# Patient Record
Sex: Female | Born: 1935 | Race: White | Hispanic: No | Marital: Married | Smoking: Former smoker
Health system: Southern US, Community
[De-identification: ages and names within clinical notes are randomized; demographics above are authoritative.]

## PROBLEM LIST (undated history)

## (undated) DIAGNOSIS — E785 Hyperlipidemia, unspecified: Secondary | ICD-10-CM

## (undated) DIAGNOSIS — A422 Cervicofacial actinomycosis: Secondary | ICD-10-CM

## (undated) DIAGNOSIS — Z9289 Personal history of other medical treatment: Secondary | ICD-10-CM

## (undated) DIAGNOSIS — J449 Chronic obstructive pulmonary disease, unspecified: Secondary | ICD-10-CM

## (undated) DIAGNOSIS — I2510 Atherosclerotic heart disease of native coronary artery without angina pectoris: Secondary | ICD-10-CM

## (undated) DIAGNOSIS — I739 Peripheral vascular disease, unspecified: Secondary | ICD-10-CM

## (undated) DIAGNOSIS — I10 Essential (primary) hypertension: Secondary | ICD-10-CM

## (undated) HISTORY — DX: Chronic obstructive pulmonary disease, unspecified: J44.9

## (undated) HISTORY — DX: Hyperlipidemia, unspecified: E78.5

## (undated) HISTORY — DX: Cervicofacial actinomycosis: A42.2

## (undated) HISTORY — DX: Atherosclerotic heart disease of native coronary artery without angina pectoris: I25.10

## (undated) HISTORY — PX: BONE GRAFT HIP ILIAC CREST: SUR159

## (undated) HISTORY — PX: APPENDECTOMY: SHX54

## (undated) HISTORY — DX: Personal history of other medical treatment: Z92.89

## (undated) HISTORY — DX: Peripheral vascular disease, unspecified: I73.9

## (undated) HISTORY — DX: Essential (primary) hypertension: I10

## (undated) HISTORY — PX: TONSILLECTOMY: SUR1361

---

## 2004-03-03 ENCOUNTER — Encounter: Admission: RE | Admit: 2004-03-03 | Discharge: 2004-03-03 | Payer: Self-pay | Admitting: General Surgery

## 2005-02-12 ENCOUNTER — Emergency Department (HOSPITAL_COMMUNITY): Admission: EM | Admit: 2005-02-12 | Discharge: 2005-02-12 | Payer: Self-pay | Admitting: Family Medicine

## 2005-02-18 ENCOUNTER — Encounter: Admission: RE | Admit: 2005-02-18 | Discharge: 2005-02-18 | Payer: Self-pay | Admitting: Urology

## 2005-02-23 ENCOUNTER — Encounter (INDEPENDENT_AMBULATORY_CARE_PROVIDER_SITE_OTHER): Payer: Self-pay | Admitting: *Deleted

## 2005-02-23 ENCOUNTER — Ambulatory Visit (HOSPITAL_COMMUNITY): Admission: RE | Admit: 2005-02-23 | Discharge: 2005-02-23 | Payer: Self-pay | Admitting: Urology

## 2005-02-23 ENCOUNTER — Ambulatory Visit (HOSPITAL_BASED_OUTPATIENT_CLINIC_OR_DEPARTMENT_OTHER): Admission: RE | Admit: 2005-02-23 | Discharge: 2005-02-23 | Payer: Self-pay | Admitting: Urology

## 2005-07-01 ENCOUNTER — Encounter: Admission: RE | Admit: 2005-07-01 | Discharge: 2005-07-01 | Payer: Self-pay | Admitting: Obstetrics and Gynecology

## 2006-11-22 ENCOUNTER — Encounter: Payer: Self-pay | Admitting: Infectious Diseases

## 2006-11-25 ENCOUNTER — Encounter: Admission: RE | Admit: 2006-11-25 | Discharge: 2006-11-25 | Payer: Self-pay | Admitting: Oral Surgery

## 2006-11-27 ENCOUNTER — Encounter: Payer: Self-pay | Admitting: Infectious Diseases

## 2006-12-01 ENCOUNTER — Ambulatory Visit: Payer: Self-pay | Admitting: Infectious Diseases

## 2006-12-01 DIAGNOSIS — J438 Other emphysema: Secondary | ICD-10-CM

## 2006-12-01 DIAGNOSIS — A422 Cervicofacial actinomycosis: Secondary | ICD-10-CM

## 2006-12-05 ENCOUNTER — Ambulatory Visit (HOSPITAL_COMMUNITY): Admission: RE | Admit: 2006-12-05 | Discharge: 2006-12-05 | Payer: Self-pay | Admitting: Infectious Diseases

## 2006-12-11 ENCOUNTER — Encounter: Payer: Self-pay | Admitting: Infectious Diseases

## 2006-12-12 ENCOUNTER — Telehealth: Payer: Self-pay | Admitting: Infectious Diseases

## 2006-12-12 DIAGNOSIS — B373 Candidiasis of vulva and vagina: Secondary | ICD-10-CM

## 2006-12-15 ENCOUNTER — Ambulatory Visit: Payer: Self-pay | Admitting: Infectious Diseases

## 2007-01-09 ENCOUNTER — Telehealth: Payer: Self-pay | Admitting: Infectious Diseases

## 2007-01-09 DIAGNOSIS — F419 Anxiety disorder, unspecified: Secondary | ICD-10-CM | POA: Insufficient documentation

## 2007-01-22 ENCOUNTER — Ambulatory Visit: Payer: Self-pay | Admitting: Infectious Diseases

## 2007-02-26 ENCOUNTER — Telehealth: Payer: Self-pay | Admitting: Infectious Diseases

## 2007-05-23 ENCOUNTER — Ambulatory Visit: Payer: Self-pay | Admitting: Infectious Diseases

## 2007-09-08 DIAGNOSIS — I748 Embolism and thrombosis of other arteries: Secondary | ICD-10-CM | POA: Insufficient documentation

## 2007-09-27 ENCOUNTER — Ambulatory Visit: Payer: Self-pay | Admitting: Cardiology

## 2007-09-27 LAB — CONVERTED CEMR LAB
BUN: 7 mg/dL (ref 6–23)
CO2: 30 meq/L (ref 19–32)
Calcium: 9.9 mg/dL (ref 8.4–10.5)
Chloride: 99 meq/L (ref 96–112)
Creatinine, Ser: 0.6 mg/dL (ref 0.4–1.2)
GFR calc Af Amer: 127 mL/min
GFR calc non Af Amer: 105 mL/min
Glucose, Bld: 102 mg/dL — ABNORMAL HIGH (ref 70–99)
Potassium: 4.5 meq/L (ref 3.5–5.1)
Sodium: 139 meq/L (ref 135–145)

## 2007-10-05 ENCOUNTER — Ambulatory Visit: Payer: Self-pay

## 2007-10-05 ENCOUNTER — Ambulatory Visit: Payer: Self-pay | Admitting: Cardiology

## 2007-10-09 ENCOUNTER — Ambulatory Visit: Payer: Self-pay | Admitting: Cardiology

## 2008-09-29 DIAGNOSIS — I739 Peripheral vascular disease, unspecified: Secondary | ICD-10-CM

## 2008-10-01 ENCOUNTER — Encounter: Payer: Self-pay | Admitting: Cardiology

## 2008-10-01 ENCOUNTER — Ambulatory Visit: Payer: Self-pay | Admitting: Cardiology

## 2008-10-23 ENCOUNTER — Ambulatory Visit: Payer: Self-pay

## 2008-12-16 ENCOUNTER — Encounter: Payer: Self-pay | Admitting: Cardiology

## 2008-12-16 LAB — CONVERTED CEMR LAB
ALT: 22 units/L
AST: 29 units/L
Cholesterol: 213 mg/dL
HDL: 62 mg/dL
LDL Cholesterol: 116 mg/dL
Triglyceride fasting, serum: 166 mg/dL

## 2009-01-19 ENCOUNTER — Encounter: Payer: Self-pay | Admitting: Cardiology

## 2009-07-29 ENCOUNTER — Telehealth (INDEPENDENT_AMBULATORY_CARE_PROVIDER_SITE_OTHER): Payer: Self-pay | Admitting: *Deleted

## 2009-09-22 ENCOUNTER — Ambulatory Visit: Payer: Self-pay | Admitting: Cardiology

## 2009-10-16 ENCOUNTER — Inpatient Hospital Stay (HOSPITAL_COMMUNITY): Admission: EM | Admit: 2009-10-16 | Discharge: 2009-10-19 | Payer: Self-pay | Admitting: Emergency Medicine

## 2009-10-16 ENCOUNTER — Emergency Department (HOSPITAL_COMMUNITY): Admission: EM | Admit: 2009-10-16 | Discharge: 2009-10-16 | Payer: Self-pay | Admitting: Emergency Medicine

## 2009-10-16 ENCOUNTER — Encounter (INDEPENDENT_AMBULATORY_CARE_PROVIDER_SITE_OTHER): Payer: Self-pay

## 2010-05-14 ENCOUNTER — Encounter: Payer: Self-pay | Admitting: Internal Medicine

## 2010-05-14 ENCOUNTER — Emergency Department (HOSPITAL_COMMUNITY): Admission: EM | Admit: 2010-05-14 | Discharge: 2010-05-14 | Payer: Self-pay | Admitting: Family Medicine

## 2010-06-15 ENCOUNTER — Ambulatory Visit: Payer: Self-pay | Admitting: Internal Medicine

## 2010-06-15 DIAGNOSIS — R0989 Other specified symptoms and signs involving the circulatory and respiratory systems: Secondary | ICD-10-CM

## 2010-06-15 DIAGNOSIS — R0609 Other forms of dyspnea: Secondary | ICD-10-CM | POA: Insufficient documentation

## 2010-06-15 DIAGNOSIS — J449 Chronic obstructive pulmonary disease, unspecified: Secondary | ICD-10-CM | POA: Insufficient documentation

## 2010-06-16 LAB — CONVERTED CEMR LAB
BUN: 8 mg/dL (ref 6–23)
Basophils Absolute: 0.2 10*3/uL — ABNORMAL HIGH (ref 0.0–0.1)
Basophils Relative: 2 % (ref 0.0–3.0)
CO2: 31 meq/L (ref 19–32)
Calcium: 9.7 mg/dL (ref 8.4–10.5)
Chloride: 99 meq/L (ref 96–112)
Creatinine, Ser: 0.6 mg/dL (ref 0.4–1.2)
Eosinophils Absolute: 0.1 10*3/uL (ref 0.0–0.7)
Eosinophils Relative: 1.4 % (ref 0.0–5.0)
GFR calc non Af Amer: 110.49 mL/min (ref >60.00)
Glucose, Bld: 103 mg/dL — ABNORMAL HIGH (ref 70–99)
HCT: 39.9 % (ref 36.0–46.0)
Hemoglobin: 13.6 g/dL (ref 12.0–15.0)
Lymphocytes Relative: 24.8 % (ref 12.0–46.0)
Lymphs Abs: 1.9 10*3/uL (ref 0.7–4.0)
MCHC: 34 g/dL (ref 30.0–36.0)
MCV: 95.7 fL (ref 78.0–100.0)
Monocytes Absolute: 0.7 10*3/uL (ref 0.1–1.0)
Monocytes Relative: 9.3 % (ref 3.0–12.0)
Neutro Abs: 4.7 10*3/uL (ref 1.4–7.7)
Neutrophils Relative %: 62.5 % (ref 43.0–77.0)
Platelets: 390 10*3/uL (ref 150.0–400.0)
Potassium: 4.7 meq/L (ref 3.5–5.1)
Pro B Natriuretic peptide (BNP): 54 pg/mL (ref 0.0–100.0)
RBC: 4.17 M/uL (ref 3.87–5.11)
RDW: 12.5 % (ref 11.5–14.6)
Sodium: 139 meq/L (ref 135–145)
TSH: 1.99 microintl units/mL (ref 0.35–5.50)
WBC: 7.5 10*3/uL (ref 4.5–10.5)

## 2010-06-24 ENCOUNTER — Telehealth: Payer: Self-pay | Admitting: Internal Medicine

## 2010-06-29 ENCOUNTER — Ambulatory Visit: Payer: Self-pay | Admitting: Internal Medicine

## 2010-08-01 ENCOUNTER — Encounter: Payer: Self-pay | Admitting: Family Medicine

## 2010-08-05 ENCOUNTER — Ambulatory Visit
Admission: RE | Admit: 2010-08-05 | Discharge: 2010-08-05 | Payer: Self-pay | Source: Home / Self Care | Attending: Internal Medicine | Admitting: Internal Medicine

## 2010-08-05 ENCOUNTER — Encounter: Payer: Self-pay | Admitting: Internal Medicine

## 2010-08-10 NOTE — Assessment & Plan Note (Signed)
Summary: Pulmonary/ new pt eval with walking sats ok x 1 lap only   Visit Type:  Initial Consult Copy to:  Dr. Herb Grays Primary Provider/Referring Provider:  Herb Grays MD  CC:  Dyspnea.  History of Present Illness: 76 yowf quit smoking 2007 with doe with heavy exertion and cough that resolved  with no weight gain but "breathing downhill since then"   June 15, 2010  1st pulmonary office eval cc indolent onset  progressive doe esp over the last month with doe x 50 feet, no sign cough or nocturnal co's. not sure inhalers are helping.  Pt denies any significant sore throat, dysphagia, itching, sneezing,  nasal congestion or excess secretions,  fever, chills, sweats, unintended wt loss, pleuritic or exertional cp, hempoptysis, variability  in activity tolerance  orthopnea pnd or leg swelling Pt also denies any obvious fluctuation in symptoms with weather or environmental change or other alleviating or aggravating factors.       Current Medications (verified): 1)  Multivitamins  Tabs (Multiple Vitamin) .Marland Kitchen.. 1 Tab Once Daily 2)  Metoprolol Tartrate 25 Mg Tabs (Metoprolol Tartrate) .... 1/2 Tab Two Times A Day 3)  Aspirin 81 Mg Tbec (Aspirin) .... Take One Tablet By Mouth Daily 4)  Atrovent Hfa 17 Mcg/act Aers (Ipratropium Bromide Hfa) .... 2 Puffs Two Times A Day 5)  Pravastatin Sodium 40 Mg Tabs (Pravastatin Sodium) .... 2 Tab At Bedtime 6)  Calcium-Vitamin D 500-200 Mg-Unit Tabs (Calcium-Vitamin D) .Marland Kitchen.. 1 Two Times A Day 7)  Vitamin E 400 Unit Caps (Vitamin E) .Marland Kitchen.. 1 Cap Once Daily 8)  Vitamin C 500 Mg  Tabs (Ascorbic Acid) .... 2  Tab Once Daily 9)  Vitamin B Complex-C   Caps (B Complex-C) .Marland Kitchen.. 1 Tab Once Daily 10)  Oxybutynin Chloride 15 Mg Xr24h-Tab (Oxybutynin Chloride) .... As Needed 11)  Lipitor 40 Mg Tabs (Atorvastatin Calcium) .Marland Kitchen.. 1 Once Daily 12)  Fosamax 70 Mg Tabs (Alendronate Sodium) .Marland Kitchen.. 1 Every Week 13)  Symbicort 160-4.5 Mcg/act Aero (Budesonide-Formoterol Fumarate)  .... 2 Puffs Two Times A Day 14)  Proair Hfa 108 (90 Base) Mcg/act Aers (Albuterol Sulfate) .... 2 Puffs Every 4-6 Hrs As Needed 15)  Xopenex Hfa 45 Mcg/act Aero (Levalbuterol Tartrate) .... 2 Puffs Every 4-6 Hrs As Needed 16)  Albuterol Sulfate (2.5 Mg/18ml) 0.083% Nebu (Albuterol Sulfate) .Marland Kitchen.. 1 in Nebulizer Four Times A Day As Needed  Allergies (verified): 1)  ! Pcn 2)  ! * Pneumovax  Past History:  Past Medical History: ABDOMINAL AORTIC ANEURYSM (ICD-441.4) PVD (ICD-443.9) FAMILY HISTORY OF CAD FEMALE 1ST DEGREE RELATIVE <50 (ICD-V17.3) FAMILY HISTORY OF CAD FEMALE 1ST DEGREE RELATIVE <60 (ICD-V16.49) ACTINOMYCOSIS, CERVICOFACIAL (ICD-039.3) EMPHYSEMA NEC (ICD-492.8) CANDIDIASIS, VAGINAL (ICD-112.1) COPD..................................................................................Marland KitchenWert     - HFA 25% June 15, 2010       Past Surgical History: Appendectomy 1951  Family History: Family History of CAD Female 1st degree relative <60 Family History of CAD Female 1st degree relative <50 Emphysema- Father and 3 Sisters (all were smokers) Stomach CA- Sister  Social History: Married Children Former smoker.  Quit in Sept 2007.  Smoked for 48 yrs 1 ppd Retired No ETOH  Review of Systems       The patient complains of shortness of breath with activity and shortness of breath at rest.  The patient denies productive cough, non-productive cough, coughing up blood, chest pain, irregular heartbeats, acid heartburn, indigestion, loss of appetite, weight change, abdominal pain, difficulty swallowing, sore throat, tooth/dental problems, headaches,  nasal congestion/difficulty breathing through nose, sneezing, itching, ear ache, anxiety, depression, hand/feet swelling, joint stiffness or pain, rash, change in color of mucus, and fever.    Vital Signs:  Patient profile:   76 year old female Weight:      128 pounds O2 Sat:      94 % on Room air Temp:     98.1 degrees F oral Pulse  rate:   84 / minute BP sitting:   172 / 90  (left arm)  Vitals Entered By: Vernie Murders (June 15, 2010 3:42 PM)  O2 Flow:  Room air  Serial Vital Signs/Assessments:  Comments: 4:45 PM Ambulatory Pulse Oximetry  Resting; HR__74___    02 Sat_96%ra____  Lap1 (185 feet)   HR__112___   02 Sat__90%ra___ Lap2 (185 feet)   HR_____   02 Sat_____    Lap3 (185 feet)   HR_____   02 Sat_____  ___Test Completed without Difficulty _x__Test Stopped due to: pt c/o increased SOB   By: Vernie Murders    Physical Exam  Additional Exam:  124 > 128 June 15, 2010  full set of dentures, unusual affect  HEENT mild turbinate edema.  Oropharynx no thrush or excess pnd or cobblestoning.  No JVD or cervical adenopathy. Mild accessory muscle hypertrophy. Trachea midline, nl thryroid. Chest was hyperinflated by percussion with diminished breath sounds and moderate increased exp time without wheeze. Hoover sign positive at mid inspiration. Regular rate and rhythm without murmur gallop or rub or increase P2 or edema.  Abd: no hsm, nl excursion. Ext warm without cyanosis or clubbing.     Sodium                    139 mEq/L                   135-145   Potassium                 4.7 mEq/L                   3.5-5.1   Chloride                  99 mEq/L                    96-112   Carbon Dioxide            31 mEq/L                    19-32   Glucose              [H]  103 mg/dL                   16-10   BUN                       8 mg/dL                     9-60   Creatinine                0.6 mg/dL                   4.5-4.0   Calcium                   9.7 mg/dL  8.4-10.5   GFR                       110.49 mL/min               >60.00  Tests: (2) CBC Platelet w/Diff (CBCD)   White Cell Count          7.5 K/uL                    4.5-10.5   Red Cell Count            4.17 Mil/uL                 3.87-5.11   Hemoglobin                13.6 g/dL                   04.5-40.9   Hematocrit                 39.9 %                      36.0-46.0   MCV                       95.7 fl                     78.0-100.0   MCHC                      34.0 g/dL                   81.1-91.4   RDW                       12.5 %                      11.5-14.6   Platelet Count            390.0 K/uL                  150.0-400.0   Neutrophil %              62.5 %                      43.0-77.0   Lymphocyte %              24.8 %                      12.0-46.0   Monocyte %                9.3 %                       3.0-12.0   Eosinophils%              1.4 %                       0.0-5.0   Basophils %               2.0 %                       0.0-3.0   Neutrophill Absolute      4.7  K/uL                    1.4-7.7   Lymphocyte Absolute       1.9 K/uL                    0.7-4.0   Monocyte Absolute         0.7 K/uL                    0.1-1.0  Eosinophils, Absolute                             0.1 K/uL                    0.0-0.7   Basophils Absolute   [H]  0.2 K/uL                    0.0-0.1  Tests: (3) TSH (TSH)   FastTSH                   1.99 uIU/mL                 0.35-5.50  Tests: (4) B-Type Natiuretic Peptide (BNPR)  B-Type Natriuetic Peptide                             54.0 pg/mL                  0.0-100.0  CXR  Procedure date:  05/14/2010  Findings:        COPD without acute diseas  Impression & Recommendations:  Problem # 1:  COPD UNSPECIFIED (ICD-496)  At least moderate severity s/p remote severity  DDX of  difficult airways managment all start with A and  include Adherence, Ace Inhibitors, Acid Reflux, Active Sinus Disease, Alpha 1 Antitripsin deficiency, Anxiety masquerading as Airways dz,  ABPA,  allergy(esp in young), Aspiration (esp in elderly), Adverse effects of DPI,  Active smokers, plus one B  = Beta blocker use..    Adherence always a major obstacle and starts with abiility to use inhalers effectively I spent extra time with the patient today explaining optimal mdi  technique.  This  improved from  10-25% but no better so try change to dpi:  I spent extra time with the patient today explaining optimal dpi  technique.  This improved from  50-90% See instructions for specific recommendations   ? acid reflux:  try off biphosphonates for now   Orders: New Patient Level V (04540)  Medications Added to Medication List This Visit: 1)  Atrovent Hfa 17 Mcg/act Aers (Ipratropium bromide hfa) .... 2 puffs two times a day 2)  Calcium-vitamin D 500-200 Mg-unit Tabs (Calcium-vitamin d) .Marland Kitchen.. 1 two times a day 3)  Lipitor 40 Mg Tabs (Atorvastatin calcium) .Marland Kitchen.. 1 once daily 4)  Fosamax 70 Mg Tabs (Alendronate sodium) .Marland Kitchen.. 1 every week 5)  Symbicort 160-4.5 Mcg/act Aero (Budesonide-formoterol fumarate) .... 2 puffs two times a day 6)  Symbicort 160-4.5 Mcg/act Aero (Budesonide-formoterol fumarate) .... 2 puffs two times a day or advair one twice daily 7)  Spiriva Handihaler 18 Mcg Caps (Tiotropium bromide monohydrate) .... Two puffs in handihaler daily 8)  Advair Diskus 250-50 Mcg/dose Misc (Fluticasone-salmeterol) .... One puff twice daily 9)  Proair Hfa 108 (90 Base) Mcg/act Aers (Albuterol sulfate) .... 2 puffs  every 4-6 hrs as needed 10)  Albuterol Sulfate (2.5 Mg/61ml) 0.083% Nebu (Albuterol sulfate) .Marland Kitchen.. 1 in nebulizer four times a day as needed 11)  Xopenex Hfa 45 Mcg/act Aero (Levalbuterol tartrate) .... 2 puffs every 4-6 hrs as needed 12)  Albuterol Sulfate (2.5 Mg/33ml) 0.083% Nebu (Albuterol sulfate) .Marland Kitchen.. 1 in nebulizer every 4 hours as needed  Other Orders: TLB-BMP (Basic Metabolic Panel-BMET) (80048-METABOL) TLB-CBC Platelet - w/Differential (85025-CBCD) TLB-TSH (Thyroid Stimulating Hormone) (84443-TSH) TLB-BNP (B-Natriuretic Peptide) (83880-BNPR) Pulse Oximetry, Ambulatory (16109)  Patient Instructions: 1)  Stop fosmax 2)  Try advair one puff twice daily instead of symbort 3)  Work on inhaler technique:  relax and blow all the way out then take a nice smooth deep  breath back in, triggering the inhaler at same time you start breathing in and hold a few seconds 4)  Please schedule a follow-up appointment in 2  weeks, sooner if needed

## 2010-08-10 NOTE — Progress Notes (Signed)
  Returned paperwork to [employer].. w/ chart ( [facility]) [place]  July 29, 2009 8:11 AM

## 2010-08-10 NOTE — Assessment & Plan Note (Signed)
Summary: yearly/sl   Visit Type:  1 yr f/u Primary Provider:  Herb Grays MD  CC:  sob....denies any cp or edema.  History of Present Illness: [name] comes in today for followup, evaluation and management of her peripheral vascular disease.  The lashes ultrasound, she not only did not have a clot but had no evidence of an aneurysm. She does have some mild atherosclerosis.  She denies any claudication, ulcerations, cold feet, cold toes. She has had no rashes.    Current Medications (verified): 1)  Multivitamins  Tabs (Multiple Vitamin) .Marland Kitchen.. 1 Tab Once Daily 2)  Metoprolol Tartrate 25 Mg Tabs (Metoprolol Tartrate) .... 1/2 Tab Two Times A Day 3)  Aspirin 81 Mg Tbec (Aspirin) .... Take One Tablet By Mouth Daily 4)  Caltrate 600 1500 Mg Tabs (Calcium Carbonate) .Marland Kitchen.. 1 Tab Once Daily 5)  Atrovent Hfa 17 Mcg/act Aers (Ipratropium Bromide Hfa) .... 2 Puffs Four Times Daily 6)  Ventolin Hfa 108 (90 Base) Mcg/act Aers (Albuterol Sulfate) .... 2 Puffs Four Times Daily 7)  Pravastatin Sodium 40 Mg Tabs (Pravastatin Sodium) .... 2 Tab At Bedtime 8)  Vitamin D 1000 Unit Tabs (Cholecalciferol) .Marland Kitchen.. 1 Tab Once Daily 9)  Vitamin E 400 Unit Caps (Vitamin E) .Marland Kitchen.. 1 Cap Once Daily 10)  Co Q-10 30 Mg  Caps (Coenzyme Q10) .Marland Kitchen.. 1 Tab Once Daily 11)  Vitamin C 500 Mg  Tabs (Ascorbic Acid) .... 2  Tab Once Daily 12)  Vitamin B Complex-C   Caps (B Complex-C) .Marland Kitchen.. 1 Tab Once Daily 13)  Oxybutynin Chloride 15 Mg Xr24h-Tab (Oxybutynin Chloride) .... As Needed  Allergies: 1)  ! Pcn  Past History:  Past Medical History: Last updated: 09/18/2009 ABDOMINAL AORTIC ANEURYSM (ICD-441.4) PVD (ICD-443.9) FAMILY HISTORY OF CAD FEMALE 1ST DEGREE RELATIVE <50 (ICD-V17.3) FAMILY HISTORY OF CAD FEMALE 1ST DEGREE RELATIVE <60 (ICD-V16.49) ACTINOMYCOSIS, CERVICOFACIAL (ICD-039.3) EMPHYSEMA NEC (ICD-492.8) CANDIDIASIS, VAGINAL (ICD-112.1)      Family History: Last updated: 12/01/2006 Family History of CAD  Female 1st degree relative <60 Family History of CAD Female 1st degree relative <50 father with COPD. Sister with stomach cancer  Social History: Last updated: 12/01/2006 Former Smoker Alcohol use-no  Risk Factors: Smoking Status: quit (12/01/2006)  Review of Systems       negative other than history of present illness  Vital Signs:  Patient profile:   76 year old female Height:      61 inches Weight:      124 pounds BMI:     23.51 Pulse rate:   66 / minute Pulse rhythm:   irregular BP sitting:   122 / 80  (left arm) Cuff size:   large  Vitals Entered By: Danielle Rankin, CMA (September 22, 2009 12:21 PM)  Physical Exam  General:  Well developed, well nourished, in no acute distress. Head:  normocephalic and atraumatic Eyes:  PERRLA/EOM intact; conjunctiva and lids normal. Neck:  Neck supple, no JVD. No masses, thyromegaly or abnormal cervical nodes. Chest Irelynn Schermerhorn:  no deformities or breast masses noted Lungs:  Clear bilaterally to auscultation and percussion. Heart:  Non-displaced PMI, chest non-tender; regular rate and rhythm, S1, S2 without murmurs, rubs or gallops. Carotid upstroke normal, no bruit. Normal abdominal aortic size, no bruits. Femorals normal pulses, no bruits. Pedals normal pulses. No edema, no varicosities. Abdomen:  Bowel sounds positive; abdomen soft and non-tender without masses, organomegaly, or hernias noted. No hepatosplenomegaly. Msk:  Back normal, normal gait. Muscle strength and tone normal. Pulses:  pulses  normal in all 4 extremities Extremities:  No clubbing or cyanosis. Neurologic:  Alert and oriented x 3. Skin:  Intact without lesions or rashes. Psych:  Normal affect.   Impression & Recommendations:  Problem # 1:  PVD (ICD-443.9) Assessment Unchanged Since there was no evidence of an aneurysm on last years ultrasound, I will see her back p.r.n. I've explained to her that she does have some mild atherosclerosis and have emphasized secondary  prevention strategies. I would recommend an LDL of less than 100 and hopefully closer to 70. Her insurance is refusing to pay for 80 mg of pravastatin. She may need to switch back to brand name. I asked her to follow with her primary care.  Other Orders: EKG w/ Interpretation (93000)  Patient Instructions: 1)  Your physician recommends that you schedule a follow-up appointment in: AS NEEDED 2)  Your physician recommends that you continue on your current medications as directed. Please refer to the Current Medication list given to you today.

## 2010-08-12 NOTE — Assessment & Plan Note (Signed)
Summary: Pulmonary/ ext summary f/u ov   Copy to:  Dr. Herb Grays Primary Provider/Referring Provider:  Herb Grays MD  CC:  Dyspnea- some improved.  History of Present Illness: 65 yowf quit smoking 2007 with doe with heavy exertion and cough that resolved  with no weight gain but "breathing downhill since then"   June 15, 2010  1st pulmonary office eval cc indolent onset  progressive doe esp over the last month with doe x 50 feet, no sign cough or nocturnal co's. not sure inhalers are helping but not able to use hfa so try advair could not tol so back on symbicort 160.   June 29, 2010 ov cc doe no change, minimal cough. Work on Musician technique:   Start Spiriva one capsule each am right after symbicort 160 2 puffs first thing  in am and 2 puffs again in pm about 12 hours l  August 05, 2010 ov better on spiriva,  now doing 5 min x 2 mph and some tilt. no cough. Pt denies any significant sore throat, dysphagia, itching, sneezing,  nasal congestion or excess secretions,  fever, chills, sweats, unintended wt loss, pleuritic or exertional cp, hempoptysis, change in activity tolerance  orthopnea pnd or leg swelling Pt also denies any obvious fluctuation in symptoms with weather or environmental change or other alleviating or aggravating factors.      Pt denies any increase in rescue therapy(hfa albuterol) over baseline, denies waking up needing it or having early am exacerbations of coughing/wheezing/ or dyspnea  not using neb at all    Current Medications (verified): 1)  Multivitamins  Tabs (Multiple Vitamin) .Marland Kitchen.. 1 Tab Once Daily 2)  Metoprolol Tartrate 25 Mg Tabs (Metoprolol Tartrate) .... 1/2 Tab Two Times A Day 3)  Aspirin 81 Mg Tbec (Aspirin) .... Take One Tablet By Mouth Daily 4)  Lipitor 40 Mg Tabs (Atorvastatin Calcium) .Marland Kitchen.. 1 Once Daily 5)  Calcium-Vitamin D 500-200 Mg-Unit Tabs (Calcium-Vitamin D) .Marland Kitchen.. 1 Two Times A Day 6)  Vitamin E 400 Unit Caps (Vitamin E)  .Marland Kitchen.. 1 Cap Once Daily 7)  Vitamin C 500 Mg  Tabs (Ascorbic Acid) .... 2  Tab Once Daily 8)  Vitamin B Complex-C   Caps (B Complex-C) .Marland Kitchen.. 1 Tab Once Daily 9)  Symbicort 160-4.5 Mcg/act Aero (Budesonide-Formoterol Fumarate) .... 2 Puffs Two Times A Day 10)  Spiriva Handihaler 18 Mcg  Caps (Tiotropium Bromide Monohydrate) .... Two Puffs in Handihaler Daily 11)  Oxybutynin Chloride 15 Mg Xr24h-Tab (Oxybutynin Chloride) .... As Needed 12)  Lipitor 40 Mg Tabs (Atorvastatin Calcium) .Marland Kitchen.. 1 Once Daily 13)  Ventolin Hfa 108 (90 Base) Mcg/act Aers (Albuterol Sulfate) .... 2 Puffs Every 4-6 Hrs As Needed 14)  Albuterol Sulfate (2.5 Mg/75ml) 0.083% Nebu (Albuterol Sulfate) .Marland Kitchen.. 1 in Nebulizer Every 4 Hours As Needed  Allergies (verified): 1)  ! Pcn 2)  ! * Pneumovax  Past History:  Past Medical History: ABDOMINAL AORTIC ANEURYSM (ICD-441.4) PVD (ICD-443.9) FAMILY HISTORY OF CAD FEMALE 1ST DEGREE RELATIVE <50 (ICD-V17.3) FAMILY HISTORY OF CAD FEMALE 1ST DEGREE RELATIVE <60 (ICD-V16.49) ACTINOMYCOSIS, CERVICOFACIAL (ICD-039.3) EMPHYSEMA NEC (ICD-492.8) CANDIDIASIS, VAGINAL (ICD-112.1) COPD..................................................................................Marland KitchenWert     - HFA 25% June 15, 2010 >  75% June 29, 2010  > 90% August 05, 2010      - PFT's  08/05/10  FEV1  .57(34%) with ratio 33 no better after B2 and DLC0 50 and no desat x one lap       Vital Signs:  Patient profile:   76 year old female Weight:      127 pounds O2 Sat:      96 % on Room air Temp:     97.5 degrees F oral Pulse rate:   74 / minute BP sitting:   142 / 86  (left arm)  Vitals Entered By: Vernie Murders (August 05, 2010 10:52 AM)  O2 Flow:  Room air  Physical Exam  Additional Exam:  124 > 128 June 15, 2010 > 125 June 29, 2010 > 127 August 05, 2010  full set of dentures, unusual affect  HEENT mild turbinate edema.  Oropharynx no thrush or excess pnd or cobblestoning.  No JVD or cervical  adenopathy. Mild accessory muscle hypertrophy. Trachea midline, nl thryroid. Chest was hyperinflated by percussion with diminished breath sounds and moderate increased exp time without wheeze. Hoover sign positive at mid inspiration. Regular rate and rhythm without murmur gallop or rub or increase P2 or edema.  Abd: no hsm, nl excursion. Ext warm without cyanosis or clubbing.     Impression & Recommendations:  Problem # 1:  COPD UNSPECIFIED (ICD-496) GOLD III with no ex desat walking so goo candidate for self rehab but needs to learn to pace herself  Better on spiriva, will continue  I spent extra time with the patient today explaining optimal mdi  technique.  This improved from  75-90%  Other Orders: Est. Patient Level IV (40981)  Patient Instructions: 1)  Excercise should paced at a level where you can maintain it for 20 minutes  2)  Continue spiriva and symbicort daily 3)  Return to office in 3 months, sooner if needed  Prescriptions: VENTOLIN HFA 108 (90 BASE) MCG/ACT AERS (ALBUTEROL SULFATE) 2 puffs every 4-6 hrs as needed  #1 x 1   Entered by:   Vernie Murders   Authorized by:   Nyoka Cowden MD   Signed by:   Vernie Murders on 08/05/2010   Method used:   Electronically to        [facility] [id number] (retail)       [address] [place], [place]  [address]       Ph: [phone]       Fax: [phone]   RxID:   [id number] SPIRIVA HANDIHALER 18 MCG  CAPS (TIOTROPIUM BROMIDE MONOHYDRATE) Two puffs in handihaler daily  #34 x 11   Entered and Authorized by:   Nyoka Cowden MD   Signed by:   Nyoka Cowden MD on 08/05/2010   Method used:   Electronically to        [facility] [id number] (retail)       [address] [place], [place]  [address]       Ph: [phone]       Fax: [phone]   RxID:   [id number]

## 2010-08-12 NOTE — Progress Notes (Signed)
Summary: thrush > clotrimazole  Phone Note Call from Patient Call back at Home Phone [phone]   Caller: Patient Call For: wert Summary of Call: pt was seen 12/6. has been using advair. today c/o thrush. [facility] [place] Initial call taken by: Tivis Ringer, CNA,  June 24, 2010 9:55 AM  Follow-up for Phone Call        Pt is c/o having white patches on inside of her cheeks and the back of her throat. She states she is rinsing after each useof advair. Please advise. Carron Curie CMA  June 24, 2010 10:34 AM  clotrimazole troche 10 mg four times daily as needed (#16) and change back to symicort until next ov  Follow-up by: Nyoka Cowden MD,  June 24, 2010 11:49 AM  Additional Follow-up for Phone Call Additional follow up Details #1::        pt advised of recs and rx sent.Carron Curie CMA  June 24, 2010 12:39 PM     New/Updated Medications: CLOTRIMAZOLE 10 MG TROC (CLOTRIMAZOLE) four times a day as needed Prescriptions: CLOTRIMAZOLE 10 MG TROC (CLOTRIMAZOLE) four times a day as needed  #16 x 0   Entered by:   Carron Curie CMA   Authorized by:   Nyoka Cowden MD   Signed by:   Carron Curie CMA on 06/24/2010   Method used:   Electronically to        [facility] [phone]* (retail)       [address] [place], [place]  [address]       Ph: [phone]       Fax: [phone]   RxID:   [id number]

## 2010-08-12 NOTE — Assessment & Plan Note (Signed)
Summary: Pulmonary/ ext ov with hfa now 75% add spiriva   Copy to:  Dr. Herb Grays Primary Provider/Referring Provider:  Herb Grays MD  CC:  Dyspnea- the same.  History of Present Illness: 35 yowf quit smoking 2007 with doe with heavy exertion and cough that resolved  with no weight gain but "breathing downhill since then"   June 15, 2010  1st pulmonary office eval cc indolent onset  progressive doe esp over the last month with doe x 50 feet, no sign cough or nocturnal co's. not sure inhalers are helping but not able to use hfa so try advair could not tol so back on symbicort 160.   June 29, 2010 ov cc doe no change, minimla cough. Pt denies any significant sore throat, dysphagia, itching, sneezing,  nasal congestion or excess secretions,  fever, chills, sweats, unintended wt loss, pleuritic or exertional cp, hempoptysis, change in activity tolerance  orthopnea pnd or leg swelling.  Pt also denies any obvious fluctuation in symptoms with weather or environmental change or other alleviating or aggravating factors.       Current Medications (verified): 1)  Multivitamins  Tabs (Multiple Vitamin) .Marland Kitchen.. 1 Tab Once Daily 2)  Metoprolol Tartrate 25 Mg Tabs (Metoprolol Tartrate) .... 1/2 Tab Two Times A Day 3)  Aspirin 81 Mg Tbec (Aspirin) .... Take One Tablet By Mouth Daily 4)  Lipitor 40 Mg Tabs (Atorvastatin Calcium) .Marland Kitchen.. 1 Once Daily 5)  Calcium-Vitamin D 500-200 Mg-Unit Tabs (Calcium-Vitamin D) .Marland Kitchen.. 1 Two Times A Day 6)  Vitamin E 400 Unit Caps (Vitamin E) .Marland Kitchen.. 1 Cap Once Daily 7)  Vitamin C 500 Mg  Tabs (Ascorbic Acid) .... 2  Tab Once Daily 8)  Vitamin B Complex-C   Caps (B Complex-C) .Marland Kitchen.. 1 Tab Once Daily 9)  Oxybutynin Chloride 15 Mg Xr24h-Tab (Oxybutynin Chloride) .... As Needed 10)  Lipitor 40 Mg Tabs (Atorvastatin Calcium) .Marland Kitchen.. 1 Once Daily 11)  Ventolin Hfa 108 (90 Base) Mcg/act Aers (Albuterol Sulfate) .... 2 Puffs Every 4-6 Hrs As Needed 12)  Xopenex Hfa 45 Mcg/act Aero  (Levalbuterol Tartrate) .... 2 Puffs Every 4-6 Hrs As Needed 13)  Albuterol Sulfate (2.5 Mg/20ml) 0.083% Nebu (Albuterol Sulfate) .Marland Kitchen.. 1 in Nebulizer Every 4 Hours As Needed 14)  Symbicort 160-4.5 Mcg/act Aero (Budesonide-Formoterol Fumarate) .... 2 Puffs Two Times A Day  Allergies (verified): 1)  ! Pcn 2)  ! * Pneumovax  Past History:  Past Medical History: ABDOMINAL AORTIC ANEURYSM (ICD-441.4) PVD (ICD-443.9) FAMILY HISTORY OF CAD FEMALE 1ST DEGREE RELATIVE <50 (ICD-V17.3) FAMILY HISTORY OF CAD FEMALE 1ST DEGREE RELATIVE <60 (ICD-V16.49) ACTINOMYCOSIS, CERVICOFACIAL (ICD-039.3) EMPHYSEMA NEC (ICD-492.8) CANDIDIASIS, VAGINAL (ICD-112.1) COPD..................................................................................Marland KitchenWert     - HFA 25% June 15, 2010 >  75% June 29, 2010       Vital Signs:  Patient profile:   76 year old female Weight:      125 pounds BMI:     23.70 O2 Sat:      95 % on Room air Temp:     97.5 degrees F oral Pulse rate:   77 / minute BP sitting:   126 / 74  (left arm)  Vitals Entered By: Vernie Murders (June 29, 2010 10:58 AM)  O2 Flow:  Room air  Physical Exam  Additional Exam:  124 > 128 June 15, 2010 > 125 June 29, 2010  full set of dentures, unusual affect  HEENT mild turbinate edema.  Oropharynx no thrush or excess pnd or cobblestoning.  No JVD or cervical adenopathy. Mild accessory muscle hypertrophy. Trachea midline, nl thryroid. Chest was hyperinflated by percussion with diminished breath sounds and moderate increased exp time without wheeze. Hoover sign positive at mid inspiration. Regular rate and rhythm without murmur gallop or rub or increase P2 or edema.  Abd: no hsm, nl excursion. Ext warm without cyanosis or clubbing.     Impression & Recommendations:  Problem # 1:  COPD UNSPECIFIED (ICD-496) At least moderate severity s/p remote smoking cessation  DDX of  difficult airways managment all start with A and  include  Adherence, Ace Inhibitors, Acid Reflux, Active Sinus Disease, Alpha 1 Antitripsin deficiency, Anxiety masquerading as Airways dz,  ABPA,  allergy(esp in young), Aspiration (esp in elderly), Adverse effects of DPI,  Active smokers, plus one B  = Beta blocker use..    Adherence always a major obstacle and starts with abiility to use inhalers effectively I spent extra time with the patient today explaining optimal mdi  technique.  This improved from  25-75% with coaching See instructions for specific recommendations   ? acid reflux: remain  off biphosphonates for now   Add spiriva trial then return for pfts  Medications Added to Medication List This Visit: 1)  Lipitor 40 Mg Tabs (Atorvastatin calcium) .Marland Kitchen.. 1 once daily 2)  Symbicort 160-4.5 Mcg/act Aero (Budesonide-formoterol fumarate) .... 2 puffs two times a day 3)  Spiriva Handihaler 18 Mcg Caps (Tiotropium bromide monohydrate) .... Two puffs in handihaler daily 4)  Ventolin Hfa 108 (90 Base) Mcg/act Aers (Albuterol sulfate) .... 2 puffs every 4-6 hrs as needed  Other Orders: Est. Patient Level IV (60454)  Patient Instructions: 1)  Work on perfecting  inhaler technique:  relax and blow all the way out then take a nice smooth deep breath back in, triggering the inhaler at same time you start breathing in  2)  Start Spiriva one capsule each am right after symbicort 160 2 puffs first thing  in am and 2 puffs again in pm about 12 hours later  3)  I think of spiriva in this setting like purchasing high octane fuel for an older car with lots of miles on it. It may help the perfomance enough to warrant the purchase, but it won't change the longevity of the car or make it any easier parking it. It should improve peak performance  4)  Please schedule a follow-up appointment in 4 weeks, sooner if needed with pft

## 2010-09-29 LAB — COMPREHENSIVE METABOLIC PANEL
ALT: 21 U/L (ref 0–35)
AST: 22 U/L (ref 0–37)
Albumin: 4 g/dL (ref 3.5–5.2)
Alkaline Phosphatase: 43 U/L (ref 39–117)
BUN: 10 mg/dL (ref 6–23)
CO2: 26 mEq/L (ref 19–32)
Calcium: 8.9 mg/dL (ref 8.4–10.5)
Chloride: 100 mEq/L (ref 96–112)
Creatinine, Ser: 0.7 mg/dL (ref 0.4–1.2)
GFR calc Af Amer: >60 mL/min (ref >60)
GFR calc non Af Amer: >60 mL/min (ref >60)
Glucose, Bld: 134 mg/dL — ABNORMAL HIGH (ref 70–99)
Potassium: 3.7 mEq/L (ref 3.5–5.1)
Sodium: 137 mEq/L (ref 135–145)
Total Bilirubin: 0.6 mg/dL (ref 0.3–1.2)
Total Protein: 6.3 g/dL (ref 6.0–8.3)

## 2010-09-29 LAB — CBC
HCT: 29.3 % — ABNORMAL LOW (ref 36.0–46.0)
HCT: 32.2 % — ABNORMAL LOW (ref 36.0–46.0)
HCT: 35.9 % — ABNORMAL LOW (ref 36.0–46.0)
HCT: 36.8 % (ref 36.0–46.0)
Hemoglobin: 10 g/dL — ABNORMAL LOW (ref 12.0–15.0)
Hemoglobin: 10.3 g/dL — ABNORMAL LOW (ref 12.0–15.0)
Hemoglobin: 12.4 g/dL (ref 12.0–15.0)
Hemoglobin: 12.6 g/dL (ref 12.0–15.0)
MCHC: 34 g/dL (ref 30.0–36.0)
MCHC: 34.1 g/dL (ref 30.0–36.0)
MCHC: 34.2 g/dL (ref 30.0–36.0)
MCHC: 34.6 g/dL (ref 30.0–36.0)
MCHC: 34.9 g/dL (ref 30.0–36.0)
MCV: 94.2 fL (ref 78.0–100.0)
MCV: 94.2 fL (ref 78.0–100.0)
MCV: 94.8 fL (ref 78.0–100.0)
MCV: 95.1 fL (ref 78.0–100.0)
Platelets: 258 10*3/uL (ref 150–400)
Platelets: 276 10*3/uL (ref 150–400)
Platelets: 276 10*3/uL (ref 150–400)
Platelets: 321 10*3/uL (ref 150–400)
Platelets: 335 10*3/uL (ref 150–400)
RBC: 3.81 MIL/uL — ABNORMAL LOW (ref 3.87–5.11)
RBC: 3.91 MIL/uL (ref 3.87–5.11)
RDW: 12.2 % (ref 11.5–15.5)
RDW: 12.3 % (ref 11.5–15.5)
RDW: 12.4 % (ref 11.5–15.5)
RDW: 12.5 % (ref 11.5–15.5)
RDW: 12.7 % (ref 11.5–15.5)
WBC: 10.1 10*3/uL (ref 4.0–10.5)
WBC: 10.9 10*3/uL — ABNORMAL HIGH (ref 4.0–10.5)
WBC: 9.4 10*3/uL (ref 4.0–10.5)

## 2010-09-29 LAB — DIFFERENTIAL
Basophils Absolute: 0 10*3/uL (ref 0.0–0.1)
Basophils Absolute: 0 10*3/uL (ref 0.0–0.1)
Basophils Relative: 0 % (ref 0–1)
Basophils Relative: 0 % (ref 0–1)
Basophils Relative: 0 % (ref 0–1)
Eosinophils Absolute: 0 10*3/uL (ref 0.0–0.7)
Eosinophils Absolute: 0 10*3/uL (ref 0.0–0.7)
Eosinophils Absolute: 0.1 10*3/uL (ref 0.0–0.7)
Eosinophils Relative: 0 % (ref 0–5)
Eosinophils Relative: 0 % (ref 0–5)
Eosinophils Relative: 1 % (ref 0–5)
Lymphocytes Relative: 13 % (ref 12–46)
Lymphocytes Relative: 16 % (ref 12–46)
Lymphs Abs: 1.1 10*3/uL (ref 0.7–4.0)
Lymphs Abs: 1.4 10*3/uL (ref 0.7–4.0)
Lymphs Abs: 1.6 10*3/uL (ref 0.7–4.0)
Monocytes Absolute: 0.6 10*3/uL (ref 0.1–1.0)
Monocytes Absolute: 0.7 10*3/uL (ref 0.1–1.0)
Monocytes Relative: 6 % (ref 3–12)
Monocytes Relative: 6 % (ref 3–12)
Neutro Abs: 7.9 10*3/uL — ABNORMAL HIGH (ref 1.7–7.7)
Neutro Abs: 8.7 10*3/uL — ABNORMAL HIGH (ref 1.7–7.7)
Neutrophils Relative %: 78 % — ABNORMAL HIGH (ref 43–77)
Neutrophils Relative %: 80 % — ABNORMAL HIGH (ref 43–77)

## 2010-09-29 LAB — POCT I-STAT, CHEM 8
BUN: 10 mg/dL (ref 6–23)
Calcium, Ion: 1.09 mmol/L — ABNORMAL LOW (ref 1.12–1.32)
Chloride: 100 mEq/L (ref 96–112)
Creatinine, Ser: 0.6 mg/dL (ref 0.4–1.2)
Glucose, Bld: 137 mg/dL — ABNORMAL HIGH (ref 70–99)
HCT: 40 % (ref 36.0–46.0)
Hemoglobin: 13.6 g/dL (ref 12.0–15.0)
Potassium: 3.4 mEq/L — ABNORMAL LOW (ref 3.5–5.1)
Sodium: 135 mEq/L (ref 135–145)
TCO2: 27 mmol/L (ref 0–100)

## 2010-09-29 LAB — URINE MICROSCOPIC-ADD ON

## 2010-09-29 LAB — BASIC METABOLIC PANEL
BUN: 9 mg/dL (ref 6–23)
BUN: <1 mg/dL — ABNORMAL LOW (ref 6–23)
CO2: 28 mEq/L (ref 19–32)
CO2: 31 mEq/L (ref 19–32)
Chloride: 104 mEq/L (ref 96–112)
GFR calc non Af Amer: >60 mL/min (ref >60)
Glucose, Bld: 110 mg/dL — ABNORMAL HIGH (ref 70–99)
Glucose, Bld: 129 mg/dL — ABNORMAL HIGH (ref 70–99)
Potassium: 3.6 mEq/L (ref 3.5–5.1)
Potassium: 4 mEq/L (ref 3.5–5.1)
Sodium: 140 mEq/L (ref 135–145)

## 2010-09-29 LAB — HEMOGLOBIN AND HEMATOCRIT, BLOOD
Hemoglobin: 10 g/dL — ABNORMAL LOW (ref 12.0–15.0)
Hemoglobin: 10.4 g/dL — ABNORMAL LOW (ref 12.0–15.0)
Hemoglobin: 9.8 g/dL — ABNORMAL LOW (ref 12.0–15.0)

## 2010-09-29 LAB — CROSSMATCH

## 2010-09-29 LAB — URINALYSIS, ROUTINE W REFLEX MICROSCOPIC
Bilirubin Urine: NEGATIVE
Glucose, UA: NEGATIVE mg/dL
Hgb urine dipstick: NEGATIVE
Ketones, ur: 15 mg/dL — AB
Leukocytes, UA: NEGATIVE
Nitrite: NEGATIVE
Protein, ur: 30 mg/dL — AB
Specific Gravity, Urine: 1.018 (ref 1.005–1.030)
Urobilinogen, UA: 0.2 mg/dL (ref 0.0–1.0)
pH: 6 (ref 5.0–8.0)

## 2010-09-29 LAB — PROTIME-INR
INR: 0.97 (ref 0.00–1.49)
Prothrombin Time: 12.8 seconds (ref 11.6–15.2)

## 2010-09-29 LAB — LIPASE, BLOOD: Lipase: 26 U/L (ref 11–59)

## 2010-09-29 LAB — APTT: aPTT: 27 seconds (ref 24–37)

## 2010-11-23 NOTE — Assessment & Plan Note (Signed)
[facility]                            CARDIOLOGY OFFICE NOTE   NAME:[name]                       MRN:          [id number]  DATE:10/09/2007                            DOB:          Oct 25, 1934    [name] returns today for followup of the question of an intra-  aortic thrombus and also significant peripheral vascular disease.  Please see my note from September 27, 2007.   A pre-CT angiogram, creatinine was normal is 0.6.  CT angiogram showed  early aneurysmal dilatation of the infrarenal aorta measuring maximally  only 2.7 cm.  There was diffuse atherosclerotic calcification and a  mural plaque as well as tortuosity of the vessel.  There was no  intraluminal clot.  Her superior mesenteric artery probably had about a  50% narrowing. Her celiac artery, inferior mesenteric, and single renal  vessels were widely patent bilaterally.   In addition, her peripheral arterial Doppler showed ABIs of 1.0  bilaterally.   She is totally asymptomatic.  She has no complaints today.   Her medications are as previously listed.   PHYSICAL EXAMINATION:  VITAL SIGNS:  Her blood pressure is 174/88. Her  pulse is 72 and regular.  Weight is 123.  RESPIRATORY:  Exam is unchanged.  EXTREMITIES:  Her feet reveal good pulses and no edema.   I had a long talk with [name] today.  I have also called Dr. Herb Mcdonald with the results.  I would like to see her again in a year to re-  ultrasound her abdominal aorta and to follow this slight aneurysmal  dilatation.  In addition, she needs aggressive secondary prevention  which Dr. Collins Mcdonald is providing.  She probably will need an additional  medication added for her blood pressure.  With the systolic component  being so prominent, I would probably consider amlodipine up to 10 mg a  day.  I will leave this to Dr. Alda Mcdonald expertise.     Thomas C. Daleen Squibb, MD, [facility]  Electronically Signed    TCW/MedQ  DD: 10/09/2007   DT: 10/09/2007  Job #: [id number]   cc:   Tammy R. [name], M.D.

## 2010-11-23 NOTE — Assessment & Plan Note (Signed)
[facility]                            CARDIOLOGY OFFICE NOTE   NAME:TUCKERErminie, [name]                       MRN:          [id number]  DATE:09/27/2007                            DOB:          August 20, 1934    I was asked by Dr. Herb Grays to evaluate [name] with a question  of a thrombus in her distal abdominal aorta.   HISTORY OF PRESENT ILLNESS:  [name] is a delightful 76 year old  married white female who was recently at [facility] to have  mandibular defect from her history of osteomyelitis repaired with a bone  graft from her right hip or posterior iliac crest.   During her hospital stay, she had some dyspnea.  She underwent chest CT  which ruled out a pulmonary embolus.  She did have a subpleural nodule  in the right upper lobe, but of the main concern for [place] today is that  she had a large amount of mural thrombus noted in the distal abdominal  aorta.  There was question of an ulcerated plaque.  There is no mention  of abdominal aortic aneurysm.   She is totally asymptomatic from a cardiovascular standpoint.  She does  have significant dyspnea on exertion from smoking.  She notes no change  in her functional capacity however.  She says she had no abdominal pain,  no ripping or tearing pain in her back or down her pelvis.  She has had  no urinary urgency for pelvic pressure.  She also denies any symptoms of  distal emboli, such as cold toes, feet, or painful discolored areas on  her digits.  She has no claudication but I do not think she is really  that active.   PAST MEDICAL HISTORY:  She has had hypertension for the last 7 or 8  months.  This being addressed by Dr. Collins Scotland, and her blood pressure is  actually under pretty good control today.  She smoked until September  2007.  She does not use any alcohol.  She does have 4 to 5 caffeinated  beverages a day.  She has hyperlipidemia which is also being treated by  Dr. Collins Scotland.   PREVIOUS SURGERY:  Mandibular atrophy with diseased bone removed in July  2008.  Oral reconstruction with a bone graft most recently August 28, 2007.  Tonsillectomy and appendectomy as a child.   CURRENT MEDS:  1. Metoprolol tartrate 25 mg 1/2 tablet twice a day.  2. Pravastatin 40 mg daily.  3. Aspirin 81 mg a day.  4. Atrovent.  5. Albuterol.  6. Centrum Silver.  7. Caltrate 600.  8. Vitamin D3.  9. Vitamin C.   FAMILY HISTORY:  Really not pertinent.  She did have her brother died at  47 of brain aneurysm, just for no noteworthiness.   SOCIAL HISTORY:  Retired.  She retired in 1999.  She is married and has  4 grown children.  Her husband comes with her today.   REVIEW OF SYSTEMS:  Other than HPI, negative times all systems reviewed.  She does give a history  of her chronic emphysema as well.   EXAM:  Her blood pressure is 181/93 today, rechecked it was 160 in both  arms.  Her pulse 66 and regular.  She is 5 feet 1 inches, weighs 124  pounds.  HEENT:  Normocephalic, atraumatic.  PERRL.  Extraocular is intact.  Sclerae are clear.  Facial symmetry is not symmetrical with a healing  incision of her lower chin.  Neck is supple.  Carotids are equal bilateral bruits, no JVD.  Thyroid  is not enlarged.  Trachea is midline.  Lungs were clear except for decreased breath sounds throughout.  There  is no mid-thoracic bruit.  HEART:  Reveals a nondisplaced PMI.  Normal S1-S2.  ABDOMEN:  Soft, good bowel sounds.  There was no midline tenderness or  pulsatile mass.  There is no bruit.  There is no flank bruit, no obvious  organomegaly.  EXTREMITIES:  Reveal her femoral pulses be 2+/4+ bilateral symmetrical  without bruits.  Distal pulses were 2+ or 4+ both dorsalis pedis an 1+  posterior tibial.  There is no sign of distal embolic phenomenon.  There  is no sign of DVT.  There is no significant muscle atrophy.  Musculoskeletal shows chronic arthritic changes.  NEURO:  Exam was grossly  intact.   Outside laboratory data was reviewed and revealed a normal EKG on three  different occasions, last being August 30, 2007.  We repeated one  today which is basically identical with signs of early repolarization  otherwise negative.  She does have a most likely rightward axis.  Her  laboratory data from Dr. Alda Berthold office has been reviewed.  Pertinent  findings include an LDL of 162, total cholesterol 251, HDL of 59.  She  has a creatinine of 0.45, normal coags, hemoglobin the most recent 11.1.   I had a long talk with [name] and her husband today.  I have  explained to them what this abdominal aortic thrombus could be and its  clinical indications.  She is totally asymptomatic and I suspect this  has been there for quite some time.  In fact, she is remote smoker has  other risk factors certainly gave her predisposition to this.   The big question is does she have an accompanying aneurysm or  dissection, which probably is chronic if it is there.  We also need to  determine if it extends down into her iliac system.  Though she has good  pulses, we also need to make sure that she has not occluded any distal  branches in her lower extremities.   PLAN:  1. CT of her abdomen and pelvis with contrast.  2. Peripheral arterial Dopplers.  3. Good secondary prevention including blood pressure control and      aggressive cholesterol management with an LDL goal was low as      possible and continued aspirin.   I will give Dr. Collins Scotland a call once I have these results and I sit down  with the patient and explain the findings.  Hopefully this will be  something that will only require watchful waiting.  I have spent a lot  time talking her about the symptoms of a dissecting aortic aneurysm,  distal emboli, et Karie Soda.  All questions were answered.     Thomas C. Daleen Squibb, MD, [facility]  Electronically Signed    TCW/MedQ  DD: 09/27/2007  DT: 09/27/2007  Job #: [id number]   cc:   Tammy R.  Collins Scotland, M.D.

## 2010-11-23 NOTE — Assessment & Plan Note (Signed)
[facility]                            CARDIOLOGY OFFICE NOTE   NAME:[name], [name]                       MRN:          [id number]  DATE:10/09/2007                            DOB:          02/08/1935    ADDENDUM:  Please send a copy of that note that I just dictated to Herb Grays at the [facility] in [place], [place].     Thomas C. Daleen Squibb, MD, [facility]  Electronically Signed    TCW/MedQ  DD: 10/09/2007  DT: 10/09/2007  Job #: [id number]

## 2010-11-26 NOTE — Op Note (Signed)
[name], [name]                ACCOUNT NO.:  [id number]   MEDICAL RECORD NO.:  [id number]          PATIENT TYPE:  AMB   LOCATION:  [facility]                         FACILITY:  [facility]   PHYSICIAN:  Dennison Nancy. Kimbrough, M.D.DATE OF BIRTH:  1934-12-20   DATE OF PROCEDURE:  02/23/2005  DATE OF DISCHARGE:                                 OPERATIVE REPORT   PREOPERATIVE DIAGNOSIS:  Hematuria, recurrent urinary tract infections in a  smoker with inflammatory bladder lesions on cystoscopy.   POSTOPERATIVE DIAGNOSIS:  Hematuria, recurrent urinary tract infections in a  smoker with inflammatory bladder lesions on cystoscopy.   OPERATION:  Cystoscopy and bladder biopsy, fulguration x4.   ANESTHESIA:  General.   SURGEON:  Courtney Paris, M.D.   BRIEF HISTORY:  This 76 year old smoker was referred by [facility] for evaluation of hematuria. There was total gross with some  dysuria in August x2. She is a pack a day smoker and has been smoking for 49  years. Her upper tracts are normal by CT scan. She enters for biopsies of  some mildly inflammatory lesions seen on the office cystoscopy that could be  CIS.   DESCRIPTION OF PROCEDURE:  The patient was placed on the operating table in  dorsal lithotomy position and after satisfactory induction of general  anesthesia was prepped and draped with Betadine in the usual sterile  fashion. The 22 panendoscope was inserted and the bladder carefully  inspected. The bladder was free of any mucosal lesions except it was mildly  trabeculated and there were some inflammatory lesions. These were each  biopsied separately and then fulgurated with the Bugbee electrode, one at  the right posterior base, one at the anterior wall, one at the left  posterior base, one at the left lateral wall. These were sent as separate  specimens. The base was fulgurated and the bleeding control was good. The  scope was removed. The B&O suppository  inserted. The patient was taken to  the recovery room in good condition. She will be later discharged as an  outpatient if she is able to void and will be called regarding the biopsy  report when available.      Courtney Paris, M.D.  Electronically Signed     HMK/MEDQ  D:  02/23/2005  T:  02/23/2005  Job:  [id number]

## 2011-02-14 DIAGNOSIS — I6529 Occlusion and stenosis of unspecified carotid artery: Secondary | ICD-10-CM | POA: Insufficient documentation

## 2011-03-01 ENCOUNTER — Encounter: Payer: Self-pay | Admitting: Cardiology

## 2011-03-02 ENCOUNTER — Encounter: Payer: Self-pay | Admitting: Cardiology

## 2011-03-03 ENCOUNTER — Ambulatory Visit (INDEPENDENT_AMBULATORY_CARE_PROVIDER_SITE_OTHER): Payer: Self-pay | Admitting: Cardiology

## 2011-03-03 ENCOUNTER — Encounter: Payer: Self-pay | Admitting: Cardiology

## 2011-03-03 VITALS — BP 145/79 | HR 64 | Ht 61.0 in | Wt 129.1 lb

## 2011-03-03 DIAGNOSIS — I779 Disorder of arteries and arterioles, unspecified: Secondary | ICD-10-CM

## 2011-03-03 NOTE — Patient Instructions (Addendum)
Your physician recommends that you schedule a follow-up appointment in:  2 years with Dr. Daleen Squibb  Please see handout given about TIAs ( Transischemic attacks)  Please see handout about unstable angina

## 2011-03-03 NOTE — Assessment & Plan Note (Signed)
Clinically, she is asymptomatic. I carefully reviewed with her the symptoms of TIAs or stroke. She is on secondary preventative therapy including not smoking, statin, aspirin, and blood pressure control. We also talked about how to react and respond  to a TIA. Since she does not have obstructive disease, will repeat her carotid Dopplers in 2 years. I've advised her to have this done by an accredited lab.

## 2011-03-03 NOTE — Progress Notes (Signed)
HPI [name] returns today for followup of her history of peripheral vascular disease and now with mild carotid disease diagnosed by a life screening survey. The report says mild carotid artery disease bilaterally but does not give any percent stenosis. The report also says she does not have an abdominal aortic aneurysm. Her ABIs were mildly reduced at 0.9 bilaterally.  She's not having any symptoms of TIAs or mini strokes. She denies any angina or ischemic symptoms were carefully reviewed. She also denies any claudication. Her toes are purplish particularly when dependent but she was a heavy smoker in the past. She quit in 2007.  She is currently on aspirin, statin therapy, and antihypertensive therapy.  Her EKG is normal. Past Medical History  Diagnosis Date  . PVD (peripheral vascular disease)   . Actinomycosis, cervicofacial   . Emphysema   . COPD (chronic obstructive pulmonary disease)   . Candidiasis, vagina   . HTN (hypertension)     Past Surgical History  Procedure Date  . Appendectomy   . Tonsillectomy     Family History  Problem Relation Age of Onset  . Coronary artery disease      family hx of 1st. degree relative <60  . Coronary artery disease      family hx of female 1st. degree relative <50  . Emphysema Father   . Emphysema      3 sisters. they were smokers  . Stomach cancer Sister     History   Social History  . Marital Status: Married    Spouse Name: N/A    Number of Children: N/A  . Years of Education: N/A   Occupational History  . retired    Social History Main Topics  . Smoking status: Former Smoker    Types: Cigarettes    Quit date: 07/11/2005  . Smokeless tobacco: Not on file   Comment: smoked for 48 years 1 ppd  . Alcohol Use: No  . Drug Use: No  . Sexually Active: Not on file   Other Topics Concern  . Not on file   Social History Narrative  . No narrative on file    Allergies  Allergen Reactions  . Penicillins   . Pneumovax  (Pneumococcal Polysaccharides)     Current Outpatient Prescriptions  Medication Sig Dispense Refill  . Ascorbic Acid (VITAMIN C PO) Take by mouth daily.        Marland Kitchen aspirin 81 MG tablet Take 81 mg by mouth daily.        Marland Kitchen atorvastatin (LIPITOR) 10 MG tablet Take 10 mg by mouth daily.        . budesonide-formoterol (SYMBICORT) 160-4.5 MCG/ACT inhaler Inhale 2 puffs into the lungs 2 (two) times daily.        . calcium-vitamin D (OSCAL WITH D) 500-200 MG-UNIT per tablet Take 1 tablet by mouth 2 (two) times daily.        . Coenzyme Q10 (COQ-10 PO) Take by mouth daily.        . metoprolol tartrate (LOPRESSOR) 25 MG tablet Take 1/2 tablet twice a day       . Multiple Vitamin (MULTIVITAMIN) tablet Take 1 tablet by mouth daily.        . Multiple Vitamins-Minerals (B COMPLEX PLUS VITAMIN C PO) Take 1 tablet by mouth daily.        Marland Kitchen oxybutynin (DITROPAN XL) 15 MG 24 hr tablet Take 15 mg by mouth as needed.        . tiotropium (  SPIRIVA) 18 MCG inhalation capsule Place 18 mcg into inhaler and inhale daily.        Marland Kitchen VITAMIN E PO Take by mouth daily.          ROS Negative other than HPI.   PE General Appearance: well developed, well nourished in no acute distress HEENT: symmetrical face, PERRLA, good dentition  Neck: no JVD, thyromegaly, or adenopathy, trachea midline Chest: symmetric without deformity Cardiac: PMI non-displaced, RRR, normal S1, S2, no gallop or murmur Lung: clear to ausculation and percussion Vascular: all pulses full without bruits, specifically no carotid bruits  Abdominal: nondistended, nontender, good bowel sounds, no HSM, no bruits Extremities: no cyanosis, clubbing or edema, no sign of DVT, no varicosities, delayed capillary refill of all her toes but palpable dorsalis pedis and posterior tibial pulses bilaterally.  Skin: normal color, no rashes Neuro: alert and oriented x 3, non-focal Pysch: normal affect Filed Vitals:   03/03/11 1336  BP: 145/79  Pulse: 64  Height: 5'  1" (1.549 m)  Weight: 129 lb 1.9 oz (58.568 kg)    EKG  Labs and Studies Reviewed.   Lab Results  Component Value Date   WBC 7.5 06/15/2010   HGB 13.6 06/15/2010   HCT 39.9 06/15/2010   MCV 95.7 06/15/2010   PLT 390.0 06/15/2010      Chemistry      Component Value Date/Time   NA 139 06/15/2010 1621   K 4.7 06/15/2010 1621   CL 99 06/15/2010 1621   CO2 31 06/15/2010 1621   BUN 8 06/15/2010 1621   CREATININE 0.6 06/15/2010 1621      Component Value Date/Time   CALCIUM 9.7 06/15/2010 1621   ALKPHOS 43 10/16/2009 2023   AST 22 10/16/2009 2023   ALT 21 10/16/2009 2023   BILITOT 0.6 10/16/2009 2023       Lab Results  Component Value Date   CHOL 213 12/16/2008   Lab Results  Component Value Date   HDL 62 12/16/2008   Lab Results  Component Value Date   LDLCALC 116 12/16/2008   No results found for this basename: TRIG   No results found for this basename: CHOLHDL   No results found for this basename: HGBA1C   Lab Results  Component Value Date   ALT 21 10/16/2009   AST 22 10/16/2009   ALKPHOS 43 10/16/2009   BILITOT 0.6 10/16/2009   Lab Results  Component Value Date   TSH 1.99 06/15/2010

## 2011-07-06 ENCOUNTER — Encounter (HOSPITAL_COMMUNITY): Payer: Self-pay | Admitting: Emergency Medicine

## 2011-07-06 ENCOUNTER — Emergency Department (HOSPITAL_COMMUNITY)
Admission: EM | Admit: 2011-07-06 | Discharge: 2011-07-06 | Disposition: A | Discharge status: Home / Self Care | Payer: Medicare Other | Source: Home / Self Care

## 2011-07-06 DIAGNOSIS — J441 Chronic obstructive pulmonary disease with (acute) exacerbation: Secondary | ICD-10-CM

## 2011-07-06 DIAGNOSIS — J069 Acute upper respiratory infection, unspecified: Secondary | ICD-10-CM

## 2011-07-06 MED ORDER — PREDNISONE 10 MG PO TABS: ORAL_TABLET | ORAL | Status: DC

## 2011-07-06 MED ORDER — PREDNISONE 20 MG PO TABS
ORAL_TABLET | ORAL | Status: AC
  Filled 2011-07-06: qty 3

## 2011-07-06 MED ORDER — IPRATROPIUM BROMIDE 0.02 % IN SOLN: 500.0000 ug | Freq: Four times a day (QID) | RESPIRATORY_TRACT | Status: DC

## 2011-07-06 MED ORDER — ALBUTEROL SULFATE (2.5 MG/3ML) 0.083% IN NEBU: 2.5000 mg | INHALATION_SOLUTION | Freq: Four times a day (QID) | RESPIRATORY_TRACT | Status: DC | PRN

## 2011-07-06 MED ORDER — ALBUTEROL SULFATE (5 MG/ML) 0.5% IN NEBU
INHALATION_SOLUTION | RESPIRATORY_TRACT | Status: AC
  Filled 2011-07-06: qty 1

## 2011-07-06 MED ORDER — ALBUTEROL SULFATE (2.5 MG/3ML) 0.083% IN NEBU: 2.5000 mg | INHALATION_SOLUTION | RESPIRATORY_TRACT | Status: DC | PRN

## 2011-07-06 MED ORDER — PREDNISONE 20 MG PO TABS
ORAL_TABLET | ORAL | Status: AC
  Filled 2011-07-06: qty 1

## 2011-07-06 MED ORDER — PREDNISONE 20 MG PO TABS
60.0000 mg | ORAL_TABLET | Freq: Once | ORAL | Status: AC
  Administered 2011-07-06: 60 mg via ORAL

## 2011-07-06 MED ORDER — IPRATROPIUM BROMIDE 0.02 % IN SOLN
0.5000 mg | Freq: Once | RESPIRATORY_TRACT | Status: AC
  Administered 2011-07-06: 0.5 mg via RESPIRATORY_TRACT

## 2011-07-06 MED ORDER — ALBUTEROL SULFATE (5 MG/ML) 0.5% IN NEBU
5.0000 mg | INHALATION_SOLUTION | Freq: Once | RESPIRATORY_TRACT | Status: AC
  Administered 2011-07-06: 5 mg via RESPIRATORY_TRACT

## 2011-07-06 NOTE — ED Notes (Signed)
eval of this patient : speaking in complete sentences, cough for several days, and her MD office is closed

## 2011-07-06 NOTE — ED Notes (Signed)
Pt here with constant moist productive cough clear thick phlegm that started on Sunday but has progressed to head congestion.h/a.pt has hx copd and not on home o2.reports of sob on exertion.denies fever,n,v,d.sats 100r/a and temp 99.3

## 2011-07-06 NOTE — ED Provider Notes (Signed)
History     CSN: [id number]  Arrival date & time 07/06/11  1137   None     Chief Complaint  Patient presents with  . Cough  . URI    (Consider location/radiation/quality/duration/timing/severity/associated sxs/prior treatment) HPI Comments: Pt presents with c/o dyspnea and cough x 3 days - worsening. Dyspnea worsens with exertion. Cough is sometimes productive with clear phlegm. She also has nasal congestion. No fever or chills. She has a hx of COPD and is using her Symbicort and Spiriva inhalers daily as prescribed. She has been using albuterol mdi with only minimal symptom improvement. Pt states she was unable to get an appt with her PCP office today.    Past Medical History  Diagnosis Date  . PVD (peripheral vascular disease)   . Actinomycosis, cervicofacial   . Emphysema   . COPD (chronic obstructive pulmonary disease)   . Candidiasis, vagina   . HTN (hypertension)     Past Surgical History  Procedure Date  . Appendectomy   . Tonsillectomy     Family History  Problem Relation Age of Onset  . Coronary artery disease      family hx of 1st. degree relative <60  . Coronary artery disease      family hx of female 1st. degree relative <50  . Emphysema Father   . Emphysema      3 sisters. they were smokers  . Stomach cancer Sister     History  Substance Use Topics  . Smoking status: Former Smoker    Types: Cigarettes    Quit date: 07/11/2005  . Smokeless tobacco: Not on file   Comment: smoked for 48 years 1 ppd  . Alcohol Use: No    OB History    Grav Para Term Preterm Abortions TAB SAB Ect Mult Living                  Review of Systems  Constitutional: Negative for fever and chills.  HENT: Positive for rhinorrhea. Negative for ear pain, sore throat, postnasal drip and sinus pressure.   Respiratory: Positive for cough, shortness of breath and wheezing.   Cardiovascular: Negative for chest pain and palpitations.  Neurological: Negative for dizziness and  headaches.    Allergies  Penicillins and Pneumovax  Home Medications   Current Outpatient Rx  Name Route Sig Dispense Refill  . ALBUTEROL SULFATE (2.5 MG/3ML) 0.083% IN NEBU Nebulization Take 2.5 mg by nebulization every 6 (six) hours as needed. Last taken 06/06/11     . ALBUTEROL SULFATE (2.5 MG/3ML) 0.083% IN NEBU Nebulization Take 3 mLs (2.5 mg total) by nebulization every 6 (six) hours as needed for wheezing. 75 mL 0  . ALBUTEROL SULFATE (2.5 MG/3ML) 0.083% IN NEBU Nebulization Take 3 mLs (2.5 mg total) by nebulization every 4 (four) hours as needed for wheezing. 75 mL 0  . VITAMIN C PO Oral Take by mouth daily.      . ASPIRIN 81 MG PO TABS Oral Take 81 mg by mouth daily.      . ATORVASTATIN CALCIUM 10 MG PO TABS Oral Take 10 mg by mouth daily.      . BUDESONIDE-FORMOTEROL FUMARATE 160-4.5 MCG/ACT IN AERO Inhalation Inhale 2 puffs into the lungs 2 (two) times daily.      Marland Kitchen CALCIUM CARBONATE-VITAMIN D 500-200 MG-UNIT PO TABS Oral Take 1 tablet by mouth 2 (two) times daily.      . COQ-10 PO Oral Take by mouth daily.      Marland Kitchen  IPRATROPIUM BROMIDE 0.02 % IN SOLN Nebulization Take 2.5 mLs (500 mcg total) by nebulization 4 (four) times daily. 75 mL 0  . IPRATROPIUM BROMIDE 0.02 % IN SOLN Nebulization Take 2.5 mLs (500 mcg total) by nebulization 4 (four) times daily. 75 mL 0  . METOPROLOL TARTRATE 25 MG PO TABS  Take 1/2 tablet twice a day     . ONE-DAILY MULTI VITAMINS PO TABS Oral Take 1 tablet by mouth daily.      . B COMPLEX PLUS VITAMIN C PO Oral Take 1 tablet by mouth daily.      . OXYBUTYNIN CHLORIDE ER 15 MG PO TB24 Oral Take 15 mg by mouth as needed.      Marland Kitchen PREDNISONE 10 MG PO TABS  Take 5 tabs today, then 4 tabs day 2, then 3 tabs day 3, then 2 tabs day 4, then 1 tab day 5 15 tablet 0  . PREDNISONE 10 MG PO TABS  Take 5 tabs today, then 4 tabs day 2, then 3 tabs day 3, then 2 tabs day 4, then 1 tab day 5 21 tablet 0  . TIOTROPIUM BROMIDE MONOHYDRATE 18 MCG IN CAPS Inhalation Place 18  mcg into inhaler and inhale daily.      Marland Kitchen VITAMIN E PO Oral Take by mouth daily.        BP 170/92  Pulse 108  Temp(Src) 99.3 F (37.4 C) (Oral)  Resp 19  SpO2 100%  Physical Exam  Nursing note and vitals reviewed. Constitutional: She appears well-developed and well-nourished. No distress.  HENT:  Head: Normocephalic and atraumatic.  Right Ear: Tympanic membrane, external ear and ear canal normal.  Left Ear: Tympanic membrane, external ear and ear canal normal.  Nose: Nose normal.  Mouth/Throat: Uvula is midline, oropharynx is clear and moist and mucous membranes are normal. No oropharyngeal exudate, posterior oropharyngeal edema or posterior oropharyngeal erythema.  Neck: Neck supple.  Cardiovascular: Normal rate, regular rhythm and normal heart sounds.   Pulmonary/Chest: She is in respiratory distress. She has decreased breath sounds. She has wheezes. She has no rhonchi. She has no rales.       Pursed lips.  Lymphadenopathy:    She has no cervical adenopathy.  Neurological: She is alert.  Skin: Skin is warm and dry.  Psychiatric: She has a normal mood and affect.    ED Course  Procedures (including critical care time)  Labs Reviewed - No data to display No results found.   1. Acute exacerbation of chronic obstructive pulmonary disease (COPD)   2. Acute URI       MDM  After NMT pt reports symptomatic improvement. Lungs - improved air movement and no wheeze. Pulse ox 98-100 %. Pt was walked, and maintained O2 sats and did not have exertional dyspnea.         Melody Comas, [place] 07/06/11 [phone]

## 2011-07-06 NOTE — ED Provider Notes (Signed)
Medical screening examination/treatment/procedure(s) were performed by non-physician practitioner and as supervising physician I was immediately available for consultation/collaboration.  LANEY,RONNIE   Ronnie Laney, MD 07/06/11 1939 

## 2011-07-08 ENCOUNTER — Encounter (HOSPITAL_COMMUNITY): Payer: Self-pay

## 2011-07-08 ENCOUNTER — Inpatient Hospital Stay (HOSPITAL_COMMUNITY)
Admission: EM | Admit: 2011-07-08 | Discharge: 2011-07-15 | DRG: 189 | Disposition: A | Discharge status: Home / Self Care | Payer: Medicare Other | Attending: Internal Medicine | Admitting: Internal Medicine

## 2011-07-08 ENCOUNTER — Emergency Department (HOSPITAL_COMMUNITY): Payer: Medicare Other

## 2011-07-08 ENCOUNTER — Other Ambulatory Visit: Payer: Self-pay

## 2011-07-08 DIAGNOSIS — I10 Essential (primary) hypertension: Secondary | ICD-10-CM | POA: Diagnosis present

## 2011-07-08 DIAGNOSIS — I739 Peripheral vascular disease, unspecified: Secondary | ICD-10-CM | POA: Diagnosis present

## 2011-07-08 DIAGNOSIS — R000 Tachycardia, unspecified: Secondary | ICD-10-CM | POA: Diagnosis present

## 2011-07-08 DIAGNOSIS — J9600 Acute respiratory failure, unspecified whether with hypoxia or hypercapnia: Principal | ICD-10-CM | POA: Diagnosis present

## 2011-07-08 DIAGNOSIS — J441 Chronic obstructive pulmonary disease with (acute) exacerbation: Secondary | ICD-10-CM | POA: Diagnosis present

## 2011-07-08 DIAGNOSIS — J438 Other emphysema: Secondary | ICD-10-CM | POA: Diagnosis present

## 2011-07-08 DIAGNOSIS — I2510 Atherosclerotic heart disease of native coronary artery without angina pectoris: Secondary | ICD-10-CM | POA: Diagnosis present

## 2011-07-08 LAB — BASIC METABOLIC PANEL
BUN: 13 mg/dL (ref 6–23)
CO2: 28 mEq/L (ref 19–32)
Calcium: 9 mg/dL (ref 8.4–10.5)
Chloride: 97 mEq/L (ref 96–112)
Creatinine, Ser: 0.64 mg/dL (ref 0.50–1.10)
GFR calc Af Amer: >90 mL/min (ref >90)
GFR calc non Af Amer: 86 mL/min — ABNORMAL LOW (ref >90)
Glucose, Bld: 188 mg/dL — ABNORMAL HIGH (ref 70–99)
Potassium: 4 mEq/L (ref 3.5–5.1)
Sodium: 135 mEq/L (ref 135–145)

## 2011-07-08 LAB — CBC
HCT: 43.2 % (ref 36.0–46.0)
HCT: 40.5 % (ref 36.0–46.0)
Hemoglobin: 14.4 g/dL (ref 12.0–15.0)
Hemoglobin: 13.8 g/dL (ref 12.0–15.0)
MCH: 31.5 pg (ref 26.0–34.0)
MCHC: 33.3 g/dL (ref 30.0–36.0)
MCHC: 34.1 g/dL (ref 30.0–36.0)
MCV: 94.5 fL (ref 78.0–100.0)
MCV: 93.8 fL (ref 78.0–100.0)
Platelets: 309 10*3/uL (ref 150–400)
RBC: 4.57 MIL/uL (ref 3.87–5.11)
RDW: 12.8 % (ref 11.5–15.5)
RDW: 12.9 % (ref 11.5–15.5)
WBC: 9.5 10*3/uL (ref 4.0–10.5)

## 2011-07-08 LAB — POCT I-STAT TROPONIN I: Troponin i, poc: 0.03 ng/mL (ref 0.00–0.08)

## 2011-07-08 LAB — POCT I-STAT 3, ART BLOOD GAS (G3+)
Acid-Base Excess: 1 mmol/L (ref 0.0–2.0)
O2 Saturation: 100 %
TCO2: 27 mmol/L (ref 0–100)
pCO2 arterial: 40.5 mmHg (ref 35.0–45.0)
pO2, Arterial: 181 mmHg — ABNORMAL HIGH (ref 80.0–100.0)

## 2011-07-08 LAB — CREATININE, SERUM: GFR calc Af Amer: >90 mL/min (ref >90)

## 2011-07-08 MED ORDER — ONDANSETRON HCL 4 MG PO TABS: 4.0000 mg | ORAL_TABLET | Freq: Four times a day (QID) | ORAL | Status: DC | PRN

## 2011-07-08 MED ORDER — METHYLPREDNISOLONE SODIUM SUCC 125 MG IJ SOLR
60.0000 mg | Freq: Two times a day (BID) | INTRAMUSCULAR | Status: DC
  Administered 2011-07-08: 60 mg via INTRAVENOUS
  Filled 2011-07-08 (×2): qty 0.96
  Filled 2011-07-08: qty 2

## 2011-07-08 MED ORDER — MOXIFLOXACIN HCL IN NACL 400 MG/250ML IV SOLN
400.0000 mg | INTRAVENOUS | Status: DC
  Filled 2011-07-08: qty 250

## 2011-07-08 MED ORDER — ASPIRIN 81 MG PO TABS: 81.0000 mg | ORAL_TABLET | Freq: Every day | ORAL | Status: DC

## 2011-07-08 MED ORDER — METOPROLOL TARTRATE 12.5 MG HALF TABLET
12.5000 mg | ORAL_TABLET | Freq: Two times a day (BID) | ORAL | Status: DC
  Administered 2011-07-08 – 2011-07-11 (×6): 12.5 mg via ORAL
  Filled 2011-07-08 (×7): qty 1

## 2011-07-08 MED ORDER — METHYLPREDNISOLONE SODIUM SUCC 125 MG IJ SOLR
125.0000 mg | Freq: Once | INTRAMUSCULAR | Status: AC
  Administered 2011-07-08: 125 mg via INTRAVENOUS

## 2011-07-08 MED ORDER — MORPHINE SULFATE 2 MG/ML IJ SOLN: 2.0000 mg | INTRAMUSCULAR | Status: DC | PRN

## 2011-07-08 MED ORDER — MAGNESIUM SULFATE 50 % IJ SOLN: 1.0000 g | Freq: Once | INTRAMUSCULAR | Status: DC

## 2011-07-08 MED ORDER — ALBUTEROL SULFATE (5 MG/ML) 0.5% IN NEBU
5.0000 mg | INHALATION_SOLUTION | Freq: Once | RESPIRATORY_TRACT | Status: AC
  Administered 2011-07-08: 5 mg via RESPIRATORY_TRACT
  Filled 2011-07-08: qty 1

## 2011-07-08 MED ORDER — ALBUTEROL SULFATE (5 MG/ML) 0.5% IN NEBU
5.0000 mg | INHALATION_SOLUTION | RESPIRATORY_TRACT | Status: DC
  Administered 2011-07-08: 5 mg via RESPIRATORY_TRACT
  Filled 2011-07-08: qty 1

## 2011-07-08 MED ORDER — OXYBUTYNIN CHLORIDE ER 15 MG PO TB24
15.0000 mg | ORAL_TABLET | ORAL | Status: DC | PRN
  Filled 2011-07-08: qty 1

## 2011-07-08 MED ORDER — ACETAMINOPHEN 650 MG RE SUPP: 650.0000 mg | Freq: Four times a day (QID) | RECTAL | Status: DC | PRN

## 2011-07-08 MED ORDER — METHYLPREDNISOLONE SODIUM SUCC 125 MG IJ SOLR
INTRAMUSCULAR | Status: AC
  Filled 2011-07-08: qty 2

## 2011-07-08 MED ORDER — IPRATROPIUM BROMIDE 0.02 % IN SOLN
0.5000 mg | Freq: Once | RESPIRATORY_TRACT | Status: AC
  Administered 2011-07-08: 0.5 mg via RESPIRATORY_TRACT
  Filled 2011-07-08: qty 2.5

## 2011-07-08 MED ORDER — ENOXAPARIN SODIUM 30 MG/0.3ML ~~LOC~~ SOLN: 30.0000 mg | SUBCUTANEOUS | Status: DC

## 2011-07-08 MED ORDER — ASPIRIN EC 81 MG PO TBEC
81.0000 mg | DELAYED_RELEASE_TABLET | Freq: Every day | ORAL | Status: DC
  Administered 2011-07-08 – 2011-07-15 (×8): 81 mg via ORAL
  Filled 2011-07-08 (×8): qty 1

## 2011-07-08 MED ORDER — ACETAMINOPHEN 325 MG PO TABS: 650.0000 mg | ORAL_TABLET | Freq: Four times a day (QID) | ORAL | Status: DC | PRN

## 2011-07-08 MED ORDER — MAGNESIUM SULFATE 40 MG/ML IJ SOLN
2.0000 g | Freq: Once | INTRAMUSCULAR | Status: AC
  Administered 2011-07-08: 2 g via INTRAVENOUS
  Filled 2011-07-08: qty 50

## 2011-07-08 MED ORDER — ONDANSETRON HCL 4 MG/2ML IJ SOLN: 4.0000 mg | Freq: Four times a day (QID) | INTRAMUSCULAR | Status: DC | PRN

## 2011-07-08 MED ORDER — LEVALBUTEROL HCL 0.63 MG/3ML IN NEBU
0.6300 mg | INHALATION_SOLUTION | Freq: Four times a day (QID) | RESPIRATORY_TRACT | Status: DC
  Administered 2011-07-08 – 2011-07-11 (×9): 0.63 mg via RESPIRATORY_TRACT
  Filled 2011-07-08 (×15): qty 3

## 2011-07-08 MED ORDER — IPRATROPIUM BROMIDE 0.02 % IN SOLN
0.5000 mg | Freq: Four times a day (QID) | RESPIRATORY_TRACT | Status: DC
  Administered 2011-07-08 – 2011-07-11 (×10): 0.5 mg via RESPIRATORY_TRACT
  Filled 2011-07-08 (×12): qty 2.5

## 2011-07-08 MED ORDER — MOXIFLOXACIN HCL IN NACL 400 MG/250ML IV SOLN
400.0000 mg | INTRAVENOUS | Status: DC
  Administered 2011-07-09 – 2011-07-11 (×3): 400 mg via INTRAVENOUS
  Filled 2011-07-08 (×4): qty 250

## 2011-07-08 MED ORDER — ENOXAPARIN SODIUM 40 MG/0.4ML ~~LOC~~ SOLN
40.0000 mg | SUBCUTANEOUS | Status: DC
  Administered 2011-07-08 – 2011-07-14 (×7): 40 mg via SUBCUTANEOUS
  Filled 2011-07-08 (×8): qty 0.4

## 2011-07-08 MED ORDER — LEVALBUTEROL HCL 0.63 MG/3ML IN NEBU
0.6300 mg | INHALATION_SOLUTION | RESPIRATORY_TRACT | Status: DC | PRN
  Administered 2011-07-08 – 2011-07-13 (×4): 0.63 mg via RESPIRATORY_TRACT
  Filled 2011-07-08 (×2): qty 3

## 2011-07-08 MED ORDER — ONE-DAILY MULTI VITAMINS PO TABS: 1.0000 | ORAL_TABLET | Freq: Every day | ORAL | Status: DC

## 2011-07-08 MED ORDER — MOXIFLOXACIN HCL IN NACL 400 MG/250ML IV SOLN
400.0000 mg | INTRAVENOUS | Status: AC
  Administered 2011-07-08: 400 mg via INTRAVENOUS
  Filled 2011-07-08: qty 250

## 2011-07-08 MED ORDER — ALBUTEROL SULFATE (5 MG/ML) 0.5% IN NEBU
INHALATION_SOLUTION | RESPIRATORY_TRACT | Status: AC
  Filled 2011-07-08: qty 2

## 2011-07-08 MED ORDER — ADULT MULTIVITAMIN W/MINERALS CH
1.0000 | ORAL_TABLET | Freq: Every day | ORAL | Status: DC
  Administered 2011-07-09 – 2011-07-15 (×7): 1 via ORAL
  Filled 2011-07-08 (×7): qty 1

## 2011-07-08 MED ORDER — SIMVASTATIN 20 MG PO TABS
20.0000 mg | ORAL_TABLET | Freq: Every day | ORAL | Status: DC
  Administered 2011-07-08 – 2011-07-15 (×8): 20 mg via ORAL
  Filled 2011-07-08 (×8): qty 1

## 2011-07-08 MED ORDER — ALBUTEROL SULFATE (5 MG/ML) 0.5% IN NEBU
10.0000 mg | INHALATION_SOLUTION | Freq: Once | RESPIRATORY_TRACT | Status: AC
  Administered 2011-07-08: 10 mg via RESPIRATORY_TRACT
  Filled 2011-07-08: qty 2

## 2011-07-08 MED ORDER — OXYCODONE HCL 5 MG PO TABS: 5.0000 mg | ORAL_TABLET | ORAL | Status: DC | PRN

## 2011-07-08 NOTE — ED Notes (Signed)
Pt's family arrived and were informed of pt's condition.  Husband was able to give staff more information of medical conditions and medications.

## 2011-07-08 NOTE — ED Notes (Signed)
Regular Diet Tray ordered  

## 2011-07-08 NOTE — ED Notes (Signed)
Pt presents from home with recently diagnosis of bronchitis. She had gotten a prescription of prednisone but had not gotten it filled yet. Upon ems arrival she had already taken albuterol/atrovent neb with no relief. Upon ems arrival they gave her 2 sl nitro's and a total of 10mg  albuterol neb with no relief. Pt is alert and oriented. She is unable to speak or recline. Pt has pursed lips. Respiratory therapy notified and second breathing treatment started per md order. Iv started pta by ems. Radiology at the bedside to complete chest x-ray and lab to collect blood. Family updated about plan of care.

## 2011-07-08 NOTE — ED Provider Notes (Signed)
History    76yF with dyspnea. Gradual onset a few days ago. Evaluated 2d ago for same and diagnosed with bronchitis. Scripts for albuterol, atrovent and prednisone. Did not fill prednisone. Symptoms have continued to worsen is which way came to ED. No fever or chills. Denies pain. Hx of COPD. Former smoker. No unusual leg pain or swelling. Denies hx of blood clot. No n/v. No sick contacts.   CSN: [id number]  Arrival date & time 07/08/11  4098   First MD Initiated Contact with Patient 07/08/11 0848      Chief Complaint  Patient presents with  . Shortness of Breath    (Consider location/radiation/quality/duration/timing/severity/associated sxs/prior treatment) HPI  Past Medical History  Diagnosis Date  . PVD (peripheral vascular disease)   . Actinomycosis, cervicofacial   . Emphysema   . COPD (chronic obstructive pulmonary disease)   . Candidiasis, vagina   . HTN (hypertension)     Past Surgical History  Procedure Date  . Appendectomy   . Tonsillectomy     Family History  Problem Relation Age of Onset  . Coronary artery disease      family hx of 1st. degree relative <60  . Coronary artery disease      family hx of female 1st. degree relative <50  . Emphysema Father   . Emphysema      3 sisters. they were smokers  . Stomach cancer Sister     History  Substance Use Topics  . Smoking status: Former Smoker    Types: Cigarettes    Quit date: 07/11/2005  . Smokeless tobacco: Not on file   Comment: smoked for 48 years 1 ppd  . Alcohol Use: No    OB History    Grav Para Term Preterm Abortions TAB SAB Ect Mult Living                  Review of Systems   Review of symptoms negative unless otherwise noted in HPI.   Allergies  Penicillins and Pneumovax  Home Medications   Current Outpatient Rx  Name Route Sig Dispense Refill  . ALBUTEROL SULFATE (2.5 MG/3ML) 0.083% IN NEBU Nebulization Take 2.5 mg by nebulization every 6 (six) hours as needed. For wheezing     . VITAMIN C PO Oral Take 1 tablet by mouth daily.     . ASPIRIN 81 MG PO TABS Oral Take 81 mg by mouth daily.      . ATORVASTATIN CALCIUM 10 MG PO TABS Oral Take 10 mg by mouth daily.      . BUDESONIDE-FORMOTEROL FUMARATE 160-4.5 MCG/ACT IN AERO Inhalation Inhale 2 puffs into the lungs 2 (two) times daily.      Marland Kitchen CALCIUM CARBONATE-VITAMIN D 500-200 MG-UNIT PO TABS Oral Take 1 tablet by mouth 2 (two) times daily.      . COQ-10 PO Oral Take 1 tablet by mouth daily.     . IPRATROPIUM BROMIDE 0.02 % IN SOLN Nebulization Take 2.5 mLs (500 mcg total) by nebulization 4 (four) times daily. 75 mL 0  . METOPROLOL TARTRATE 25 MG PO TABS Oral Take 12.5 mg by mouth 2 (two) times daily. Take 1/2 tablet twice a day    . ONE-DAILY MULTI VITAMINS PO TABS Oral Take 1 tablet by mouth daily.      . B COMPLEX PLUS VITAMIN C PO Oral Take 1 tablet by mouth daily.      . OXYBUTYNIN CHLORIDE ER 15 MG PO TB24 Oral Take 15 mg by mouth  as needed. For overactive bladder.    Marland Kitchen PREDNISONE 10 MG PO TABS Oral Take 10-50 mg by mouth daily. Take 5 tabs today, then 4 tabs day 2, then 3 tabs day 3, then 2 tabs day 4, then 1 tab day 5     . TIOTROPIUM BROMIDE MONOHYDRATE 18 MCG IN CAPS Inhalation Place 18 mcg into inhaler and inhale daily.      Marland Kitchen VITAMIN E PO Oral Take 1 tablet by mouth daily.       BP 177/121  Pulse 140  Temp(Src) 97.9 F (36.6 C) (Oral)  Resp 28  SpO2 100%  Physical Exam  Nursing note and vitals reviewed. Constitutional: She appears well-developed. She appears distressed.       Pt with increased WOB. Tripodding.  HENT:  Head: Normocephalic and atraumatic.  Nose: Nose normal.  Mouth/Throat: Oropharynx is clear and moist.  Eyes: Conjunctivae are normal. Pupils are equal, round, and reactive to light. Right eye exhibits no discharge. Left eye exhibits no discharge.  Neck: Neck supple.  Cardiovascular: Regular rhythm and normal heart sounds.  Exam reveals no gallop and no friction rub.   No murmur  heard.      tachycardic  Pulmonary/Chest: She is in respiratory distress. She has wheezes. She exhibits no tenderness.       Sitting forward in bed and tripoding. Expiratory wheezing diffusely. Accessory muscle use. Speaking in 2-3 words sentences.  Abdominal: Soft. She exhibits no distension. There is no tenderness.  Musculoskeletal: Normal range of motion. She exhibits no tenderness.       Trace symmetric LE edema.  Neurological: She is alert.  Skin: Skin is warm and dry. No rash noted. She is not diaphoretic.  Psychiatric: Thought content normal.    ED Course  Procedures (including critical care time)  CRITICAL CARE Performed by: Raeford Razor   Total critical care time: 35 minutes  Critical care time was exclusive of separately billable procedures and treating other patients.  Critical care was necessary to treat or prevent imminent or life-threatening deterioration.  Critical care was time spent personally by me on the following activities: development of treatment plan with patient and/or surrogate as well as nursing, discussions with consultants, evaluation of patient's response to treatment, examination of patient, obtaining history from patient or surrogate, ordering and performing treatments and interventions, ordering and review of laboratory studies, ordering and review of radiographic studies, pulse oximetry and re-evaluation of patient's condition.  Labs Reviewed  BASIC METABOLIC PANEL - Abnormal; Notable for the following:    Glucose, Bld 188 (*)    GFR calc non Af Amer 86 (*)    All other components within normal limits  CREATININE, SERUM - Abnormal; Notable for the following:    GFR calc non Af Amer 88 (*)    All other components within normal limits  BASIC METABOLIC PANEL - Abnormal; Notable for the following:    Glucose, Bld 127 (*)    All other components within normal limits  POCT I-STAT 3, BLOOD GAS (G3+) - Abnormal; Notable for the following:    pH,  Arterial 7.410 (*)    pO2, Arterial 181.0 (*)    Bicarbonate 25.6 (*)    All other components within normal limits  CBC  POCT I-STAT TROPONIN I  CBC  CBC  MRSA PCR SCREENING  I-STAT TROPONIN I   Dg Chest Portable 1 View  07/08/2011  *RADIOLOGY REPORT*  Clinical Data: Short of breath.  Bronchitis.  COPD exacerbation.  PORTABLE  CHEST - 1 VIEW  Comparison: 05/14/2010 and previous  Findings: Artifact overlies chest.  Heart size is normal.  Lungs are chronically hyperinflated.  There is a question of basilar infiltrate, particularly on the left.  Two-view chest radiography would be suggested when able.  No evidence of heart failure or effusion.  IMPRESSION: Chronic obstructive lung disease.  Question basilar infiltrate particularly on the left.  This is not definite.  Consider two-view chest radiography when able.  Original Report Authenticated By: Thomasenia Sales, M.D.    10:27 AM Pt reassessed. Weaned to 2L [place] currently. Decreased WOB. Subjectively feels a little better. CXR consistent with COPD.  Continue nebs and observation at this time.  1. COPD exacerbation   2. Respiratory distress. 3. Status asthmaticus   MDM  76yf with respiratory distress. Suspect related to exacerbation of obstructive lung dz. Pt with multiple nebs and steroids. Closely observed in ED for extended period of time and does seems improved. Pt desats into 80s with minimal exertion though such as just ambulating to bathroom. Admit for further tx and eval.        Raeford Razor, MD 07/09/11 [phone]

## 2011-07-08 NOTE — ED Notes (Signed)
Pt extremely tac hypnic after ambulating to bathroom. o2 sats remained wnl but pt's HR elevated to 130's. Denies pain. Remains sob. PA aware.

## 2011-07-08 NOTE — ED Notes (Signed)
Resp. Therapist at bedside to place pt on bipap

## 2011-07-08 NOTE — ED Notes (Signed)
Pt presents to er from home with severe shortness of breath and wheezing. She was recently diagnosed with bronchitis by her pcp and was started on steroids and breathing treatment for this. Pt states that the shortness of breath has been getting worse. ems gave her a total of 10mg  albuterol with ems. Upon arrival pt is sitting upright and is unable to recline or speak in complete sentances. Iv started pta by ems.

## 2011-07-08 NOTE — H&P (Signed)
PCP:   Herb Grays, MD, MD   Chief Complaint:  Shortness of breath  HPI: Patient is a 76 year old white female with past medical history of COPD with emphysema and hypertension who for the past 5-6 days of progressively worsening dyspnea on exertion, wheezing and a mild nonproductive cough. Her breathing was so rough that she could not take it anymore and came into the emergency room today for further evaluation. Previous day she was evaluated 2 days ago and diagnosed with bronchitis. At that time she was given prescriptions for albuterol and Atrovent and prednisone. She did not fill the prednisone and her symptoms worsen that's when she came in.                  In the emergency room she was noted to be in respiratory distress with audible wheezing and difficulty speaking and more than 2-3 words. Her oxygen saturations were low requiring 100% aerosol mask just keep her sats greater than 92%. She is also noted to be significantly tachycardic with a heart rate in the 140s. Patient received doses of magnesium sulfate, Solu-Medrol and multiple nebulizer treatments. She then started to ease off to the point where she was able to breathe more comfortably.   after some point on the facemask she started to get tired and her oxygen saturations dropped and she was switched over to BiPAP.    When I saw the patient in the emergency room she was currently on BiPAP. She is feeling much better, denying any shortness of breath as long as she was on BiPAP. She denied any headaches, vision changes, dysphasia, chest pain, palpitations. She did have some wheezing and she said a cough but mostly nonproductive. She looked to be much more comfortable was able to speak in whole sentences him to be alert and oriented. She denied any abdominal pain, hematuria, dysuria, constipation, diarrhea, focal extremity numbness weakness or pain, her review of systems is otherwise negative.    Past Medical History: Past Medical History    Diagnosis Date  . PVD (peripheral vascular disease)   . Actinomycosis, cervicofacial   . Emphysema   . COPD (chronic obstructive pulmonary disease)   . Candidiasis, vagina   . HTN (hypertension)    Past Surgical History  Procedure Date  . Appendectomy   . Tonsillectomy     Medications: Prior to Admission medications   Medication Sig Start Date End Date Taking? Authorizing Provider  albuterol (PROVENTIL) (2.5 MG/3ML) 0.083% nebulizer solution Take 2.5 mg by nebulization every 6 (six) hours as needed. For wheezing   Yes Historical Provider, MD  Ascorbic Acid (VITAMIN C PO) Take 1 tablet by mouth daily.    Yes Historical Provider, MD  aspirin 81 MG tablet Take 81 mg by mouth daily.     Yes Historical Provider, MD  atorvastatin (LIPITOR) 10 MG tablet Take 10 mg by mouth daily.     Yes Historical Provider, MD  budesonide-formoterol (SYMBICORT) 160-4.5 MCG/ACT inhaler Inhale 2 puffs into the lungs 2 (two) times daily.     Yes Historical Provider, MD  calcium-vitamin D (OSCAL WITH D) 500-200 MG-UNIT per tablet Take 1 tablet by mouth 2 (two) times daily.     Yes Historical Provider, MD  Coenzyme Q10 (COQ-10 PO) Take 1 tablet by mouth daily.    Yes Historical Provider, MD  ipratropium (ATROVENT) 0.02 % nebulizer solution Take 2.5 mLs (500 mcg total) by nebulization 4 (four) times daily. 07/06/11 07/05/12 Yes Dawn Vidal Schwalbe, PA  metoprolol tartrate (LOPRESSOR) 25 MG tablet Take 12.5 mg by mouth 2 (two) times daily. Take 1/2 tablet twice a day   Yes Historical Provider, MD  Multiple Vitamin (MULTIVITAMIN) tablet Take 1 tablet by mouth daily.     Yes Historical Provider, MD  Multiple Vitamins-Minerals (B COMPLEX PLUS VITAMIN C PO) Take 1 tablet by mouth daily.     Yes Historical Provider, MD  oxybutynin (DITROPAN XL) 15 MG 24 hr tablet Take 15 mg by mouth as needed. For overactive bladder.   Yes Historical Provider, MD  predniSONE (DELTASONE) 10 MG tablet Take 10-50 mg by mouth daily. Take 5 tabs  today, then 4 tabs day 2, then 3 tabs day 3, then 2 tabs day 4, then 1 tab day 5  07/07/11 07/11/11 Yes Dawn Vidal Schwalbe, PA  tiotropium (SPIRIVA) 18 MCG inhalation capsule Place 18 mcg into inhaler and inhale daily.     Yes Historical Provider, MD  VITAMIN E PO Take 1 tablet by mouth daily.    Yes Historical Provider, MD    Allergies:   Allergies  Allergen Reactions  . Penicillins Hives    Has taken it since.  . Pneumovax (Pneumococcal Polysaccharides) Hives    Social History:  reports that she quit smoking about 5 years ago. Her smoking use included Cigarettes. She does not have any smokeless tobacco history on file. She reports that she does not drink alcohol or use illicit drugs. The patient is normally at baseline able to  ambulate without assistance. She is able to participate normal activities of daily living and she's not on home oxygen. .  Family History: Family History  Problem Relation Age of Onset  . Coronary artery disease      family hx of 1st. degree relative <60  . Coronary artery disease      family hx of female 1st. degree relative <50  . Emphysema Father   . Emphysema      3 sisters. they were smokers  . Stomach cancer Sister     Physical Exam: Filed Vitals:   07/08/11 1730 07/08/11 1800 07/08/11 1825 07/08/11 1900  BP: 131/83 134/68  151/93  Pulse: 120 116 116 111  Temp:      TempSrc:      Resp: 21 19  22   Height:  5\' 1"  (1.549 m)    Weight:  57.6 kg (126 lb 15.8 oz)    SpO2: 99% 99% 100% 100%    general: Alert and oriented x3, in no acute distress while she is on BiPAP  HEENT: Patient currently is wearing a BiPAP mask Cardiovascular: Regular rhythm, mild tachycardia Lungs: I'm not able to appreciate wheezes or crackles, she has very decreased breath sounds throughout her entire lung fields Abdomen: Soft, nontender, nondistended, decreased bowel sounds Extremities: No clubbing or cyanosis, trace pitting edema   Labs on Admission:   [facility]  07/08/11 0855  NA 135  K 4.0  CL 97  CO2 28  GLUCOSE 188*  BUN 13  CREATININE 0.64  CALCIUM 9.0  MG --  PHOS --    Basename 07/08/11 0855  WBC 9.5  NEUTROABS --  HGB 14.4  HCT 43.2  MCV 94.5  PLT 309    Radiological Exams on Admission: Dg Chest Portable 1 View  07/08/2011   IMPRESSION: Chronic obstructive lung disease.  Question basilar infiltrate particularly on the left.  This is not definite.  Consider two-view chest radiography when able.     Assessment/Plan Present on Admission:  .  PVD: Currently stable continue aspirin.  .EMPHYSEMA NEC: See below  .Carotid artery disease: Stable.  Marland KitchenHTN (hypertension): Currently on by mouth beta blocker. We'll try to limit albuterol and use Xopenex.  Marland KitchenCOPD with exacerbation: Check a stat ABG to see if we can get her off of BiPAP. We'll start her on IV steroids plus antibiotics plus nebulizer treatments and oxygen. If patient is not improving, consult pulmonary.   After discussion with the patient,  she  is to be a  full code .  We will respect these wishes.  I anticipate her length of stay to be  3-4 days * based on response to medication treatments.   Time spent on this patient including examination and decision-making process: 55 minutes.Hollice Espy [phone] 07/08/2011, 7:56 PM

## 2011-07-08 NOTE — ED Provider Notes (Signed)
2:17 PM Assumed care of patient in the CDU.  Patient is currently on bronchospasm protocol.  Patient reports that she is comfortable at this time and does not feel that she needs a breathing treatment.  Her symptoms are improving.  Patient is currently eating lunch.  Patient has been given 2 breathing treatments and 125mg  Solumedrol prior to coming to CDU.  In the CDU she was given Magnesium IV.  Patient is currently on 2L [place] oxygen with oxygen sats 97.  She is not on oxygen at home.  Patient alert and orientated.  No acute distress.  Patient speaking in full sentences and not using any accessory muscles to breathe.  Heart tachycardic, regular rhythm, Lungs mild diffuse expiratory wheezing.  3:58 PM Reassessed patient.  Patient walked to the restroom.  Patient is now having increased work with breathing.   She is unable to say more than 2-3 words.  She is using accessory muscles to breath and pursing her lips.  Heart tachycardic, regular rhythm, Lungs-decreased breath sounds, diffuse expiratory wheezing.    4:00 PM Consulted [facility] to admit patient.  Spoke with Dr. Neoma Laming who will admit patient.   Pascal Lux [facility] 07/08/11 1814

## 2011-07-08 NOTE — ED Notes (Signed)
Respiratory therapy notified for need of second breathing treatment.

## 2011-07-08 NOTE — ED Notes (Signed)
Pt to be admitted.

## 2011-07-08 NOTE — ED Notes (Signed)
Received report from [place]. Pt found in room with labored breathing and accessory muscle use. Pt states she's been very out of breath since walking to the bathroom about ago. Lung sounds very diminished with a faint wheeze. Sats 96%. HR 120s-130s. Dr. Juleen China made aware. New orders received for Bipap.

## 2011-07-08 NOTE — ED Notes (Signed)
Attempted report to RN on 2900 but RN unavailable.

## 2011-07-08 NOTE — Progress Notes (Signed)
ANTICOAGULATION CONSULT NOTE - Initial Consult  Pharmacy Consult for Lovenox Indication: VTE prophylaxis  Allergies  Allergen Reactions  . Penicillins Hives    Has taken it since.  . Pneumovax (Pneumococcal Polysaccharides) Hives    Patient Measurements: Height: 5\' 1"  (154.9 cm) Weight: 126 lb 15.8 oz (57.6 kg) IBW/kg (Calculated) : 47.8   Vital Signs: Temp: 97.9 F (36.6 C) (12/28 0849) Temp src: Oral (12/28 0849) BP: 151/93 mmHg (12/28 1900) Pulse Rate: 111  (12/28 1900)  Labs:  Basename 07/08/11 0855  HGB 14.4  HCT 43.2  PLT 309  APTT --  LABPROT --  INR --  HEPARINUNFRC --  CREATININE 0.64  CKTOTAL --  CKMB --  TROPONINI --   Estimated Creatinine Clearance: 50.4 ml/min (by C-G formula based on Cr of 0.64).  Medical History: Past Medical History  Diagnosis Date  . PVD (peripheral vascular disease)   . Actinomycosis, cervicofacial   . Emphysema   . COPD (chronic obstructive pulmonary disease)   . Candidiasis, vagina   . HTN (hypertension)     Medications:  Scheduled:    . albuterol  10 mg Nebulization Once  . albuterol  5 mg Nebulization Once  . albuterol  5 mg Nebulization Once  . albuterol      . aspirin EC  81 mg Oral Daily  . enoxaparin (LOVENOX) injection  40 mg Subcutaneous Q24H  . ipratropium  0.5 mg Nebulization Once  . ipratropium  0.5 mg Nebulization Once  . ipratropium  0.5 mg Nebulization Q6H  . levalbuterol  0.63 mg Nebulization Q6H  . magnesium sulfate 1 - 4 g bolus IVPB  2 g Intravenous Once  . methylPREDNISolone (SOLU-MEDROL) injection  125 mg Intravenous Once  . methylPREDNISolone (SOLU-MEDROL) injection  60 mg Intravenous Q12H  . metoprolol tartrate  12.5 mg Oral BID  . moxifloxacin  400 mg Intravenous Q24H  . mulitivitamin with minerals  1 tablet Oral Daily  . simvastatin  20 mg Oral Daily  . DISCONTD: albuterol  5 mg Nebulization Q4H  . DISCONTD: aspirin  81 mg Oral Daily  . DISCONTD: enoxaparin  30 mg Subcutaneous Q24H    . DISCONTD: magnesium sulfate  1 g Intravenous Once  . DISCONTD: multivitamin  1 tablet Oral Daily    Assessment: 76yo female with URI & COPD exacerbation, to receive Lovenox for VTE px with Rx to adjust.  Lovenox 30mg  SQ daily originally ordered.  Pt with adequate renal fxn, no adjustment necessary.  Plan:  Lovenox 40mg  SQ qHS  [name] P 07/08/2011,8:07 PM

## 2011-07-08 NOTE — ED Notes (Signed)
[id number] READY

## 2011-07-09 DIAGNOSIS — J9600 Acute respiratory failure, unspecified whether with hypoxia or hypercapnia: Principal | ICD-10-CM | POA: Diagnosis present

## 2011-07-09 LAB — CBC
HCT: 40.1 % (ref 36.0–46.0)
Hemoglobin: 13.4 g/dL (ref 12.0–15.0)
MCV: 93.9 fL (ref 78.0–100.0)
RDW: 13 % (ref 11.5–15.5)
WBC: 9.5 10*3/uL (ref 4.0–10.5)

## 2011-07-09 LAB — BASIC METABOLIC PANEL
BUN: 9 mg/dL (ref 6–23)
CO2: 27 mEq/L (ref 19–32)
Chloride: 100 mEq/L (ref 96–112)
Creatinine, Ser: 0.54 mg/dL (ref 0.50–1.10)
GFR calc Af Amer: >90 mL/min (ref >90)
Potassium: 4.1 mEq/L (ref 3.5–5.1)

## 2011-07-09 MED ORDER — GUAIFENESIN ER 600 MG PO TB12
1200.0000 mg | ORAL_TABLET | Freq: Two times a day (BID) | ORAL | Status: DC
  Administered 2011-07-09 – 2011-07-15 (×13): 1200 mg via ORAL
  Filled 2011-07-09 (×14): qty 2

## 2011-07-09 MED ORDER — METHYLPREDNISOLONE SODIUM SUCC 125 MG IJ SOLR
60.0000 mg | Freq: Three times a day (TID) | INTRAMUSCULAR | Status: DC
  Administered 2011-07-09 – 2011-07-13 (×14): 60 mg via INTRAVENOUS
  Filled 2011-07-09: qty 2
  Filled 2011-07-09: qty 0.96
  Filled 2011-07-09: qty 2
  Filled 2011-07-09 (×2): qty 0.96
  Filled 2011-07-09: qty 2
  Filled 2011-07-09 (×9): qty 0.96

## 2011-07-09 NOTE — Progress Notes (Signed)
DAILY PROGRESS NOTE                              [facility]  SUBJECTIVE: Patient feels much better, she is off of the BiPAP. She is on 6 L of oxygen per nasal cannula we will try to titrate down.  OBJECTIVE: BP 153/80  Pulse 95  Temp(Src) 97.3 F (36.3 C) (Oral)  Resp 19  Ht 5\' 1"  (1.549 m)  Wt 56 kg (123 lb 7.3 oz)  BMI 23.33 kg/m2  SpO2 99%  Intake/Output Summary (Last 24 hours) at 07/09/11 0850 Last data filed at 07/09/11 0800  Gross per 24 hour  Intake    250 ml  Output   1700 ml  Net  -1450 ml                      Weight change:  Physical Exam: General: Alert and awake oriented x3 not in any acute distress. HEENT: anicteric sclera, pupils equal reactive to light and accommodation CVS: S1-S2 heard, no murmur rubs or gallops Chest: clear to auscultation bilaterally, no wheezing rales or rhonchi Abdomen:  normal bowel sounds, soft, nontender, nondistended, no organomegaly Neuro: Cranial nerves II-XII intact, no focal neurological deficits Extremities: no cyanosis, no clubbing or edema noted bilaterally   Lab Results:  Basename 07/09/11 0600 07/08/11 2001 07/08/11 0855  NA 136 -- 135  K 4.1 -- 4.0  CL 100 -- 97  CO2 27 -- 28  GLUCOSE 127* -- 188*  BUN 9 -- 13  CREATININE 0.54 0.59 --  CALCIUM 8.5 -- 9.0  MG -- -- --  PHOS -- -- --   Basename 07/09/11 0600 07/08/11 2001  WBC 9.5 8.1  NEUTROABS -- --  HGB 13.4 13.8  HCT 40.1 40.5  MCV 93.9 93.8  PLT 315 320   Micro Results: Recent Results (from the past 240 hour(s))  MRSA PCR SCREENING     Status: Normal   Collection Time   07/08/11  9:50 PM      Component Value Range Status Comment   MRSA by PCR NEGATIVE  NEGATIVE  Final     Studies/Results: Dg Chest Portable 1 View  07/08/2011  *RADIOLOGY REPORT*  Clinical Data: Short of breath.  Bronchitis.  COPD exacerbation.  PORTABLE CHEST - 1 VIEW  Comparison: 05/14/2010 and previous  Findings: Artifact overlies chest.  Heart  size is normal.  Lungs are chronically hyperinflated.  There is a question of basilar infiltrate, particularly on the left.  Two-view chest radiography would be suggested when able.  No evidence of heart failure or effusion.  IMPRESSION: Chronic obstructive lung disease.  Question basilar infiltrate particularly on the left.  This is not definite.  Consider two-view chest radiography when able.  Original Report Authenticated By: Thomasenia Sales, M.D.   Medications: Scheduled Meds:   . albuterol  10 mg Nebulization Once  . albuterol  5 mg Nebulization Once  . albuterol  5 mg Nebulization Once  . albuterol      . aspirin EC  81 mg Oral Daily  . enoxaparin (LOVENOX) injection  40 mg Subcutaneous Q24H  . ipratropium  0.5 mg Nebulization Once  . ipratropium  0.5 mg Nebulization Once  . ipratropium  0.5 mg Nebulization Q6H  . levalbuterol  0.63 mg Nebulization Q6H  . magnesium sulfate 1 - 4 g bolus IVPB  2 g Intravenous Once  .  methylPREDNISolone (SOLU-MEDROL) injection  125 mg Intravenous Once  . methylPREDNISolone (SOLU-MEDROL) injection  60 mg Intravenous Q12H  . metoprolol tartrate  12.5 mg Oral BID  . moxifloxacin  400 mg Intravenous NOW  . moxifloxacin  400 mg Intravenous Q24H  . mulitivitamin with minerals  1 tablet Oral Daily  . simvastatin  20 mg Oral Daily  . DISCONTD: albuterol  5 mg Nebulization Q4H  . DISCONTD: aspirin  81 mg Oral Daily  . DISCONTD: enoxaparin  30 mg Subcutaneous Q24H  . DISCONTD: magnesium sulfate  1 g Intravenous Once  . DISCONTD: moxifloxacin  400 mg Intravenous Q24H  . DISCONTD: multivitamin  1 tablet Oral Daily   Continuous Infusions:  PRN Meds:.acetaminophen, acetaminophen, levalbuterol, morphine, ondansetron (ZOFRAN) IV, ondansetron, oxybutynin, oxyCODONE  ASSESSMENT & PLAN: Principal Problem:  *COPD with exacerbation Active Problems:  PVD  EMPHYSEMA NEC  Carotid artery disease  HTN (hypertension)  Acute respiratory failure  1. Acute  respiratory failure: This is secondary to a COPD exacerbation. Patient was on BiPAP last night and she improved. She is on the high flow oxygen via nasal cannula now. Patient is still work very hard for her breathing. And I hope she will not need the BiPAP again if respiratory muscle fatigue happened.  2. COPD exacerbation: Patient is on bronchodilators, steroids, mucolytics, IV antibiotics and supplemental oxygen. Patient is still wheezing we'll continue the steroids.  3. HTN: Stable continue home medications.  4. PVD: This is stable we'll continue patient on medications.   LOS: 1 day   [name] A 07/09/2011, 8:50 AM

## 2011-07-09 NOTE — ED Provider Notes (Signed)
Medical screening examination/treatment/procedure(s) were conducted as a shared visit with non-physician practitioner(s) and myself.  I personally evaluated the patient during the encounter.  See completed H&P for this visit.  Raeford Razor, MD 07/09/11 [phone]

## 2011-07-10 LAB — BASIC METABOLIC PANEL
BUN: 15 mg/dL (ref 6–23)
CO2: 28 mEq/L (ref 19–32)
Chloride: 97 mEq/L (ref 96–112)
GFR calc non Af Amer: >90 mL/min (ref >90)
Glucose, Bld: 127 mg/dL — ABNORMAL HIGH (ref 70–99)
Potassium: 4.2 mEq/L (ref 3.5–5.1)
Sodium: 136 mEq/L (ref 135–145)

## 2011-07-10 LAB — CBC
HCT: 41 % (ref 36.0–46.0)
Hemoglobin: 14.1 g/dL (ref 12.0–15.0)
MCH: 31.9 pg (ref 26.0–34.0)
MCHC: 34.4 g/dL (ref 30.0–36.0)
MCV: 92.8 fL (ref 78.0–100.0)
RBC: 4.42 MIL/uL (ref 3.87–5.11)

## 2011-07-10 MED ORDER — ALPRAZOLAM 0.25 MG PO TABS
0.2500 mg | ORAL_TABLET | Freq: Three times a day (TID) | ORAL | Status: DC | PRN
  Administered 2011-07-10 – 2011-07-15 (×9): 0.25 mg via ORAL
  Filled 2011-07-10 (×10): qty 1

## 2011-07-10 NOTE — Progress Notes (Signed)
Pt.was SOB and had some upper airway wheezing. RN  Requested tx. For pt.

## 2011-07-10 NOTE — Progress Notes (Signed)
DAILY PROGRESS NOTE                              [facility]  SUBJECTIVE: Was on 2 L of oxygen last night, woke up this morning and she wanted to go to the bathroom and she got wheezy. At that time her saturation was still in the high 90s on 2 L of oxygen. BiPAP was restarted.  OBJECTIVE: BP 152/80  Pulse 93  Temp(Src) 98 F (36.7 C) (Oral)  Resp 24  Ht 5\' 1"  (1.549 m)  Wt 56 kg (123 lb 7.3 oz)  BMI 23.33 kg/m2  SpO2 99%  Intake/Output Summary (Last 24 hours) at 07/10/11 0831 Last data filed at 07/09/11 1500  Gross per 24 hour  Intake    240 ml  Output    450 ml  Net   -210 ml                      Weight change:  Physical Exam: General: Alert and awake oriented x3 not in any acute distress. HEENT: anicteric sclera, pupils equal reactive to light and accommodation CVS: S1-S2 heard, no murmur rubs or gallops Chest: Faint wheezing bilaterally Abdomen:  normal bowel sounds, soft, nontender, nondistended, no organomegaly Neuro: Cranial nerves II-XII intact, no focal neurological deficits Extremities: no cyanosis, no clubbing or edema noted bilaterally   Lab Results:  Basename 07/10/11 0650 07/09/11 0600  NA 136 136  K 4.2 4.1  CL 97 100  CO2 28 27  GLUCOSE 127* 127*  BUN 15 9  CREATININE 0.53 0.54  CALCIUM 9.1 8.5  MG -- --  PHOS -- --    Basename 07/10/11 0650 07/09/11 0600  WBC 12.5* 9.5  NEUTROABS -- --  HGB 14.1 13.4  HCT 41.0 40.1  MCV 92.8 93.9  PLT 335 315   Micro Results: Recent Results (from the past 240 hour(s))  MRSA PCR SCREENING     Status: Normal   Collection Time   07/08/11  9:50 PM      Component Value Range Status Comment   MRSA by PCR NEGATIVE  NEGATIVE  Final     Studies/Results: Dg Chest Portable 1 View  07/08/2011  *RADIOLOGY REPORT*  Clinical Data: Short of breath.  Bronchitis.  COPD exacerbation.  PORTABLE CHEST - 1 VIEW  Comparison: 05/14/2010 and previous  Findings: Artifact overlies chest.   Heart size is normal.  Lungs are chronically hyperinflated.  There is a question of basilar infiltrate, particularly on the left.  Two-view chest radiography would be suggested when able.  No evidence of heart failure or effusion.  IMPRESSION: Chronic obstructive lung disease.  Question basilar infiltrate particularly on the left.  This is not definite.  Consider two-view chest radiography when able.  Original Report Authenticated By: Thomasenia Sales, M.D.   Medications: Scheduled Meds:    . aspirin EC  81 mg Oral Daily  . enoxaparin (LOVENOX) injection  40 mg Subcutaneous Q24H  . guaiFENesin  1,200 mg Oral BID  . ipratropium  0.5 mg Nebulization Q6H  . levalbuterol  0.63 mg Nebulization Q6H  . methylPREDNISolone (SOLU-MEDROL) injection  60 mg Intravenous Q8H  . metoprolol tartrate  12.5 mg Oral BID  . moxifloxacin  400 mg Intravenous Q24H  . mulitivitamin with minerals  1 tablet Oral Daily  . simvastatin  20 mg Oral Daily  . DISCONTD: methylPREDNISolone (SOLU-MEDROL) injection  60  mg Intravenous Q12H   Continuous Infusions:  PRN Meds:.acetaminophen, acetaminophen, levalbuterol, morphine, ondansetron (ZOFRAN) IV, ondansetron, oxybutynin, oxyCODONE  ASSESSMENT & PLAN: Principal Problem:  *COPD with exacerbation Active Problems:  PVD  EMPHYSEMA NEC  Carotid artery disease  HTN (hypertension)  Acute respiratory failure  1. Acute respiratory failure: This is secondary to a COPD exacerbation. Patient was on BiPAP on admission and she improved. She is on the high flow oxygen via nasal cannula now. Patient is still work very hard for her breathing. Needed less oxygen last night, this morning has wheezing and shortness of breath was placed back on BiPAP. I think part of this is anxiety as her saturation was 99 on 2 L of oxygen. Place her on low dose of anxiolytic. Keep on step down unit today, monitor closely for respiratory muscle fatigue.  2. COPD exacerbation: Patient is on  bronchodilators, steroids, mucolytics, IV antibiotics and supplemental oxygen. Patient is still wheezing we'll continue the steroids.  3. HTN: Stable continue home medications.  4. PVD: This is stable we'll continue patient on medications.   LOS: 2 days   Demetrias Goodbar A 07/10/2011, 8:31 AM

## 2011-07-10 NOTE — Progress Notes (Signed)
A: +incresing dyspnea with wheezing P: back on NIPPV, nebs  Joie Bimler MD, MPH

## 2011-07-10 NOTE — Progress Notes (Signed)
Pt.appeared in  Distress and SOB. Placed pt. On bipap. Pt. bipap is PRN . Pt. Is tolerating bipap well and is calming down. RT to monitor.

## 2011-07-11 LAB — BASIC METABOLIC PANEL
BUN: 17 mg/dL (ref 6–23)
CO2: 31 mEq/L (ref 19–32)
Calcium: 8.8 mg/dL (ref 8.4–10.5)
GFR calc non Af Amer: 87 mL/min — ABNORMAL LOW (ref >90)
Glucose, Bld: 148 mg/dL — ABNORMAL HIGH (ref 70–99)
Sodium: 135 mEq/L (ref 135–145)

## 2011-07-11 MED ORDER — LEVALBUTEROL TARTRATE 45 MCG/ACT IN AERO
2.0000 | INHALATION_SPRAY | Freq: Four times a day (QID) | RESPIRATORY_TRACT | Status: DC
  Administered 2011-07-11 – 2011-07-15 (×18): 2 via RESPIRATORY_TRACT
  Filled 2011-07-11: qty 15

## 2011-07-11 MED ORDER — METOPROLOL TARTRATE 25 MG PO TABS
25.0000 mg | ORAL_TABLET | Freq: Two times a day (BID) | ORAL | Status: DC
  Administered 2011-07-11: 25 mg via ORAL
  Administered 2011-07-11: 12.5 mg via ORAL
  Administered 2011-07-12 – 2011-07-15 (×7): 25 mg via ORAL
  Filled 2011-07-11 (×8): qty 1

## 2011-07-11 MED ORDER — IPRATROPIUM BROMIDE HFA 17 MCG/ACT IN AERS
2.0000 | INHALATION_SPRAY | Freq: Four times a day (QID) | RESPIRATORY_TRACT | Status: DC
  Filled 2011-07-11: qty 12.9

## 2011-07-11 MED ORDER — SODIUM CHLORIDE 0.9 % IV SOLN
INTRAVENOUS | Status: DC | PRN
  Administered 2011-07-11: 21:00:00 via INTRAVENOUS

## 2011-07-11 MED ORDER — IPRATROPIUM BROMIDE HFA 17 MCG/ACT IN AERS
2.0000 | INHALATION_SPRAY | Freq: Four times a day (QID) | RESPIRATORY_TRACT | Status: DC
  Administered 2011-07-11 – 2011-07-15 (×16): 2 via RESPIRATORY_TRACT

## 2011-07-11 NOTE — Progress Notes (Signed)
DAILY PROGRESS NOTE                              [facility]  SUBJECTIVE: Of of the BiPAP since yesterday morning. She does not require oxygen. Patient is still wheezy.  OBJECTIVE: BP 143/76  Pulse 102  Temp(Src) 97.4 F (36.3 C) (Oral)  Resp 22  Ht 5\' 1"  (1.549 m)  Wt 56.2 kg (123 lb 14.4 oz)  BMI 23.41 kg/m2  SpO2 96%  Intake/Output Summary (Last 24 hours) at 07/11/11 2130 Last data filed at 07/11/11 0600  Gross per 24 hour  Intake   1570 ml  Output   2100 ml  Net   -530 ml                      Weight change:  Physical Exam: General: Alert and awake oriented x3 not in any acute distress. HEENT: anicteric sclera, pupils equal reactive to light and accommodation CVS: S1-S2 heard, no murmur rubs or gallops Chest: Faint wheezing bilaterally Abdomen:  normal bowel sounds, soft, nontender, nondistended, no organomegaly Neuro: Cranial nerves II-XII intact, no focal neurological deficits Extremities: no cyanosis, no clubbing or edema noted bilaterally   Lab Results:  [facility] 07/11/11 0450 07/10/11 0650  NA 135 136  K 4.7 4.2  CL 97 97  CO2 31 28  GLUCOSE 148* 127*  BUN 17 15  CREATININE 0.62 0.53  CALCIUM 8.8 9.1  MG -- --  PHOS -- --    Basename 07/10/11 0650 07/09/11 0600  WBC 12.5* 9.5  NEUTROABS -- --  HGB 14.1 13.4  HCT 41.0 40.1  MCV 92.8 93.9  PLT 335 315   Micro Results: Recent Results (from the past 240 hour(s))  MRSA PCR SCREENING     Status: Normal   Collection Time   07/08/11  9:50 PM      Component Value Range Status Comment   MRSA by PCR NEGATIVE  NEGATIVE  Final     Studies/Results: Dg Chest Portable 1 View  07/08/2011  *RADIOLOGY REPORT*  Clinical Data: Short of breath.  Bronchitis.  COPD exacerbation.  PORTABLE CHEST - 1 VIEW  Comparison: 05/14/2010 and previous  Findings: Artifact overlies chest.  Heart size is normal.  Lungs are chronically hyperinflated.  There is a question of basilar infiltrate,  particularly on the left.  Two-view chest radiography would be suggested when able.  No evidence of heart failure or effusion.  IMPRESSION: Chronic obstructive lung disease.  Question basilar infiltrate particularly on the left.  This is not definite.  Consider two-view chest radiography when able.  Original Report Authenticated By: Thomasenia Sales, M.D.   Medications: Scheduled Meds:    . aspirin EC  81 mg Oral Daily  . enoxaparin (LOVENOX) injection  40 mg Subcutaneous Q24H  . guaiFENesin  1,200 mg Oral BID  . ipratropium  0.5 mg Nebulization Q6H  . levalbuterol  0.63 mg Nebulization Q6H  . methylPREDNISolone (SOLU-MEDROL) injection  60 mg Intravenous Q8H  . metoprolol tartrate  12.5 mg Oral BID  . moxifloxacin  400 mg Intravenous Q24H  . mulitivitamin with minerals  1 tablet Oral Daily  . simvastatin  20 mg Oral Daily   Continuous Infusions:  PRN Meds:.acetaminophen, acetaminophen, ALPRAZolam, levalbuterol, morphine, ondansetron (ZOFRAN) IV, ondansetron, oxybutynin, oxyCODONE  ASSESSMENT & PLAN: Principal Problem:  *COPD with exacerbation Active Problems:  PVD  EMPHYSEMA NEC  Carotid artery  disease  HTN (hypertension)  Acute respiratory failure  1. Acute respiratory failure: This is secondary to a COPD exacerbation. Patient was on BiPAP on admission and she improved. She is on the high flow oxygen via nasal cannula now. Patient is still work very hard for her breathing. Did not need supplemental oxygen since yesterday. She is still wheezy and now working at times hard for her breathing. I will transfer her out of the step down unit to telemetry bed. No oxygen her saturation more than 90%. Watch closely for respiratory muscle fatigue.  2. COPD exacerbation: Patient is on bronchodilators, steroids, mucolytics, IV antibiotics and supplemental oxygen. Patient is still wheezing we'll continue the steroids.  3. HTN: Stable continue home medications.  4. PVD: This is stable we'll  continue patient on medications.  5. Tachycardia: Patient is on metoprolol I'll increase that to 25 twice a day. Watch closely as beta blockers even the hila selective was can cause increase in wheezing.  6. Disposition: Transfer to telemetry.   LOS: 3 days   Cristoval Teall A 07/11/2011, 8:12 AM

## 2011-07-11 NOTE — Progress Notes (Signed)
Utilization review complete 

## 2011-07-12 ENCOUNTER — Inpatient Hospital Stay (HOSPITAL_COMMUNITY): Payer: Medicare Other

## 2011-07-12 LAB — CBC
HCT: 44.3 % (ref 36.0–46.0)
Platelets: 363 10*3/uL (ref 150–400)
RDW: 12.4 % (ref 11.5–15.5)
WBC: 11.5 10*3/uL — ABNORMAL HIGH (ref 4.0–10.5)

## 2011-07-12 LAB — BASIC METABOLIC PANEL
Chloride: 98 mEq/L (ref 96–112)
Creatinine, Ser: 0.55 mg/dL (ref 0.50–1.10)
GFR calc Af Amer: >90 mL/min (ref >90)
GFR calc non Af Amer: >90 mL/min (ref >90)

## 2011-07-12 MED ORDER — MOXIFLOXACIN HCL 400 MG PO TABS
400.0000 mg | ORAL_TABLET | Freq: Every day | ORAL | Status: DC
  Administered 2011-07-12 – 2011-07-14 (×3): 400 mg via ORAL
  Filled 2011-07-12 (×4): qty 1

## 2011-07-12 MED ORDER — BIOTENE DRY MOUTH MT LIQD
15.0000 mL | Freq: Two times a day (BID) | OROMUCOSAL | Status: DC
  Administered 2011-07-12 – 2011-07-15 (×6): 15 mL via OROMUCOSAL

## 2011-07-12 NOTE — Progress Notes (Signed)
Pharmacy - IV to PO conversion  77 yo F on day #5 IV Avelox for COPD exacerbation.  Pt now meets the P&T approved criteria for switching therapy to the oral route.  Has received at least 48 hours of IV therapy Afebrile x 24 hours or more Tolerating an oral diet Clinical assessment of improvement  Will change to Avelox 400mg  PO daily.  [employer], Pharm.D., BCPS Clinical Pharmacist Pager [phone]

## 2011-07-12 NOTE — Progress Notes (Signed)
DAILY PROGRESS NOTE                              [facility]  SUBJECTIVE: Patient did fine overnight, had increased work of breathing after the nebulizers. Patient wanted that to be switched to MDIs  OBJECTIVE: BP 160/98  Pulse 74  Temp(Src) 97.8 F (36.6 C) (Oral)  Resp 32  Ht 5\' 1"  (1.549 m)  Wt 56.2 kg (123 lb 14.4 oz)  BMI 23.41 kg/m2  SpO2 99%  Intake/Output Summary (Last 24 hours) at 07/12/11 0805 Last data filed at 07/12/11 0630  Gross per 24 hour  Intake 984.99 ml  Output   1500 ml  Net -515.01 ml                      Weight change:  Physical Exam: General: Alert and awake oriented x3 not in any acute distress. HEENT: anicteric sclera, pupils equal reactive to light and accommodation CVS: S1-S2 heard, no murmur rubs or gallops Chest: Faint wheezing bilaterally Abdomen:  normal bowel sounds, soft, nontender, nondistended, no organomegaly Neuro: Cranial nerves II-XII intact, no focal neurological deficits Extremities: no cyanosis, no clubbing or edema noted bilaterally   Lab Results:  [facility] 07/12/11 0642 07/11/11 0450  NA 136 135  K 4.5 4.7  CL 98 97  CO2 29 31  GLUCOSE 130* 148*  BUN 18 17  CREATININE 0.55 0.62  CALCIUM 9.1 8.8  MG -- --  PHOS -- --    Basename 07/12/11 0642 07/10/11 0650  WBC 11.5* 12.5*  NEUTROABS -- --  HGB 15.1* 14.1  HCT 44.3 41.0  MCV 92.9 92.8  PLT 363 335   Micro Results: Recent Results (from the past 240 hour(s))  MRSA PCR SCREENING     Status: Normal   Collection Time   07/08/11  9:50 PM      Component Value Range Status Comment   MRSA by PCR NEGATIVE  NEGATIVE  Final     Studies/Results: Dg Chest Portable 1 View  07/08/2011  *RADIOLOGY REPORT*  Clinical Data: Short of breath.  Bronchitis.  COPD exacerbation.  PORTABLE CHEST - 1 VIEW  Comparison: 05/14/2010 and previous  Findings: Artifact overlies chest.  Heart size is normal.  Lungs are chronically hyperinflated.  There is a  question of basilar infiltrate, particularly on the left.  Two-view chest radiography would be suggested when able.  No evidence of heart failure or effusion.  IMPRESSION: Chronic obstructive lung disease.  Question basilar infiltrate particularly on the left.  This is not definite.  Consider two-view chest radiography when able.  Original Report Authenticated By: Thomasenia Sales, M.D.   Medications: Scheduled Meds:    . antiseptic oral rinse  15 mL Mouth Rinse BID  . aspirin EC  81 mg Oral Daily  . enoxaparin (LOVENOX) injection  40 mg Subcutaneous Q24H  . guaiFENesin  1,200 mg Oral BID  . ipratropium  2 puff Inhalation QID  . levalbuterol  2 puff Inhalation Q6H  . methylPREDNISolone (SOLU-MEDROL) injection  60 mg Intravenous Q8H  . metoprolol tartrate  25 mg Oral BID  . moxifloxacin  400 mg Intravenous Q24H  . mulitivitamin with minerals  1 tablet Oral Daily  . simvastatin  20 mg Oral Daily  . DISCONTD: ipratropium  2 puff Inhalation Q6H  . DISCONTD: ipratropium  0.5 mg Nebulization Q6H  . DISCONTD: levalbuterol  0.63 mg Nebulization  Q6H  . DISCONTD: metoprolol tartrate  12.5 mg Oral BID   Continuous Infusions:  PRN Meds:.sodium chloride, acetaminophen, acetaminophen, ALPRAZolam, levalbuterol, morphine, ondansetron (ZOFRAN) IV, ondansetron, oxybutynin, oxyCODONE  ASSESSMENT & PLAN: Principal Problem:  *COPD with exacerbation Active Problems:  PVD  EMPHYSEMA NEC  Carotid artery disease  HTN (hypertension)  Acute respiratory failure  1. Acute respiratory failure: This is secondary to a COPD exacerbation. Patient was on BiPAP on admission and she improved. She is on the 2 L of oxygen via nasal cannula now. I will transfer to telemetry. I'll switch her bronchodilators nebulizer to MDI. I will repeat the chest x-ray today.  2. COPD exacerbation: Patient is on bronchodilators, steroids, mucolytics, IV antibiotics and supplemental oxygen. Patient is still wheezing we'll continue the  steroids.  3. HTN: Stable continue home medications.  4. PVD: This is stable we'll continue patient on medications.  5. Tachycardia: Patient is on metoprolol I'll increase that to 25 twice a day. Watch closely as beta blockers even the hila selective was can cause increase in wheezing.  6. Disposition: Transfer to telemetry.   LOS: 4 days   Omere Marti A 07/12/2011, 8:05 AM

## 2011-07-13 MED ORDER — METHYLPREDNISOLONE SODIUM SUCC 125 MG IJ SOLR
60.0000 mg | Freq: Two times a day (BID) | INTRAMUSCULAR | Status: DC
  Administered 2011-07-14 (×2): 60 mg via INTRAVENOUS
  Filled 2011-07-13 (×2): qty 0.96

## 2011-07-13 NOTE — Progress Notes (Signed)
Subjective: Patient seen and examined this am. Feels better today  Objective:  Vital signs in last 24 hours:  Filed Vitals:   07/13/11 1056 07/13/11 1212 07/13/11 1355 07/13/11 1555  BP: 158/76  135/72   Pulse: 99  76   Temp:   97.6 F (36.4 C)   TempSrc:      Resp:   19   Height:      Weight:      SpO2:  97% 98% 97%    Intake/Output from previous day:   Intake/Output Summary (Last 24 hours) at 07/13/11 1838 Last data filed at 07/13/11 1355  Gross per 24 hour  Intake    540 ml  Output      0 ml  Net    540 ml    Physical Exam:  General: elderly female  in no acute distress. HEENT: no pallor, no icterus, moist oral mucosa, no JVD, no lymphadenopathy Heart: Normal  s1 &s2  Regular rate and rhythm, without murmurs, rubs, gallops. Lungs: diminished breath sounds, no added sounds Abdomen: Soft, nontender, nondistended, positive bowel sounds. Extremities: No clubbing cyanosis or edema with positive pedal pulses. Neuro: Alert, awake, oriented x3, nonfocal.   Lab Results:  Basic Metabolic Panel:    Component Value Date/Time   NA 136 07/12/2011 0642   K 4.5 07/12/2011 0642   CL 98 07/12/2011 0642   CO2 29 07/12/2011 0642   BUN 18 07/12/2011 0642   CREATININE 0.55 07/12/2011 0642   GLUCOSE 130* 07/12/2011 0642   CALCIUM 9.1 07/12/2011 0642   CBC:    Component Value Date/Time   WBC 11.5* 07/12/2011 0642   HGB 15.1* 07/12/2011 0642   HCT 44.3 07/12/2011 0642   PLT 363 07/12/2011 0642   MCV 92.9 07/12/2011 0642   NEUTROABS 4.7 06/15/2010 1621   LYMPHSABS 1.9 06/15/2010 1621   MONOABS 0.7 06/15/2010 1621   EOSABS 0.1 06/15/2010 1621   BASOSABS 0.2* 06/15/2010 1621    Recent Results (from the past 240 hour(s))  MRSA PCR SCREENING     Status: Normal   Collection Time   07/08/11  9:50 PM      Component Value Range Status Comment   MRSA by PCR NEGATIVE  NEGATIVE  Final     Studies/Results: Dg Chest Port 1 View  07/12/2011  *RADIOLOGY REPORT*  Clinical Data: Shortness of breath.   History of COPD.  PORTABLE CHEST - 1 VIEW  Comparison: 07/08/2011 and 05/14/2010  Findings: Normal heart and mediastinal contours.  Stable mild prominence of the central pulmonary arteries.  The lung volumes are within normal limits.  Decreased attenuation of the pulmonary markings at the apices suggests COPD.  There is a probable vessel on end in the right upper lung field.  Follow up with two-view chest radiograph is recommended.  Negative for pneumothorax, focal airspace disease, or pleural effusion.  IMPRESSION:  1.  COPD.  No acute findings. 2.  Probable vessel on end in the right upper lung field. Pulmonary nodule is not completely excluded.  Suggest two-view chest radiograph when the patient can tolerate.  Original Report Authenticated By: Britta Mccreedy, M.D.    Medications: Scheduled Meds:   . antiseptic oral rinse  15 mL Mouth Rinse BID  . aspirin EC  81 mg Oral Daily  . enoxaparin (LOVENOX) injection  40 mg Subcutaneous Q24H  . guaiFENesin  1,200 mg Oral BID  . ipratropium  2 puff Inhalation QID  . levalbuterol  2 puff Inhalation Q6H  .  methylPREDNISolone (SOLU-MEDROL) injection  60 mg Intravenous Q8H  . metoprolol tartrate  25 mg Oral BID  . moxifloxacin  400 mg Oral q1800  . mulitivitamin with minerals  1 tablet Oral Daily  . simvastatin  20 mg Oral Daily   Continuous Infusions:  PRN Meds:.sodium chloride, acetaminophen, acetaminophen, ALPRAZolam, levalbuterol, morphine, ondansetron (ZOFRAN) IV, ondansetron, oxybutynin, oxyCODONE  Assessment/Plan: 6 female with COPD, HTN, PVD admitted for acute resp failure secondary to COPD exacerbation.   1. Acute respiratory failure:  secondary to a COPD exacerbation. Patient was on BiPAP on admission and she improved. placed on 2 L of oxygen via nasal cannula now.cont nebs  2. COPD exacerbation:  Slowly improving  cont IV  steroids, nebs and abx  3. HTN:  Stable;ontinue home medications.   4. PVD:  Stable cont home meds  5.  Tachycardia: stable once metoprolol increaseD  dvt prophylaxis    Full code   LOS: 5 days   [name] 07/13/2011, 6:38 PM

## 2011-07-13 NOTE — Progress Notes (Signed)
Utilization Review Completed.Denissa Cozart T1/08/2011   

## 2011-07-14 ENCOUNTER — Inpatient Hospital Stay (HOSPITAL_COMMUNITY): Payer: Medicare Other

## 2011-07-14 MED ORDER — PREDNISONE 50 MG PO TABS
50.0000 mg | ORAL_TABLET | Freq: Every day | ORAL | Status: DC
  Administered 2011-07-15: 50 mg via ORAL
  Filled 2011-07-14 (×2): qty 1

## 2011-07-14 MED ORDER — NYSTATIN 100000 UNIT/ML MT SUSP
5.0000 mL | Freq: Four times a day (QID) | OROMUCOSAL | Status: DC
  Administered 2011-07-14 – 2011-07-15 (×5): 500000 [IU] via ORAL
  Filled 2011-07-14 (×7): qty 5

## 2011-07-14 NOTE — Progress Notes (Signed)
Subjective: Patient seen and examined this am. informs her SOB to be slightly better  Objective:  Vital signs in last 24 hours:  Filed Vitals:   07/13/11 2150 07/14/11 0443 07/14/11 0952 07/14/11 1311  BP: 126/74 168/85 164/81 117/75  Pulse: 97 75 84 84  Temp: 98.3 F (36.8 C) 98.4 F (36.9 C)  98 F (36.7 C)  TempSrc: Oral Oral  Oral  Resp: 20 21  18   Height:      Weight:      SpO2: 99% 97%  95%    Intake/Output from previous day:   Intake/Output Summary (Last 24 hours) at 07/14/11 1557 Last data filed at 07/14/11 1300  Gross per 24 hour  Intake    596 ml  Output      0 ml  Net    596 ml    Physical Exam:  General: elderly female in no acute distress.  HEENT: no pallor, no icterus, moist oral mucosa, no JVD, no lymphadenopathy  Heart: Normal s1 &s2 Regular rate and rhythm, without murmurs, rubs, gallops.  Lungs: clear  breath sounds b/l , no added sounds  Abdomen: Soft, nontender, nondistended, positive bowel sounds.  Extremities: No clubbing cyanosis or edema with positive pedal pulses.  Neuro: Alert, awake, oriented x3, nonfocal.   Lab Results:  Basic Metabolic Panel:    Component Value Date/Time   NA 136 07/12/2011 0642   K 4.5 07/12/2011 0642   CL 98 07/12/2011 0642   CO2 29 07/12/2011 0642   BUN 18 07/12/2011 0642   CREATININE 0.55 07/12/2011 0642   GLUCOSE 130* 07/12/2011 0642   CALCIUM 9.1 07/12/2011 0642   CBC:    Component Value Date/Time   WBC 11.5* 07/12/2011 0642   HGB 15.1* 07/12/2011 0642   HCT 44.3 07/12/2011 0642   PLT 363 07/12/2011 0642   MCV 92.9 07/12/2011 0642   NEUTROABS 4.7 06/15/2010 1621   LYMPHSABS 1.9 06/15/2010 1621   MONOABS 0.7 06/15/2010 1621   EOSABS 0.1 06/15/2010 1621   BASOSABS 0.2* 06/15/2010 1621    Recent Results (from the past 240 hour(s))  MRSA PCR SCREENING     Status: Normal   Collection Time   07/08/11  9:50 PM      Component Value Range Status Comment   MRSA by PCR NEGATIVE  NEGATIVE  Final     Studies/Results: Dg Chest 2  View  07/14/2011  *RADIOLOGY REPORT*  Clinical Data: Further evaluation of possible pulmonary nodule seen on portable chest x-ray from 07/12/2011  CHEST - 2 VIEW  Comparison: 07/12/2011  Findings: Emphysematous changes and scattered fibrosis in the lungs.  Normal heart size and pulmonary vascularity.  Nodular opacity seen on previous study is not visualized on today's study, suggesting that it represented overlapping vascular shadows.  No significant pulmonary nodularity identified.  Vague nodular opacities projected over both lung bases are consistent with prominent nipple shadows.  No blunting of costophrenic angles.  No pneumothorax.  Calcification of the aorta.  Degenerative changes in the spine.  IMPRESSION: Emphysematous changes and scattered fibrosis in the lungs.  No significant pulmonary nodules identified.  No evidence of active pulmonary disease.  Original Report Authenticated By: Marlon Pel, M.D.    Medications: Scheduled Meds:   . antiseptic oral rinse  15 mL Mouth Rinse BID  . aspirin EC  81 mg Oral Daily  . enoxaparin (LOVENOX) injection  40 mg Subcutaneous Q24H  . guaiFENesin  1,200 mg Oral BID  . ipratropium  2  puff Inhalation QID  . levalbuterol  2 puff Inhalation Q6H  . methylPREDNISolone (SOLU-MEDROL) injection  60 mg Intravenous Q12H  . metoprolol tartrate  25 mg Oral BID  . moxifloxacin  400 mg Oral q1800  . mulitivitamin with minerals  1 tablet Oral Daily  . nystatin  5 mL Oral QID  . simvastatin  20 mg Oral Daily  . DISCONTD: methylPREDNISolone (SOLU-MEDROL) injection  60 mg Intravenous Q8H   Continuous Infusions:  PRN Meds:.sodium chloride, acetaminophen, acetaminophen, ALPRAZolam, levalbuterol, morphine, ondansetron (ZOFRAN) IV, ondansetron, oxybutynin, oxyCODONE  Assessment/Plan: 64 female with COPD, HTN, PVD admitted for acute resp failure secondary to COPD exacerbation.   1. Acute respiratory failure:  secondary to a COPD exacerbation. Patient was on  BiPAP on admission and she improved. placed on 2 L of oxygen via nasal cannula now.cont nebs   2. COPD exacerbation:  Slowly improving  Switch to po steroids, nebs and abx  Check need for home O2 prior to d/c  3. HTN:  Stable;ontinue home medications.   4. PVD:  Stable cont home meds   5. Tachycardia: stable once metoprolol increased  DISPO; next 1-2 days if continued to improve  LOS: 6 days   [name] 07/14/2011, 3:57 PM

## 2011-07-14 NOTE — Progress Notes (Signed)
Pt evaluated for long term disease management services with [facility] Management program as a benefit of [employer]. RN case manager will do a post d/c transition of care call and home visits for assessments for HTN, COPD and other needs.  Brooke Bonito C. Roena Malady, RN, MS, CCM [facility] Liaison [facility] [phone]

## 2011-07-15 LAB — CREATININE, SERUM
Creatinine, Ser: 0.6 mg/dL (ref 0.50–1.10)
GFR calc Af Amer: >90 mL/min (ref >90)
GFR calc non Af Amer: 88 mL/min — ABNORMAL LOW (ref >90)

## 2011-07-15 MED ORDER — MOXIFLOXACIN HCL 400 MG PO TABS: 400.0000 mg | ORAL_TABLET | Freq: Every day | ORAL | Status: AC

## 2011-07-15 MED ORDER — ESOMEPRAZOLE MAGNESIUM 40 MG PO CPDR: 40.0000 mg | DELAYED_RELEASE_CAPSULE | Freq: Every day | ORAL | Status: DC

## 2011-07-15 MED ORDER — GUAIFENESIN ER 600 MG PO TB12: 1200.0000 mg | ORAL_TABLET | Freq: Two times a day (BID) | ORAL | Status: DC

## 2011-07-15 MED ORDER — NYSTATIN 100000 UNIT/ML MT SUSP: 5.0000 mL | Freq: Four times a day (QID) | OROMUCOSAL | Status: AC

## 2011-07-15 MED ORDER — PREDNISONE (PAK) 10 MG PO TABS: 10.0000 mg | ORAL_TABLET | Freq: Every day | ORAL | Status: DC

## 2011-07-15 NOTE — Progress Notes (Signed)
07/15/11 NSG 1235 Ambulated patient on R/A and sats dropped down to 87%.  Once pt. Was back to bed sats up to 89%.  Placed O2 at 2L and sats up to 92%.  MD texted paged to inform of above.  Will continue to monitor.  Forbes Cellar, RN

## 2011-07-15 NOTE — Progress Notes (Signed)
07/15/11 NSG 1235 Pt. O2 sats before ambulation was 90% on r/a.  Will continue to monitor.  Forbes Cellar, RN

## 2011-07-15 NOTE — Progress Notes (Signed)
07/15/11 NSG 1605 Patient discharged to home with family, discharged instructions given and reviewed with patient.  Patient verbalized understanding, care notes given for new meds and pertinent education. Skin intact at discharge, IV discharged and intact. Patient escorted to car via wheelchair by NT.  Forbes Cellar, RN

## 2011-07-15 NOTE — Discharge Summary (Addendum)
Patient ID: [name] MRN: [id number] DOB/AGE: [birth year] 77 y.o.  Admit date: 07/08/2011 Discharge date: 07/15/2011  Primary Care Physician:  Herb Grays, MD, MD Pulmonologist: Sandrea Hughs  Discharge Diagnoses:    Present on Admission:  .Acute respiratory failure secondary to COPD  Exacerbation . emphysema .PVD .Carotid artery disease .HTN (hypertension)     Current Discharge Medication List    START taking these medications   Details  guaiFENesin (MUCINEX) 600 MG 12 hr tablet Take 2 tablets (1,200 mg total) by mouth 2 (two) times daily. Qty: 10 tablet, Refills: 0    moxifloxacin (AVELOX) 400 MG tablet Take 1 tablet (400 mg total) by mouth daily at 6 PM. Qty: 1 tablet, Refills: 0    nystatin (MYCOSTATIN) 100000 UNIT/ML suspension Take 5 mLs (500,000 Units total) by mouth 4 (four) times daily. Qty: 60 mL, Refills: 0    predniSONE (STERAPRED UNI-PAK) 10 MG tablet Take 1 tablet (10 mg total) by mouth daily. Take 50 mg ( 5 tablets) po daily for next 3 days, then 40 mg po daily for next 2 days, then 30 mg po daily for next 2 days, then 20 mg po daily for next 2 days, then 10 mg po daily for next 2 days then stop Qty: 35 tablet, Refills: 0      CONTINUE these medications which have NOT CHANGED   Details  albuterol (PROVENTIL) (2.5 MG/3ML) 0.083% nebulizer solution Take 2.5 mg by nebulization every 6 (six) hours as needed. For wheezing    Ascorbic Acid (VITAMIN C PO) Take 1 tablet by mouth daily.     aspirin 81 MG tablet Take 81 mg by mouth daily.      atorvastatin (LIPITOR) 10 MG tablet Take 10 mg by mouth daily.      budesonide-formoterol (SYMBICORT) 160-4.5 MCG/ACT inhaler Inhale 2 puffs into the lungs 2 (two) times daily.      calcium-vitamin D (OSCAL WITH D) 500-200 MG-UNIT per tablet Take 1 tablet by mouth 2 (two) times daily.      Coenzyme Q10 (COQ-10 PO) Take 1 tablet by mouth daily.     ipratropium (ATROVENT) 0.02 % nebulizer solution Take 2.5 mLs (500  mcg total) by nebulization 4 (four) times daily. Qty: 75 mL, Refills: 0    metoprolol tartrate (LOPRESSOR) 25 MG tablet Take 12.5 mg by mouth 2 (two) times daily. Take 1/2 tablet twice a day    Multiple Vitamin (MULTIVITAMIN) tablet Take 1 tablet by mouth daily.      Multiple Vitamins-Minerals (B COMPLEX PLUS VITAMIN C PO) Take 1 tablet by mouth daily.      oxybutynin (DITROPAN XL) 15 MG 24 hr tablet Take 15 mg by mouth as needed. For overactive bladder.         tiotropium (SPIRIVA) 18 MCG inhalation capsule Place 18 mcg into inhaler and inhale daily.      VITAMIN E PO Take 1 tablet by mouth daily.       STOP taking these medications     predniSONE (DELTASONE) 10 MG tablet         Disposition and Follow-up:  Follow up with PCP in 1 week  follow up with pulmonologist in 4-6 weeks  Consults:  none  Significant Diagnostic Studies:  Dg Chest Portable 1 View  07/08/2011  *RADIOLOGY REPORT*  Clinical Data: Short of breath.  Bronchitis.  COPD exacerbation.  PORTABLE CHEST - 1 VIEW  Comparison: 05/14/2010 and previous  Findings: Artifact overlies chest.  Heart size is normal.  Lungs are chronically hyperinflated.  There is a question of basilar infiltrate, particularly on the left.  Two-view chest radiography would be suggested when able.  No evidence of heart failure or effusion.  IMPRESSION: Chronic obstructive lung disease.  Question basilar infiltrate particularly on the left.  This is not definite.  Consider two-view chest radiography when able.  Original Report Authenticated By: Thomasenia Sales, M.D.    Brief H and P: For complete details please refer to admission H and P, but in brief 77 year old white female with past medical history of COPD with emphysema and hypertension who for the past 5-6 days of progressively worsening dyspnea on exertion, wheezing and a mild nonproductive cough. Her breathing was so rough that she could not take it anymore and came into the emergency room  today for further evaluation. Previous day she was evaluated 2 days ago and diagnosed with bronchitis. At that time she was given prescriptions for albuterol and Atrovent and prednisone. She did not fill the prednisone and her symptoms worsen that's when she came in.  In the emergency room she was noted to be in respiratory distress with audible wheezing and difficulty speaking and more than 2-3 words. Her oxygen saturations were low requiring 100% aerosol mask just keep her sats greater than 92%. She is also noted to be significantly tachycardic with a heart rate in the 140s. Patient received doses of magnesium sulfate, Solu-Medrol and multiple nebulizer treatments. She then started to ease off to the point where she was able to breathe more comfortably. after some point on the facemask she started to get tired and her oxygen saturations dropped and she was switched over to BiPAP.    Physical Exam on Discharge:  Filed Vitals:   07/14/11 1959 07/14/11 2107 07/15/11 0432 07/15/11 1358  BP:  132/79 145/87 132/81  Pulse:  115 68 88  Temp:  97.4 F (36.3 C) 98.1 F (36.7 C) 97.5 F (36.4 C)  TempSrc:  Oral Oral Oral  Resp:  22 21 20   Height:      Weight:      SpO2: 98% 98% 98% 96%     Intake/Output Summary (Last 24 hours) at 07/15/11 1446 Last data filed at 07/15/11 1300  Gross per 24 hour  Intake    702 ml  Output      0 ml  Net    702 ml    General: elderly female in no acute distress.  HEENT: no pallor, no icterus, moist oral mucosa, no JVD, no lymphadenopathy  Heart: Normal s1 &s2 Regular rate and rhythm, without murmurs, rubs, gallops.  Lungs: clear breath sounds b/l , no added sounds  Abdomen: Soft, nontender, nondistended, positive bowel sounds.  Extremities: No clubbing cyanosis or edema with positive pedal pulses.  Neuro: Alert, awake, oriented x3, nonfocal.  CBC:    Component Value Date/Time   WBC 11.5* 07/12/2011 0642   HGB 15.1* 07/12/2011 0642   HCT 44.3 07/12/2011 0642     PLT 363 07/12/2011 0642   MCV 92.9 07/12/2011 0642   NEUTROABS 4.7 06/15/2010 1621   LYMPHSABS 1.9 06/15/2010 1621   MONOABS 0.7 06/15/2010 1621   EOSABS 0.1 06/15/2010 1621   BASOSABS 0.2* 06/15/2010 1621    Basic Metabolic Panel:    Component Value Date/Time   NA 136 07/12/2011 0642   K 4.5 07/12/2011 0642   CL 98 07/12/2011 0642   CO2 29 07/12/2011 0642   BUN 18 07/12/2011 0642   CREATININE 0.60 07/15/2011  0505   GLUCOSE 130* 07/12/2011 0642   CALCIUM 9.1 07/12/2011 0642    Hospital Course:   1. Acute respiratory failure secondary to COPD exacerbation:  secondary to a COPD exacerbation. Patient was on BiPAP on admission and she improved. placed on 2 L of oxygen via nasal cannula . cotnnued on IV solumedrol and now trasntioned to po prednisone. Continued nebs. She is clinically stable and  will be discharged on po prednisone taper. Also given a course of po avelox. She follows with her pulmonologist Sandrea Hughs) and had PFTs done in 01/12 which showed severe obstructive disease.  Patient noted to drop her saturations to high 80s on RA and on ambulation and maintains sats on 2L via [place]. She has been arranged for home o2 which she is recommended to use continuously.  She already has  nebs and inhalers at home.  Also added 2 weeks of po nexium  For GI prophylaxis while on steroids   3. HTN:  Stable; continue home medications.   4. PVD:  Stable cont home meds   Patient clinically stable to be discharged home with outpatient follow up.  Time spent on Discharge: 45 minutes  Signed: Eddie North 07/15/2011, 2:46 PM

## 2011-07-15 NOTE — Plan of Care (Signed)
Problem: Phase II Progression Outcomes Goal: Dyspnea controlled w/progressive activity Outcome: Progressing Pt. Going home with O2  Problem: Discharge Progression Outcomes Goal: Dyspnea controlled Outcome: Progressing Need O2 for home

## 2012-04-16 ENCOUNTER — Emergency Department (HOSPITAL_COMMUNITY): Payer: Medicare Other

## 2012-04-16 ENCOUNTER — Inpatient Hospital Stay (HOSPITAL_COMMUNITY)
Admission: EM | Admit: 2012-04-16 | Discharge: 2012-04-20 | DRG: 189 | Disposition: A | Discharge status: Home Health | Payer: Medicare Other | Attending: Pulmonary Disease | Admitting: Pulmonary Disease

## 2012-04-16 ENCOUNTER — Encounter (HOSPITAL_COMMUNITY): Payer: Self-pay | Admitting: *Deleted

## 2012-04-16 DIAGNOSIS — J441 Chronic obstructive pulmonary disease with (acute) exacerbation: Secondary | ICD-10-CM

## 2012-04-16 DIAGNOSIS — Z79899 Other long term (current) drug therapy: Secondary | ICD-10-CM

## 2012-04-16 DIAGNOSIS — A422 Cervicofacial actinomycosis: Secondary | ICD-10-CM

## 2012-04-16 DIAGNOSIS — R918 Other nonspecific abnormal finding of lung field: Secondary | ICD-10-CM

## 2012-04-16 DIAGNOSIS — J438 Other emphysema: Secondary | ICD-10-CM

## 2012-04-16 DIAGNOSIS — J449 Chronic obstructive pulmonary disease, unspecified: Secondary | ICD-10-CM

## 2012-04-16 DIAGNOSIS — I739 Peripheral vascular disease, unspecified: Secondary | ICD-10-CM

## 2012-04-16 DIAGNOSIS — E785 Hyperlipidemia, unspecified: Secondary | ICD-10-CM | POA: Diagnosis present

## 2012-04-16 DIAGNOSIS — Z87891 Personal history of nicotine dependence: Secondary | ICD-10-CM

## 2012-04-16 DIAGNOSIS — I5189 Other ill-defined heart diseases: Secondary | ICD-10-CM | POA: Diagnosis present

## 2012-04-16 DIAGNOSIS — D72829 Elevated white blood cell count, unspecified: Secondary | ICD-10-CM | POA: Diagnosis present

## 2012-04-16 DIAGNOSIS — R0989 Other specified symptoms and signs involving the circulatory and respiratory systems: Secondary | ICD-10-CM

## 2012-04-16 DIAGNOSIS — R0902 Hypoxemia: Secondary | ICD-10-CM | POA: Diagnosis present

## 2012-04-16 DIAGNOSIS — J9690 Respiratory failure, unspecified, unspecified whether with hypoxia or hypercapnia: Secondary | ICD-10-CM

## 2012-04-16 DIAGNOSIS — J9600 Acute respiratory failure, unspecified whether with hypoxia or hypercapnia: Principal | ICD-10-CM

## 2012-04-16 DIAGNOSIS — B373 Candidiasis of vulva and vagina: Secondary | ICD-10-CM

## 2012-04-16 DIAGNOSIS — Z7982 Long term (current) use of aspirin: Secondary | ICD-10-CM

## 2012-04-16 DIAGNOSIS — I10 Essential (primary) hypertension: Secondary | ICD-10-CM | POA: Diagnosis present

## 2012-04-16 LAB — POCT I-STAT 3, ART BLOOD GAS (G3+)
Bicarbonate: 24.8 mEq/L — ABNORMAL HIGH (ref 20.0–24.0)
TCO2: 26 mmol/L (ref 0–100)
pCO2 arterial: 43.7 mmHg (ref 35.0–45.0)
pH, Arterial: 7.362 (ref 7.350–7.450)
pO2, Arterial: 99 mmHg (ref 80.0–100.0)

## 2012-04-16 LAB — CBC WITH DIFFERENTIAL/PLATELET
Basophils Absolute: 0 10*3/uL (ref 0.0–0.1)
Basophils Relative: 0 % (ref 0–1)
Hemoglobin: 15.3 g/dL — ABNORMAL HIGH (ref 12.0–15.0)
MCHC: 35.3 g/dL (ref 30.0–36.0)
Neutro Abs: 17.6 10*3/uL — ABNORMAL HIGH (ref 1.7–7.7)
Neutrophils Relative %: 81 % — ABNORMAL HIGH (ref 43–77)
Platelets: 300 10*3/uL (ref 150–400)
RDW: 12.6 % (ref 11.5–15.5)

## 2012-04-16 LAB — MRSA PCR SCREENING: MRSA by PCR: NEGATIVE

## 2012-04-16 LAB — BASIC METABOLIC PANEL
CO2: 25 mEq/L (ref 19–32)
Chloride: 94 mEq/L — ABNORMAL LOW (ref 96–112)
GFR calc Af Amer: >90 mL/min (ref >90)
Potassium: 3.6 mEq/L (ref 3.5–5.1)
Sodium: 134 mEq/L — ABNORMAL LOW (ref 135–145)

## 2012-04-16 LAB — TROPONIN I: Troponin I: 0.38 ng/mL (ref <0.30)

## 2012-04-16 MED ORDER — ADULT MULTIVITAMIN W/MINERALS CH
1.0000 | ORAL_TABLET | Freq: Every day | ORAL | Status: DC
  Administered 2012-04-16 – 2012-04-20 (×5): 1 via ORAL
  Filled 2012-04-16 (×5): qty 1

## 2012-04-16 MED ORDER — PANTOPRAZOLE SODIUM 40 MG IV SOLR
40.0000 mg | INTRAVENOUS | Status: DC
  Administered 2012-04-16: 40 mg via INTRAVENOUS
  Filled 2012-04-16 (×2): qty 40

## 2012-04-16 MED ORDER — METHYLPREDNISOLONE SODIUM SUCC 40 MG IJ SOLR
40.0000 mg | Freq: Three times a day (TID) | INTRAMUSCULAR | Status: DC
  Administered 2012-04-17 (×2): 40 mg via INTRAVENOUS
  Filled 2012-04-16 (×5): qty 1

## 2012-04-16 MED ORDER — ALBUTEROL SULFATE (5 MG/ML) 0.5% IN NEBU
2.5000 mg | INHALATION_SOLUTION | RESPIRATORY_TRACT | Status: DC | PRN
  Administered 2012-04-17: 2.5 mg via RESPIRATORY_TRACT
  Filled 2012-04-16: qty 0.5

## 2012-04-16 MED ORDER — HEPARIN SODIUM (PORCINE) 5000 UNIT/ML IJ SOLN
5000.0000 [IU] | Freq: Three times a day (TID) | INTRAMUSCULAR | Status: DC
  Administered 2012-04-16 – 2012-04-20 (×10): 5000 [IU] via SUBCUTANEOUS
  Filled 2012-04-16 (×14): qty 1

## 2012-04-16 MED ORDER — ALBUTEROL (5 MG/ML) CONTINUOUS INHALATION SOLN
INHALATION_SOLUTION | RESPIRATORY_TRACT | Status: AC
  Filled 2012-04-16: qty 20

## 2012-04-16 MED ORDER — ATORVASTATIN CALCIUM 10 MG PO TABS
10.0000 mg | ORAL_TABLET | Freq: Every day | ORAL | Status: DC
Start: 2012-04-16 — End: 2012-04-20
  Administered 2012-04-16 – 2012-04-20 (×5): 10 mg via ORAL
  Filled 2012-04-16 (×5): qty 1

## 2012-04-16 MED ORDER — METHYLPREDNISOLONE SODIUM SUCC 40 MG IJ SOLR
40.0000 mg | Freq: Three times a day (TID) | INTRAMUSCULAR | Status: DC
  Filled 2012-04-16 (×2): qty 1

## 2012-04-16 MED ORDER — IPRATROPIUM BROMIDE 0.02 % IN SOLN
0.5000 mg | Freq: Four times a day (QID) | RESPIRATORY_TRACT | Status: DC
  Administered 2012-04-16 – 2012-04-20 (×15): 0.5 mg via RESPIRATORY_TRACT
  Filled 2012-04-16 (×15): qty 2.5

## 2012-04-16 MED ORDER — ASPIRIN 81 MG PO CHEW
81.0000 mg | CHEWABLE_TABLET | Freq: Every day | ORAL | Status: DC
  Administered 2012-04-16 – 2012-04-20 (×5): 81 mg via ORAL
  Filled 2012-04-16 (×5): qty 1

## 2012-04-16 MED ORDER — ALBUTEROL SULFATE (5 MG/ML) 0.5% IN NEBU
2.5000 mg | INHALATION_SOLUTION | Freq: Four times a day (QID) | RESPIRATORY_TRACT | Status: DC
  Administered 2012-04-16 – 2012-04-17 (×4): 2.5 mg via RESPIRATORY_TRACT
  Filled 2012-04-16 (×4): qty 0.5

## 2012-04-16 MED ORDER — METHYLPREDNISOLONE SODIUM SUCC 125 MG IJ SOLR
125.0000 mg | Freq: Once | INTRAMUSCULAR | Status: AC
  Administered 2012-04-16: 125 mg via INTRAVENOUS
  Filled 2012-04-16 (×2): qty 2

## 2012-04-16 MED ORDER — LORAZEPAM 2 MG/ML IJ SOLN
0.5000 mg | Freq: Once | INTRAMUSCULAR | Status: AC
  Administered 2012-04-16: 0.5 mg via INTRAVENOUS
  Filled 2012-04-16: qty 1

## 2012-04-16 MED ORDER — ALBUTEROL (5 MG/ML) CONTINUOUS INHALATION SOLN
INHALATION_SOLUTION | RESPIRATORY_TRACT | Status: AC
  Filled 2012-04-16: qty 40

## 2012-04-16 MED ORDER — VITAMIN D3 25 MCG (1000 UNIT) PO TABS
1000.0000 [IU] | ORAL_TABLET | Freq: Every day | ORAL | Status: DC
  Administered 2012-04-16 – 2012-04-20 (×5): 1000 [IU] via ORAL
  Filled 2012-04-16 (×5): qty 1

## 2012-04-16 MED ORDER — HEPARIN SODIUM (PORCINE) 5000 UNIT/ML IJ SOLN
5000.0000 [IU] | Freq: Three times a day (TID) | INTRAMUSCULAR | Status: DC
  Filled 2012-04-16 (×2): qty 1

## 2012-04-16 MED ORDER — METOPROLOL TARTRATE 12.5 MG HALF TABLET
12.5000 mg | ORAL_TABLET | Freq: Two times a day (BID) | ORAL | Status: DC
  Administered 2012-04-16 – 2012-04-19 (×7): 12.5 mg via ORAL
  Filled 2012-04-16 (×7): qty 1

## 2012-04-16 NOTE — ED Notes (Signed)
bipap mask readjusted, pt more comfortable,.

## 2012-04-16 NOTE — ED Notes (Signed)
To ED via [facility] for eval of severe resp distress. 10mg  Albuterol/.5 Atrovent and 125mg  Solumedrol IM given at MDs office. Unable speak phrases

## 2012-04-16 NOTE — Progress Notes (Signed)
CRITICAL VALUE ALERT  Critical value received:  Trop 0.38  Date of notification:  04/16/12  Time of notification:  1936  Critical value read back:yes  Nurse who received alert:  Adria Dill RN  MD notified (1st page):  DR Craige Cotta  Time of first page:  1937  MD notified (2nd page):  Time of second page:  Responding MD:  Dr Craige Cotta  Time MD responded:  [phone]

## 2012-04-16 NOTE — ED Provider Notes (Signed)
History     CSN: [id number]  Arrival date & time 04/16/12  1454   First MD Initiated Contact with Patient 04/16/12 1508      Chief Complaint  Patient presents with  . Shortness of Breath     Patient is a 77 y.o. female presenting with shortness of breath. The history is provided by the patient.  Shortness of Breath  The current episode started today. The onset was sudden. The problem occurs continuously. The problem has been rapidly worsening. The problem is severe. The symptoms are relieved by beta-agonist inhalers. The symptoms are aggravated by activity. Associated symptoms include cough, shortness of breath and wheezing. Pertinent negatives include no chest pain.   Pt presents to the ED for shortness of breath, likely COPD exacerbation.  She reports it worsened today and EMS was called.   Past Medical History  Diagnosis Date  . PVD (peripheral vascular disease)   . Actinomycosis, cervicofacial   . Emphysema   . COPD (chronic obstructive pulmonary disease)   . Candidiasis, vagina   . HTN (hypertension)     Past Surgical History  Procedure Date  . Appendectomy   . Tonsillectomy     Family History  Problem Relation Age of Onset  . Coronary artery disease      family hx of 1st. degree relative <60  . Coronary artery disease      family hx of female 1st. degree relative <50  . Emphysema Father   . Emphysema      3 sisters. they were smokers  . Stomach cancer Sister     History  Substance Use Topics  . Smoking status: Former Smoker    Types: Cigarettes    Quit date: 07/11/2005  . Smokeless tobacco: Not on file   Comment: smoked for 48 years 1 ppd  . Alcohol Use: No    OB History    Grav Para Term Preterm Abortions TAB SAB Ect Mult Living                  Review of Systems  Unable to perform ROS: Unstable vital signs  Respiratory: Positive for cough, shortness of breath and wheezing.   Cardiovascular: Negative for chest pain.    Allergies  Penicillins  and Pneumovax  Home Medications   Current Outpatient Rx  Name Route Sig Dispense Refill  . ALBUTEROL SULFATE (2.5 MG/3ML) 0.083% IN NEBU Nebulization Take 2.5 mg by nebulization every 6 (six) hours as needed. For wheezing    . VITAMIN C PO Oral Take 1 tablet by mouth daily.     . ASPIRIN 81 MG PO TABS Oral Take 81 mg by mouth daily.      . ATORVASTATIN CALCIUM 10 MG PO TABS Oral Take 10 mg by mouth daily.      . BUDESONIDE-FORMOTEROL FUMARATE 160-4.5 MCG/ACT IN AERO Inhalation Inhale 2 puffs into the lungs 2 (two) times daily.      Marland Kitchen CALCIUM CARBONATE-VITAMIN D 500-200 MG-UNIT PO TABS Oral Take 1 tablet by mouth 2 (two) times daily.      . COQ-10 PO Oral Take 1 tablet by mouth daily.     Marland Kitchen ESOMEPRAZOLE MAGNESIUM 40 MG PO CPDR Oral Take 1 capsule (40 mg total) by mouth daily before breakfast. 15 capsule 0  . GUAIFENESIN ER 600 MG PO TB12 Oral Take 2 tablets (1,200 mg total) by mouth 2 (two) times daily. 10 tablet 0  . IPRATROPIUM BROMIDE 0.02 % IN SOLN Nebulization Take 2.5 mLs (  500 mcg total) by nebulization 4 (four) times daily. 75 mL 0  . METOPROLOL TARTRATE 25 MG PO TABS Oral Take 12.5 mg by mouth 2 (two) times daily. Take 1/2 tablet twice a day    . ONE-DAILY MULTI VITAMINS PO TABS Oral Take 1 tablet by mouth daily.      . B COMPLEX PLUS VITAMIN C PO Oral Take 1 tablet by mouth daily.      . OXYBUTYNIN CHLORIDE ER 15 MG PO TB24 Oral Take 15 mg by mouth as needed. For overactive bladder.    Marland Kitchen PREDNISONE (PAK) 10 MG PO TABS Oral Take 1 tablet (10 mg total) by mouth daily. Take 50 mg ( 5 tablets) po daily for next 3 days, then 40 mg po daily for next 2 days, then 30 mg po daily for next 2 days, then 20 mg po daily for next 2 days, then 10 mg po daily for next 2 days then stop 35 tablet 0  . TIOTROPIUM BROMIDE MONOHYDRATE 18 MCG IN CAPS Inhalation Place 18 mcg into inhaler and inhale daily.      Marland Kitchen VITAMIN E PO Oral Take 1 tablet by mouth daily.       BP 214/110  Pulse 138  Temp 98.1 F  (36.7 C) (Oral)  Resp 31  SpO2 100%  Physical Exam CONSTITUTIONAL: respiratory distress noted HEAD AND FACE: Normocephalic/atraumatic EYES: EOMI/PERRL ENMT: nebs mask in place NECK: supple no meningeal signs SPINE:entire spine nontender CV: tachycardic no murmurs/rubs/gallops noted LUNGS: tachypneic, bilateral wheeze with poor air entry, able to speak in short sentences only ABDOMEN: soft, nontender, no rebound or guarding GU:no cva tenderness NEURO: Pt is awake/alert, moves all extremitiesx4 EXTREMITIES: pulses normal, full ROM, no edema SKIN: warm, color normal PSYCH:anxious  ED Course  Procedures   CRITICAL CARE Performed by: Joya Gaskins   Total critical care time: 40  Critical care time was exclusive of separately billable procedures and treating other patients.  Critical care was necessary to treat or prevent imminent or life-threatening deterioration.  Critical care was time spent personally by me on the following activities: development of treatment plan with patient and/or surrogate as well as nursing, discussions with consultants, evaluation of patient's response to treatment, examination of patient, obtaining history from patient or surrogate, ordering and performing treatments and interventions, ordering and review of laboratory studies, ordering and review of radiographic studies, pulse oximetry and re-evaluation of patient's condition.    Labs Reviewed  CBC WITH DIFFERENTIAL  BASIC METABOLIC PANEL   9:60 PM Pt seen/examined, likely COPD exacerbation, she is currently mentating, suspect she will respond to bipap Will follow closely  3:45 PM Pt improved work of breathing with bipap Awaiting labs/imaging  5:06 PM Pt improved after taking bipap off Still tachycardic, but not hypotensive She is able to speak and reports feeling improved She has been seen by pulmonary previously, d/w dr Craige Cotta with critical care will eval patient Likely stepdown unit  for admission Pt stabilized in the ED  MDM  Nursing notes including past medical history and social history reviewed and considered in documentation Labs/vital reviewed and considered xrays reviewed and considered        Date: 04/16/2012  Rate: 137  Rhythm: sinus tachycardia  QRS Axis: normal  Intervals: normal  ST/T Wave abnormalities: nonspecific ST changes  Conduction Disutrbances:none  Narrative Interpretation:   Old EKG Reviewed: unchanged    Joya Gaskins, MD 04/16/12 1707

## 2012-04-16 NOTE — ED Notes (Signed)
Taken off bipap and placed on venturi mask at 40% FIO2 at 12 L.Marland Kitchen

## 2012-04-16 NOTE — H&P (Signed)
Name: [name] MRN: [id number] DOB: [birth year]    LOS: 0 PCP: Dewain Penning. Cardiology: Juanito Doom. [facility] / Critical Care Note   History of Present Illness: 77 y/o WF, former smoker, with PMH of HTN, PVD, HLD, COPD presented to PCP office on 10/7 with 3 day history of progressive shortness of breath.  Was noted to be in significant distress and transferred to [facility] via EMS.  In ER, was placed on BiPap, rx'd with beta agonists with improvement in symptoms.  PCCM called for admit.  Patient denies cough, sputum production, fevers, chills, chest pain.     Cultures: 10/7 Sputum>>>  Antibiotics: Avelox (AECOPD) 10/7>>>  Tests / Events:    Past Medical History  Diagnosis Date  . PVD (peripheral vascular disease)   . Actinomycosis, cervicofacial   . Emphysema   . COPD (chronic obstructive pulmonary disease)   . Candidiasis, vagina   . HTN (hypertension)     Past Surgical History  Procedure Date  . Appendectomy   . Tonsillectomy     Prior to Admission medications   Medication Sig Start Date End Date Taking? Authorizing Provider  albuterol (PROVENTIL HFA;VENTOLIN HFA) 108 (90 BASE) MCG/ACT inhaler Inhale 2 puffs into the lungs every 6 (six) hours as needed. For shortness of breath   Yes Historical Provider, MD  aspirin 81 MG tablet Take 81 mg by mouth daily.     Yes Historical Provider, MD  atorvastatin (LIPITOR) 10 MG tablet Take 10 mg by mouth daily.     Yes Historical Provider, MD  budesonide-formoterol (SYMBICORT) 160-4.5 MCG/ACT inhaler Inhale 2 puffs into the lungs 2 (two) times daily.     Yes Historical Provider, MD  calcium-vitamin D (OSCAL WITH D) 500-200 MG-UNIT per tablet Take 1 tablet by mouth 2 (two) times daily.     Yes Historical Provider, MD  Cholecalciferol (VITAMIN D PO) Take 1 tablet by mouth daily.   Yes Historical Provider, MD  metoprolol tartrate (LOPRESSOR) 25 MG tablet Take 12.5 mg by mouth 2 (two) times daily.    Yes Historical Provider, MD    Multiple Vitamin (MULTIVITAMIN) tablet Take 1 tablet by mouth daily.     Yes Historical Provider, MD  tiotropium (SPIRIVA) 18 MCG inhalation capsule Place 18 mcg into inhaler and inhale daily.     Yes Historical Provider, MD  VITAMIN E PO Take 1 tablet by mouth daily.    Yes Historical Provider, MD  predniSONE (STERAPRED UNI-PAK) 10 MG tablet Take 1 tablet (10 mg total) by mouth daily. Take 50 mg ( 5 tablets) po daily for next 3 days, then 40 mg po daily for next 2 days, then 30 mg po daily for next 2 days, then 20 mg po daily for next 2 days, then 10 mg po daily for next 2 days then stop 07/15/11 07/25/11  Theda Belfast Dhungel, MD    Allergies Allergies  Allergen Reactions  . Penicillins Hives    Has taken it since.  . Pneumovax (Pneumococcal Polysaccharides) Hives    Family History Family History  Problem Relation Age of Onset  . Coronary artery disease      family hx of 1st. degree relative <60  . Coronary artery disease      family hx of female 1st. degree relative <50  . Emphysema Father   . Emphysema      3 sisters. they were smokers  . Stomach cancer Sister     Social History  reports that she quit smoking about  6 years ago. Her smoking use included Cigarettes. She does not have any smokeless tobacco history on file. She reports that she does not drink alcohol or use illicit drugs.  Review Of Systems:  SEE HPI.  All other systems reviewed and are negative.   Vital Signs: Temp:  [98.1 F (36.7 C)] 98.1 F (36.7 C) (10/07 1458) Pulse Rate:  [129-143] 129  (10/07 1730) Resp:  [22-36] 33  (10/07 1730) BP: (91-214)/(52-110) 121/69 mmHg (10/07 1730) SpO2:  [97 %-100 %] 100 % (10/07 1730) FiO2 (%):  [40 %] 40 % (10/07 1704)    Physical Examination: General: wdwn adult female  Neuro: AAOx4, speech clear, MAE CV: s1s2 rrr, no m/r/g PULM: resp's mildly labored, able to speak in full sentences, 40% VM GI: abd round/soft, bsx4 active, non-tender Extremities: warm/dry, no  edema   Ventilator settings: Vent Mode:  [-]  FiO2 (%):  [40 %] 40 %  Labs    CBC  Lab 04/16/12 1523  HGB 15.3*  HCT 43.3  WBC 21.9*  PLT 300   BMET  Lab 04/16/12 1523  NA 134*  K 3.6  CL 94*  CO2 25  GLUCOSE 202*  BUN 9  CREATININE 0.55  CALCIUM 10.2  MG --  PHOS --    Lab 04/16/12 1655  PHART 7.362  PCO2ART 43.7  PO2ART 99.0  HCO3 24.8*  TCO2 26  O2SAT 97.0     Radiology: 10/7 CXR>>>no active disease / acute infiltrate   Assessment and Plan: Active Problems:  * No active hospital problems. *   Acute Exacerbation of COPD Leukocytosis Assessment: Former smoker (quit 2007, 30+ years 1ppd).  Last hospitalized in 07/2011.  Never intubated.  3 days progressive SOB, no sputum production.  On symbicort, spiriva, albuterol at baseline.  No evidence of PNA.   Plan: -NPO until respiratory status stabilizes -IV steroids -hold home regimen -sputum culture -scheduled bronchodilators & PRN -no further CXR unless change in sputum / clinical symptoms -BiPap PRN for respiratory support -Avelox for AECOPD  Hypertension HLD Assessment: hx of HTN on lopressor Plan: -continue home lopressor -monitor on SDU -continue home cholesterol    Best practices / Disposition: -->Code Status: Full Code -->DVT Px: Heparin -->GI Px: protonix -->Diet: NPO x meds    Canary Brim, NP-C [facility] Pgr: [phone]  04/16/2012, 5:45 PM  Patient seen and examined, agree with above note.  I dictated the care and orders written for this patient under my direction.  Koren Bound, M.D. [phone]

## 2012-04-17 LAB — CBC
Hemoglobin: 13.6 g/dL (ref 12.0–15.0)
Platelets: 275 10*3/uL (ref 150–400)
RBC: 4.25 MIL/uL (ref 3.87–5.11)
WBC: 13.4 10*3/uL — ABNORMAL HIGH (ref 4.0–10.5)

## 2012-04-17 LAB — COMPREHENSIVE METABOLIC PANEL
BUN: 11 mg/dL (ref 6–23)
CO2: 28 mEq/L (ref 19–32)
Calcium: 9.7 mg/dL (ref 8.4–10.5)
Chloride: 97 mEq/L (ref 96–112)
Creatinine, Ser: 0.57 mg/dL (ref 0.50–1.10)
GFR calc Af Amer: >90 mL/min (ref >90)
GFR calc non Af Amer: 89 mL/min — ABNORMAL LOW (ref >90)
Total Bilirubin: 0.3 mg/dL (ref 0.3–1.2)

## 2012-04-17 LAB — TROPONIN I
Troponin I: 0.3 ng/mL (ref <0.30)
Troponin I: <0.3 ng/mL (ref <0.30)

## 2012-04-17 MED ORDER — LEVALBUTEROL HCL 0.63 MG/3ML IN NEBU
0.6300 mg | INHALATION_SOLUTION | RESPIRATORY_TRACT | Status: DC | PRN
  Filled 2012-04-17: qty 3

## 2012-04-17 MED ORDER — DOXYCYCLINE HYCLATE 100 MG PO TABS
100.0000 mg | ORAL_TABLET | Freq: Two times a day (BID) | ORAL | Status: DC
  Administered 2012-04-17 – 2012-04-20 (×7): 100 mg via ORAL
  Filled 2012-04-17 (×8): qty 1

## 2012-04-17 MED ORDER — METOPROLOL TARTRATE 1 MG/ML IV SOLN: 2.5000 mg | INTRAVENOUS | Status: DC | PRN

## 2012-04-17 MED ORDER — PANTOPRAZOLE SODIUM 40 MG PO TBEC: 40.0000 mg | DELAYED_RELEASE_TABLET | Freq: Every day | ORAL | Status: DC

## 2012-04-17 MED ORDER — LEVALBUTEROL HCL 0.63 MG/3ML IN NEBU
0.6300 mg | INHALATION_SOLUTION | Freq: Four times a day (QID) | RESPIRATORY_TRACT | Status: DC
  Administered 2012-04-17 – 2012-04-20 (×11): 0.63 mg via RESPIRATORY_TRACT
  Filled 2012-04-17 (×15): qty 3

## 2012-04-17 MED ORDER — METHYLPREDNISOLONE SODIUM SUCC 40 MG IJ SOLR
40.0000 mg | Freq: Two times a day (BID) | INTRAMUSCULAR | Status: DC
  Administered 2012-04-18: 40 mg via INTRAVENOUS
  Filled 2012-04-17 (×3): qty 1

## 2012-04-17 NOTE — Progress Notes (Signed)
Pt called out c/o SOB; sats 96% on 2L [place] at this time; RT called to make aware; will provide neb treatment.

## 2012-04-17 NOTE — Evaluation (Signed)
Occupational Therapy Evaluation Patient Details Name: [name] MRN: [id number] DOB: 03-Mar-1935 Today's Date: 04/17/2012 Time: 6213-0865 OT Time Calculation (min): 21 min  OT Assessment / Plan / Recommendation Clinical Impression  This 77 y.o. female admitted with progressive SOB and h/o COPD.  Pt. very dyspneic with minimal activity. Pt. will benefit from acute OT to maximize safety and independence with BADLs to allow pt. to return home with husband with supervision to min A.      OT Assessment  Patient needs continued OT Services    Follow Up Recommendations  LTACH;Home health OT (Depending on progress and resp status)    Barriers to Discharge None    Equipment Recommendations  None recommended by PT    Recommendations for Other Services    Frequency  Min 2X/week    Precautions / Restrictions Precautions Precautions: Fall Restrictions Weight Bearing Restrictions: No       ADL  Eating/Feeding: Performed;Independent Where Assessed - Eating/Feeding: Edge of bed Grooming: Simulated;Wash/dry hands;Wash/dry face;Min guard Where Assessed - Grooming: Supported standing Upper Body Bathing: Simulated;Minimal assistance Where Assessed - Upper Body Bathing: Unsupported sitting Lower Body Bathing: Simulated;Moderate assistance Where Assessed - Lower Body Bathing: Unsupported sit to stand Upper Body Dressing: Simulated;Minimal assistance Where Assessed - Upper Body Dressing: Unsupported sitting Lower Body Dressing: Simulated;Moderate assistance Where Assessed - Lower Body Dressing: Unsupported sit to stand Toilet Transfer: Simulated;Min guard Toilet Transfer Method: Sit to Barista: Comfort height toilet Toileting - Clothing Manipulation and Hygiene: Simulated;Min guard Where Assessed - Toileting Clothing Manipulation and Hygiene: Standing Transfers/Ambulation Related to ADLs: Pt. ambulates with min guard assist ADL Comments: Pt. requires assist for  ADLs due to dyspnea/fatigue.  Dyspnea 4/4 with minimal activity.  Pt. required 5 rest breaks during eval    OT Diagnosis: Generalized weakness  OT Problem List: Decreased strength;Decreased activity tolerance;Decreased knowledge of use of DME or AE;Cardiopulmonary status limiting activity OT Treatment Interventions: Self-care/ADL training;Therapeutic exercise;DME and/or AE instruction;Therapeutic activities;Patient/family education   OT Goals Acute Rehab OT Goals OT Goal Formulation: With patient Time For Goal Achievement: 05/01/12 Potential to Achieve Goals: Good ADL Goals Pt Will Perform Grooming: with modified independence;Standing at sink ADL Goal: Grooming - Progress: Goal set today Pt Will Perform Upper Body Bathing: with set-up;Sitting, edge of bed ADL Goal: Upper Body Bathing - Progress: Goal set today Pt Will Perform Lower Body Bathing: with set-up;Sit to stand from bed;Sit to stand from chair ADL Goal: Lower Body Bathing - Progress: Goal set today Pt Will Perform Lower Body Dressing: with modified independence;Sit to stand from chair;Sit to stand from bed ADL Goal: Lower Body Dressing - Progress: Goal set today Pt Will Transfer to Toilet: with modified independence;Ambulation;Comfort height toilet ADL Goal: Toilet Transfer - Progress: Goal set today Pt Will Perform Toileting - Clothing Manipulation: with modified independence;Standing ADL Goal: Toileting - Clothing Manipulation - Progress: Goal set today Additional ADL Goal #1: Pt. will participate in 20 min ADL with no more than 3 rest breaks ADL Goal: Additional Goal #1 - Progress: Goal set today Additional ADL Goal #2: Pt. will incorporate energy conservation techniques into ADL activities independently ADL Goal: Additional Goal #2 - Progress: Goal set today  Visit Information  Assistance Needed: +1    Subjective Data  Subjective: It's just hard to talk and breathe" Patient Stated Goal: To get better   Prior  Functioning     Home Living Lives With: Spouse Available Help at Discharge: Family;Available 24 hours/day Type of Home: House  Home Access: Stairs to enter Entrance Stairs-Number of Steps: 3 Entrance Stairs-Rails: Left Home Layout: One level Bathroom Shower/Tub: Health visitor: Handicapped height Bathroom Accessibility: Yes How Accessible: Accessible via walker Home Adaptive Equipment: Straight cane;Walker - Proofreader;Wheelchair - powered (Home O2 - 2L PRN) Additional Comments: Has not needed to use DME Prior Function Level of Independence: Needs assistance Needs Assistance: Meal Prep;Light Housekeeping Meal Prep: Moderate Light Housekeeping: Moderate Able to Take Stairs?: Yes Driving: Yes Communication Communication: No difficulties         Vision/Perception     Cognition  Overall Cognitive Status: Appears within functional limits for tasks assessed/performed Arousal/Alertness: Awake/alert Orientation Level: Appears intact for tasks assessed Behavior During Session: [facility] for tasks performed    Extremity/Trunk Assessment Right Upper Extremity Assessment RUE ROM/Strength/Tone: Within functional levels RUE Coordination: WFL - gross/fine motor Left Upper Extremity Assessment LUE ROM/Strength/Tone: Within functional levels LUE Coordination: WFL - gross/fine motor Right Lower Extremity Assessment RLE ROM/Strength/Tone: WFL for tasks assessed Left Lower Extremity Assessment LLE ROM/Strength/Tone: WFL for tasks assessed Trunk Assessment Trunk Assessment: Normal     Mobility Bed Mobility Bed Mobility: Sit to Supine Sit to Supine: 5: Supervision;HOB elevated Details for Bed Mobility Assistance: Pt requires HOB elevated due to dyspnea Transfers Transfers: Sit to Stand;Stand to Sit Sit to Stand: 5: Supervision;With upper extremity assist;From bed Stand to Sit: 5: Supervision;With upper extremity assist;To bed Details for Transfer  Assistance: Pt moves well.  DOE 4/4.  Has to take frequent rest breaks due to feeling she can't continue.     Shoulder Instructions     Exercise     Balance     End of Session OT - End of Session Activity Tolerance: Patient limited by fatigue Patient left: in bed;with call bell/phone within reach Nurse Communication: Mobility status  GO     Antionne Enrique, Ursula Alert M 04/17/2012, 2:35 PM

## 2012-04-17 NOTE — Progress Notes (Signed)
Inpatient Diabetes Program Recommendations  AACE/ADA: New Consensus Statement on Inpatient Glycemic Control (2013)  Target Ranges:  Prepandial:   less than 140 mg/dL      Peak postprandial:   less than 180 mg/dL (1-2 hours)      Critically ill patients:  140 - 180 mg/dL   Results for [name], [name] (MRN [id number]) as of 04/17/2012 11:23  Ref. Range 04/16/2012 15:23  Glucose Latest Range: 70-99 mg/dL 409 (H)    Inpatient Diabetes Program Recommendations Correction (SSI): Please consider checking CBGs and covering with Novolog Sensitive correction scale (SSI) tid ac + HS if elevated while on IV steroids.  Note: Will follow. Ambrose Finland RN, MSN, CDE Diabetes Coordinator Inpatient Diabetes Program [phone]

## 2012-04-17 NOTE — Evaluation (Signed)
Physical Therapy Evaluation Patient Details Name: [name] MRN: [id number] DOB: Feb 14, 1935 Today's Date: 04/17/2012 Time: 1191-4782 PT Time Calculation (min): 18 min  PT Assessment / Plan / Recommendation Clinical Impression  Patient s/p SOB with decr mobility secondary to poor endurance for activity.  Will benefit from PT to address endurance issues.  Patient may benefit from [facility] stay given her limited respiratory status.      PT Assessment  Patient needs continued PT services    Follow Up Recommendations  Post acute inpatient;Supervision/Assistance - 24 hour    Does the patient have the potential to tolerate intense rehabilitation   No, Recommend LTACH  Barriers to Discharge        Equipment Recommendations  None recommended by PT    Recommendations for Other Services     Frequency Min 3X/week    Precautions / Restrictions Precautions Precautions: Fall Restrictions Weight Bearing Restrictions: No   Pertinent Vitals/Pain VSS but DOE 4/4, No pain      Mobility  Bed Mobility Bed Mobility: Sit to Supine Sit to Supine: 5: Supervision;HOB elevated Transfers Transfers: Sit to Stand;Stand to Sit Sit to Stand: 5: Supervision;With upper extremity assist;From bed Stand to Sit: 5: Supervision;With upper extremity assist;To bed Details for Transfer Assistance: Pt moves well.  DOE 4/4.  Has to take frequent rest breaks due to feeling she can't continue. Ambulation/Gait Ambulation/Gait Assistance: 4: Min guard Ambulation Distance (Feet): 30 Feet Assistive device: None Ambulation/Gait Assistance Details: Pt. ambulated a short distance with good balance reactions overall.  Pt. without any significant LOB.  Pt. limited by DOE 4/4.  Does not desat but labored breathing at rest.  Pt. practically collapsed on the bed after just the short distance ambulated.   Gait Pattern: Decreased stride length;Step-to pattern;Shuffle;Trunk flexed;Narrow base of support Gait velocity:  decreased Stairs: No Wheelchair Mobility Wheelchair Mobility: No              PT Diagnosis: Generalized weakness  PT Problem List: Decreased activity tolerance;Decreased mobility PT Treatment Interventions: DME instruction;Gait training;Functional mobility training;Therapeutic activities;Therapeutic exercise;Balance training;Stair training;Patient/family education   PT Goals Acute Rehab PT Goals PT Goal Formulation: With patient Time For Goal Achievement: 05/01/12 Potential to Achieve Goals: Good Pt will go Sit to Stand: Independently PT Goal: Sit to Stand - Progress: Goal set today Pt will Ambulate: 51 - 150 feet;with modified independence;with least restrictive assistive device (with DOE 2/4.) PT Goal: Ambulate - Progress: Goal set today Pt will Go Up / Down Stairs: 3-5 stairs;with supervision;with least restrictive assistive device PT Goal: Up/Down Stairs - Progress: Goal set today  Visit Information  Last PT Received On: 04/17/12 Assistance Needed: +1 PT/OT Co-Evaluation/Treatment: Yes    Subjective Data  Subjective: "I can't breathe." Patient Stated Goal: To go home when feeling better   Prior Functioning  Home Living Lives With: Spouse Available Help at Discharge: Family;Available 24 hours/day Type of Home: House Home Access: Stairs to enter [employer] of Steps: 3 Entrance Stairs-Rails: Left Home Layout: One level Bathroom Shower/Tub: Health visitor: Handicapped height Bathroom Accessibility: Yes How Accessible: Accessible via walker Home Adaptive Equipment: Straight cane;Walker - Proofreader;Wheelchair - powered Additional Comments: Has not needed to use DME Prior Function Level of Independence: Needs assistance Needs Assistance: Meal Prep;Light Housekeeping Meal Prep: Moderate Light Housekeeping: Moderate Able to Take Stairs?: Yes Driving: Yes Communication Communication: No difficulties    Cognition  Overall  Cognitive Status: Appears within functional limits for tasks assessed/performed Arousal/Alertness: Awake/alert Orientation Level: Appears  intact for tasks assessed Behavior During Session: [facility] for tasks performed    Extremity/Trunk Assessment Right Lower Extremity Assessment RLE ROM/Strength/Tone: [facility] for tasks assessed Left Lower Extremity Assessment LLE ROM/Strength/Tone: [facility] for tasks assessed   Balance    End of Session PT - End of Session Equipment Utilized During Treatment: Gait belt;Oxygen Activity Tolerance: Patient limited by fatigue Patient left: in bed;with call bell/phone within reach Nurse Communication: Mobility status       INGOLD,Clinton Dragone 04/17/2012, 2:07 PM  [facility] Acute Rehabilitation [phone] (pager)

## 2012-04-17 NOTE — Progress Notes (Signed)
SUBJ:  No new complaints. Remains dyspneic with minimal activity. No rest dyspnea   OBJ: Filed Vitals:   04/17/12 0203 04/17/12 0331 04/17/12 0800 04/17/12 0827  BP:  111/67 152/71   Pulse: 99 86 97   Temp:  97.8 F (36.6 C) 97.5 F (36.4 C)   TempSrc:  Oral Oral   Resp: 23 19 25    Height:      Weight:      SpO2: 99% 98% 98% 98%   NAD Mild pursed lip breathing BS diminished without wheezing RRR S M NABS No edema   No new CXR   Principal Problem:  *COPD with exacerbation Active Problems:  HTN (hypertension)  Acute respiratory failure  Hyperlipidemia  Hypoxemia  DISC: Her illness onset was with "cold" symptoms and her husband has recently been diagnosed with bronchitis Not requiring NPPV since admission   PLAN: Taper steroids Cont BDs Begin doxycycline X 7 days Rest of medical regimen reviewed and will remain as is Transfer to [employer] Advance activity She will be ready for discharge as soon as she feels that she can do the necessary ADLs  Billy Fischer, MD ; [facility] service Mobile [phone].  After 5:30 PM or weekends, call [phone]

## 2012-04-17 NOTE — Progress Notes (Signed)
MD returned page; no orders for HR meds; will d/c albuterol nebs and order xopenex nebs PRN instead; will cont. To monitor.

## 2012-04-17 NOTE — Progress Notes (Signed)
HR 120's ST; no PRN meds for HR; MD paged at this time; will await callback.

## 2012-04-17 NOTE — Progress Notes (Signed)
Utilization Review Completed.Pavan Bring T10/02/2012   

## 2012-04-17 NOTE — Plan of Care (Signed)
Problem: Food- and Nutrition-Related Knowledge Deficit (NB-1.1) Goal: Nutrition education Formal process to instruct or train a patient/client in a skill or to impart knowledge to help patients/clients voluntarily manage or modify food choices and eating behavior to maintain or improve health.  Outcome: Completed/Met Date Met:  04/17/12  RD consulted for nutrition assessment for COPD Gold Protocol.  RD provided "COPD Nutrition Therapy" handout from the Academy of Nutrition and Dietetics. Discussed importance of calories and protein intake with COPD. Discussed importance of balanced meals and small, frequent meals to help with weight maintenance.  Expect good compliance.  Body mass index is 23.79 kg/(m^2). Pt meets criteria for Normal Weight based on current BMI.  Current diet order is Heart Healthy, patient is consuming approximately 50% of meals at this time. Labs and medications reviewed. No further nutrition interventions warranted at this time. RD contact information provided. If additional nutrition issues arise, please re-consult RD.  Jarold Motto MS, RD, LDN Pager: [phone] After-hours pager: [phone]

## 2012-04-17 NOTE — Significant Event (Signed)
Pt developing sinus tachycardia while getting albuterol neb tx.  Will change to xopenex and monitor.  Coralyn Helling, MD 04/17/2012, 7:01 PM [phone]

## 2012-04-17 NOTE — Progress Notes (Signed)
Clinical Social Work  CSW received inappropriate referral for "gold protocol". CSW is signing off but available if needed.  [place], [place] [phone]

## 2012-04-17 NOTE — Progress Notes (Signed)
Pt transferred from 2500 to 2002; pt very SOB on exertion; sats 100% 2L; RT in room to provide neb treatment at this time; pt concerned about not being on home meds Combivent and Spiriva; MD paged regarding issue; for now hold home meds since pt is on Solumedrol and Atrovent nebs; pt informed; will cont. To monitor.

## 2012-04-18 ENCOUNTER — Inpatient Hospital Stay (HOSPITAL_COMMUNITY): Payer: Medicare Other

## 2012-04-18 DIAGNOSIS — I517 Cardiomegaly: Secondary | ICD-10-CM

## 2012-04-18 LAB — EXPECTORATED SPUTUM ASSESSMENT W GRAM STAIN, RFLX TO RESP C: Special Requests: NORMAL

## 2012-04-18 MED ORDER — FUROSEMIDE 10 MG/ML IJ SOLN
40.0000 mg | Freq: Once | INTRAMUSCULAR | Status: AC
  Administered 2012-04-18: 40 mg via INTRAVENOUS
  Filled 2012-04-18: qty 4

## 2012-04-18 MED ORDER — BISACODYL 5 MG PO TBEC
10.0000 mg | DELAYED_RELEASE_TABLET | Freq: Every day | ORAL | Status: DC | PRN
  Administered 2012-04-18 – 2012-04-20 (×2): 10 mg via ORAL
  Filled 2012-04-18 (×2): qty 2

## 2012-04-18 MED ORDER — LEVOFLOXACIN 750 MG PO TABS
750.0000 mg | ORAL_TABLET | Freq: Every day | ORAL | Status: DC
  Administered 2012-04-18 – 2012-04-20 (×3): 750 mg via ORAL
  Filled 2012-04-18 (×3): qty 1

## 2012-04-18 MED ORDER — PREDNISONE 20 MG PO TABS
40.0000 mg | ORAL_TABLET | Freq: Two times a day (BID) | ORAL | Status: DC
  Administered 2012-04-18 – 2012-04-20 (×4): 40 mg via ORAL
  Filled 2012-04-18 (×6): qty 2

## 2012-04-18 MED ORDER — METHYLPREDNISOLONE SODIUM SUCC 125 MG IJ SOLR
125.0000 mg | Freq: Once | INTRAMUSCULAR | Status: AC
  Administered 2012-04-18: 125 mg via INTRAVENOUS
  Filled 2012-04-18: qty 2

## 2012-04-18 MED ORDER — IOHEXOL 350 MG/ML SOLN
100.0000 mL | Freq: Once | INTRAVENOUS | Status: AC | PRN
  Administered 2012-04-18: 100 mL via INTRAVENOUS

## 2012-04-18 NOTE — Progress Notes (Signed)
Physical Therapy Treatment Patient Details Name: [name] MRN: [id number] DOB: 1934-12-18 Today's Date: 04/18/2012 Time: 4098-1191 PT Time Calculation (min): 14 min  PT Assessment / Plan / Recommendation Comments on Treatment Session  Pt shows improvement today but she continues to experience 4/4 DOE with ambulation. She is questioning why she is so out of breath yet her O2 sats are adequate at 95%. Pulmonologist present at end of session and discussing this.  PT will continue to follow to advance mobility as well as teach energy conservation.     Follow Up Recommendations  Supervision/Assistance - 24 hour;Home health PT     Does the patient have the potential to tolerate intense rehabilitation     Barriers to Discharge        Equipment Recommendations  None recommended by PT    Recommendations for Other Services    Frequency Min 3X/week   Plan Frequency remains appropriate;Discharge plan needs to be updated    Precautions / Restrictions Precautions Precautions: Fall Restrictions Weight Bearing Restrictions: No   Pertinent Vitals/Pain HR 120 bpm, O2 sats 95% on 3L, with ambulation    Mobility  Bed Mobility Bed Mobility: Not assessed Transfers Transfers: Sit to Stand;Stand to Sit Sit to Stand: 6: Modified independent (Device/Increase time);From bed;With upper extremity assist Stand to Sit: 6: Modified independent (Device/Increase time);To bed;With upper extremity assist Ambulation/Gait Ambulation/Gait Assistance: 4: Min guard Ambulation Distance (Feet): 125 Feet Assistive device: None Ambulation/Gait Assistance Details: Pt did not have to take rest breaks today with ambulation and was able to tolerate increased distance but she felt she could absolutely not go anymore after 125'. HR 120 bpm, O2 sats 95% on 3L O2. Mild unsteadiness with turning but pt self-corrected.  Discussed energy conservation with ambulation and increasing frequency of walking when it is not  possible to increase distance. Gait Pattern: Step-through pattern;Decreased stride length;Shuffle;Narrow base of support Gait velocity: decreased Stairs: No Wheelchair Mobility Wheelchair Mobility: No    Exercises     PT Diagnosis:    PT Problem List:   PT Treatment Interventions:     PT Goals Acute Rehab PT Goals PT Goal Formulation: With patient Time For Goal Achievement: 05/01/12 Potential to Achieve Goals: Good Pt will go Sit to Stand: Independently PT Goal: Sit to Stand - Progress: Progressing toward goal Pt will Ambulate: 51 - 150 feet;with modified independence;with least restrictive assistive device PT Goal: Ambulate - Progress: Progressing toward goal Pt will Go Up / Down Stairs: 3-5 stairs;with supervision;with least restrictive assistive device PT Goal: Up/Down Stairs - Progress: Progressing toward goal  Visit Information  Last PT Received On: 04/18/12 Assistance Needed: +1    Subjective Data  Subjective: Even when the numbers are good I feel like my breathing is bad Patient Stated Goal: To go home when feeling better   Cognition  Overall Cognitive Status: Appears within functional limits for tasks assessed/performed Arousal/Alertness: Awake/alert Orientation Level: Appears intact for tasks assessed Behavior During Session: [facility] for tasks performed    Balance  Balance Balance Assessed: Yes  End of Session PT - End of Session Equipment Utilized During Treatment: Gait belt;Oxygen Activity Tolerance: Patient limited by fatigue Patient left: in bed;with call bell/phone within reach;with family/visitor present Nurse Communication: Mobility status   GP   [name], PT  Acute Rehab Services  [phone]   [name] 04/18/2012, 2:04 PM

## 2012-04-18 NOTE — Progress Notes (Signed)
Pt HR 120's-130's.  MD notified.  No new orders at this time.  MD wants to be called back if rhythm changes or rate sustains above 130.  Will report thoroughly to night RN.

## 2012-04-18 NOTE — Progress Notes (Signed)
  Echocardiogram 2D Echocardiogram has been performed.  [name] 04/18/2012, 4:18 PM

## 2012-04-18 NOTE — Progress Notes (Signed)
I personally seen and examined the patient. Agree with Brett Canales Minor's note. Continues to have severe dyspnea especially on exertion. Walking down the hall she developed tachycardia HR 130 and severe dyspnea, such that she cannot complete sentences. It is possible that she has a component of dynamic hyperinflation contributing to these findings, but I am concerned that she might have a PE ; also given her history of smoking, I would like her to have a 2 D echocardiogram to evaluate EF. I will give her an extra dose of IV steroids and I will add Levaquin to her current antibiotic regimen (continues to cough green sputum). She was able to expectorate sputum and we will follow up on the results.

## 2012-04-18 NOTE — Care Management Note (Signed)
    Page 1 of 2   04/20/2012     1:56:49 PM   CARE MANAGEMENT NOTE 04/20/2012  Patient:  [name], [name]   Account Number:  [id number]  Date Initiated:  04/18/2012  Documentation initiated by:  Shuntell Foody  Subjective/Objective Assessment:   PT ADM WITH COPD EXACERBATION ON 04/16/12.  PTA, PT INDEPENDENT, LIVES WITH SPOUSE.  SHE IS OXYGEN DEPENDENT, AND GETS O2 SUPPLIED BY [facility].     Action/Plan:   MET WITH PT AND SON TO DISCUSS DC PLANS.  HUSBAND AND FAMILY TO PROVIDE CARE AT DC.  WILL FOLLOW FOR HOME NEEDS AS PT PROGRESSES.   Anticipated DC Date:  04/20/2012   Anticipated DC Plan:  HOME W HOME HEALTH SERVICES      DC Planning Services  CM consult      [facility] Choice  HOME HEALTH   Choice offered to / List presented to:  C-1 Patient        HH arranged  HH-1 RN  HH-10 DISEASE MANAGEMENT  HH-2 PT      HH agency  [facility]   Status of service:  Completed, signed off Medicare Important Message given?   (If response is "NO", the following Medicare IM given date fields will be blank) Date Medicare IM given:   Date Additional Medicare IM given:    Discharge Disposition:  HOME W HOME HEALTH SERVICES  Per UR Regulation:  Reviewed for med. necessity/level of care/duration of stay  If discussed at Long Length of Stay Meetings, dates discussed:    Comments:  04/20/12 Jeryn Cerney,RN,BSN [phone] PT FOR DC HOME TODAY; ORDERS FOR HOME HEALTH FOLLOW UP OBTAINED FROM NP, AND PT AGREEABLE TO HOME HEALTH CARE. REFERRAL TO [facility] WITH [facility] FOR RN AND PT.   START OF CARE 24-48H POST DC DATE.  04/18/12 Niang Mitcheltree,RN,BSN [phone]  PHYSICAL THERAPIST RECOMMENDING HOME HEALTH FOLLOW UP AT DISCHARGE.  WOULD ALSO RECOMMEND HHRN FOR DISEASE MANAGEMENT OF COPD.  MD, IF YOU AGREE, PLEASE LEAVE ORDERS FOR HHRN AND HHPT.  THANKS!

## 2012-04-18 NOTE — Progress Notes (Signed)
SUBJ:  Wants her nebs, increased wob   OBJ: Filed Vitals:   04/18/12 0027 04/18/12 0200 04/18/12 0510 04/18/12 0852  BP:   131/65   Pulse: 82  88   Temp:   98.3 F (36.8 C)   TempSrc:   Oral   Resp:  20 16   Height:      Weight:      SpO2:   98% 99%    Lab 04/17/12 0655 04/16/12 1523  NA 134* 134*  K 4.6 3.6  CL 97 94*  CO2 28 25  BUN 11 9  CREATININE 0.57 0.55  GLUCOSE 154* 202*    Lab 04/17/12 0655 04/16/12 1523  HGB 13.6 15.3*  HCT 39.3 43.3  WBC 13.4* 21.9*  PLT 275 300  Dg Chest Port 1 View  04/16/2012  *RADIOLOGY REPORT*  Clinical Data: Shortness of breath and hypertension  PORTABLE CHEST - 1 VIEW  Comparison: 07/14/2011  Findings: Normal heart size with clear lung fields.  No bony abnormality.  Hyperinflation consistent with COPD. Calcified tortuous aorta.  Stable appearance compared with priors.  IMPRESSION: COPD.  No active disease.   Original Report Authenticated By: Elsie Stain, M.D.     10 8 2  d>>  NAD  pursed lip breathing. Increased wob at rest BS diminished without wheezing RRR S M NABS No edema     Principal Problem:  *COPD with exacerbation Active Problems:  HTN (hypertension)  Acute respiratory failure  Hyperlipidemia  Hypoxemia  DISC: Her illness onset was with "cold" symptoms and her husband has recently been diagnosed with bronchitis Not requiring NPPV since admission   PLAN: Taper steroids(changed to po 10/9 40 bid) Cont BDs  doxycycline X 7 days(started 10/8) Rest of medical regimen reviewed and will remain as is Advance activity Diuresis x 1(10/9 lasix 40 and check bnp)(check 2 d ro cardiac component 10/9) Continue O2 as per home She will be ready for discharge as soon as she feels that she can do the necessary ADLs  Brett Canales Roel Douthat ACNP [facility] PCCM Pager [phone] till 3 pm If no answer page [phone] 04/18/2012, 9:53 AM

## 2012-04-19 DIAGNOSIS — R918 Other nonspecific abnormal finding of lung field: Secondary | ICD-10-CM | POA: Diagnosis present

## 2012-04-19 DIAGNOSIS — I5189 Other ill-defined heart diseases: Secondary | ICD-10-CM | POA: Diagnosis present

## 2012-04-19 LAB — CBC
Hemoglobin: 14 g/dL (ref 12.0–15.0)
Platelets: 349 10*3/uL (ref 150–400)
RBC: 4.36 MIL/uL (ref 3.87–5.11)
WBC: 11.2 10*3/uL — ABNORMAL HIGH (ref 4.0–10.5)

## 2012-04-19 LAB — BASIC METABOLIC PANEL
CO2: 32 mEq/L (ref 19–32)
Chloride: 93 mEq/L — ABNORMAL LOW (ref 96–112)
Glucose, Bld: 128 mg/dL — ABNORMAL HIGH (ref 70–99)
Sodium: 136 mEq/L (ref 135–145)

## 2012-04-19 LAB — STREP PNEUMONIAE URINARY ANTIGEN: Strep Pneumo Urinary Antigen: NEGATIVE

## 2012-04-19 LAB — MAGNESIUM: Magnesium: 2.2 mg/dL (ref 1.5–2.5)

## 2012-04-19 LAB — PHOSPHORUS: Phosphorus: 3 mg/dL (ref 2.3–4.6)

## 2012-04-19 MED ORDER — METOPROLOL TARTRATE 25 MG PO TABS
25.0000 mg | ORAL_TABLET | Freq: Two times a day (BID) | ORAL | Status: DC
  Administered 2012-04-19 – 2012-04-20 (×2): 25 mg via ORAL
  Filled 2012-04-19 (×3): qty 1

## 2012-04-19 MED ORDER — NYSTATIN 100000 UNIT/ML MT SUSP
5.0000 mL | Freq: Four times a day (QID) | OROMUCOSAL | Status: DC
  Administered 2012-04-19 – 2012-04-20 (×3): 500000 [IU] via ORAL
  Filled 2012-04-19 (×6): qty 5

## 2012-04-19 MED ORDER — FLUCONAZOLE 100 MG PO TABS
100.0000 mg | ORAL_TABLET | Freq: Once | ORAL | Status: AC
  Administered 2012-04-19: 100 mg via ORAL
  Filled 2012-04-19: qty 1

## 2012-04-19 MED ORDER — FUROSEMIDE 10 MG/ML IJ SOLN
40.0000 mg | Freq: Once | INTRAMUSCULAR | Status: AC
  Administered 2012-04-19: 40 mg via INTRAVENOUS
  Filled 2012-04-19: qty 4

## 2012-04-19 NOTE — Progress Notes (Signed)
SUBJ:  Looks more comfortable   OBJ: Filed Vitals:   04/19/12 0234 04/19/12 0544 04/19/12 0717 04/19/12 0929  BP:  169/78 148/78   Pulse:  100    Temp:  97.7 F (36.5 C)    TempSrc:  Oral    Resp:  18    Height:      Weight:      SpO2: 98% 99%  99%    Lab 04/19/12 0615 04/17/12 0655 04/16/12 1523  NA 136 134* 134*  K 4.8 4.6 3.6  CL 93* 97 94*  CO2 32 28 25  BUN 20 11 9   CREATININE 0.71 0.57 0.55  GLUCOSE 128* 154* 202*    Intake/Output Summary (Last 24 hours) at 04/19/12 1016 Last data filed at 04/18/12 2110  Gross per 24 hour  Intake    240 ml  Output    300 ml  Net    -60 ml    BNP    Component Value Date/Time   PROBNP 795.7* 04/19/2012 0615     Lab 04/19/12 0615 04/17/12 0655 04/16/12 1523  HGB 14.0 13.6 15.3*  HCT 40.5 39.3 43.3  WBC 11.2* 13.4* 21.9*  PLT 349 275 300  Dg Chest 2 View  04/18/2012  *RADIOLOGY REPORT*  Clinical Data: COPD with shortness of breath and productive cough.  CHEST - 2 VIEW  Comparison: 04/16/2012.  07/14/2011.  Findings: Hyperexpansion is consistent with emphysema.  Adjacent small nodular opacities are seen in the right midlung, better visualized on this two-view exam than they were on the portable study from 2 days ago.  These are not visible on the two-view exam from 07/14/2011.  No pulmonary edema or focal airspace consolidation.  No pleural effusion. The cardiopericardial silhouette is within normal limits for size. Imaged bony structures of the thorax are intact.  IMPRESSION: Emphysema with small nodular densities projecting over the right midlung.  CT chest without contrast recommended to further evaluate.   Original Report Authenticated By: ERIC A. MANSELL, M.D.    Ct Angio Chest Pe W/cm &/or Wo Cm  04/18/2012  *RADIOLOGY REPORT*  Clinical Data: Shortness of breath.  Abnormal chest radiograph.  CT ANGIOGRAPHY CHEST  Technique:  Multidetector CT imaging of the chest using the standard protocol during bolus administration of  intravenous contrast. Multiplanar reconstructed images including MIPs were obtained and reviewed to evaluate the vascular anatomy.  Contrast: OMNIPAQUE IOHEXOL 350 MG/ML SOLN  Comparison: Chest radiograph 04/18/2012.  Findings: No pulmonary embolus.  No pathologically enlarged mediastinal, hilar or axillary lymph nodes.  Atherosclerotic calcification of the arterial vasculature, including coronary arteries.  Heart size normal.  Lipomatous hypertrophy of the interatrial septum is incidentally noted.  No pericardial effusion.  Biapical pleural parenchymal scarring.  Emphysema. There are scattered nodular lesions in the lungs, most prominent in the right upper lobe, measuring up to 7 mm (6 x 8 mm).  No pleural fluid. Airway is unremarkable.  Incidental imaging of the upper abdomen shows no acute findings. No worrisome lytic or sclerotic lesions.  IMPRESSION:  1.  No pulmonary embolus. 2.  Scattered bilateral pulmonary nodules, predominating in the right upper lobe.  As this patient is at increased risk for bronchogenic carcinoma, follow-up is recommended in 3-6 months. This recommendation follows the consensus statement: Guidelines for Management of Small Pulmonary Nodules Detected on CT Scans:  A Statement from the Fleischner Society as published in Radiology 2005; 237:395-400.   Original Report Authenticated By: Reyes Ivan, M.D.     Alfie.Easterly  8 2 d>>ejection fraction was in the range of 70% to 75%.(grade 1 diastolic dysfunction). Right ventricle: The cavity size was normal. Wall thickness was normal. Systolic function was normal.       NAD Decreased pursed lip breathing. decreased wob at rest BS diminished without wheezing RRR S M NABS No edema     Principal Problem:  *COPD with exacerbation Active Problems:  HTN (hypertension)  Acute respiratory failure  Hyperlipidemia  Hypoxemia  DISC: Her illness onset was with "cold" symptoms and her husband has recently been diagnosed with  bronchitis Not requiring NPPV since admission   PLAN: Taper steroids(changed to po 10/9 40 bid) Cont BDs  doxycycline X 7 days(started 10/8)  Levaquin X7 (started 10/9) Rest of medical regimen reviewed and will remain as is Advance activity Diuresis x 1(10/9 lasix 40 . Better 10-10, repeat lasix 10/10 Continue O2 as per home She will be ready for discharge as soon as she feels that she can do the necessary ADLs She is ready for discharge within 24 hours. She does not want to see Dr. Sherene Sires in future, will arrange for follow up with different MD. Dr Sherene Sires aware and agreeably to her seeing another MD and Dr. Inis Sizer has been approached.  Brett Canales Minor ACNP [facility] PCCM Pager [phone] till 3 pm If no answer page [phone] 04/19/2012, 10:13 AM  I personally have seen and examined Msr. Pricilla Holm. I agree with Harrell Gave note with the exception noted below.   In brief Mrs. Woolford is a 77 yo woman with COPD complicated by chronic hypoxic respiratory insufficiency requiring home O2 supplementation at 2L oxygen canula at rest and with ambulation, who presented to the hospital for COPD exacerbation. Improving on antibiotics and steroids, to continue oral therapy with Levaquin and Doxy at discharge.   During hospitalization she was found to have lung nodules 6-8 mm; she will need to have a repeat CT chest in 3 months for further evaluation. She is at [facility].   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 For this reason I would recommend follow up with cardiology. She develops tachycardia and severe dyspnia with any exertion and I suspect that her symptoms are partially related to the dynamic LV outflow obstruction. I will increase the metoprolol to 25 mg twice daily.   Vanetta Mulders, MD  [facility]  [place], [place] [phone]

## 2012-04-19 NOTE — Progress Notes (Signed)
Patient evaluated for long-term disease management services with [facility] Care Management Program as a benefit of her [employer] Medicare insurance. Spoke with Mrs Trower at bedside to explain services and sign consents. States she is familiar with [facility] Care Management because her husband has been followed by [facility] Care Management as well. Left [facility] Care Management packet at bedside. Informed her that she will receive a post discharge transition of care call and monthly home visits for assessments and for education. These services do not interfere or replace services that are arranged by inpatient case management. Noted discharge plan is home with home health services. Patient lives with husband. Mrs Fore appreciative of visit.  Raiford Noble, MSN- Ed, RN,BSN [facility] Liaison, [phone]

## 2012-04-19 NOTE — Progress Notes (Signed)
Occupational Therapy Treatment Patient Details Name: [name] MRN: [id number] DOB: 05-Nov-1934 Today's Date: 04/19/2012 Time: 7829-5621 OT Time Calculation (min): 24 min  OT Assessment / Plan / Recommendation Comments on Treatment Session Pt. with improved activity tolerance, but continues to fatigue quickly.  Pt has been instructed in energy conservation techniques, and provided with initial HEP for UB strengthening    Follow Up Recommendations  Home health OT;Supervision/Assistance - 24 hour;Other (comment) (Pt. would benefit from pulmonary rehab once stronger)    Barriers to Discharge       Equipment Recommendations  None recommended by PT;None recommended by OT    Recommendations for Other Services    Frequency Min 2X/week   Plan Discharge plan remains appropriate    Precautions / Restrictions Precautions Precautions: Fall Restrictions Weight Bearing Restrictions: No   Pertinent Vitals/Pain     ADL  Grooming: Simulated;Wash/dry hands;Wash/dry face;Teeth care;Supervision/safety Where Assessed - Grooming: Supported standing Toilet Transfer: Buyer, retail Method: Sit to Barista: Comfort height toilet Transfers/Ambulation Related to ADLs: Ambulates with supervision, but fatigues quickly - dyspnea 4/4. ADL Comments: Pt. instructed in energy conservation techniques and verbalized understanding.  Pt also instructed in level 2 theraband HEP (bil horizontal abduction) coordinating with breathing which is difficult with pt.  Instructed her to perform 5 reps several times a day and focus on breathing    OT Diagnosis:    OT Problem List:   OT Treatment Interventions:     OT Goals ADL Goals ADL Goal: Grooming - Progress: Progressing toward goals ADL Goal: Upper Body Bathing - Progress: Progressing toward goals ADL Goal: Lower Body Bathing - Progress: Progressing toward goals ADL Goal: Lower Body Dressing - Progress:  Progressing toward goals ADL Goal: Toilet Transfer - Progress: Progressing toward goals ADL Goal: Toileting - Clothing Manipulation - Progress: Progressing toward goals ADL Goal: Additional Goal #1 - Progress: Progressing toward goals ADL Goal: Additional Goal #2 - Progress: Progressing toward goals  Visit Information  Last OT Received On: 04/19/12 Assistance Needed: +1    Subjective Data  Subjective: "I still get really tired" Patient Stated Goal: To get stronger   Prior Functioning       Cognition  Overall Cognitive Status: Appears within functional limits for tasks assessed/performed Arousal/Alertness: Awake/alert Orientation Level: Appears intact for tasks assessed Behavior During Session: [facility] for tasks performed    Mobility  Shoulder Instructions Transfers Transfers: Sit to Stand;Stand to Sit Sit to Stand: 6: Modified independent (Device/Increase time);From bed;With upper extremity assist Stand to Sit: 6: Modified independent (Device/Increase time);To bed;With upper extremity assist Details for Transfer Assistance: Pt moves well.  DOE 3/4 -4/4.  Has to take frequent rest breaks due to feeling she can't continue.       Exercises      Balance     End of Session OT - End of Session Activity Tolerance: Patient limited by fatigue Patient left: in bed;with call bell/phone within reach;with family/visitor present (sitting EOB)  GO     Jaqulyn Chancellor M 04/19/2012, 12:16 PM

## 2012-04-20 DIAGNOSIS — J9600 Acute respiratory failure, unspecified whether with hypoxia or hypercapnia: Secondary | ICD-10-CM

## 2012-04-20 DIAGNOSIS — J441 Chronic obstructive pulmonary disease with (acute) exacerbation: Secondary | ICD-10-CM

## 2012-04-20 DIAGNOSIS — J449 Chronic obstructive pulmonary disease, unspecified: Secondary | ICD-10-CM

## 2012-04-20 LAB — BASIC METABOLIC PANEL
CO2: 32 mEq/L (ref 19–32)
Chloride: 91 mEq/L — ABNORMAL LOW (ref 96–112)
Creatinine, Ser: 0.76 mg/dL (ref 0.50–1.10)
GFR calc Af Amer: >90 mL/min (ref >90)
Potassium: 4.5 mEq/L (ref 3.5–5.1)
Sodium: 134 mEq/L — ABNORMAL LOW (ref 135–145)

## 2012-04-20 LAB — LEGIONELLA ANTIGEN, URINE

## 2012-04-20 LAB — PRO B NATRIURETIC PEPTIDE: Pro B Natriuretic peptide (BNP): 604.6 pg/mL — ABNORMAL HIGH (ref 0–125)

## 2012-04-20 MED ORDER — NYSTATIN 100000 UNIT/ML MT SUSP: 5.0000 mL | Freq: Four times a day (QID) | OROMUCOSAL | Status: DC

## 2012-04-20 MED ORDER — PREDNISONE 10 MG PO TABS: ORAL_TABLET | ORAL | Status: DC

## 2012-04-20 MED ORDER — DOXYCYCLINE HYCLATE 100 MG PO TABS: 100.0000 mg | ORAL_TABLET | Freq: Two times a day (BID) | ORAL | Status: DC

## 2012-04-20 MED ORDER — LEVOFLOXACIN 750 MG PO TABS: 750.0000 mg | ORAL_TABLET | Freq: Every day | ORAL | Status: DC

## 2012-04-20 NOTE — Progress Notes (Signed)
Physical Therapy Treatment Patient Details Name: [name] MRN: [id number] DOB: 1934/09/21 Today's Date: 04/20/2012 Time: 6578-4696 PT Time Calculation (min): 19 min  PT Assessment / Plan / Recommendation Comments on Treatment Session  Pt admitted with COPD exacerbation and is very motivated to progress with therapy. Pt able to increase ambulation distance and independence today, and remains limited by 4/4 DOE.    Follow Up Recommendations  Supervision/Assistance - 24 hour;Home health PT     Does the patient have the potential to tolerate intense rehabilitation     Barriers to Discharge        Equipment Recommendations  None recommended by PT;None recommended by OT    Recommendations for Other Services    Frequency Min 3X/week   Plan Discharge plan remains appropriate;Frequency remains appropriate    Precautions / Restrictions Precautions Precautions: Fall Restrictions Weight Bearing Restrictions: No   Pertinent Vitals/Pain None    Mobility  Bed Mobility Bed Mobility: Not assessed Transfers Transfers: Sit to Stand;Stand to Sit Sit to Stand: 6: Modified independent (Device/Increase time) Stand to Sit: 6: Modified independent (Device/Increase time) Ambulation/Gait Ambulation/Gait Assistance: 5: Supervision Ambulation Distance (Feet): 200 Feet Assistive device: Rolling walker Ambulation/Gait Assistance Details: Verbal cues for pursed lip breathing and safety directing RW with gait. Limited by DOE of 4/4 requiring seated rest. O2 sats on RA 94% at rest, on 2L O2 via [place] 95% at rest, and after ambulation 94% on 2L O2 via [place].  Gait Pattern: Step-through pattern;Decreased stride length;Shuffle;Narrow base of support Stairs: No Wheelchair Mobility Wheelchair Mobility: No    Exercises     PT Diagnosis:    PT Problem List:   PT Treatment Interventions:     PT Goals Acute Rehab PT Goals PT Goal Formulation: With patient Time For Goal Achievement:  05/01/12 Potential to Achieve Goals: Good PT Goal: Sit to Stand - Progress: Progressing toward goal PT Goal: Ambulate - Progress: Progressing toward goal  Visit Information  Last PT Received On: 04/20/12 Assistance Needed: +1    Subjective Data  Subjective: "I just don't know how far I can go." Patient Stated Goal: To go home when feeling better   Cognition  Overall Cognitive Status: Appears within functional limits for tasks assessed/performed Arousal/Alertness: Awake/alert Orientation Level: Appears intact for tasks assessed Behavior During Session: [facility] for tasks performed    Balance  Balance Balance Assessed: No  End of Session PT - End of Session Equipment Utilized During Treatment: Gait belt;Oxygen Activity Tolerance: Patient tolerated treatment well Patient left: in bed;with call bell/phone within reach;with family/visitor present (Sitting EOB.) Nurse Communication: Mobility status   GP     Cephus Shelling 04/20/2012, 10:31 AM  04/20/2012 Cephus Shelling, PT, DPT [phone]

## 2012-04-20 NOTE — Discharge Summary (Signed)
Physician Discharge Summary  Patient ID: [name] MRN: [id number] DOB/AGE: 13-Mar-1935 77 y.o.  Admit date: 04/16/2012 Discharge date: 04/20/2012  Problem List Principal Problem:  *COPD with exacerbation Active Problems:  HTN (hypertension)  Acute respiratory failure  Hyperlipidemia  Hypoxemia  Dynamic left ventricular outflow obstruction  Multiple lung nodules  HPI: 77 y/o WF, former smoker, with PMH of HTN, PVD, HLD, COPD presented to PCP office on 10/7 with 3 day history of progressive shortness of breath. Was noted to be in significant distress and transferred to [facility] via EMS. In ER, was placed on BiPap, rx'd with beta agonists with improvement in symptoms. PCCM called for admit. Patient denies cough, sputum production, fevers, chills, chest pain.   Hospital Course:  Ct Angio Chest Pe W/cm &/or Wo Cm  04/18/2012 *RADIOLOGY REPORT* Clinical Data: Shortness of breath. Abnormal chest radiograph. CT ANGIOGRAPHY CHEST Technique: Multidetector CT imaging of the chest using the standard protocol during bolus administration of intravenous contrast. Multiplanar reconstructed images including MIPs were obtained and reviewed to evaluate the vascular anatomy. Contrast: OMNIPAQUE IOHEXOL 350 MG/ML SOLN Comparison: Chest radiograph 04/18/2012. Findings: No pulmonary embolus. No pathologically enlarged mediastinal, hilar or axillary lymph nodes. Atherosclerotic calcification of the arterial vasculature, including coronary arteries. Heart size normal. Lipomatous hypertrophy of the interatrial septum is incidentally noted. No pericardial effusion. Biapical pleural parenchymal scarring. Emphysema. There are scattered nodular lesions in the lungs, most prominent in the right upper lobe, measuring up to 7 mm (6 x 8 mm). No pleural fluid. Airway is unremarkable. Incidental imaging of the upper abdomen shows no acute findings. No worrisome lytic or sclerotic lesions. IMPRESSION: 1. No pulmonary  embolus. 2. Scattered bilateral pulmonary nodules, predominating in the right upper lobe. As this patient is at increased risk for bronchogenic carcinoma, follow-up is recommended in 3-6 months. This recommendation follows the consensus statement: Guidelines for Management of Small Pulmonary Nodules Detected on CT Scans: A Statement from the Fleischner Society as published in Radiology 2005; 237:395-400. Original Report Authenticated By: Reyes Ivan, M.D.   10 8 2  d>>ejection fraction was in the range of 70% to 75%.(grade 1 diastolic dysfunction). Right ventricle: The cavity size was normal. Wall thickness was normal. Systolic function was normal.   NAD  Decreased pursed lip breathing. decreased wob at rest  BS diminished without wheezing  RRR S M  NABS  No edema  Principal Problem:  *COPD with exacerbation  Active Problems:  HTN (hypertension)  Acute respiratory failure  Hyperlipidemia  Hypoxemia   DISC:  Her illness onset was with "cold" symptoms and her husband has recently been diagnosed with bronchitis  Not requiring NPPV since admission  PLAN:  Taper steroids(changed to po 10/9 40 bid)  Cont BDs  doxycycline X 7 days(started 10/8)  Levaquin X7 (started 10/9)  Rest of medical regimen reviewed and will remain as is  Advance activity  Diuresis x 1(10/9 lasix 40 . Better 10-10, repeat lasix 10/10  Continue O2 as per home  She will be ready for discharge as soon as she feels that she can do the necessary ADLs  She is ready for discharge within 24 hours. She does not want to see Dr. Sherene Sires in future, will arrange for follow up with different MD. Dr Sherene Sires aware and agreeably to her seeing another MD and Dr. Inis Sizer agrees to see her.      Labs at discharge Lab Results  Component Value Date   CREATININE 0.76 04/20/2012   BUN 20 04/20/2012  NA 134* 04/20/2012   K 4.5 04/20/2012   CL 91* 04/20/2012   CO2 32 04/20/2012   Lab Results  Component Value Date   WBC 11.2*  04/19/2012   HGB 14.0 04/19/2012   HCT 40.5 04/19/2012   MCV 92.9 04/19/2012   PLT 349 04/19/2012   Lab Results  Component Value Date   ALT 16 04/17/2012   AST 20 04/17/2012   ALKPHOS 60 04/17/2012   BILITOT 0.3 04/17/2012   Lab Results  Component Value Date   INR 0.97 10/16/2009    Current radiology studies Dg Chest 2 View  04/18/2012  *RADIOLOGY REPORT*  Clinical Data: COPD with shortness of breath and productive cough.  CHEST - 2 VIEW  Comparison: 04/16/2012.  07/14/2011.  Findings: Hyperexpansion is consistent with emphysema.  Adjacent small nodular opacities are seen in the right midlung, better visualized on this two-view exam than they were on the portable study from 2 days ago.  These are not visible on the two-view exam from 07/14/2011.  No pulmonary edema or focal airspace consolidation.  No pleural effusion. The cardiopericardial silhouette is within normal limits for size. Imaged bony structures of the thorax are intact.  IMPRESSION: Emphysema with small nodular densities projecting over the right midlung.  CT chest without contrast recommended to further evaluate.   Original Report Authenticated By: ERIC A. MANSELL, M.D.    Ct Angio Chest Pe W/cm &/or Wo Cm  04/18/2012  *RADIOLOGY REPORT*  Clinical Data: Shortness of breath.  Abnormal chest radiograph.  CT ANGIOGRAPHY CHEST  Technique:  Multidetector CT imaging of the chest using the standard protocol during bolus administration of intravenous contrast. Multiplanar reconstructed images including MIPs were obtained and reviewed to evaluate the vascular anatomy.  Contrast: OMNIPAQUE IOHEXOL 350 MG/ML SOLN  Comparison: Chest radiograph 04/18/2012.  Findings: No pulmonary embolus.  No pathologically enlarged mediastinal, hilar or axillary lymph nodes.  Atherosclerotic calcification of the arterial vasculature, including coronary arteries.  Heart size normal.  Lipomatous hypertrophy of the interatrial septum is incidentally noted.  No  pericardial effusion.  Biapical pleural parenchymal scarring.  Emphysema. There are scattered nodular lesions in the lungs, most prominent in the right upper lobe, measuring up to 7 mm (6 x 8 mm).  No pleural fluid. Airway is unremarkable.  Incidental imaging of the upper abdomen shows no acute findings. No worrisome lytic or sclerotic lesions.  IMPRESSION:  1.  No pulmonary embolus. 2.  Scattered bilateral pulmonary nodules, predominating in the right upper lobe.  As this patient is at increased risk for bronchogenic carcinoma, follow-up is recommended in 3-6 months. This recommendation follows the consensus statement: Guidelines for Management of Small Pulmonary Nodules Detected on CT Scans:  A Statement from the Fleischner Society as published in Radiology 2005; 237:395-400.   Original Report Authenticated By: Reyes Ivan, M.D.     Disposition:  01-Home or Self Care  Discharge Orders    Future Appointments: Provider: Department: Dept Phone: Center:   04/27/2012 2:00 PM Julio Sicks, NP [facility] [phone] None   05/09/2012 2:00 PM Beatrice Lecher, PA [facility] [phone] [facility]   05/11/2012 2:00 PM Leslye Peer, MD [facility] [phone] None     Future Orders Please Complete By Expires   Discharge patient          Medication List     As of 04/20/2012  9:23 AM    STOP taking these medications  predniSONE 10 MG tablet   Commonly known as: STERAPRED UNI-PAK      TAKE these medications         albuterol 108 (90 BASE) MCG/ACT inhaler   Commonly known as: PROVENTIL HFA;VENTOLIN HFA   Inhale 2 puffs into the lungs every 6 (six) hours as needed. For shortness of breath      aspirin 81 MG tablet   Take 81 mg by mouth daily.      atorvastatin 10 MG tablet   Commonly known as: LIPITOR   Take 10 mg by mouth daily.      budesonide-formoterol 160-4.5 MCG/ACT inhaler   Commonly known as: SYMBICORT   Inhale 2 puffs into the  lungs 2 (two) times daily.      calcium-vitamin D 500-200 MG-UNIT per tablet   Commonly known as: OSCAL WITH D   Take 1 tablet by mouth 2 (two) times daily.      doxycycline 100 MG tablet   Commonly known as: VIBRA-TABS   Take 1 tablet (100 mg total) by mouth every 12 (twelve) hours.      levofloxacin 750 MG tablet   Commonly known as: LEVAQUIN   Take 1 tablet (750 mg total) by mouth daily.      metoprolol tartrate 25 MG tablet   Commonly known as: LOPRESSOR   Take 12.5 mg by mouth 2 (two) times daily.      multivitamin tablet   Take 1 tablet by mouth daily.      predniSONE 10 MG tablet   Commonly known as: DELTASONE   Take 4 tabs  daily with food x 4 days, then 3 tabs daily x 4 days, then 2 tabs daily x 4 days, then 1 tab daily x4 days then stop.      tiotropium 18 MCG inhalation capsule   Commonly known as: SPIRIVA   Place 18 mcg into inhaler and inhale daily.      VITAMIN D PO   Take 1 tablet by mouth daily.      VITAMIN E PO   Take 1 tablet by mouth daily.           Follow-up Information    Follow up with Tereso Newcomer, PA. On 05/09/2012. (2:00 PM)    Contact information:   [address] [place] [address] [phone]          Discharged Condition: {goodVital signs at Discharge. Temp:  [98 F (36.7 C)] 98 F (36.7 C) (10/10 2144) Pulse Rate:  [95-107] 100  (10/10 2331) Resp:  [18-20] 20  (10/10 2144) BP: (144-156)/(73-80) 156/73 mmHg (10/10 2331) SpO2:  [97 %-99 %] 97 % (10/11 0120) Office follow up Special Information or instructions. She is to follow up with Dr. Delton Coombes and Dr. Daleen Squibb as outpatient Signed: Brett Canales Minor ACNP [facility] PCCM Pager [phone] till 3 pm If no answer page [phone]  Pt seen and examined with Minor and I agree with the disposition and plan. Shan Levans MD 04/20/2012, 9:23 AM

## 2012-04-21 LAB — CULTURE, RESPIRATORY W GRAM STAIN: Culture: NORMAL

## 2012-04-23 ENCOUNTER — Telehealth: Payer: Self-pay | Admitting: Emergency Medicine

## 2012-04-23 NOTE — Telephone Encounter (Signed)
I spoke with [name] and she is requesting verbal okay for occupational therapy. Please advise thanks

## 2012-04-24 NOTE — Telephone Encounter (Signed)
lmomtcb x1 for karen 

## 2012-04-24 NOTE — Telephone Encounter (Signed)
OK to order this.  

## 2012-04-25 NOTE — Telephone Encounter (Signed)
Karen advised. Lillianna Sabel, CMA  

## 2012-04-27 ENCOUNTER — Encounter: Payer: Self-pay | Admitting: Adult Health

## 2012-04-27 ENCOUNTER — Ambulatory Visit (INDEPENDENT_AMBULATORY_CARE_PROVIDER_SITE_OTHER): Payer: Medicare Other | Admitting: Adult Health

## 2012-04-27 VITALS — BP 124/74 | HR 90 | Temp 97.0°F | Ht 61.0 in | Wt 125.2 lb

## 2012-04-27 DIAGNOSIS — R918 Other nonspecific abnormal finding of lung field: Secondary | ICD-10-CM

## 2012-04-27 DIAGNOSIS — J441 Chronic obstructive pulmonary disease with (acute) exacerbation: Secondary | ICD-10-CM

## 2012-04-27 NOTE — Assessment & Plan Note (Signed)
Recent exacerbation in a patient with underlying severe COPD and complicated by acute on chronic respiratory failure Patient has been instructed to finish her prednisone taper. Continue on her Spiriva and Symbicort as directed to wear oxygen at 2 L continuously. She will follow back up in the office is planning to 3 weeks and as needed Flu shot today

## 2012-04-27 NOTE — Progress Notes (Signed)
Subjective:    Patient ID: [name], female    DOB: [birth year], 77 y.o.   MRN: [id number]  HPI 25 yowf quit smoking 2007 , COPD - On home O2 since 07/2011  PFT's 08/05/10 FEV1 .57(34%) with ratio 33 no better after B2 and DLC0 50 and no desat x one lap    June 15, 2010 1st pulmonary office eval cc indolent onset progressive doe esp over the last month with doe x 50 feet, no sign cough or nocturnal co's. not sure inhalers are helping but not able to use hfa so try advair could not tol so back on symbicort 160.   June 29, 2010 ov cc doe no change, minimal cough. Work on Chemical engineer technique:  Start Spiriva one capsule each am right after symbicort 160 2 puffs first thing in am and 2 puffs again in pm about 12 hours l   August 05, 2010 ov better on spiriva, now doing 5 min x 2 mph and some tilt. no cough.      04/27/2012 Post Hospital follow up  Patient presents for a post hospital followup. She was admitted October 7 -05/2012 for a COPD exacerbation with acute on chronic respiratory failure She required initial BiPAP support. She was able to be weaned with aggressive pulmonary hygiene regimen. Along with IV antibiotics, and steroids. She underwent a CT chest that was negative for pulmonary emboli, however, shows scattered, bilateral pulmonary nodules, predominantly in the right upper lobe. She underwent an echo that showed an ejection fracture of 70-75% with grade 1 diastolic dysfunction, and mild atrial septum, hypertrophy She was discharged on Levaquin and a steroid taper. She has a few days left on her steroid taper and She is feeling some better but continues to feel weak and have dyspnea on exertion. She has not been seen in office in greater than a year and a half. Reports that she had a similar admission in January of this year and she was discharged on oxygen at home Prior to admission. She was able to do light housework. Short distance, shopping She denies any  hemoptysis, orthopnea, PND, or increased leg swelling  Review of Systems Constitutional:   No  weight loss, night sweats,  Fevers, chills,  +fatigue, or  lassitude.  HEENT:   No headaches,  Difficulty swallowing,  Tooth/dental problems, or  Sore throat,                No sneezing, itching, ear ache, nasal congestion, post nasal drip,   CV:  No chest pain,  Orthopnea, PND,   anasarca, dizziness, palpitations, syncope.   GI  No heartburn, indigestion, abdominal pain, nausea, vomiting, diarrhea, change in bowel habits, loss of appetite, bloody stools.   Resp:  No coughing up of blood.    No chest wall deformity  Skin: no rash or lesions.  GU: no dysuria, change in color of urine, no urgency or frequency.  No flank pain, no hematuria   MS:  No joint pain or swelling.  No decreased range of motion.  No back pain.  Psych:  No change in mood or affect. No depression or anxiety.  No memory loss.         Objective:   Physical Exam GEN: A/Ox3; pleasant , NAD , elderly in wheelchair    HEENT:  [place]/AT,  EACs-clear, TMs-wnl, NOSE-clear, THROAT-clear, no lesions, no postnasal drip or exudate noted.   NECK:  Supple w/ fair ROM; no JVD; normal carotid impulses w/o  bruits; no thyromegaly or nodules palpated; no lymphadenopathy.  RESP  Coarse BS diminished in bases  w/o, wheezes/ rales/ or rhonchi.no accessory muscle use, no dullness to percussion  CARD:  RRR, no m/r/g  , tr  peripheral edema, pulses intact, no cyanosis or clubbing.  GI:   Soft & nt; nml bowel sounds; no organomegaly or masses detected.  Musco: Warm bil, no deformities or joint swelling noted.   Neuro: alert, no focal deficits noted.    Skin: Warm, no lesions or rashes         Assessment & Plan:

## 2012-04-27 NOTE — Patient Instructions (Addendum)
Finish Prednisone taper as directed.  Flu shot today  follow up with Dr. Delton Coombes  As planned in few weeks and As needed   Please contact office for sooner follow up if symptoms do not improve or worsen or seek emergency care

## 2012-04-27 NOTE — Assessment & Plan Note (Signed)
CT chest 04/18/12 >w/ bilateral scattered nodules in Upper lobes  >will need repeat CT chest 3-6 months  Discuss on return with Dr. Delton Coombes

## 2012-05-09 ENCOUNTER — Encounter: Payer: Self-pay | Admitting: Physician Assistant

## 2012-05-09 ENCOUNTER — Ambulatory Visit (INDEPENDENT_AMBULATORY_CARE_PROVIDER_SITE_OTHER): Payer: Medicare Other | Admitting: Physician Assistant

## 2012-05-09 VITALS — BP 160/70 | HR 102 | Ht 61.0 in | Wt 123.4 lb

## 2012-05-09 DIAGNOSIS — I517 Cardiomegaly: Secondary | ICD-10-CM

## 2012-05-09 DIAGNOSIS — I10 Essential (primary) hypertension: Secondary | ICD-10-CM

## 2012-05-09 DIAGNOSIS — I6529 Occlusion and stenosis of unspecified carotid artery: Secondary | ICD-10-CM

## 2012-05-09 DIAGNOSIS — R609 Edema, unspecified: Secondary | ICD-10-CM

## 2012-05-09 DIAGNOSIS — I5189 Other ill-defined heart diseases: Secondary | ICD-10-CM

## 2012-05-09 MED ORDER — METOPROLOL TARTRATE 25 MG PO TABS: 25.0000 mg | ORAL_TABLET | Freq: Two times a day (BID) | ORAL | Status: DC

## 2012-05-09 NOTE — Patient Instructions (Addendum)
Your physician recommends that you schedule a follow-up appointment in: 07/18/2012 @ 11:10 AM WITH SCOTT WEAVER, PAC SAME DAY DR. WALL IS IN THE OFFICE  Your physician has requested that you have a lower extremity venous duplex 05/10/12 @ 12:30 pm; DX EDEMA r/o DVT . This test is an ultrasound of the veins in the legs or arms. It looks at venous blood flow that carries blood from the heart to the legs or arms. Allow one hour for a Lower Venous exam. Allow thirty minutes for an Upper Venous exam. There are no restrictions or special instructions.  INCREASE METOPROLOL TO 25 MG 1 TABLET TWICE DAILY A NEW PRESCRIPTION WAS SENT IN TODAY TO [facility] IN [place] FOR 90 DAY SUPPLY

## 2012-05-09 NOTE — Progress Notes (Signed)
[address] [place], [place]  [address] Phone: [phone], Fax:  [phone]  Date:  05/09/2012   Name:  [name]   DOB:  28-Aug-1934   MRN:  [id number]  PCP:  Herb Grays, MD  Primary Cardiologist:  Dr. Valera Castle  Primary Electrophysiologist:  None    History of Present Illness: [name] is a 77 y.o. female who returns for followup on her echocardiogram.  She has a history of peripheral vascular disease, HTN, COPD. She's had a question of infrarenal abdominal aorta dilatation on CT in the past. Subsequent ultrasounds have been negative for aneurysm. She last saw Dr. Daleen Squibb 8/12 after undergoing a "Life Screening Survey". This demonstrated mild carotid artery stenosis bilaterally (no percentage given) and mildly reduced ABIs bilaterally (0.9). Recommendation was to repeat her carotid Dopplers in 2 years. She was recently admitted 10/7-10/11 with a COPD exacerbation. She was followed by pulmonary/critical care. Chest CT was negative for pulmonary embolism. She did have multiple lung nodules noted and needs followup CT in 3-6 months. Echocardiogram 04/16/12: Mild focal basal septal hypertrophy, EF 70-75% with dynamic obstruction with mid cavity obliteration, grade 1 diastolic dysfunction, MAC, atrial septal thickening with findings consistent with lipomatous hypertrophy.  She is slowly recovering from her recent COPD exacerbation. She has chronic dyspnea with exertion. She is on chronic O2. Her breathing is somewhat better. She denies cough. She denies orthopnea, PND. She notes pedal edema. This seems to be worse since she was placed on prednisone. She has completed her course of prednisone now. Her right leg is bigger than left. She denies syncope. She denies chest pain.  Labs (10/13):   K 4.5, creatinine 0.76, ALT 16, Hgb 14  Wt Readings from Last 3 Encounters:  04/27/12 125 lb 3.2 oz (56.79 kg)  04/16/12 125 lb 14.1 oz (57.1 kg)  07/12/11 125 lb 14.1  oz (57.1 kg)     Past Medical History  Diagnosis Date  . PVD (peripheral vascular disease)   . Actinomycosis, cervicofacial   . Emphysema   . COPD (chronic obstructive pulmonary disease)   . HTN (hypertension)   . Hx of echocardiogram     Echocardiogram 04/16/12: Mild focal basal septal hypertrophy, EF 70-75% with dynamic obstruction with mid cavity obliteration, grade 1 diastolic dysfunction, MAC, atrial septal thickening with findings consistent with lipomatous hypertrophy    Current Outpatient Prescriptions  Medication Sig Dispense Refill  . albuterol (PROVENTIL HFA;VENTOLIN HFA) 108 (90 BASE) MCG/ACT inhaler Inhale 2 puffs into the lungs every 6 (six) hours as needed. For shortness of breath      . aspirin 81 MG tablet Take 81 mg by mouth daily.        Marland Kitchen atorvastatin (LIPITOR) 10 MG tablet Take 10 mg by mouth daily.        . budesonide-formoterol (SYMBICORT) 160-4.5 MCG/ACT inhaler Inhale 2 puffs into the lungs 2 (two) times daily.        . calcium-vitamin D (OSCAL WITH D) 500-200 MG-UNIT per tablet Take 1 tablet by mouth 2 (two) times daily.        . Cholecalciferol (VITAMIN D PO) Take 1 tablet by mouth daily.      . metoprolol tartrate (LOPRESSOR) 25 MG tablet Take 12.5 mg by mouth 2 (two) times daily.       . Multiple Vitamin (MULTIVITAMIN) tablet Take 1 tablet by mouth daily.        Marland Kitchen nystatin (MYCOSTATIN) 100000 UNIT/ML suspension Take 5  mLs (500,000 Units total) by mouth 4 (four) times daily.  60 mL  1  . predniSONE (DELTASONE) 10 MG tablet Take 4 tabs  daily with food x 4 days, then 3 tabs daily x 4 days, then 2 tabs daily x 4 days, then 1 tab daily x4 days then stop.  40 tablet  0  . tiotropium (SPIRIVA) 18 MCG inhalation capsule Place 18 mcg into inhaler and inhale daily.        Marland Kitchen VITAMIN E PO Take 1 tablet by mouth daily.         Allergies: Allergies  Allergen Reactions  . Penicillins Hives    Has taken it since.  . Pneumovax (Pneumococcal Polysaccharides) Hives     Social History:  The patient  reports that she quit smoking about 6 years ago. Her smoking use included Cigarettes. She does not have any smokeless tobacco history on file. She reports that she does not drink alcohol or use illicit drugs.   ROS:  Please see the history of present illness.   All other systems reviewed and negative.   PHYSICAL EXAM: VS:  BP 160/70  Pulse 102  Ht 5\' 1"  (1.549 m)  Wt 123 lb 6.4 oz (55.974 kg)  BMI 23.32 kg/m2 Well nourished, well developed, in no acute distress HEENT: normal Neck: no JVD Cardiac:  normal S1, S2; RRR; no murmur Lungs:  Decreased breath sounds bilaterally; minimal expiratory wheezes noted in the bases Abd: soft, nontender, no hepatomegaly Ext: 1+ ankle edema on the right; very trace edema noted on the left Skin: warm and dry Neuro:  CNs 2-12 intact, no focal abnormalities noted  EKG:  Sinus tachycardia, HR 102, normal axis, nonspecific ST-T wave changes      ASSESSMENT AND PLAN:  1. Left Ventricular Hypertrophy:   She had mild focal basal septal hypertrophy with dynamic obstruction with mid cavity obliteration and grade 1 diastolic dysfunction on echocardiogram. This may contribute somewhat to her dyspnea. Lung issues are the primary cause of her dyspnea.  I will titrate her beta blocker further to help slow her heart rate. She continues to have some mild wheezing after her recent COPD exacerbation. I suspect her lung issues are driving her heart rate as well. I will have her increase her metoprolol gently to 25 mg twice a day. She will continue followup with pulmonology. We'll bring her back in followup with me or Dr. Daleen Squibb in 6-8 weeks.  2. Hypertension:   Adjust Metoprolol as noted.    3. Edema:   Asymmetric.  She had a recent hospitalization.  Arrange RLE venous dopplers to rule out DVT.  With dynamic obstruction on Echo, would try to avoid using diuretics.    4. Carotid Stenosis:   Dopplers due in 02/2013.  5. COPD:   Managed by  pulmonology.    Signed, Tereso Newcomer, PA-C  2:06 PM 05/09/2012

## 2012-05-10 ENCOUNTER — Encounter (INDEPENDENT_AMBULATORY_CARE_PROVIDER_SITE_OTHER): Payer: Medicare Other

## 2012-05-10 DIAGNOSIS — I10 Essential (primary) hypertension: Secondary | ICD-10-CM

## 2012-05-10 DIAGNOSIS — M79609 Pain in unspecified limb: Secondary | ICD-10-CM

## 2012-05-10 DIAGNOSIS — R609 Edema, unspecified: Secondary | ICD-10-CM

## 2012-05-10 DIAGNOSIS — M7989 Other specified soft tissue disorders: Secondary | ICD-10-CM

## 2012-05-10 DIAGNOSIS — I517 Cardiomegaly: Secondary | ICD-10-CM

## 2012-05-11 ENCOUNTER — Encounter: Payer: Self-pay | Admitting: Emergency Medicine

## 2012-05-11 ENCOUNTER — Ambulatory Visit (INDEPENDENT_AMBULATORY_CARE_PROVIDER_SITE_OTHER): Payer: Medicare Other | Admitting: Emergency Medicine

## 2012-05-11 VITALS — BP 120/70 | HR 73 | Temp 98.2°F | Ht 61.0 in | Wt 125.6 lb

## 2012-05-11 DIAGNOSIS — J449 Chronic obstructive pulmonary disease, unspecified: Secondary | ICD-10-CM

## 2012-05-11 DIAGNOSIS — R918 Other nonspecific abnormal finding of lung field: Secondary | ICD-10-CM

## 2012-05-11 NOTE — Assessment & Plan Note (Signed)
-   need repeat Ct scan in April 2014

## 2012-05-11 NOTE — Progress Notes (Signed)
Subjective:     Patient ID: [name], female   DOB: [birth year], 77 y.o.   MRN: [id number]  HPI 77 yo woman, former smoker (50 pk-yrs), hx COPD, HTN and diastolic CHF, PVD. She was admitted for AE-COPD 10/7-10/11. CT scan during that admission showed scattered LL nodular disease. She returns for f/u, has been taking symbicort and spiriva. This was her 2nd hospitalization for AE. She is now feeling almost back to baseline, still with DOE - hasn't been shopping by herself. Her FEV1 was 0.57L in 07/2010. Very little cough, sputum. She does occasionally wheeze.  She uses SABA several times a week.   Review of Systems  Constitutional: Negative for fever and unexpected weight change.  HENT: Negative for ear pain, nosebleeds, congestion, sore throat, rhinorrhea, sneezing, trouble swallowing, dental problem, postnasal drip and sinus pressure.   Eyes: Negative for redness and itching.  Respiratory: Positive for chest tightness, shortness of breath and wheezing. Negative for cough.   Cardiovascular: Negative for palpitations and leg swelling.  Gastrointestinal: Negative for nausea and vomiting.  Genitourinary: Negative for dysuria.  Musculoskeletal: Negative for joint swelling.  Skin: Negative for rash.  Neurological: Negative for headaches.  Hematological: Does not bruise/bleed easily.  Psychiatric/Behavioral: Negative for dysphoric mood. The patient is not nervous/anxious.    Past Medical History  Diagnosis Date  . PVD (peripheral vascular disease)   . Actinomycosis, cervicofacial   . Emphysema   . COPD (chronic obstructive pulmonary disease)   . HTN (hypertension)   . Hx of echocardiogram     Echocardiogram 04/16/12: Mild focal basal septal hypertrophy, EF 70-75% with dynamic obstruction with mid cavity obliteration, grade 1 diastolic dysfunction, MAC, atrial septal thickening with findings consistent with lipomatous hypertrophy  . Hyperlipidemia      Family History  Problem Relation Age  of Onset  . Coronary artery disease      family hx of 1st. degree relative <60  . Coronary artery disease      family hx of female 1st. degree relative <50  . Emphysema Father   . Emphysema      3 sisters. they were smokers  . Stomach cancer Sister      History   Social History  . Marital Status: Married    Spouse Name: N/A    Number of Children: 4  . Years of Education: N/A   Occupational History  . retired     [employer]   Social History Main Topics  . Smoking status: Former Smoker -- 1.0 packs/day for 49 years    Types: Cigarettes    Quit date: 07/11/2005  . Smokeless tobacco: Never Used   Comment: smoked for 48 years 1 ppd  . Alcohol Use: No  . Drug Use: No  . Sexually Active: Not on file   Other Topics Concern  . Not on file   Social History Narrative  . No narrative on file     Allergies  Allergen Reactions  . Penicillins Hives    Has taken it since.  . Pneumovax (Pneumococcal Polysaccharides) Hives     Outpatient Prescriptions Prior to Visit  Medication Sig Dispense Refill  . albuterol (PROVENTIL HFA;VENTOLIN HFA) 108 (90 BASE) MCG/ACT inhaler Inhale 2 puffs into the lungs every 6 (six) hours as needed. For shortness of breath      . aspirin 81 MG tablet Take 81 mg by mouth daily.        Marland Kitchen atorvastatin (LIPITOR) 10 MG tablet Take 10 mg by  mouth daily.        . budesonide-formoterol (SYMBICORT) 160-4.5 MCG/ACT inhaler Inhale 2 puffs into the lungs 2 (two) times daily.        . Multiple Vitamin (MULTIVITAMIN) tablet Take 1 tablet by mouth daily.        Marland Kitchen tiotropium (SPIRIVA) 18 MCG inhalation capsule Place 18 mcg into inhaler and inhale daily.        . calcium-vitamin D (OSCAL WITH D) 500-200 MG-UNIT per tablet Take 1 tablet by mouth 2 (two) times daily.        . Cholecalciferol (VITAMIN D PO) Take 1 tablet by mouth daily.      . metoprolol tartrate (LOPRESSOR) 25 MG tablet Take 1 tablet (25 mg total) by mouth 2 (two) times daily.  180 tablet  3  .  VITAMIN E PO Take 1 tablet by mouth daily.       . predniSONE (DELTASONE) 10 MG tablet Take 4 tabs  daily with food x 4 days, then 3 tabs daily x 4 days, then 2 tabs daily x 4 days, then 1 tab daily x4 days then stop.  40 tablet  0        Objective:   Physical Exam Filed Vitals:   05/11/12 1410  BP: 120/70  Pulse: 73  Temp: 98.2 F (36.8 C)   Gen: Pleasant, elderly woman, in no distress,  normal affect  ENT: No lesions,  mouth clear,  oropharynx clear, no postnasal drip  Neck: No JVD, no TMG, no carotid bruits  Lungs: No use of accessory muscles, no dullness to percussion, clear without rales or rhonchi  Cardiovascular: RRR, heart sounds normal, no murmur or gallops, no peripheral edema  Abdomen: soft and NT, no HSM,  BS normal  Musculoskeletal: No deformities, no cyanosis or clubbing  Neuro: alert, non focal  Skin: Warm, no lesions or rashes    CT ANGIOGRAPHY CHEST 04/18/12 --  Technique: Multidetector CT imaging of the chest using the  standard protocol during bolus administration of intravenous  contrast. Multiplanar reconstructed images including MIPs were  obtained and reviewed to evaluate the vascular anatomy.  Contrast: OMNIPAQUE IOHEXOL 350 MG/ML SOLN  Comparison: Chest radiograph 04/18/2012.  Findings: No pulmonary embolus. No pathologically enlarged  mediastinal, hilar or axillary lymph nodes. Atherosclerotic  calcification of the arterial vasculature, including coronary  arteries. Heart size normal. Lipomatous hypertrophy of the  interatrial septum is incidentally noted. No pericardial effusion.  Biapical pleural parenchymal scarring. Emphysema. There are  scattered nodular lesions in the lungs, most prominent in the right  upper lobe, measuring up to 7 mm (6 x 8 mm). No pleural fluid.  Airway is unremarkable.  Incidental imaging of the upper abdomen shows no acute findings.  No worrisome lytic or sclerotic lesions.  IMPRESSION:  1. No pulmonary  embolus.  2. Scattered bilateral pulmonary nodules, predominating in the  right upper lobe. As this patient is at increased risk for  bronchogenic carcinoma, follow-up is recommended in 3-6 months.      Assessment:     COPD UNSPECIFIED Appears to be severe but improved and stable since hospitalization.  - continue the spiriva and symbicort - walking oximetry to confirm adequacy of 2L/min pulsed - flu shot already done - rov 3 mon  Multiple lung nodules - need repeat Ct scan in April 2014

## 2012-05-11 NOTE — Assessment & Plan Note (Signed)
Appears to be severe but improved and stable since hospitalization.  - continue the spiriva and symbicort - walking oximetry to confirm adequacy of 2L/min pulsed - flu shot already done - rov 3 mon

## 2012-05-11 NOTE — Patient Instructions (Addendum)
Please continue your Symbicort and Spiriva as you are taking them Wear your oxygen at 2L/min (pulsed is OK when using your tanks) Use albuterol 2 puffs if needed for shortness of breath CT scan of the chest in April 2014 Follow with Dr Delton Coombes in 3 months or sooner if you have any problems.

## 2012-05-14 ENCOUNTER — Telehealth: Payer: Self-pay | Admitting: *Deleted

## 2012-05-14 NOTE — Telephone Encounter (Signed)
Message copied by Tarri Fuller on Mon May 14, 2012  2:28 PM ------      Message from: [place], [place] T      Created: Mon May 14, 2012  1:40 PM       No DVT      Tereso Newcomer, PA-C  1:40 PM 05/14/2012

## 2012-05-14 NOTE — Telephone Encounter (Signed)
pt notified about LE venous duplex, no DVT. pt verbalized understanding today

## 2012-07-18 ENCOUNTER — Ambulatory Visit: Payer: Medicare Other | Admitting: Physician Assistant

## 2012-07-23 ENCOUNTER — Ambulatory Visit (INDEPENDENT_AMBULATORY_CARE_PROVIDER_SITE_OTHER): Payer: Medicare Other | Admitting: Physician Assistant

## 2012-07-23 ENCOUNTER — Encounter: Payer: Self-pay | Admitting: Physician Assistant

## 2012-07-23 VITALS — BP 154/78 | HR 55 | Ht 61.0 in | Wt 127.0 lb

## 2012-07-23 DIAGNOSIS — I779 Disorder of arteries and arterioles, unspecified: Secondary | ICD-10-CM

## 2012-07-23 DIAGNOSIS — I5189 Other ill-defined heart diseases: Secondary | ICD-10-CM

## 2012-07-23 DIAGNOSIS — I10 Essential (primary) hypertension: Secondary | ICD-10-CM

## 2012-07-23 DIAGNOSIS — R079 Chest pain, unspecified: Secondary | ICD-10-CM

## 2012-07-23 MED ORDER — FAMOTIDINE 20 MG PO TABS: ORAL_TABLET | ORAL | Status: DC

## 2012-07-23 NOTE — Patient Instructions (Addendum)
Your physician has requested that you have a dobutamine myoview, DX 786.50. For furth information please visit [email or web address] Please follow instruction sheet, as given.  START PEPCID 20 MG 1 TABLET TWICE DAILY FOR 1 MONTH THEN TAKE AS NEEDED  PLEASE FOLLOW UP WITH SCOTT WEAVER,PAC 2-3 WEEKS ( PREFERABLY SAME DR. WALL IS IN THE OFFICE)  PLEASE FOLLOW UP WITH DR. WALL IN 3-4 MONTHS

## 2012-07-23 NOTE — Progress Notes (Signed)
[address], [address] [place], [place]  [address] Phone: [phone], Fax:  [phone]  Date:  07/23/2012   Name:  [name]   DOB:  1935/04/23   MRN:  [id number]  PCP:  Herb Grays, MD  Primary Cardiologist:  Dr. Valera Castle  Primary Electrophysiologist:  None    History of Present Illness: [name] is a 77 y.o. female who returns for followup.  She has a hx of PAD, HTN, COPD. She's had a question of infrarenal abdominal aorta dilatation on CT in the past. Subsequent ultrasounds have been negative for aneurysm. She last saw Dr. Daleen Squibb 8/12 after undergoing a "Life Screening Survey". This demonstrated mild carotid artery stenosis bilaterally (no percentage given) and mildly reduced ABIs bilaterally (0.9). Recommendation was to repeat her carotid Dopplers in 02/2013. Admitted 04/2012 with a COPD exacerbation. She was followed by pulmonary/critical care. Chest CT was negative for pulmonary embolism. She did have multiple lung nodules noted and needs followup CT in 3-6 months. Echocardiogram 04/16/12: Mild focal basal septal hypertrophy, EF 70-75% with dynamic obstruction with mid cavity obliteration, grade 1 diastolic dysfunction, MAC, atrial septal thickening with findings consistent with lipomatous hypertrophy.  I saw her in followup 05/09/12. I titrated her beta blocker given her hypertrophic cardiomyopathy to slow her HR some.  She remains on chronic O2.  Chronic dyspnea is essentially unchanged.  She does note recent development of chest pain.  Symptoms occur at rest.  She denies exertional symptoms.  No associated radiation of symptoms, nausea or diaphoresis.  No syncope.  Overall, chest pain seems to be getting worse.  Of note, she had coronary calcifications on chest CT in 04/2012.  No hx of stress testing in the past.    Labs (10/13):   K 4.5, creatinine 0.76, ALT 16, Hgb 14 R LE ultrasound 10/13: Negative for DVT  Wt Readings from Last 3 Encounters:    07/23/12 127 lb (57.607 kg)  05/11/12 125 lb 9.6 oz (56.972 kg)  05/09/12 123 lb 6.4 oz (55.974 kg)     Past Medical History  Diagnosis Date  . PVD (peripheral vascular disease)   . Actinomycosis, cervicofacial   . Emphysema   . COPD (chronic obstructive pulmonary disease)   . HTN (hypertension)   . Hx of echocardiogram     Echocardiogram 04/16/12: Mild focal basal septal hypertrophy, EF 70-75% with dynamic obstruction with mid cavity obliteration, grade 1 diastolic dysfunction, MAC, atrial septal thickening with findings consistent with lipomatous hypertrophy  . Hyperlipidemia     Current Outpatient Prescriptions  Medication Sig Dispense Refill  . albuterol (PROVENTIL HFA;VENTOLIN HFA) 108 (90 BASE) MCG/ACT inhaler Inhale 2 puffs into the lungs every 6 (six) hours as needed. For shortness of breath      . aspirin 81 MG tablet Take 81 mg by mouth daily.        Marland Kitchen atorvastatin (LIPITOR) 10 MG tablet Take 10 mg by mouth daily.        . budesonide-formoterol (SYMBICORT) 160-4.5 MCG/ACT inhaler Inhale 2 puffs into the lungs 2 (two) times daily.        . calcium-vitamin D (OSCAL WITH D) 500-200 MG-UNIT per tablet Take 1 tablet by mouth 2 (two) times daily.        . metoprolol tartrate (LOPRESSOR) 25 MG tablet Take 1 tablet (25 mg total) by mouth 2 (two) times daily.  180 tablet  3  . Multiple Vitamin (MULTIVITAMIN) tablet Take 1 tablet by mouth daily.        Marland Kitchen  tiotropium (SPIRIVA) 18 MCG inhalation capsule Place 18 mcg into inhaler and inhale daily.          Allergies: Allergies  Allergen Reactions  . Penicillins Hives    Has taken it since.  . Pneumovax (Pneumococcal Polysaccharides) Hives    Social History:  The patient  reports that she quit smoking about 7 years ago. Her smoking use included Cigarettes. She has a 49 pack-year smoking history. She has never used smokeless tobacco. She reports that she does not drink alcohol or use illicit drugs.   ROS:  Please see the history of  present illness.  She denies dysphagia.  Denies chest pain associated with meals.  No significant indigestion.   All other systems reviewed and negative.   PHYSICAL EXAM: VS:  BP 154/78  Pulse 55  Ht 5\' 1"  (1.549 m)  Wt 127 lb (57.607 kg)  BMI 24.00 kg/m2  SpO2 99% Well nourished, well developed, in no acute distress HEENT: normal Neck: no JVD Cardiac:  normal S1, S2; RRR; no murmur Lungs:  Decreased breath sounds bilaterally; no wheezing Abd: soft, nontender, no hepatomegaly Ext: no edema Skin: warm and dry Neuro:  CNs 2-12 intact, no focal abnormalities noted  EKG:  Sinus brady, HR 55, normal axis, j point elevation, no significant ST-T changes     ASSESSMENT AND PLAN:  1. Left Ventricular Hypertrophy:   She had mild focal basal septal hypertrophy with dynamic obstruction with mid cavity obliteration and grade 1 diastolic dysfunction on echocardiogram. This may have contributed somewhat to her dyspnea. Lung issues are the primary cause of her dyspnea.  HR looks much better on current dose of metoprolol.  No further medication adjustments at this time.  2. Chest Pain:  She has evidence of CAD on chest CT with coronary calcifications.  Although, her symptoms are atypical.  Will arrange Dobutamine Myoview.  If low risk, continue medical Rx.  She remains on ASA and statin.  If myoview high risk, will need to consider further evaluation.    3. Hypertension:   Monitor.  May need to adjust Rx at follow up.    4. Carotid Stenosis:   Dopplers due in 02/2013.  5. COPD:   Managed by pulmonology.    6. Disposition:  Plan follow up with me in 2-3 weeks after her stress test.    Signed, Tereso Newcomer, PA-C  9:58 AM 07/23/2012

## 2012-08-06 ENCOUNTER — Ambulatory Visit (HOSPITAL_COMMUNITY): Payer: Medicare Other | Attending: Cardiovascular Disease | Admitting: Radiology

## 2012-08-06 ENCOUNTER — Other Ambulatory Visit (HOSPITAL_COMMUNITY): Payer: Self-pay | Admitting: *Deleted

## 2012-08-06 VITALS — BP 179/90 | HR 96 | Ht 61.0 in | Wt 123.0 lb

## 2012-08-06 DIAGNOSIS — I739 Peripheral vascular disease, unspecified: Secondary | ICD-10-CM | POA: Insufficient documentation

## 2012-08-06 DIAGNOSIS — R0609 Other forms of dyspnea: Secondary | ICD-10-CM | POA: Insufficient documentation

## 2012-08-06 DIAGNOSIS — R079 Chest pain, unspecified: Secondary | ICD-10-CM | POA: Insufficient documentation

## 2012-08-06 DIAGNOSIS — R0602 Shortness of breath: Secondary | ICD-10-CM

## 2012-08-06 DIAGNOSIS — R0989 Other specified symptoms and signs involving the circulatory and respiratory systems: Secondary | ICD-10-CM | POA: Insufficient documentation

## 2012-08-06 DIAGNOSIS — I10 Essential (primary) hypertension: Secondary | ICD-10-CM | POA: Insufficient documentation

## 2012-08-06 MED ORDER — TECHNETIUM TC 99M SESTAMIBI GENERIC - CARDIOLITE
33.0000 | Freq: Once | INTRAVENOUS | Status: AC | PRN
  Administered 2012-08-06: 33 via INTRAVENOUS

## 2012-08-06 MED ORDER — TECHNETIUM TC 99M SESTAMIBI GENERIC - CARDIOLITE
11.0000 | Freq: Once | INTRAVENOUS | Status: AC | PRN
  Administered 2012-08-06: 11 via INTRAVENOUS

## 2012-08-06 MED ORDER — SODIUM CHLORIDE 0.9 % IV SOLN: 20.0000 ug/kg | INTRAVENOUS | Status: DC

## 2012-08-06 MED ORDER — SODIUM CHLORIDE 0.9 % IV SOLN
20.0000 ug/kg | INTRAVENOUS | Status: DC
  Administered 2012-08-06: 20 ug/kg/min via INTRAVENOUS

## 2012-08-06 NOTE — Progress Notes (Signed)
[facility] SITE 3 NUCLEAR MED [address] [place], [place] [address] [phone]    Cardiology Nuclear Med Study  [name] is a 78 y.o. female     MRN : [id number]     DOB: 30-Jan-1935  Procedure Date: 08/06/2012  Nuclear Med Background Indication for Stress Test:  Evaluation for Ischemia History:  10/13 Echo:EF=75%, LVH; 10/13 OZ:HYQMVHQI calcifications; HCM Cardiac Risk Factors: Carotid Disease, Family History - CAD, History of Smoking, Hypertension, Lipids and PVD  Symptoms:  Chest Pain (last episode of chest discomfort was about 3-weeks ago), DOE and SOB   Nuclear Pre-Procedure Caffeine/Decaff Intake:  None > 12 hrs NPO After: 9:30pm   Lungs:  Clear. O2 Sat: 100% on 3 liters of O2. IV 0.9% NS with Angio Cath:  20g  IV Site: R Antecubital x 1, tolerated well IV Started by:  Irean Hong, RN  Chest Size (in):  38 Cup Size: B  Height: 5\' 1"  (1.549 m)  Weight:  123 lb (55.792 kg)  BMI:  Body mass index is 23.24 kg/(m^2). Tech Comments:  Lopressor held x 24 hours    Nuclear Med Study 1 or 2 day study: 1 day  Stress Test Type:  Dobutamine  Reading MD: Kristeen Miss, MD  Order Authorizing Provider:  Valera Castle, MD, and Tereso Newcomer, PA-C  Resting Radionuclide: Technetium 58m Sestamibi  Resting Radionuclide Dose: 11.0 mCi   Stress Radionuclide:  Technetium 37m Sestamibi  Stress Radionuclide Dose: 30.4 mCi           Stress Protocol Rest HR: 96 Stress HR: 164  Rest BP: 179/90 Stress BP: 174/84  Exercise Time (min): 5:01 METS: n/a   Predicted Max HR: 145 bpm % Max HR: 113.1 bpm Rate Pressure Product: 69629    Dose of Adenosine (mg):  n/a Dose of Lexiscan: 0.4 mg  Dose of Atropine (mg): n/a Dose of Dobutamine: n/a mcg/kg/min (at max HR)  Stress Test Technologist: Smiley Houseman, CMA-N  Nuclear Technologist:  Domenic Polite, CNMT     Rest Procedure:  Myocardial perfusion imaging was performed at rest 45 minutes following the intravenous  administration of Technetium 78m Sestamibi.  Rest ECG: NSR - Normal EKG  Stress Procedure:  The patient received IV dobutamine and no IV atropine.  She c/o chest pain, 7/10, with radiation to both arms during infusion. Technetium 20m Sestamibi was injected at peak heart rate and quantitative spect images were obtained after a 45 minute delay.  Stress ECG: No significant change from baseline ECG  QPS Raw Data Images:  Normal; no motion artifact; normal heart/lung ratio. Stress Images:  Normal homogeneous uptake in all areas of the myocardium. Rest Images:  Normal homogeneous uptake in all areas of the myocardium. Subtraction (SDS):  No evidence of ischemia. Transient Ischemic Dilatation (Normal <1.22):  1.06 Lung/Heart Ratio (Normal <0.45):  0.26  Quantitative Gated Spect Images QGS EDV:  42 ml QGS ESV:  6 ml  Impression Exercise Capacity:  Dobutamine study with no exercise. BP Response:  Normal blood pressure response. Clinical Symptoms:  Mild chest pain/dyspnea. ECG Impression:  No significant ST segment change suggestive of ischemia. Comparison with Prior Nuclear Study: No previous nuclear study performed  Overall Impression:  Normal stress nuclear study.  No evidence of ischemia.    LV Ejection Fraction: 85%.  LV Wall Motion:  NL LV Function; NL Wall Motion.    Vesta Mixer, Montez Hageman., MD, [facility] 08/06/2012, 5:01 PM Office - [phone] Pager [phone]

## 2012-08-07 ENCOUNTER — Ambulatory Visit (INDEPENDENT_AMBULATORY_CARE_PROVIDER_SITE_OTHER): Payer: Medicare Other | Admitting: Emergency Medicine

## 2012-08-07 ENCOUNTER — Encounter: Payer: Self-pay | Admitting: Emergency Medicine

## 2012-08-07 VITALS — BP 140/86 | HR 64 | Temp 97.7°F | Ht 61.0 in | Wt 123.0 lb

## 2012-08-07 DIAGNOSIS — R918 Other nonspecific abnormal finding of lung field: Secondary | ICD-10-CM

## 2012-08-07 DIAGNOSIS — J441 Chronic obstructive pulmonary disease with (acute) exacerbation: Secondary | ICD-10-CM

## 2012-08-07 NOTE — Assessment & Plan Note (Signed)
Apparently an AE, started herself on prednisone. Underscored with her that even thought the pred sounded correct, she needs to call us even on weekend so we know her status.  - finish the pred - no apparent indication for abx at this time/ - rov 1 month

## 2012-08-07 NOTE — Progress Notes (Signed)
Subjective:     Patient ID: [name], female   DOB: 12-12-1934, 78 y.o.   MRN: [id number]  HPI 78 yo woman, former smoker (50 pk-yrs), hx COPD, HTN and diastolic CHF, PVD. She was admitted for AE-COPD 10/7-10/11. CT scan during that admission showed scattered LL nodular disease. She returns for f/u, has been taking symbicort and spiriva. This was her 2nd hospitalization for AE. She is now feeling almost back to baseline, still with [employer] - hasn't been shopping by herself. Her FEV1 was 0.57L in 07/2010. Very little cough, sputum. She does occasionally wheeze.  She uses SABA several times a week.   ROV 08/07/12 -- Follows up for her severe COPD, associated hypoxemia, frequent exacerbations. Has been managed on Spiriva + Symbicort. O2 2L/min pulsed w exertion >> she increased to 3L/min on her own. Also LL pulm nodular dizease by CT scan, due for repeat April 2014. She began to have chest tightness, cough, chest burning over the weekend (4-5 days ago). She started an old script of pred on her own > a taper that she had from before. Her chest tightness and breathing have improved. Rare cough, clear mucous.      Objective:   Physical Exam Filed Vitals:   08/07/12 1126  BP: 140/86  Pulse: 64  Temp: 97.7 F (36.5 C)   Gen: Pleasant, elderly woman, in no distress,  normal affect  ENT: No lesions,  mouth clear,  oropharynx clear, no postnasal drip  Neck: No JVD, no TMG, no carotid bruits  Lungs: No use of accessory muscles, no dullness to percussion, clear without rales or rhonchi  Cardiovascular: RRR, heart sounds normal, no murmur or gallops, no peripheral edema  Abdomen: soft and NT, no HSM,  BS normal  Musculoskeletal: No deformities, no cyanosis or clubbing  Neuro: alert, non focal  Skin: Warm, no lesions or rashes    CT ANGIOGRAPHY CHEST 04/18/12 --  Technique: Multidetector CT imaging of the chest using the  standard protocol during bolus administration of intravenous    contrast. Multiplanar reconstructed images including MIPs were  obtained and reviewed to evaluate the vascular anatomy.  Contrast: OMNIPAQUE IOHEXOL 350 MG/ML SOLN  Comparison: Chest radiograph 04/18/2012.  Findings: No pulmonary embolus. No pathologically enlarged  mediastinal, hilar or axillary lymph nodes. Atherosclerotic  calcification of the arterial vasculature, including coronary  arteries. Heart size normal. Lipomatous hypertrophy of the  interatrial septum is incidentally noted. No pericardial effusion.  Biapical pleural parenchymal scarring. Emphysema. There are  scattered nodular lesions in the lungs, most prominent in the right  upper lobe, measuring up to 7 mm (6 x 8 mm). No pleural fluid.  Airway is unremarkable.  Incidental imaging of the upper abdomen shows no acute findings.  No worrisome lytic or sclerotic lesions.  IMPRESSION:  1. No pulmonary embolus.  2. Scattered bilateral pulmonary nodules, predominating in the  right upper lobe. As this patient is at increased risk for  bronchogenic carcinoma, follow-up is recommended in 3-6 months.      Assessment:     Multiple lung nodules She is for repeat Ct scan in April  COPD with exacerbation Apparently an AE, started herself on prednisone. Underscored with her that even thought the pred sounded correct, she needs to call us even on weekend so we know her status.  - finish the pred - no apparent indication for abx at this time/ - rov 1 month

## 2012-08-07 NOTE — Assessment & Plan Note (Signed)
She is for repeat Ct scan in April

## 2012-08-07 NOTE — Patient Instructions (Addendum)
Please continue your current medications Finish the prednisone taper until gone Wear your oxygen on 3L/min. We can discuss turning it down at your next visit Get your CT scan of the chest in April as planned Follow with Dr Delton Coombes in 1 month

## 2012-08-14 ENCOUNTER — Encounter: Payer: Self-pay | Admitting: Physician Assistant

## 2012-08-14 ENCOUNTER — Ambulatory Visit (INDEPENDENT_AMBULATORY_CARE_PROVIDER_SITE_OTHER): Payer: Medicare Other | Admitting: Physician Assistant

## 2012-08-14 ENCOUNTER — Ambulatory Visit (INDEPENDENT_AMBULATORY_CARE_PROVIDER_SITE_OTHER)
Admission: RE | Admit: 2012-08-14 | Discharge: 2012-08-14 | Disposition: A | Discharge status: Home / Self Care | Payer: Medicare Other | Source: Ambulatory Visit | Attending: Physician Assistant | Admitting: Physician Assistant

## 2012-08-14 VITALS — BP 142/82 | HR 60 | Ht 61.0 in | Wt 124.4 lb

## 2012-08-14 DIAGNOSIS — I10 Essential (primary) hypertension: Secondary | ICD-10-CM

## 2012-08-14 DIAGNOSIS — I5189 Other ill-defined heart diseases: Secondary | ICD-10-CM

## 2012-08-14 DIAGNOSIS — J441 Chronic obstructive pulmonary disease with (acute) exacerbation: Secondary | ICD-10-CM

## 2012-08-14 DIAGNOSIS — R079 Chest pain, unspecified: Secondary | ICD-10-CM

## 2012-08-14 MED ORDER — MOXIFLOXACIN HCL 400 MG PO TABS: 400.0000 mg | ORAL_TABLET | Freq: Every day | ORAL | Status: DC

## 2012-08-14 MED ORDER — PREDNISONE (PAK) 10 MG PO TABS: 10.0000 mg | ORAL_TABLET | ORAL | Status: DC

## 2012-08-14 NOTE — Progress Notes (Signed)
[address] [place], [place]  [address] Phone: [phone], Fax:  [phone]  Date:  08/14/2012   ID:  [name], DOB September 12, 1934, MRN [id number]  PCP:  Herb Grays, MD  Primary Cardiologist:  Dr. Valera Castle     History of Present Illness: [name] is a 78 y.o. female who returns for follow up.  She has a hx of PAD, HTN, COPD. She's had a question of infrarenal abdominal aorta dilatation on CT in the past. Subsequent ultrasounds have been negative for aneurysm. She last saw Dr. Daleen Squibb 8/12 after undergoing a "Life Screening Survey". This demonstrated mild carotid artery stenosis bilaterally (no percentage given) and mildly reduced ABIs bilaterally (0.9). Recommendation was to repeat her carotid Dopplers in 02/2013. Admitted 04/2012 with a COPD exacerbation. She was followed by pulmonary/critical care. Chest CT was negative for pulmonary embolism. She did have multiple lung nodules noted and needs followup CT in 3-6 months. Echocardiogram 04/16/12: Mild focal basal septal hypertrophy, EF 70-75% with dynamic obstruction with mid cavity obliteration, grade 1 diastolic dysfunction, MAC, atrial septal thickening with findings consistent with lipomatous hypertrophy.  I saw her in followup 05/09/12. I titrated her beta blocker given her hypertrophic cardiomyopathy to slow her HR some. She remains on chronic O2. Chronic dyspnea is essentially unchanged.   I last saw her 07/23/12 and she complained of chest pain.  Of note, she had coronary calcifications on chest CT in 04/2012.  Dobutamine Myoview 08/06/12:  No ischemia, EF 85%.  Today, she notes worsening cough and congestion.  Sputum is yellow.  No hemoptysis.  No fever.  She notes worsening dyspnea as well.  Remains on O2. She has chest tightness that is better with SABA and cough.  No wheezing.    Labs (10/13): K 4.5, creatinine 0.76, ALT 16, Hgb 14  R LE ultrasound 10/13: Negative for DVT  Wt Readings from Last 3  Encounters:  08/07/12 123 lb (55.792 kg)  08/06/12 123 lb (55.792 kg)  07/23/12 127 lb (57.607 kg)     Past Medical History  Diagnosis Date  . PVD (peripheral vascular disease)   . Actinomycosis, cervicofacial   . Emphysema   . COPD (chronic obstructive pulmonary disease)   . HTN (hypertension)   . Hx of echocardiogram     Echocardiogram 04/16/12: Mild focal basal septal hypertrophy, EF 70-75% with dynamic obstruction with mid cavity obliteration, grade 1 diastolic dysfunction, MAC, atrial septal thickening with findings consistent with lipomatous hypertrophy  . Hyperlipidemia   . CAD (coronary artery disease)     a. coronary calcification on prior Chest CT;  b. Dob MV 1/14:  EF 85%, no ischemia    Current Outpatient Prescriptions  Medication Sig Dispense Refill  . albuterol (PROVENTIL HFA;VENTOLIN HFA) 108 (90 BASE) MCG/ACT inhaler Inhale 2 puffs into the lungs every 6 (six) hours as needed. For shortness of breath      . aspirin 81 MG tablet Take 81 mg by mouth daily.        Marland Kitchen atorvastatin (LIPITOR) 10 MG tablet Take 10 mg by mouth daily.        . budesonide-formoterol (SYMBICORT) 160-4.5 MCG/ACT inhaler Inhale 2 puffs into the lungs 2 (two) times daily.        . calcium-vitamin D (OSCAL WITH D) 500-200 MG-UNIT per tablet Take 1 tablet by mouth 2 (two) times daily.        . famotidine (PEPCID) 20 MG tablet Take 1 tablet 2 times  daily for 4 weeks then as needed  60 tablet  2  . metoprolol tartrate (LOPRESSOR) 25 MG tablet Take 1 tablet (25 mg total) by mouth 2 (two) times daily.  180 tablet  3  . Multiple Vitamin (MULTIVITAMIN) tablet Take 1 tablet by mouth daily.        Marland Kitchen tiotropium (SPIRIVA) 18 MCG inhalation capsule Place 18 mcg into inhaler and inhale daily.         No current facility-administered medications for this visit.   Facility-Administered Medications Ordered in Other Visits  Medication Dose Route Frequency Provider Last Rate Last Dose  . DOBUTamine (DOBUTREX) 1,000  mcg/mL in sodium chloride 0.9 % 150 mL infusion  20 mcg/kg Intravenous Continuous Gaylord Shih, MD        Allergies:    Allergies  Allergen Reactions  . Penicillins Hives    Has taken it since.  . Pneumovax (Pneumococcal Polysaccharides) Hives    Social History:  The patient  reports that she quit smoking about 7 years ago. Her smoking use included Cigarettes. She has a 49 pack-year smoking history. She has never used smokeless tobacco. She reports that she does not drink alcohol or use illicit drugs.   ROS:  Please see the history of present illness.    All other systems reviewed and negative.   PHYSICAL EXAM: VS:  BP 142/82  Pulse 60  Ht 5\' 1"  (1.549 m)  Wt 124 lb 6.4 oz (56.427 kg)  BMI 23.50 kg/m2 Well nourished, well developed, in no acute distress HEENT: normal Neck: no JVD at 90 degrees  Cardiac:  distant S1, S2; RRR; no murmur Lungs:  Decreased breath sounds at bases bilaterally, no wheezing, rhonchi or rales Abd: soft, nontender, no hepatomegaly Ext: no edema Skin: warm and dry Neuro:  CNs 2-12 intact, no focal abnormalities noted  EKG:  NSR, HR 60, normal axis.     ASSESSMENT AND PLAN:  1. COPD Exacerbation:  She has increased dyspnea and sputum production.  Add Avelox 400 mg QD x 5 days.  Add Prednisone taper over 10 days.  Check CXR.  Arrange early follow up with Dr. Delton Coombes or Rubye Oaks, NP. 2. Chest Pain:  Low risk myoview.  Likely symptoms are related to COPD.  No further workup.  She did have coronary calcification on CT in past.  Continue ASA and statin. 3. Hypertension:  At goal according to JNC 8.  Continue current rx. Continue beta blocker given mild focal basal septal hypertrophy with dynamic obstruction on echo. 4. Disposition:  F/u with Dr. Valera Castle in 6 mos.  SignedTereso Newcomer, PA-C  10:06 AM 08/14/2012

## 2012-08-14 NOTE — Patient Instructions (Addendum)
Your physician wants you to follow-up in: 6 MONTHS WITH DR. WALL. You will receive a reminder letter in the mail two months in advance. If you don't receive a letter, please call our office to schedule the follow-up appointment.  YOU ARE TO GO TO [facility] ON [address] TODAY FOR A CHEST XRAY; BRONCHITIS  PRESCRIPTIONS FOR AVELOX 400 MG DAILY X 5 DAYS AND PREDNISONE 10 MG WITH INSTRUCTIONS HAVE BEEN CALLED IN TODAY  YOU HAVE A FOLLOW UP APPT WITH DR. Bascom Levels PARRETT 08/17/12 @ 10 AM

## 2012-08-15 ENCOUNTER — Telehealth: Payer: Self-pay | Admitting: *Deleted

## 2012-08-15 NOTE — Telephone Encounter (Signed)
Message copied by Tarri Fuller on Wed Aug 15, 2012  6:18 PM ------      Message from: [place], [place] T      Created: Wed Aug 15, 2012  1:58 PM       chest X-ray ok      Continue with current treatment plan.      Tereso Newcomer, PA-C  1:58 PM 08/15/2012

## 2012-08-15 NOTE — Telephone Encounter (Signed)
pt notified about cxr results with verbal understanding today

## 2012-08-17 ENCOUNTER — Ambulatory Visit (INDEPENDENT_AMBULATORY_CARE_PROVIDER_SITE_OTHER): Payer: Medicare Other | Admitting: Adult Health

## 2012-08-17 VITALS — BP 140/90 | HR 60 | Temp 97.5°F | Ht 61.0 in | Wt 126.0 lb

## 2012-08-17 DIAGNOSIS — J441 Chronic obstructive pulmonary disease with (acute) exacerbation: Secondary | ICD-10-CM

## 2012-08-17 MED ORDER — TIOTROPIUM BROMIDE MONOHYDRATE 18 MCG IN CAPS: 18.0000 ug | ORAL_CAPSULE | Freq: Every day | RESPIRATORY_TRACT | Status: DC

## 2012-08-17 NOTE — Patient Instructions (Addendum)
Finish Avelox and prednisone as planned. Follow Dr. Delton Coombes as planned and as needed. Please contact office for sooner follow up if symptoms do not improve or worsen or seek emergency care

## 2012-08-20 NOTE — Assessment & Plan Note (Signed)
Resolving Exacerbation  cXR w/ no acute process .   Plan  Finish Avelox and prednisone as planned. Follow Dr. Delton Coombes as planned and as needed. Please contact office for sooner follow up if symptoms do not improve or worsen or seek emergency care

## 2012-08-20 NOTE — Progress Notes (Signed)
Subjective:     Patient ID: [name], female   DOB: 1934/08/22, 78 y.o.   MRN: [id number]  HPI 78 yo woman, former smoker (50 pk-yrs), hx COPD, HTN and diastolic CHF, PVD. She was admitted for AE-COPD 10/7-10/11. CT scan during that admission showed scattered LL nodular disease. She returns for f/u, has been taking symbicort and spiriva. This was her 2nd hospitalization for AE. She is now feeling almost back to baseline, still with [employer] - hasn't been shopping by herself. Her FEV1 was 0.57L in 07/2010. Very little cough, sputum. She does occasionally wheeze.  She uses SABA several times a week.   ROV 08/07/12 -- Follows up for her severe COPD, associated hypoxemia, frequent exacerbations. Has been managed on Spiriva + Symbicort. O2 2L/min pulsed w exertion >> she increased to 3L/min on her own. Also LL pulm nodular dizease by CT scan, due for repeat April 2014. She began to have chest tightness, cough, chest burning over the weekend (4-5 days ago). She started an old script of pred on her own > a taper that she had from before. Her chest tightness and breathing have improved. Rare cough, clear mucous.   08/17/12 Follow up  Present for a follow up for COPD flare .  Pt saw Cardiology on Tuesday-2/4  and they ordered avelox and prednisone. Pt states  cough is improved, but she is still having  increased SOB with rest and minimal activity. She denies any hemoptysis, orthopnea, PND, or leg swelling Chest x-ray is without acute process.   ROS:  Constitutional:   No  weight loss, night sweats,  Fevers, chills,  +atigue, or  lassitude.  HEENT:   No headaches,  Difficulty swallowing,  Tooth/dental problems, or  Sore throat,                No sneezing, itching, ear ache, nasal congestion, post nasal drip,   CV:  No chest pain,  Orthopnea, PND, swelling in lower extremities, anasarca, dizziness, palpitations, syncope.   GI  No heartburn, indigestion, abdominal pain, nausea, vomiting, diarrhea, change in  bowel habits, loss of appetite, bloody stools.   Resp:   No coughing up of blood.  Marland Kitchen  No chest wall deformity  Skin: no rash or lesions.  GU: no dysuria, change in color of urine, no urgency or frequency.  No flank pain, no hematuria   MS:  No joint pain or swelling.  No decreased range of motion.  No back pain.  Psych:  No change in mood or affect. No depression or anxiety.  No memory loss.          Objective:   Physical Exam Filed Vitals:   08/17/12 1007  BP: 140/90  Pulse: 60  Temp: 97.5 F (36.4 C)   Gen: Pleasant, elderly woman, in no distress,  normal affect  ENT: No lesions,  mouth clear,  oropharynx clear, no postnasal drip  Neck: No JVD, no TMG, no carotid bruits  Lungs: No use of accessory muscles, no dullness to percussion, diminished BS in bases   Cardiovascular: RRR, heart sounds normal, no murmur or gallops, no peripheral edema  Abdomen: soft and NT, no HSM,  BS normal  Musculoskeletal: No deformities, no cyanosis or clubbing  Neuro: alert, non focal  Skin: Warm, no lesions or rashes    CT ANGIOGRAPHY CHEST 04/18/12 --  Technique: Multidetector CT imaging of the chest using the  standard protocol during bolus administration of intravenous  contrast. Multiplanar reconstructed images including  MIPs were  obtained and reviewed to evaluate the vascular anatomy.  Contrast: OMNIPAQUE IOHEXOL 350 MG/ML SOLN  Comparison: Chest radiograph 04/18/2012.  Findings: No pulmonary embolus. No pathologically enlarged  mediastinal, hilar or axillary lymph nodes. Atherosclerotic  calcification of the arterial vasculature, including coronary  arteries. Heart size normal. Lipomatous hypertrophy of the  interatrial septum is incidentally noted. No pericardial effusion.  Biapical pleural parenchymal scarring. Emphysema. There are  scattered nodular lesions in the lungs, most prominent in the right  upper lobe, measuring up to 7 mm (6 x 8 mm). No pleural fluid.   Airway is unremarkable.  Incidental imaging of the upper abdomen shows no acute findings.  No worrisome lytic or sclerotic lesions.  IMPRESSION:  1. No pulmonary embolus.  2. Scattered bilateral pulmonary nodules, predominating in the  right upper lobe. As this patient is at increased risk for  bronchogenic carcinoma, follow-up is recommended in 3-6 months.      Assessment:     No problem-specific assessment & plan notes found for this encounter.

## 2012-09-13 ENCOUNTER — Ambulatory Visit (INDEPENDENT_AMBULATORY_CARE_PROVIDER_SITE_OTHER): Payer: Medicare Other | Admitting: Emergency Medicine

## 2012-09-13 ENCOUNTER — Encounter: Payer: Self-pay | Admitting: Emergency Medicine

## 2012-09-13 VITALS — BP 130/72 | HR 75 | Temp 97.0°F | Ht 61.0 in | Wt 127.0 lb

## 2012-09-13 DIAGNOSIS — R918 Other nonspecific abnormal finding of lung field: Secondary | ICD-10-CM

## 2012-09-13 DIAGNOSIS — J449 Chronic obstructive pulmonary disease, unspecified: Secondary | ICD-10-CM

## 2012-09-13 NOTE — Patient Instructions (Addendum)
Please continue your current medications We will refer you to pulmonary rehab Get your CT scan in April as planned Follow with Dr Delton Coombes in April after the CT scan to review.

## 2012-09-13 NOTE — Progress Notes (Signed)
Subjective:     Patient ID: [name], female   DOB: 06-07-1935, 78 y.o.   MRN: [id number]  HPI 78 yo woman, former smoker (50 pk-yrs), hx COPD, HTN and diastolic CHF, PVD. She was admitted for AE-COPD 10/7-10/11. CT scan during that admission showed scattered LL nodular disease. She returns for f/u, has been taking symbicort and spiriva. This was her 2nd hospitalization for AE. She is now feeling almost back to baseline, still with [employer] - hasn't been shopping by herself. Her FEV1 was 0.57L in 07/2010. Very little cough, sputum. She does occasionally wheeze.  She uses SABA several times a week.   ROV 08/07/12 -- Follows up for her severe COPD, associated hypoxemia, frequent exacerbations. Has been managed on Spiriva + Symbicort. O2 2L/min pulsed w exertion >> she increased to 3L/min on her own. Also LL pulm nodular dizease by CT scan, due for repeat April 2014. She began to have chest tightness, cough, chest burning over the weekend (4-5 days ago). She started an old script of pred on her own > a taper that she had from before. Her chest tightness and breathing have improved. Rare cough, clear mucous.   08/17/12 Follow up  Present for a follow up for COPD flare .  Pt saw Cardiology on Tuesday-2/4  and they ordered avelox and prednisone. Pt states  cough is improved, but she is still having  increased SOB with rest and minimal activity. She denies any hemoptysis, orthopnea, PND, or leg swelling Chest x-ray is without acute process.  ROV 09/13/12 -- Follows up for her severe COPD, associated hypoxemia, frequent exacerbations. Was treated for an AE-COPD last month with avelox and pred taper. She is feeling better - back to baseline. Wearing o2 at 2-3L/min. Using a walker to ambulate. No wheeze or sputum. Using SABA 2-3x a day.      Objective:   Physical Exam Filed Vitals:   09/13/12 1411  BP: 130/72  Pulse: 75  Temp: 97 F (36.1 C)   Gen: Pleasant, elderly woman, in no distress,  normal  affect  ENT: No lesions,  mouth clear,  oropharynx clear, no postnasal drip  Neck: No JVD, no TMG, no carotid bruits  Lungs: No use of accessory muscles, no dullness to percussion, diminished BS in bases   Cardiovascular: RRR, heart sounds normal, no murmur or gallops, no peripheral edema  Abdomen: soft and NT, no HSM,  BS normal  Musculoskeletal: No deformities, no cyanosis or clubbing  Neuro: alert, non focal  Skin: Warm, no lesions or rashes    CT ANGIOGRAPHY CHEST 04/18/12 --  Technique: Multidetector CT imaging of the chest using the  standard protocol during bolus administration of intravenous  contrast. Multiplanar reconstructed images including MIPs were  obtained and reviewed to evaluate the vascular anatomy.  Contrast: OMNIPAQUE IOHEXOL 350 MG/ML SOLN  Comparison: Chest radiograph 04/18/2012.  Findings: No pulmonary embolus. No pathologically enlarged  mediastinal, hilar or axillary lymph nodes. Atherosclerotic  calcification of the arterial vasculature, including coronary  arteries. Heart size normal. Lipomatous hypertrophy of the  interatrial septum is incidentally noted. No pericardial effusion.  Biapical pleural parenchymal scarring. Emphysema. There are  scattered nodular lesions in the lungs, most prominent in the right  upper lobe, measuring up to 7 mm (6 x 8 mm). No pleural fluid.  Airway is unremarkable.  Incidental imaging of the upper abdomen shows no acute findings.  No worrisome lytic or sclerotic lesions.  IMPRESSION:  1. No pulmonary embolus.  2. Scattered bilateral pulmonary nodules, predominating in the  right upper lobe. As this patient is at increased risk for  bronchogenic carcinoma, follow-up is recommended in 3-6 months.      Assessment:     Multiple lung nodules Due for f/u in April '14, then OV to review  COPD UNSPECIFIED - contineu spiriva and symbicort - O2  - referral to pulm rehab - rov in April

## 2012-09-13 NOTE — Assessment & Plan Note (Signed)
Due for f/u in April '14, then OV to review

## 2012-09-13 NOTE — Assessment & Plan Note (Signed)
-   contineu spiriva and symbicort - O2  - referral to pulm rehab - rov in April

## 2012-10-12 ENCOUNTER — Ambulatory Visit (INDEPENDENT_AMBULATORY_CARE_PROVIDER_SITE_OTHER)
Admission: RE | Admit: 2012-10-12 | Discharge: 2012-10-12 | Disposition: A | Discharge status: Home / Self Care | Payer: Medicare Other | Source: Ambulatory Visit | Attending: Emergency Medicine | Admitting: Emergency Medicine

## 2012-10-12 DIAGNOSIS — R918 Other nonspecific abnormal finding of lung field: Secondary | ICD-10-CM

## 2012-11-08 ENCOUNTER — Ambulatory Visit (INDEPENDENT_AMBULATORY_CARE_PROVIDER_SITE_OTHER): Payer: Medicare Other | Admitting: Emergency Medicine

## 2012-11-08 ENCOUNTER — Encounter: Payer: Self-pay | Admitting: Emergency Medicine

## 2012-11-08 VITALS — BP 130/70 | HR 84 | Temp 97.2°F | Ht 61.0 in | Wt 129.2 lb

## 2012-11-08 DIAGNOSIS — R918 Other nonspecific abnormal finding of lung field: Secondary | ICD-10-CM

## 2012-11-08 DIAGNOSIS — J449 Chronic obstructive pulmonary disease, unspecified: Secondary | ICD-10-CM

## 2012-11-08 NOTE — Assessment & Plan Note (Signed)
Discussed her meds. Advised her to avoid using extra doses of symbicort, which she occasionally does.  - symbicort bid + spiriva qd - rov 3 months

## 2012-11-08 NOTE — Progress Notes (Signed)
Subjective:     Patient ID: [name], female   DOB: October 10, 1934, 78 y.o.   MRN: [id number]  HPI 78 yo woman, former smoker (50 pk-yrs), hx COPD, HTN and diastolic CHF, PVD. She was admitted for AE-COPD 10/7-10/11. CT scan during that admission showed scattered LL nodular disease. She returns for f/u, has been taking symbicort and spiriva. This was her 2nd hospitalization for AE. She is now feeling almost back to baseline, still with [employer] - hasn't been shopping by herself. Her FEV1 was 0.57L in 07/2010. Very little cough, sputum. She does occasionally wheeze.  She uses SABA several times a week.   ROV 08/07/12 -- Follows up for her severe COPD, associated hypoxemia, frequent exacerbations. Has been managed on Spiriva + Symbicort. O2 2L/min pulsed w exertion >> she increased to 3L/min on her own. Also LL pulm nodular dizease by CT scan, due for repeat April 2014. She began to have chest tightness, cough, chest burning over the weekend (4-5 days ago). She started an old script of pred on her own > a taper that she had from before. Her chest tightness and breathing have improved. Rare cough, clear mucous.   08/17/12 Follow up  Present for a follow up for COPD flare .  Pt saw Cardiology on Tuesday-2/4  and they ordered avelox and prednisone. Pt states  cough is improved, but she is still having  increased SOB with rest and minimal activity. She denies any hemoptysis, orthopnea, PND, or leg swelling Chest x-ray is without acute process.  ROV 09/13/12 -- Follows up for her severe COPD, associated hypoxemia, frequent exacerbations. Was treated for an AE-COPD last month with avelox and pred taper. She is feeling better - back to baseline. Wearing o2 at 2-3L/min. Using a walker to ambulate. No wheeze or sputum. Using SABA 2-3x a day.   ROV 11/08/12 -- severe COPD, associated hypoxemia, frequent exacerbations. Also with scattered B pulm nodules identified on CT scan 04/2012 predominantly RUL. She returns today after  CT scan 4/4 >> stable. She is asking about the coronary plaque seen on the CT scan. She denies CP.  She is stable on spiriva + symbicort. No exacerbations. No new sx     Objective:   Physical Exam Filed Vitals:   11/08/12 1507  BP: 130/70  Pulse: 84  Temp: 97.2 F (36.2 C)   Gen: Pleasant, elderly woman, in no distress,  normal affect  ENT: No lesions,  mouth clear,  oropharynx clear, no postnasal drip  Neck: No JVD, no TMG, no carotid bruits  Lungs: No use of accessory muscles, distant, diminished BS in bases, no wheeze  Cardiovascular: RRR, heart sounds normal, no murmur or gallops, no peripheral edema  Musculoskeletal: No deformities, no cyanosis or clubbing  Neuro: alert, non focal  Skin: Warm, no lesions or rashes      Assessment:     Multiple lung nodules Stable on CT scan after 6 months. Planning to follow for at least 2 years. Next scan in 10/13  COPD UNSPECIFIED Discussed her meds. Advised her to avoid using extra doses of symbicort, which she occasionally does.  - symbicort bid + spiriva qd - rov 3 months

## 2012-11-08 NOTE — Assessment & Plan Note (Signed)
Stable on CT scan after 6 months. Planning to follow for at least 2 years. Next scan in 10/13

## 2012-11-08 NOTE — Patient Instructions (Addendum)
Your CT scan of the chest is stable compared with 04/2012 We will repeat your CT scan of the chest in October 2014 Please continue your Spiriva daily and Symbicort twice a day Don't use extra Symbicort Use albuterol 2 puffs up to every 4 hours if needed for shortness of breath Follow with Dr Delton Coombes in 3 months or sooner if you have any problems.

## 2012-12-05 ENCOUNTER — Telehealth: Payer: Self-pay | Admitting: Emergency Medicine

## 2012-12-05 MED ORDER — TIOTROPIUM BROMIDE MONOHYDRATE 18 MCG IN CAPS: 18.0000 ug | ORAL_CAPSULE | Freq: Every day | RESPIRATORY_TRACT | Status: DC

## 2012-12-05 MED ORDER — BUDESONIDE-FORMOTEROL FUMARATE 160-4.5 MCG/ACT IN AERO: 2.0000 | INHALATION_SPRAY | Freq: Two times a day (BID) | RESPIRATORY_TRACT | Status: DC

## 2012-12-05 NOTE — Telephone Encounter (Signed)
Called, spoke with pt. Verified she would like spiriva and symbicort 160 samples. I placed  Sample of each at the front for pick up. Pt aware, was very appreciative, and voiced no further questions or concerns at this time.

## 2013-01-14 ENCOUNTER — Encounter (HOSPITAL_COMMUNITY): Payer: Self-pay

## 2013-01-14 ENCOUNTER — Encounter (HOSPITAL_COMMUNITY)
Admission: RE | Admit: 2013-01-14 | Discharge: 2013-01-14 | Disposition: A | Discharge status: Home / Self Care | Payer: Medicare Other | Source: Ambulatory Visit | Attending: Emergency Medicine | Admitting: Emergency Medicine

## 2013-01-14 DIAGNOSIS — Z5189 Encounter for other specified aftercare: Secondary | ICD-10-CM | POA: Insufficient documentation

## 2013-01-14 DIAGNOSIS — I10 Essential (primary) hypertension: Secondary | ICD-10-CM | POA: Insufficient documentation

## 2013-01-14 DIAGNOSIS — I739 Peripheral vascular disease, unspecified: Secondary | ICD-10-CM | POA: Insufficient documentation

## 2013-01-14 DIAGNOSIS — E785 Hyperlipidemia, unspecified: Secondary | ICD-10-CM | POA: Insufficient documentation

## 2013-01-14 DIAGNOSIS — J441 Chronic obstructive pulmonary disease with (acute) exacerbation: Secondary | ICD-10-CM | POA: Insufficient documentation

## 2013-01-14 DIAGNOSIS — Z7982 Long term (current) use of aspirin: Secondary | ICD-10-CM | POA: Insufficient documentation

## 2013-01-14 DIAGNOSIS — Z79899 Other long term (current) drug therapy: Secondary | ICD-10-CM | POA: Insufficient documentation

## 2013-01-14 NOTE — Progress Notes (Signed)
[name] arrived today for orientation to Pulmonary Rehab.  She is accompanied by her husband.  She was transported to the department by wheelchair by [employer]. She is wearing portable oxygen @ 2L/M pulsed.  Neatly dressed, very pleasant, appears nervous.  States she has some reservations about coming to program.  Program definition, guidelines were reviewed with her and we will continue to encourage and support her.  She is oriented to time and place, grip strengths equal, denies any orthopedic problems or pain today.  She does state she has been having a full feeling in her chest during the night, this has been discussed with her primary MD and her Cardiology PA. She was started on Prevacid.  Encouraged to follow up if this changes in time, intensity or is persistent.  Reviewed 911 protocol due to family history.  She has seen on her chart that she has PVD, unsure of that diagnosis.  She has faint distal pulses, no edema present.  She denies any burning or cramping with ambulation. Heart rate regular, breath sounds normal.  We reviewed again the department expectations and set obtainable goals. Demonstration and practice of PLB using pulse oximeter.  Patient able to return demonstration satisfactorily. Safety and hand hygiene in the exercise area reviewed with patient.  Patient voices understanding. Her husband left the room for a short while and she was much less nervous, discussed regaining some of her independence to go shopping, getting her hair done and other personal activities.  Would like to be able to drive again.  We will support her in achieving her goals.  Her quality of life is altered due to her dependence on her husband since progression of her illness.  He is supportive and caring.  She does exhibit positive coping skill and is more positive at this point in her evaluation about attending.  2:15 p, to 2:45 pm   Cathie Olden RN

## 2013-01-15 ENCOUNTER — Encounter (HOSPITAL_COMMUNITY)
Admission: RE | Admit: 2013-01-15 | Discharge: 2013-01-15 | Disposition: A | Discharge status: Home / Self Care | Payer: Medicare Other | Source: Ambulatory Visit | Attending: Emergency Medicine | Admitting: Emergency Medicine

## 2013-01-17 ENCOUNTER — Ambulatory Visit (INDEPENDENT_AMBULATORY_CARE_PROVIDER_SITE_OTHER): Payer: Medicare Other | Admitting: Cardiology

## 2013-01-17 ENCOUNTER — Encounter: Payer: Self-pay | Admitting: Cardiology

## 2013-01-17 VITALS — BP 126/68 | HR 75 | Ht 61.0 in | Wt 128.0 lb

## 2013-01-17 DIAGNOSIS — I10 Essential (primary) hypertension: Secondary | ICD-10-CM

## 2013-01-17 DIAGNOSIS — I5189 Other ill-defined heart diseases: Secondary | ICD-10-CM

## 2013-01-17 DIAGNOSIS — I2510 Atherosclerotic heart disease of native coronary artery without angina pectoris: Secondary | ICD-10-CM

## 2013-01-17 DIAGNOSIS — J4489 Other specified chronic obstructive pulmonary disease: Secondary | ICD-10-CM

## 2013-01-17 DIAGNOSIS — I779 Disorder of arteries and arterioles, unspecified: Secondary | ICD-10-CM

## 2013-01-17 DIAGNOSIS — I739 Peripheral vascular disease, unspecified: Secondary | ICD-10-CM

## 2013-01-17 DIAGNOSIS — J449 Chronic obstructive pulmonary disease, unspecified: Secondary | ICD-10-CM

## 2013-01-17 NOTE — Progress Notes (Signed)
HPI [name] returns today to followup on her chest discomfort and coronary calcification found on CT scan. Stress Myoview was completely normal with ejection fraction of 75%. Her biggest limitation is O2 dependent COPD. She is getting rate to get involved in pulmonary rehabilitation which I strongly encouraged to complete. She also has peripheral vascular disease and history of carotid disease. She's relatively asymptomatic from this except for her purple toes.  She quit smoking 6 years ago. Medications reviewed and she is compliant.  She has pulmonary nodules that need followup on CT scan with pulmonary team. She is aware of this. She denies hemoptysis.  Past Medical History  Diagnosis Date  . PVD (peripheral vascular disease)   . Actinomycosis, cervicofacial   . Emphysema   . COPD (chronic obstructive pulmonary disease)   . HTN (hypertension)   . Hx of echocardiogram     Echocardiogram 04/16/12: Mild focal basal septal hypertrophy, EF 70-75% with dynamic obstruction with mid cavity obliteration, grade 1 diastolic dysfunction, MAC, atrial septal thickening with findings consistent with lipomatous hypertrophy  . Hyperlipidemia   . CAD (coronary artery disease)     a. coronary calcification on prior Chest CT;  b. Dob MV 1/14:  EF 85%, no ischemia    Current Outpatient Prescriptions  Medication Sig Dispense Refill  . albuterol (PROVENTIL HFA;VENTOLIN HFA) 108 (90 BASE) MCG/ACT inhaler Inhale 2 puffs into the lungs every 6 (six) hours as needed. For shortness of breath      . aspirin 81 MG tablet Take 81 mg by mouth daily.        Marland Kitchen atorvastatin (LIPITOR) 10 MG tablet Take 10 mg by mouth daily.        . budesonide-formoterol (SYMBICORT) 160-4.5 MCG/ACT inhaler Inhale 2 puffs into the lungs 2 (two) times daily.  1 Inhaler  0  . calcium-vitamin D (OSCAL WITH D) 500-200 MG-UNIT per tablet Take 1 tablet by mouth 2 (two) times daily.        . famotidine (PEPCID) 20 MG tablet Take 1 tablet 2  times daily for 4 weeks then as needed  60 tablet  2  . metoprolol tartrate (LOPRESSOR) 25 MG tablet Take 1 tablet (25 mg total) by mouth 2 (two) times daily.  180 tablet  3  . Multiple Vitamin (MULTIVITAMIN) tablet Take 1 tablet by mouth daily.        Marland Kitchen tiotropium (SPIRIVA) 18 MCG inhalation capsule Place 1 capsule (18 mcg total) into inhaler and inhale daily.  10 capsule  0  . vitamin C (ASCORBIC ACID) 500 MG tablet Take 500 mg by mouth 2 (two) times daily.       No current facility-administered medications for this visit.    Allergies  Allergen Reactions  . Penicillins Hives    Has taken it since.  . Pneumovax (Pneumococcal Polysaccharides) Hives    Family History  Problem Relation Age of Onset  . Coronary artery disease      family hx of female 1st. degree relative <50/family hx of 1st. degree relative <60  . Emphysema Father   . Heart disease Father   . Stomach cancer Father   . Emphysema      3 sisters. they were smokers  . Stomach cancer Sister   . Emphysema Sister   . Heart disease Mother   . Coronary artery disease Brother     History   Social History  . Marital Status: Married    Spouse Name: N/A    Number of  Children: 4  . Years of Education: N/A   Occupational History  . retired     [employer]   Social History Main Topics  . Smoking status: Former Smoker -- 1.00 packs/day for 49 years    Types: Cigarettes    Quit date: 07/11/2005  . Smokeless tobacco: Never Used     Comment: smoked for 48 years 1 ppd  . Alcohol Use: No  . Drug Use: No  . Sexually Active: Not on file   Other Topics Concern  . Not on file   Social History Narrative  . No narrative on file    ROS ALL NEGATIVE EXCEPT THOSE NOTED IN HPI  PE  General Appearance: well developed, well nourished in no acute distress, looks older than stated age, wearing O2 HEENT: symmetrical face, PERRLA, good dentition  Neck: no JVD, thyromegaly, or adenopathy, trachea midline Chest: symmetric  without deformity Cardiac: PMI non-displaced, RRR, normal S1, S2, no gallop, soft systolic murmur upper sternal border. Lung: clear to ausculation and percussion Vascular: Posterior tibials 1+ over 4+, dramatically reduced capillary refill, purple toes Abdominal: nondistended, nontender, good bowel sounds, no HSM, no bruits Extremities: no cyanosis, clubbing or edema, no sign of DVT, no varicosities  Skin: normal color, no rashes Neuro: alert and oriented x 3, non-focal Pysch: normal affect  EKG  BMET    Component Value Date/Time   NA 134* 04/20/2012 0605   K 4.5 04/20/2012 0605   CL 91* 04/20/2012 0605   CO2 32 04/20/2012 0605   GLUCOSE 120* 04/20/2012 0605   BUN 20 04/20/2012 0605   CREATININE 0.76 04/20/2012 0605   CALCIUM 9.9 04/20/2012 0605   GFRNONAA 81* 04/20/2012 0605   GFRAA >90 04/20/2012 0605    Lipid Panel     Component Value Date/Time   CHOL 213 12/16/2008   HDL 62 12/16/2008   LDLCALC 409 12/16/2008    CBC    Component Value Date/Time   WBC 11.2* 04/19/2012 0615   RBC 4.36 04/19/2012 0615   HGB 14.0 04/19/2012 0615   HCT 40.5 04/19/2012 0615   PLT 349 04/19/2012 0615   MCV 92.9 04/19/2012 0615   MCH 32.1 04/19/2012 0615   MCHC 34.6 04/19/2012 0615   RDW 12.5 04/19/2012 0615   LYMPHSABS 3.0 04/16/2012 1523   MONOABS 1.3* 04/16/2012 1523   EOSABS 0.1 04/16/2012 1523   BASOSABS 0.0 04/16/2012 1523

## 2013-01-17 NOTE — Assessment & Plan Note (Signed)
Stable and asymptomatic. No change in current treatment. Return the office in one year to see Dr. Delton See.

## 2013-01-17 NOTE — Assessment & Plan Note (Signed)
This is her major limitation and she is O2 dependent. I've advised her to participate andcomplete pulmonary rehabilitation.

## 2013-01-17 NOTE — Assessment & Plan Note (Signed)
Nonobstructive. Continue secondary preventative therapy.

## 2013-01-17 NOTE — Assessment & Plan Note (Signed)
Coronary calcifications seen on CT scan. Nuclear stress study showed no obstructive disease. Patient educated on this. No change in medical therapy.

## 2013-01-17 NOTE — Patient Instructions (Addendum)
Your physician wants you to follow-up in: 1 year with Dr. Nelson You will receive a reminder letter in the mail two months in advance. If you don't receive a letter, please call our office to schedule the follow-up appointment.   Your physician recommends that you continue on your current medications as directed. Please refer to the Current Medication list given to you today.  

## 2013-01-22 ENCOUNTER — Encounter (HOSPITAL_COMMUNITY)
Admission: RE | Admit: 2013-01-22 | Discharge: 2013-01-22 | Disposition: A | Discharge status: Home / Self Care | Payer: Medicare Other | Source: Ambulatory Visit | Attending: Emergency Medicine | Admitting: Emergency Medicine

## 2013-01-22 NOTE — Progress Notes (Signed)
[name] her today for first exercise session in Pulmonary Rehab.  She was oriented to use of equipment, RPE and Dyspnea Scale and safety and hand hygiene in the exercise area.  Demonstration and practice of PLB on each exercise station.  Felt a little nervous but tolerated the exercise well with oxygen saturations above 90% on each station  We will continue to encourage and support.Marland Kitchen

## 2013-01-24 ENCOUNTER — Encounter (HOSPITAL_COMMUNITY)
Admission: RE | Admit: 2013-01-24 | Discharge: 2013-01-24 | Disposition: A | Discharge status: Home / Self Care | Payer: Medicare Other | Source: Ambulatory Visit | Attending: Emergency Medicine | Admitting: Emergency Medicine

## 2013-01-29 ENCOUNTER — Encounter (HOSPITAL_COMMUNITY)
Admission: RE | Admit: 2013-01-29 | Discharge: 2013-01-29 | Disposition: A | Discharge status: Home / Self Care | Payer: Medicare Other | Source: Ambulatory Visit | Attending: Emergency Medicine | Admitting: Emergency Medicine

## 2013-01-31 ENCOUNTER — Encounter (HOSPITAL_COMMUNITY)
Admission: RE | Admit: 2013-01-31 | Discharge: 2013-01-31 | Disposition: A | Discharge status: Home / Self Care | Payer: Medicare Other | Source: Ambulatory Visit | Attending: Emergency Medicine | Admitting: Emergency Medicine

## 2013-01-31 NOTE — Progress Notes (Signed)
[name] 78 y.o. female Nutrition Note Spoke with pt. Pt is at a normal wt yet pt states she would like to lose "8 lbs."  Although pt says she wants to lose wt, pt is not currently doing anything and has no plan to do anything differently to lose wt. Most meals reportedly prepared at home by pt's husband.  There are several ways the pt can improve her diet to become healthier. Pt's Rate Your Plate results reviewed with pt. Per discussion with pt, pt is not interested in changing her diet at this time. Pt feels she would lose wt if she "became more active." Pt does not avoid salty food; uses canned food. Per pt, pt chooses regular canned food "for the taste" and "I don't use much canned food." Pt reports using fresh/frozen vegetables frequently. Pt adds salt to food. The role of sodium in lung disease reviewed with pt. Pt expressed understanding of the information reviewed.  Nutrition Diagnosis   Food-and nutrition-related knowledge deficit related to lack of exposure to information as related to diagnosis of pulmonary disease   Limited adherence to nutrition-related recommendations related to poor understanding or disinterest as evidenced by food history. Nutrition Rx/Est. Daily Nutrition Needs for: ? wt loss 1200 Kcal  90 gm protein   1500 mg or less sodium      Nutrition Intervention   Pt's individual nutrition plan and goals reviewed with pt.   Benefits of adopting healthy eating habits discussed when pt's Rate Your Plate reviewed.   Pt to attend the Nutrition and Lung Disease class   Continual client-centered nutrition education by RD, as part of interdisciplinary care. Goal(s) 1. Identify food quantities necessary to achieve wt loss of  -2# per week to a goal wt of 54.5-59.1 kg (120-130 lb) at graduation from pulmonary rehab. 2. Describe the benefit of including fruits, vegetables, whole grains, and low-fat dairy products in a healthy meal plan. Monitor and Evaluate progress toward  nutrition goal with team.   Mickle Plumb, M.Ed, RD, LDN, CDE 01/31/2013 1:37 PM

## 2013-02-05 ENCOUNTER — Encounter (HOSPITAL_COMMUNITY)
Admission: RE | Admit: 2013-02-05 | Discharge: 2013-02-05 | Disposition: A | Discharge status: Home / Self Care | Payer: Medicare Other | Source: Ambulatory Visit | Attending: Emergency Medicine | Admitting: Emergency Medicine

## 2013-02-07 ENCOUNTER — Ambulatory Visit (INDEPENDENT_AMBULATORY_CARE_PROVIDER_SITE_OTHER): Payer: Medicare Other | Admitting: Emergency Medicine

## 2013-02-07 ENCOUNTER — Encounter (HOSPITAL_COMMUNITY): Payer: Medicare Other

## 2013-02-07 ENCOUNTER — Encounter: Payer: Self-pay | Admitting: Emergency Medicine

## 2013-02-07 VITALS — BP 120/82 | HR 55 | Temp 97.8°F | Ht 61.0 in | Wt 129.6 lb

## 2013-02-07 DIAGNOSIS — R918 Other nonspecific abnormal finding of lung field: Secondary | ICD-10-CM

## 2013-02-07 DIAGNOSIS — J4489 Other specified chronic obstructive pulmonary disease: Secondary | ICD-10-CM

## 2013-02-07 DIAGNOSIS — J449 Chronic obstructive pulmonary disease, unspecified: Secondary | ICD-10-CM

## 2013-02-07 NOTE — Patient Instructions (Addendum)
Please continue your current medications and oxygen We will repeat your CT scan in October Get your Flu shot this Fall Follow with Dr Delton Coombes in October after the CT scan to review

## 2013-02-07 NOTE — Assessment & Plan Note (Signed)
Spiriva + Symbicort Albuterol prn

## 2013-02-07 NOTE — Progress Notes (Signed)
Subjective:     Patient ID: [name], female   DOB: Aug 07, 1934, 78 y.o.   MRN: [id number]  HPI 78 yo woman, former smoker (50 pk-yrs), hx COPD, HTN and diastolic CHF, PVD. She was admitted for AE-COPD 10/7-10/11. CT scan during that admission showed scattered LL nodular disease. She returns for f/u, has been taking symbicort and spiriva. This was her 2nd hospitalization for AE. She is now feeling almost back to baseline, still with [employer] - hasn't been shopping by herself. Her FEV1 was 0.57L in 07/2010. Very little cough, sputum. She does occasionally wheeze.  She uses SABA several times a week.   ROV 08/07/12 -- Follows up for her severe COPD, associated hypoxemia, frequent exacerbations. Has been managed on Spiriva + Symbicort. O2 2L/min pulsed w exertion >> she increased to 3L/min on her own. Also LL pulm nodular dizease by CT scan, due for repeat April 2014. She began to have chest tightness, cough, chest burning over the weekend (4-5 days ago). She started an old script of pred on her own > a taper that she had from before. Her chest tightness and breathing have improved. Rare cough, clear mucous.   08/17/12 Follow up  Present for a follow up for COPD flare .  Pt saw Cardiology on Tuesday-2/4  and they ordered avelox and prednisone. Pt states  cough is improved, but she is still having  increased SOB with rest and minimal activity. She denies any hemoptysis, orthopnea, PND, or leg swelling Chest x-ray is without acute process.  ROV 09/13/12 -- Follows up for her severe COPD, associated hypoxemia, frequent exacerbations. Was treated for an AE-COPD last month with avelox and pred taper. She is feeling better - back to baseline. Wearing o2 at 2-3L/min. Using a walker to ambulate. No wheeze or sputum. Using SABA 2-3x a day.   ROV 11/08/12 -- severe COPD, associated hypoxemia, frequent exacerbations. Also with scattered B pulm nodules identified on CT scan 04/2012 predominantly RUL. She returns today after  CT scan 4/4 >> stable. She is asking about the coronary plaque seen on the CT scan. She denies CP.  She is stable on spiriva + symbicort. No exacerbations. No new sx  ROV 02/07/13 -- severe COPD, associated hypoxemia, frequent exacerbations. Also with scattered B pulm nodules identified on CT scan 04/2012 predominantly RUL, stable 10/12/12. She has been doing well, no flares. Uses albuterol most days. She wears o2 with exertion, 2l/min.      Objective:   Physical Exam Filed Vitals:   02/07/13 1150  BP: 120/82  Pulse: 55  Temp: 97.8 F (36.6 C)   Gen: Pleasant, elderly woman, in no distress,  normal affect  ENT: No lesions,  mouth clear,  oropharynx clear, no postnasal drip  Neck: No JVD, no TMG, no carotid bruits  Lungs: No use of accessory muscles, distant, diminished BS in bases, no wheeze  Cardiovascular: RRR, heart sounds normal, no murmur or gallops, no peripheral edema  Musculoskeletal: No deformities, no cyanosis or clubbing  Neuro: alert, non focal  Skin: Warm, no lesions or rashes      Assessment:     Multiple lung nodules Needs repeat CT scan in October '14  COPD UNSPECIFIED Spiriva + Symbicort Albuterol prn

## 2013-02-07 NOTE — Assessment & Plan Note (Signed)
Needs repeat CT scan in October '14

## 2013-02-12 ENCOUNTER — Encounter (HOSPITAL_COMMUNITY)
Admission: RE | Admit: 2013-02-12 | Discharge: 2013-02-12 | Disposition: A | Discharge status: Home / Self Care | Payer: Medicare Other | Source: Ambulatory Visit | Attending: Emergency Medicine | Admitting: Emergency Medicine

## 2013-02-12 DIAGNOSIS — Z79899 Other long term (current) drug therapy: Secondary | ICD-10-CM | POA: Insufficient documentation

## 2013-02-12 DIAGNOSIS — E785 Hyperlipidemia, unspecified: Secondary | ICD-10-CM | POA: Insufficient documentation

## 2013-02-12 DIAGNOSIS — Z7982 Long term (current) use of aspirin: Secondary | ICD-10-CM | POA: Insufficient documentation

## 2013-02-12 DIAGNOSIS — I739 Peripheral vascular disease, unspecified: Secondary | ICD-10-CM | POA: Insufficient documentation

## 2013-02-12 DIAGNOSIS — Z5189 Encounter for other specified aftercare: Secondary | ICD-10-CM | POA: Insufficient documentation

## 2013-02-12 DIAGNOSIS — I10 Essential (primary) hypertension: Secondary | ICD-10-CM | POA: Insufficient documentation

## 2013-02-12 DIAGNOSIS — J441 Chronic obstructive pulmonary disease with (acute) exacerbation: Secondary | ICD-10-CM | POA: Insufficient documentation

## 2013-02-14 ENCOUNTER — Encounter (HOSPITAL_COMMUNITY)
Admission: RE | Admit: 2013-02-14 | Discharge: 2013-02-14 | Disposition: A | Discharge status: Home / Self Care | Payer: Medicare Other | Source: Ambulatory Visit | Attending: Emergency Medicine | Admitting: Emergency Medicine

## 2013-02-14 ENCOUNTER — Encounter (HOSPITAL_COMMUNITY): Payer: Self-pay

## 2013-02-14 NOTE — Progress Notes (Unsigned)
I went over Home Exercise with Ms. [name].  She states that she is walking on the treadmill for 15 minutes 4-5x a week in addition to Pulmonary Rehab.  I explained to Ms. [name] on how to appropriately progress her treadmill walking.  I also encouraged her to stretch everyday and gave her a booklet on different weighted exercises and stretches.  She was encouraged to come to me if she has any questions.  Will f/u.

## 2013-02-14 NOTE — Progress Notes (Unsigned)
Subjective:      Patient ID: [name] is a 78 y.o. female.  Chief Complaint: HPI {Common ambulatory SmartLinks:19316} ROS    Objective:    Physical Exam  Lab Review:  ***    Assessment:     No diagnosis found.   Plan:     ***

## 2013-02-19 ENCOUNTER — Encounter (HOSPITAL_COMMUNITY)
Admission: RE | Admit: 2013-02-19 | Discharge: 2013-02-19 | Disposition: A | Discharge status: Home / Self Care | Payer: Medicare Other | Source: Ambulatory Visit | Attending: Emergency Medicine | Admitting: Emergency Medicine

## 2013-02-21 ENCOUNTER — Encounter (HOSPITAL_COMMUNITY)
Admission: RE | Admit: 2013-02-21 | Discharge: 2013-02-21 | Disposition: A | Discharge status: Home / Self Care | Payer: Medicare Other | Source: Ambulatory Visit | Attending: Emergency Medicine | Admitting: Emergency Medicine

## 2013-02-26 ENCOUNTER — Encounter (HOSPITAL_COMMUNITY)
Admission: RE | Admit: 2013-02-26 | Discharge: 2013-02-26 | Disposition: A | Discharge status: Home / Self Care | Payer: Medicare Other | Source: Ambulatory Visit | Attending: Emergency Medicine | Admitting: Emergency Medicine

## 2013-02-28 ENCOUNTER — Encounter (HOSPITAL_COMMUNITY)
Admission: RE | Admit: 2013-02-28 | Discharge: 2013-02-28 | Disposition: A | Discharge status: Home / Self Care | Payer: Medicare Other | Source: Ambulatory Visit | Attending: Emergency Medicine | Admitting: Emergency Medicine

## 2013-03-05 ENCOUNTER — Encounter (HOSPITAL_COMMUNITY)
Admission: RE | Admit: 2013-03-05 | Discharge: 2013-03-05 | Disposition: A | Discharge status: Home / Self Care | Payer: Medicare Other | Source: Ambulatory Visit | Attending: Emergency Medicine | Admitting: Emergency Medicine

## 2013-03-07 ENCOUNTER — Encounter (HOSPITAL_COMMUNITY)
Admission: RE | Admit: 2013-03-07 | Discharge: 2013-03-07 | Disposition: A | Discharge status: Home / Self Care | Payer: Medicare Other | Source: Ambulatory Visit | Attending: Emergency Medicine | Admitting: Emergency Medicine

## 2013-03-12 ENCOUNTER — Encounter (HOSPITAL_COMMUNITY)
Admission: RE | Admit: 2013-03-12 | Discharge: 2013-03-12 | Disposition: A | Discharge status: Home / Self Care | Payer: Medicare Other | Source: Ambulatory Visit | Attending: Emergency Medicine | Admitting: Emergency Medicine

## 2013-03-12 DIAGNOSIS — J441 Chronic obstructive pulmonary disease with (acute) exacerbation: Secondary | ICD-10-CM | POA: Insufficient documentation

## 2013-03-12 DIAGNOSIS — Z79899 Other long term (current) drug therapy: Secondary | ICD-10-CM | POA: Insufficient documentation

## 2013-03-12 DIAGNOSIS — Z5189 Encounter for other specified aftercare: Secondary | ICD-10-CM | POA: Insufficient documentation

## 2013-03-12 DIAGNOSIS — Z7982 Long term (current) use of aspirin: Secondary | ICD-10-CM | POA: Insufficient documentation

## 2013-03-12 DIAGNOSIS — I10 Essential (primary) hypertension: Secondary | ICD-10-CM | POA: Insufficient documentation

## 2013-03-12 DIAGNOSIS — I739 Peripheral vascular disease, unspecified: Secondary | ICD-10-CM | POA: Insufficient documentation

## 2013-03-12 DIAGNOSIS — E785 Hyperlipidemia, unspecified: Secondary | ICD-10-CM | POA: Insufficient documentation

## 2013-03-14 ENCOUNTER — Encounter (HOSPITAL_COMMUNITY)
Admission: RE | Admit: 2013-03-14 | Discharge: 2013-03-14 | Disposition: A | Discharge status: Home / Self Care | Payer: Medicare Other | Source: Ambulatory Visit | Attending: Emergency Medicine | Admitting: Emergency Medicine

## 2013-03-19 ENCOUNTER — Encounter (HOSPITAL_COMMUNITY)
Admission: RE | Admit: 2013-03-19 | Discharge: 2013-03-19 | Disposition: A | Discharge status: Home / Self Care | Payer: Medicare Other | Source: Ambulatory Visit | Attending: Emergency Medicine | Admitting: Emergency Medicine

## 2013-03-20 ENCOUNTER — Telehealth: Payer: Self-pay | Admitting: Emergency Medicine

## 2013-03-20 MED ORDER — TIOTROPIUM BROMIDE MONOHYDRATE 18 MCG IN CAPS: 18.0000 ug | ORAL_CAPSULE | Freq: Every day | RESPIRATORY_TRACT | Status: DC

## 2013-03-20 MED ORDER — BUDESONIDE-FORMOTEROL FUMARATE 160-4.5 MCG/ACT IN AERO: 2.0000 | INHALATION_SPRAY | Freq: Two times a day (BID) | RESPIRATORY_TRACT | Status: DC

## 2013-03-20 NOTE — Telephone Encounter (Signed)
1 of each symbicort and spiriva left up front for pick up  Pt aware and states nothing further needed

## 2013-03-21 ENCOUNTER — Encounter (HOSPITAL_COMMUNITY)
Admission: RE | Admit: 2013-03-21 | Discharge: 2013-03-21 | Disposition: A | Discharge status: Home / Self Care | Payer: Medicare Other | Source: Ambulatory Visit | Attending: Emergency Medicine | Admitting: Emergency Medicine

## 2013-03-21 NOTE — Progress Notes (Signed)
[name]'s blood pressure was abnormally high today while walking on the track today.  It increased to 180/105, oxygen saturations 100% on 2 liters of oxygen, heart rate 90, in no acute distress.  Her blood pressures on the stepper and arm ergometer were 144/80-152/82.  We will continue to monitor and alter exercise if needed.

## 2013-03-26 ENCOUNTER — Encounter (HOSPITAL_COMMUNITY)
Admission: RE | Admit: 2013-03-26 | Discharge: 2013-03-26 | Disposition: A | Discharge status: Home / Self Care | Payer: Medicare Other | Source: Ambulatory Visit | Attending: Emergency Medicine | Admitting: Emergency Medicine

## 2013-03-28 ENCOUNTER — Encounter (HOSPITAL_COMMUNITY)
Admission: RE | Admit: 2013-03-28 | Discharge: 2013-03-28 | Disposition: A | Discharge status: Home / Self Care | Payer: Medicare Other | Source: Ambulatory Visit | Attending: Emergency Medicine | Admitting: Emergency Medicine

## 2013-04-02 ENCOUNTER — Encounter (HOSPITAL_COMMUNITY)
Admission: RE | Admit: 2013-04-02 | Discharge: 2013-04-02 | Disposition: A | Discharge status: Home / Self Care | Payer: Medicare Other | Source: Ambulatory Visit | Attending: Emergency Medicine | Admitting: Emergency Medicine

## 2013-04-04 ENCOUNTER — Encounter (HOSPITAL_COMMUNITY)
Admission: RE | Admit: 2013-04-04 | Discharge: 2013-04-04 | Disposition: A | Discharge status: Home / Self Care | Payer: Medicare Other | Source: Ambulatory Visit | Attending: Emergency Medicine | Admitting: Emergency Medicine

## 2013-04-09 ENCOUNTER — Encounter (HOSPITAL_COMMUNITY)
Admission: RE | Admit: 2013-04-09 | Discharge: 2013-04-09 | Disposition: A | Discharge status: Home / Self Care | Payer: Medicare Other | Source: Ambulatory Visit | Attending: Emergency Medicine | Admitting: Emergency Medicine

## 2013-04-11 ENCOUNTER — Encounter (HOSPITAL_COMMUNITY)
Admission: RE | Admit: 2013-04-11 | Discharge: 2013-04-11 | Disposition: A | Discharge status: Home / Self Care | Payer: Medicare Other | Source: Ambulatory Visit | Attending: Emergency Medicine | Admitting: Emergency Medicine

## 2013-04-11 DIAGNOSIS — Z79899 Other long term (current) drug therapy: Secondary | ICD-10-CM | POA: Insufficient documentation

## 2013-04-11 DIAGNOSIS — I739 Peripheral vascular disease, unspecified: Secondary | ICD-10-CM | POA: Insufficient documentation

## 2013-04-11 DIAGNOSIS — E785 Hyperlipidemia, unspecified: Secondary | ICD-10-CM | POA: Insufficient documentation

## 2013-04-11 DIAGNOSIS — J441 Chronic obstructive pulmonary disease with (acute) exacerbation: Secondary | ICD-10-CM | POA: Insufficient documentation

## 2013-04-11 DIAGNOSIS — I10 Essential (primary) hypertension: Secondary | ICD-10-CM | POA: Insufficient documentation

## 2013-04-11 DIAGNOSIS — Z7982 Long term (current) use of aspirin: Secondary | ICD-10-CM | POA: Insufficient documentation

## 2013-04-11 DIAGNOSIS — Z5189 Encounter for other specified aftercare: Secondary | ICD-10-CM | POA: Insufficient documentation

## 2013-04-16 ENCOUNTER — Encounter (HOSPITAL_COMMUNITY)
Admission: RE | Admit: 2013-04-16 | Discharge: 2013-04-16 | Disposition: A | Discharge status: Home / Self Care | Payer: Medicare Other | Source: Ambulatory Visit | Attending: Emergency Medicine | Admitting: Emergency Medicine

## 2013-04-18 ENCOUNTER — Encounter (HOSPITAL_COMMUNITY)
Admission: RE | Admit: 2013-04-18 | Discharge: 2013-04-18 | Disposition: A | Discharge status: Home / Self Care | Payer: Medicare Other | Source: Ambulatory Visit | Attending: Emergency Medicine | Admitting: Emergency Medicine

## 2013-04-22 ENCOUNTER — Telehealth: Payer: Self-pay | Admitting: Emergency Medicine

## 2013-04-22 NOTE — Telephone Encounter (Signed)
Dr. Delton Coombes per last OV instructions the pt was to f/u after CT in October. The CT is scheduled for 05-02-13. You do not have any available appts and are already doubled on most days. Please advise. Carron Curie, CMA

## 2013-04-23 ENCOUNTER — Encounter (HOSPITAL_COMMUNITY): Admission: RE | Admit: 2013-04-23 | Payer: Medicare Other | Source: Ambulatory Visit

## 2013-04-25 ENCOUNTER — Encounter (HOSPITAL_COMMUNITY): Payer: Medicare Other

## 2013-04-25 NOTE — Telephone Encounter (Signed)
We may be able to open my clinic on a Monday when I don;t have procedure. I don't know of any procedures on the 27th, maybe this is possible? Will forward to triage and to TD

## 2013-04-26 NOTE — Telephone Encounter (Addendum)
Pt scheduled appt on 10/27.[name]

## 2013-04-26 NOTE — Telephone Encounter (Signed)
RB scheduled opened for 05-06-13 can add the pt on there. Carron Curie, CMA

## 2013-04-30 ENCOUNTER — Encounter (HOSPITAL_COMMUNITY): Payer: Medicare Other

## 2013-05-02 ENCOUNTER — Encounter (HOSPITAL_COMMUNITY): Payer: Medicare Other

## 2013-05-02 ENCOUNTER — Ambulatory Visit (INDEPENDENT_AMBULATORY_CARE_PROVIDER_SITE_OTHER)
Admission: RE | Admit: 2013-05-02 | Discharge: 2013-05-02 | Disposition: A | Discharge status: Home / Self Care | Payer: Medicare Other | Source: Ambulatory Visit | Attending: Emergency Medicine | Admitting: Emergency Medicine

## 2013-05-02 DIAGNOSIS — R918 Other nonspecific abnormal finding of lung field: Secondary | ICD-10-CM

## 2013-05-06 ENCOUNTER — Ambulatory Visit (INDEPENDENT_AMBULATORY_CARE_PROVIDER_SITE_OTHER): Payer: Medicare Other | Admitting: Emergency Medicine

## 2013-05-06 ENCOUNTER — Encounter: Payer: Self-pay | Admitting: Emergency Medicine

## 2013-05-06 VITALS — BP 128/82 | HR 97 | Temp 97.6°F | Ht 61.0 in | Wt 128.4 lb

## 2013-05-06 DIAGNOSIS — R918 Other nonspecific abnormal finding of lung field: Secondary | ICD-10-CM

## 2013-05-06 DIAGNOSIS — J449 Chronic obstructive pulmonary disease, unspecified: Secondary | ICD-10-CM

## 2013-05-06 MED ORDER — TIOTROPIUM BROMIDE MONOHYDRATE 18 MCG IN CAPS: 18.0000 ug | ORAL_CAPSULE | Freq: Every day | RESPIRATORY_TRACT | Status: DC

## 2013-05-06 MED ORDER — BUDESONIDE-FORMOTEROL FUMARATE 160-4.5 MCG/ACT IN AERO: 2.0000 | INHALATION_SPRAY | Freq: Two times a day (BID) | RESPIRATORY_TRACT | Status: DC

## 2013-05-06 NOTE — Patient Instructions (Signed)
Please continue your Spiriva and Symbicort as you are taking them  Use albuterol as needed We will repeat your CT scan of the chest in January 2015 Follow with Dr Delton Coombes in January after your CT is done.

## 2013-05-06 NOTE — Progress Notes (Signed)
Subjective:     Patient ID: [name], female   DOB: 1935/05/21, 78 y.o.   MRN: [id number]  HPI 78 yo woman, former smoker (50 pk-yrs), hx COPD, HTN and diastolic CHF, PVD. She was admitted for AE-COPD 10/7-10/11. CT scan during that admission showed scattered LL nodular disease. She returns for f/u, has been taking symbicort and spiriva. This was her 2nd hospitalization for AE. She is now feeling almost back to baseline, still with [employer] - hasn't been shopping by herself. Her FEV1 was 0.57L in 07/2010. Very little cough, sputum. She does occasionally wheeze.  She uses SABA several times a week.   ROV 08/07/12 -- Follows up for her severe COPD, associated hypoxemia, frequent exacerbations. Has been managed on Spiriva + Symbicort. O2 2L/min pulsed w exertion >> she increased to 3L/min on her own. Also LL pulm nodular dizease by CT scan, due for repeat April 2014. She began to have chest tightness, cough, chest burning over the weekend (4-5 days ago). She started an old script of pred on her own > a taper that she had from before. Her chest tightness and breathing have improved. Rare cough, clear mucous.   08/17/12 Follow up  Present for a follow up for COPD flare .  Pt saw Cardiology on Tuesday-2/4  and they ordered avelox and prednisone. Pt states  cough is improved, but she is still having  increased SOB with rest and minimal activity. She denies any hemoptysis, orthopnea, PND, or leg swelling Chest x-ray is without acute process.  ROV 09/13/12 -- Follows up for her severe COPD, associated hypoxemia, frequent exacerbations. Was treated for an AE-COPD last month with avelox and pred taper. She is feeling better - back to baseline. Wearing o2 at 2-3L/min. Using a walker to ambulate. No wheeze or sputum. Using SABA 2-3x a day.   ROV 11/08/12 -- severe COPD, associated hypoxemia, frequent exacerbations. Also with scattered B pulm nodules identified on CT scan 04/2012 predominantly RUL. She returns today after  CT scan 4/4 >> stable. She is asking about the coronary plaque seen on the CT scan. She denies CP.  She is stable on spiriva + symbicort. No exacerbations. No new sx  ROV 02/07/13 -- severe COPD, associated hypoxemia, frequent exacerbations. Also with scattered B pulm nodules identified on CT scan 04/2012 predominantly RUL, stable 10/12/12. She has been doing well, no flares. Uses albuterol most days. She wears o2 with exertion, 2l/min.   ROV 05/06/13 -- severe COPD, associated hypoxemia, frequent exacerbations. She completed pulm rehab at [facility] since last time. Also with scattered B pulm nodules identified on CT scan 04/2012 predominantly RUL, stable 10/12/12. Repeat CT done 05/02/13 shows stability of nodules, new irregular area of GG and consolidation.  She has been well, denies any PNA or exacerbations. No cough. She does have exertional dyspnea. Uses SABA 1-2x a day.      Objective:   Physical Exam Filed Vitals:   05/06/13 1341  BP: 128/82  Pulse: 97  Temp: 97.6 F (36.4 C)   Gen: Pleasant, elderly woman, in no distress,  normal affect  ENT: No lesions,  mouth clear,  oropharynx clear, no postnasal drip  Neck: No JVD, no TMG, no carotid bruits  Lungs: No use of accessory muscles, distant, diminished BS in bases, no wheeze  Cardiovascular: RRR, heart sounds normal, no murmur or gallops, no peripheral edema  Musculoskeletal: No deformities, no cyanosis or clubbing  Neuro: alert, non focal  Skin: Warm, no lesions or rashes  05/02/13 --  COMPARISON: 04/18/2012  FINDINGS: Changes centrilobular emphysema noted. New area of subpleural consolidation is identified in the right upper lobe measuring 1.7 cm, image number 9/series 3. Interval decrease in index right upper lobe nodule which now measures 4 mm, image 23/ series 3. Previously 8 mm. At a slightly more caudal level there is a 9 mm nodule, image 26/ series 3. This is stable from 04/18/2012. Smaller adjacent nodule is also  stable measuring 3 mm, image 30/ series 3. Stable left upper lobe nodule measuring 3 mm, image 16/ series 3.  The trachea appears patent and is midline. The heart size appears normal. Calcifications involving the LAD, left circumflex and RCA Coronary artery noted. There is no mediastinal or hilar adenopathy identified. No axillary or supraclavicular adenopathy identified.  Incidental imaging through the upper abdomen is on unremarkable.  Review of the visualized osseous structures is significant for mild multi level spondylosis.  IMPRESSION: 1. Stable pulmonary nodules within the mid and basilar portion of the right upper lobe. Advise continued followup of these nodules to confirm stability. The next surveillance exam should be obtained in 9-12 months from today.  2. There is a new area of consolidation within the peripheral portions of the right upper lobe. This is favored to be the sequela of inflammation or infection. Short-term followup imaging is advised to ensure resolution. Consider a repeat CT of the chest without contrast in 3 months.  3. Prominent Coronary artery calcifications.      Assessment:     COPD UNSPECIFIED - continue spiriva and symbicort - o2 at 2L/min - saba prn  Multiple lung nodules New area of consolidation in the RUL. The other nodules are stable.  - need to check CT scan sooner to eval the RUL lesion > 3 months

## 2013-05-06 NOTE — Addendum Note (Signed)
Addended by: Gwynneth Aliment A on: 05/06/2013 02:04 PM   Modules accepted: Orders

## 2013-05-06 NOTE — Assessment & Plan Note (Signed)
-   continue spiriva and symbicort - o2 at 2L/min - saba prn

## 2013-05-06 NOTE — Assessment & Plan Note (Signed)
New area of consolidation in the RUL. The other nodules are stable.  - need to check CT scan sooner to eval the RUL lesion > 3 months

## 2013-05-07 ENCOUNTER — Encounter (HOSPITAL_COMMUNITY): Payer: Medicare Other

## 2013-06-17 ENCOUNTER — Telehealth: Payer: Self-pay | Admitting: Emergency Medicine

## 2013-06-17 NOTE — Telephone Encounter (Signed)
Pt requesting sample of spiriva.  Advise pt sample would be a front desk for pick up. Nothing further is needed

## 2013-07-21 ENCOUNTER — Other Ambulatory Visit: Payer: Self-pay | Admitting: Physician Assistant

## 2013-08-05 ENCOUNTER — Ambulatory Visit (INDEPENDENT_AMBULATORY_CARE_PROVIDER_SITE_OTHER)
Admission: RE | Admit: 2013-08-05 | Discharge: 2013-08-05 | Disposition: A | Discharge status: Home / Self Care | Payer: Medicare Other | Source: Ambulatory Visit | Attending: Emergency Medicine | Admitting: Emergency Medicine

## 2013-08-05 DIAGNOSIS — R918 Other nonspecific abnormal finding of lung field: Secondary | ICD-10-CM

## 2013-08-07 ENCOUNTER — Other Ambulatory Visit: Payer: Self-pay | Admitting: Adult Health

## 2013-08-08 ENCOUNTER — Telehealth: Payer: Self-pay | Admitting: Emergency Medicine

## 2013-08-08 NOTE — Telephone Encounter (Signed)
I called and spoke with pt. She has pending appt with RB next Friday. She wanted to come in and be seen sooner. I offered pt appt with MW today/tomorrow but refused. She reports if she decides to come in then she will call us back. She needed nothing further

## 2013-08-16 ENCOUNTER — Encounter: Payer: Self-pay | Admitting: Emergency Medicine

## 2013-08-16 ENCOUNTER — Ambulatory Visit (INDEPENDENT_AMBULATORY_CARE_PROVIDER_SITE_OTHER): Payer: Medicare Other | Admitting: Emergency Medicine

## 2013-08-16 VITALS — BP 150/90 | HR 104 | Ht 61.0 in | Wt 126.0 lb

## 2013-08-16 DIAGNOSIS — R918 Other nonspecific abnormal finding of lung field: Secondary | ICD-10-CM

## 2013-08-16 DIAGNOSIS — J449 Chronic obstructive pulmonary disease, unspecified: Secondary | ICD-10-CM

## 2013-08-16 MED ORDER — PREDNISONE 10 MG PO TABS: ORAL_TABLET | ORAL | Status: DC

## 2013-08-16 NOTE — Assessment & Plan Note (Signed)
With an AE. She would like to discuss alternative BD's like Cherene AltesBreo, Anoro, [place] but I don;t want to do while she is sick - pred taper - finish the augmentin - will discuss possible change in scheduled BD's next tiime - rov 1

## 2013-08-16 NOTE — Assessment & Plan Note (Signed)
CT scan stable since 10/13.  - need repeat CT in Oct 2015

## 2013-08-16 NOTE — Progress Notes (Signed)
Subjective:     Patient ID: Sheri SleeperHazel H Barsamian, female   DOB: [birth year], 79 y.o.   MRN: [id number]  HPI 79 yo woman, former smoker (50 pk-yrs), hx COPD, HTN and diastolic CHF, PVD. She was admitted for AE-COPD 10/7-10/11. CT scan during that admission showed scattered LL nodular disease. She returns for f/u, has been taking symbicort and spiriva. This was her 2nd hospitalization for AE. She is now feeling almost back to baseline, still with [employer] - hasn't been shopping by herself. Her FEV1 was 0.57L in 07/2010. Very little cough, sputum. She does occasionally wheeze.  She uses SABA several times a week.   ROV 08/07/12 -- Follows up for her severe COPD, associated hypoxemia, frequent exacerbations. Has been managed on Spiriva + Symbicort. O2 2L/min pulsed w exertion >> she increased to 3L/min on her own. Also LL pulm nodular dizease by CT scan, due for repeat April 2014. She began to have chest tightness, cough, chest burning over the weekend (4-5 days ago). She started an old script of pred on her own > a taper that she had from before. Her chest tightness and breathing have improved. Rare cough, clear mucous.   08/17/12 Follow up  Present for a follow up for COPD flare .  Pt saw Cardiology on Tuesday-2/4  and they ordered avelox and prednisone. Pt states  cough is improved, but she is still having  increased SOB with rest and minimal activity. She denies any hemoptysis, orthopnea, PND, or leg swelling Chest x-ray is without acute process.  ROV 09/13/12 -- Follows up for her severe COPD, associated hypoxemia, frequent exacerbations. Was treated for an AE-COPD last month with avelox and pred taper. She is feeling better - back to baseline. Wearing o2 at 2-3L/min. Using a walker to ambulate. No wheeze or sputum. Using SABA 2-3x a day.   ROV 11/08/12 -- severe COPD, associated hypoxemia, frequent exacerbations. Also with scattered B pulm nodules identified on CT scan 04/2012 predominantly RUL. She returns today after  CT scan 4/4 >> stable. She is asking about the coronary plaque seen on the CT scan. She denies CP.  She is stable on spiriva + symbicort. No exacerbations. No new sx  ROV 02/07/13 -- severe COPD, associated hypoxemia, frequent exacerbations. Also with scattered B pulm nodules identified on CT scan 04/2012 predominantly RUL, stable 10/12/12. She has been doing well, no flares. Uses albuterol most days. She wears o2 with exertion, 2l/min.   ROV 05/06/13 -- severe COPD, associated hypoxemia, frequent exacerbations. She [facility] pulm [facility] since last time. Also with scattered B pulm nodules identified on CT scan 04/2012 predominantly RUL, stable 10/12/12. Repeat CT done 05/02/13 shows stability of nodules, new irregular area of GG and consolidation.  She has been well, denies any PNA or exacerbations. No cough. She does have exertional dyspnea. Uses SABA 1-2x a day.   ROV 08/16/13 -- severe COPD, associated hypoxemia, frequent exacerbations. Pulmonary nodules and some GGI being followed by CT scan.  She started having some dry cough 1/28, was seen by her PCP and started Augmentin, has 2 more days to go. Was not treated with pred. Remains on symbicort, spiriva     Objective:   Physical Exam Filed Vitals:   08/16/13 1330  BP: 150/90  Pulse: 104  Height: 5\' 1"  (1.549 m)  Weight: 126 lb (57.153 kg)  SpO2: 94%   Gen: Pleasant, elderly woman, in no distress,  normal affect  ENT: No lesions,  mouth clear,  oropharynx  clear, no postnasal drip  Neck: No JVD, no TMG, no carotid bruits  Lungs: No use of accessory muscles, distant, diminished BS in bases, no wheeze  Cardiovascular: RRR, heart sounds normal, no murmur or gallops, no peripheral edema  Musculoskeletal: No deformities, no cyanosis or clubbing  Neuro: alert, non focal  Skin: Warm, no lesions or rashes  CT scan 08/05/13 --  .COMPARISON: 05/02/2013 and 04/18/2012  FINDINGS:  Moderate emphysema again demonstrated. Scarring in the  lateral right  upper lobe has a less bulky appearance than on previous study.  Scattered pulmonary nodules are again seen within both lungs, which  remain stable compared to previous studies. Largest nodule is seen  in the posterior right upper lobe measuring 8 mm on image 25.  No evidence of pulmonary infiltrate or central endobronchial  obstruction. No evidence of pleural or pericardial effusion. No  evidence of hilar or mediastinal masses. No adenopathy seen  elsewhere within the thorax. No evidence of chest wall mass or  suspicious bone lesions. Both adrenal glands are normal in  appearance.  IMPRESSION:  Stable bilateral pulmonary nodules, largest measuring 8 mm in right  upper lobe. Continued followup by CT recommended in 9 months, to  confirm stability over at least 2 years since original exam on  04/18/2012.  Moderate emphysema.       Assessment:     COPD UNSPECIFIED With an AE. She would like to discuss alternative BD's like Cherene Altes, [place] but I don;t want to do while she is sick - pred taper - finish the augmentin - will discuss possible change in scheduled BD's next tiime - rov 1

## 2013-08-16 NOTE — Patient Instructions (Signed)
Please finish your Augmentin Take your prednisone taper as directed to completion Continue your same inhaled medications at this time. We can discuss changing these at your next visit You need a repeat CT scan in October 2015 Follow with Dr Delton CoombesByrum in 1 month

## 2013-08-21 ENCOUNTER — Other Ambulatory Visit: Payer: Self-pay | Admitting: Emergency Medicine

## 2013-09-13 ENCOUNTER — Ambulatory Visit (INDEPENDENT_AMBULATORY_CARE_PROVIDER_SITE_OTHER): Payer: Medicare Other | Admitting: Emergency Medicine

## 2013-09-13 ENCOUNTER — Encounter: Payer: Self-pay | Admitting: Emergency Medicine

## 2013-09-13 VITALS — BP 122/80 | HR 82 | Ht 61.0 in | Wt 128.0 lb

## 2013-09-13 DIAGNOSIS — J449 Chronic obstructive pulmonary disease, unspecified: Secondary | ICD-10-CM

## 2013-09-13 DIAGNOSIS — R918 Other nonspecific abnormal finding of lung field: Secondary | ICD-10-CM

## 2013-09-13 DIAGNOSIS — J4489 Other specified chronic obstructive pulmonary disease: Secondary | ICD-10-CM

## 2013-09-13 NOTE — Assessment & Plan Note (Signed)
Stable by CT scan. Needs repeat Ct in October '15

## 2013-09-13 NOTE — Patient Instructions (Signed)
Continue your oxygen as you are using it Stop symbicort and spiriva for now Start Anoro 1 inhalation daily until next visit.  Follow with Dr Delton CoombesByrum in 1 month to discuss how you have done on the Anoro.  You will need a repeat CT scan in October 2015

## 2013-09-13 NOTE — Assessment & Plan Note (Signed)
Will try changing her Symbicort and Spiriva to Anoro to see if she prefers.  Follow in 1 month to assess

## 2013-09-13 NOTE — Progress Notes (Signed)
Subjective:     Patient ID: Sheri SleeperHazel H Frappier, female   DOB: 13-Jul-1934, 79 y.o.   MRN: [id number]  HPI 79 yo woman, former smoker (50 pk-yrs), hx COPD, HTN and diastolic CHF, PVD. She was admitted for AE-COPD 10/7-10/11. CT scan during that admission showed scattered LL nodular disease. She returns for f/u, has been taking symbicort and spiriva. This was her 2nd hospitalization for AE. She is now feeling almost back to baseline, still with [employer] - hasn't been shopping by herself. Her FEV1 was 0.57L in 07/2010. Very little cough, sputum. She does occasionally wheeze.  She uses SABA several times a week.   ROV 08/07/12 -- Follows up for her severe COPD, associated hypoxemia, frequent exacerbations. Has been managed on Spiriva + Symbicort. O2 2L/min pulsed w exertion >> she increased to 3L/min on her own. Also LL pulm nodular dizease by CT scan, due for repeat April 2014. She began to have chest tightness, cough, chest burning over the weekend (4-5 days ago). She started an old script of pred on her own > a taper that she had from before. Her chest tightness and breathing have improved. Rare cough, clear mucous.   08/17/12 Follow up  Present for a follow up for COPD flare .  Pt saw Cardiology on Tuesday-2/4  and they ordered avelox and prednisone. Pt states  cough is improved, but she is still having  increased SOB with rest and minimal activity. She denies any hemoptysis, orthopnea, PND, or leg swelling Chest x-ray is without acute process.  ROV 09/13/12 -- Follows up for her severe COPD, associated hypoxemia, frequent exacerbations. Was treated for an AE-COPD last month with avelox and pred taper. She is feeling better - back to baseline. Wearing o2 at 2-3L/min. Using a walker to ambulate. No wheeze or sputum. Using SABA 2-3x a day.   ROV 11/08/12 -- severe COPD, associated hypoxemia, frequent exacerbations. Also with scattered B pulm nodules identified on CT scan 04/2012 predominantly RUL. She returns today after  CT scan 4/4 >> stable. She is asking about the coronary plaque seen on the CT scan. She denies CP.  She is stable on spiriva + symbicort. No exacerbations. No new sx  ROV 02/07/13 -- severe COPD, associated hypoxemia, frequent exacerbations. Also with scattered B pulm nodules identified on CT scan 04/2012 predominantly RUL, stable 10/12/12. She has been doing well, no flares. Uses albuterol most days. She wears o2 with exertion, 2l/min.   ROV 05/06/13 -- severe COPD, associated hypoxemia, frequent exacerbations. She completed pul[facility] since last time. Also with scattered B pulm nodules identified on CT scan 04/2012 predominantly RUL, stable 10/12/12. Repeat CT done 05/02/13 shows stability of nodules, new irregular area of GG and consolidation.  She has been well, denies any PNA or exacerbations. No cough. She does have exertional dyspnea. Uses SABA 1-2x a day.   ROV 08/16/13 -- severe COPD, associated hypoxemia, frequent exacerbations. Pulmonary nodules and some GGI being followed by CT scan.  She started having some dry cough 1/28, was seen by her PCP and started Augmentin, has 2 more days to go. Was not treated with pred. Remains on symbicort, spiriva  ROV 09/13/13 -- severe COPD, associated hypoxemia, frequent exacerbations. We have also ben following some pulm nodules and GGI, last CT scan chest was 08/05/13 and showed stability. She need repeat CT in October. She has recovered from recent AE, treated with augmentin and pred.      Objective:   Physical Exam Filed  Vitals:   09/13/13 1403  BP: 122/80  Pulse: 82  Height: 5\' 1"  (1.549 m)  Weight: 128 lb (58.06 kg)  SpO2: 97%   Gen: Pleasant, elderly woman, in no distress,  normal affect  ENT: No lesions,  mouth clear,  oropharynx clear, no postnasal drip  Neck: No JVD, no TMG, no carotid bruits  Lungs: No use of accessory muscles, distant, diminished BS in bases, no wheeze  Cardiovascular: RRR, heart sounds normal, no murmur or gallops,  no peripheral edema  Musculoskeletal: No deformities, no cyanosis or clubbing  Neuro: alert, non focal  Skin: Warm, no lesions or rashes  CT scan 08/05/13 --  .COMPARISON: 05/02/2013 and 04/18/2012  FINDINGS:  Moderate emphysema again demonstrated. Scarring in the lateral right  upper lobe has a less bulky appearance than on previous study.  Scattered pulmonary nodules are again seen within both lungs, which  remain stable compared to previous studies. Largest nodule is seen  in the posterior right upper lobe measuring 8 mm on image 25.  No evidence of pulmonary infiltrate or central endobronchial  obstruction. No evidence of pleural or pericardial effusion. No  evidence of hilar or mediastinal masses. No adenopathy seen  elsewhere within the thorax. No evidence of chest wall mass or  suspicious bone lesions. Both adrenal glands are normal in  appearance.  IMPRESSION:  Stable bilateral pulmonary nodules, largest measuring 8 mm in right  upper lobe. Continued followup by CT recommended in 9 months, to  confirm stability over at least 2 years since original exam on  04/18/2012.  Moderate emphysema.       Assessment:     COPD UNSPECIFIED Will try changing her Symbicort and Spiriva to Anoro to see if she prefers.  Follow in 1 month to assess   Multiple lung nodules Stable by CT scan. Needs repeat Ct in October '15

## 2013-09-16 ENCOUNTER — Telehealth: Payer: Self-pay | Admitting: Emergency Medicine

## 2013-09-16 NOTE — Telephone Encounter (Signed)
Pt states that Anosro had made her breathing worse and she discontinued taking it. She has started back her symbicort and spiriva daily.  Advised her I would let RB and if there was any concern about this I would let her know.

## 2013-10-17 ENCOUNTER — Ambulatory Visit: Payer: Medicare Other | Admitting: Emergency Medicine

## 2014-02-03 ENCOUNTER — Ambulatory Visit (INDEPENDENT_AMBULATORY_CARE_PROVIDER_SITE_OTHER): Payer: Medicare Other | Admitting: Cardiology

## 2014-02-03 VITALS — BP 128/78 | HR 55 | Ht 61.0 in | Wt 128.0 lb

## 2014-02-03 DIAGNOSIS — R0609 Other forms of dyspnea: Secondary | ICD-10-CM

## 2014-02-03 DIAGNOSIS — I10 Essential (primary) hypertension: Secondary | ICD-10-CM

## 2014-02-03 DIAGNOSIS — I739 Peripheral vascular disease, unspecified: Secondary | ICD-10-CM

## 2014-02-03 DIAGNOSIS — R0989 Other specified symptoms and signs involving the circulatory and respiratory systems: Secondary | ICD-10-CM

## 2014-02-03 MED ORDER — NEBIVOLOL HCL 5 MG PO TABS: 5.0000 mg | ORAL_TABLET | Freq: Every day | ORAL | Status: DC

## 2014-02-03 NOTE — Progress Notes (Signed)
Patient ID: GURNEET MATARESE, female   DOB: 1934-11-07, 79 y.o.   MRN: [id number]    Patient Name: [name] Date of Encounter: 02/03/2014  Primary Care Provider:  Herb Grays, MD Primary Cardiologist:  Lars Masson (previously Dr. Daleen Squibb)  Problem List   Past Medical History  Diagnosis Date  . PVD (peripheral vascular disease)   . Actinomycosis, cervicofacial   . Emphysema   . COPD (chronic obstructive pulmonary disease)   . HTN (hypertension)   . Hx of echocardiogram     Echocardiogram 04/16/12: Mild focal basal septal hypertrophy, EF 70-75% with dynamic obstruction with mid cavity obliteration, grade 1 diastolic dysfunction, MAC, atrial septal thickening with findings consistent with lipomatous hypertrophy  . Hyperlipidemia   . CAD (coronary artery disease)     a. coronary calcification on prior Chest CT;  b. Dob MV 1/14:  EF 85%, no ischemia   Past Surgical History  Procedure Laterality Date  . Appendectomy    . Tonsillectomy    . Bone graft hip iliac crest      Allergies  Allergies  Allergen Reactions  . Penicillins Hives    Has taken it since.  . Pneumovax [Pneumococcal Polysaccharide Vaccine] Hives    HPI  She has a hx of PAD, HTN, COPD. She's had a question of infrarenal abdominal aorta dilatation on CT in the past. Subsequent ultrasounds have been negative for aneurysm. She last saw Dr. Daleen Squibb 8/12 after undergoing a "Life Screening Survey". This demonstrated mild carotid artery stenosis bilaterally (no percentage given) and mildly reduced ABIs bilaterally (0.9). Recommendation was to repeat her carotid Dopplers in 02/2013. Admitted 04/2012 with a COPD exacerbation. She was followed by pulmonary/critical care. Chest CT was negative for pulmonary embolism. She did have multiple lung nodules noted and needs followup CT in 3-6 months. Echocardiogram 04/16/12: Mild focal basal septal hypertrophy, EF 70-75% with dynamic obstruction with mid cavity obliteration, grade  1 diastolic dysfunction, MAC, atrial septal thickening with findings consistent with lipomatous hypertrophy.  I saw her in followup 05/09/12. I titrated her beta blocker given her hypertrophic cardiomyopathy to slow her HR some. She remains on chronic O2. Chronic dyspnea is essentially unchanged.  I last saw her 07/23/12 and she complained of chest pain. Of note, she had coronary calcifications on chest CT in 04/2012. Dobutamine Myoview 08/06/12: No ischemia, EF 85%. Today, she notes worsening cough and congestion. Sputum is yellow. No hemoptysis. No fever. She notes worsening dyspnea as well. Remains on O2. She has chest tightness that is better with SABA and cough. No wheezing.  Labs (10/13): K 4.5, creatinine 0.76, ALT 16, Hgb 14  R LE ultrasound 10/13: Negative for DVT   02/03/2014 - the patient complains of worsening dyspnea on exertion after walking just a few feet. She denies any chest pain, palpitations or syncope. She denies any paroxysmal nocturnal dyspnea. She continues using home O2 therapy. She also states that her toes discoloration of her toes is getting worse. She denies any claudications. She also complains of intermittent lower extremity edema around her ankles bilaterally.  Home Medications  Prior to Admission medications   Medication Sig Start Date End Date Taking? Authorizing Provider  albuterol (PROVENTIL HFA;VENTOLIN HFA) 108 (90 BASE) MCG/ACT inhaler Inhale 2 puffs into the lungs every 6 (six) hours as needed. For shortness of breath   Yes Historical Provider, MD  aspirin 81 MG tablet Take 81 mg by mouth daily.     Yes Historical Provider, MD  atorvastatin (  LIPITOR) 10 MG tablet Take 10 mg by mouth daily.     Yes Historical Provider, MD  calcium-vitamin D (OSCAL WITH D) 500-200 MG-UNIT per tablet Take 1 tablet by mouth 2 (two) times daily.     Yes Historical Provider, MD  famotidine (PEPCID) 20 MG tablet Take 1 tablet 2 times daily for 4 weeks then as needed 07/23/12  Yes Scott T  Alben SpittleWeaver, PA-C  metoprolol tartrate (LOPRESSOR) 25 MG tablet TAKE 1 TABLET BY MOUTH TWICE DAILY 07/21/13  Yes Lars MassonKatarina H Shaka Cardin, MD  Multiple Vitamin (MULTIVITAMIN) tablet Take 1 tablet by mouth daily.     Yes Historical Provider, MD  SPIRIVA HANDIHALER 18 MCG inhalation capsule INHALE CONTENTS OF 1 CAPSULE ONCE EVERY DAY AS DIRECTED 08/07/13  Yes Tammy S Parrett, NP  SYMBICORT 160-4.5 MCG/ACT inhaler INHALE 2 PUFFS INTO THE LUNGS 2 (TWO) TIMES DAILY. 08/21/13  Yes Leslye Peerobert S Byrum, MD  vitamin C (ASCORBIC ACID) 500 MG tablet Take 500 mg by mouth 2 (two) times daily.   Yes Historical Provider, MD    Family History  Family History  Problem Relation Age of Onset  . Coronary artery disease      family hx of female 1st. degree relative <50/family hx of 1st. degree relative <60  . Emphysema Father   . Heart disease Father   . Stomach cancer Father   . Emphysema      3 sisters. they were smokers  . Stomach cancer Sister   . Emphysema Sister   . Heart disease Mother   . Coronary artery disease Brother     Social History  History   Social History  . Marital Status: Married    Spouse Name: N/A    Number of Children: 4  . Years of Education: N/A   Occupational History  . retired     [employer]   Social History Main Topics  . Smoking status: Former Smoker -- 1.00 packs/day for 49 years    Types: Cigarettes    Quit date: 07/11/2005  . Smokeless tobacco: Never Used     Comment: smoked for 48 years 1 ppd  . Alcohol Use: No  . Drug Use: No  . Sexual Activity: Not on file   Other Topics Concern  . Not on file   Social History Narrative  . No narrative on file     Review of Systems, as per HPI, otherwise negative General:  No chills, fever, night sweats or weight changes.  Cardiovascular:  No chest pain, dyspnea on exertion, edema, orthopnea, palpitations, paroxysmal nocturnal dyspnea. Dermatological: No rash, lesions/masses Respiratory: No cough, dyspnea Urologic: No hematuria,  dysuria Abdominal:   No nausea, vomiting, diarrhea, bright red blood per rectum, melena, or hematemesis Neurologic:  No visual changes, wkns, changes in mental status. All other systems reviewed and are otherwise negative except as noted above.  Physical Exam  Blood pressure 128/78, pulse 55, height 5\' 1"  (1.549 m), weight 128 lb (58.06 kg).  General: Pleasant, NAD Psych: Normal affect. Neuro: Alert and oriented X 3. Moves all extremities spontaneously. HEENT: Normal  Neck: Supple without bruits or JVD. Lungs:  Resp regular and unlabored, CTA. Heart: RRR no s3, s4, or murmurs. Abdomen: Soft, non-tender, non-distended, BS + x 4.  Extremities: No clubbing, mild bilateral cyanosis, no edema. Radials 2+ and equal bilaterally. DP/PT + 1 on the left, not palpable on the right.  Labs:  No results found for this basename: CKTOTAL, CKMB, TROPONINI,  in the last 72  hours Lab Results  Component Value Date   WBC 11.2* 04/19/2012   HGB 14.0 04/19/2012   HCT 40.5 04/19/2012   MCV 92.9 04/19/2012   PLT 349 04/19/2012    No results found for this basename: DDIMER   No components found with this basename: POCBNP,     Component Value Date/Time   NA 134* 04/20/2012 0605   K 4.5 04/20/2012 0605   CL 91* 04/20/2012 0605   CO2 32 04/20/2012 0605   GLUCOSE 120* 04/20/2012 0605   BUN 20 04/20/2012 0605   CREATININE 0.76 04/20/2012 0605   CALCIUM 9.9 04/20/2012 0605   PROT 7.1 04/17/2012 0655   ALBUMIN 3.5 04/17/2012 0655   AST 20 04/17/2012 0655   ALT 16 04/17/2012 0655   ALKPHOS 60 04/17/2012 0655   BILITOT 0.3 04/17/2012 0655   GFRNONAA 81* 04/20/2012 0605   GFRAA >90 04/20/2012 0605   Lab Results  Component Value Date   CHOL 213 12/16/2008   HDL 62 12/16/2008   LDLCALC 782 12/16/2008    Accessory Clinical Findings  Echocardiogram - 04/18/2014 Study Conclusions  - Left ventricle: The cavity size was normal. There was mild focal basal hypertrophy of the septum. The estimated ejection  fraction was in the range of 70% to 75%. There was dynamic obstruction, with mid-cavity obliteration. Wall motion was normal; there were no regional wall motion abnormalities. Doppler parameters are consistent with abnormal left ventricular relaxation (grade 1 diastolic dysfunction). - Mitral valve: Calcified annulus. - Atrial septum: There was increased thickness of the septum, consistent with lipomatous hypertrophy.  ECG - sinus bradycardia, 55 beats per minute, early repolarization, otherwise normal EKG   Assessment & Plan  1. Worsening dyspnea on exertion - this is most probably multifactorial secondary to COPD on home O2 therapy and hypertrophic cardiomyopathy. She needs to be continued on a blockers however we'll try to switch metoprolol to nebivolol. We will also order a nuclear stress test to rule out ischemia.  2. Hypertension - controlled  3. COPD - on home O2 therapy. No recent exacerbation.  4. Peripheral arterial disease - bluish discoloration of her lower extremities with decreased pulses bilaterally we will order bilateral arterial lower extremity duplex.  5. Coronary calcification on CT - Continue ASA, beta blocker and statin.  Followup in 2 months.  Lars Masson, MD, [facility] 02/03/2014, 11:45 AM

## 2014-02-03 NOTE — Patient Instructions (Signed)
**Note De-Identified Cleotilde Spadaccini Obfuscation** Your physician has recommended you make the following change in your medication: stop taking Metoprolol and start taking Bystolic 5 mg daily (if Bystolic is to expensive do not buy and call the office to let us know that and we will [place] back to Metoprolol).  Your physician has requested that you have a lexiscan myoview. For further information please visit [email or web address]. Please follow instruction sheet, as given.  Your physician has requested that you have a lower extremity arterial duplex. This test is an ultrasound of the arteries in the legs or arms. It looks at arterial blood flow in the legs and arms. Allow one hour for Lower and Upper Arterial scans. There are no restrictions or special instructions  Your physician recommends that you schedule a follow-up appointment in: 2 months.

## 2014-02-17 ENCOUNTER — Ambulatory Visit (HOSPITAL_BASED_OUTPATIENT_CLINIC_OR_DEPARTMENT_OTHER): Payer: Medicare Other | Admitting: Radiology

## 2014-02-17 ENCOUNTER — Ambulatory Visit (HOSPITAL_COMMUNITY): Payer: Medicare Other | Attending: Cardiology | Admitting: Cardiology

## 2014-02-17 VITALS — BP 178/90 | HR 61 | Ht 61.0 in | Wt 128.0 lb

## 2014-02-17 DIAGNOSIS — R200 Anesthesia of skin: Secondary | ICD-10-CM

## 2014-02-17 DIAGNOSIS — R0989 Other specified symptoms and signs involving the circulatory and respiratory systems: Principal | ICD-10-CM

## 2014-02-17 DIAGNOSIS — R0600 Dyspnea, unspecified: Secondary | ICD-10-CM

## 2014-02-17 DIAGNOSIS — R0609 Other forms of dyspnea: Secondary | ICD-10-CM

## 2014-02-17 DIAGNOSIS — R0602 Shortness of breath: Secondary | ICD-10-CM

## 2014-02-17 DIAGNOSIS — I739 Peripheral vascular disease, unspecified: Secondary | ICD-10-CM

## 2014-02-17 MED ORDER — AMINOPHYLLINE 25 MG/ML IV SOLN
75.0000 mg | Freq: Once | INTRAVENOUS | Status: AC
  Administered 2014-02-17: 75 mg via INTRAVENOUS

## 2014-02-17 MED ORDER — TECHNETIUM TC 99M SESTAMIBI GENERIC - CARDIOLITE
11.0000 | Freq: Once | INTRAVENOUS | Status: AC | PRN
  Administered 2014-02-17: 11 via INTRAVENOUS

## 2014-02-17 MED ORDER — REGADENOSON 0.4 MG/5ML IV SOLN
0.4000 mg | Freq: Once | INTRAVENOUS | Status: AC
  Administered 2014-02-17: 0.4 mg via INTRAVENOUS

## 2014-02-17 MED ORDER — TECHNETIUM TC 99M SESTAMIBI GENERIC - CARDIOLITE
33.0000 | Freq: Once | INTRAVENOUS | Status: AC | PRN
  Administered 2014-02-17: 33 via INTRAVENOUS

## 2014-02-17 NOTE — Progress Notes (Signed)
[facility] SITE 3 NUCLEAR MED [address] [place], [place] [address] [phone]    Cardiology Nuclear Med Study  [name] is a 79 y.o. female     MRN : [id number]     DOB: 1935/02/22  Procedure Date: 02/17/2014  Nuclear Med Background Indication for Stress Test:  Evaluation for Ischemia History:  No known CAD, Hypertrophic CM, Echo 2013 EF 70-75%, MPI 2014 (normal) EF 85%, COPD Cardiac Risk Factors: Carotid Disease, Strong, Premature Family History - CAD, History of Smoking, Hypertension, Lipids and PVD  Symptoms:  Chest Tightness, DOE and SOB   Nuclear Pre-Procedure Caffeine/Decaff Intake:  None> 12 hrs NPO After: 5:45 am   Lungs:  clear O2 Sat: 92% on 2L O2 via Patient connected to nasal cannula oxygen. IV 0.9% NS with Angio Cath:  22g  IV Site: R Antecubital x 1, tolerated well IV Started by:  Irean HongPatsy Edwards, RN  Chest Size (in):  36 Cup Size: B  Height: 5\' 1"  (1.549 m)  Weight:  128 lb (58.06 kg)  BMI:  Body mass index is 24.2 kg/(m^2). Tech Comments:  Patient took Spiriva, Herbalistymbicort and Bystolic this am. Irean HongPatsy Edwards, RN.    Nuclear Med Study 1 or 2 day study: 1 day  Stress Test Type:  Eugenie BirksLexiscan  Reading MD: N/A  Order Authorizing Provider:  Tobias AlexanderKatarina Nelson, MD  Resting Radionuclide: Technetium 9585m Sestamibi  Resting Radionuclide Dose: 11.0 mCi   Stress Radionuclide:  Technetium 4485m Sestamibi  Stress Radionuclide Dose: 33.0 mCi           Stress Protocol Rest HR: 61 Stress HR: 105  Rest BP: 178/90 Stress BP: 209/109  Exercise Time (min): n/a METS: n/a           Dose of Adenosine (mg):  n/a Dose of Lexiscan: 0.4 mg  Dose of Atropine (mg): n/a Dose of Dobutamine: n/a mcg/kg/min (at max HR)  Stress Test Technologist: Nelson ChimesSharon Brooks, BS-ES  Nuclear Technologist:  Harlow AsaElizabeth Young, CNMT     Rest Procedure:  Myocardial perfusion imaging was performed at rest 45 minutes following the intravenous administration of Technetium 1585m Sestamibi. Rest  ECG: NSR - Normal EKG  Stress Procedure:  The patient received IV Lexiscan 0.4 mg over 15-seconds.  Technetium 3285m Sestamibi injected at 30-seconds.  Quantitative spect images were obtained after a 45 minute delay.  During the infusion of Lexiscan the patient complained of SOB, dizziness, headache and nausea.  The nausea persisted so 75 mg aminophylline was administered.  She quickly began to feel better.  Stress ECG: No significant ST segment change suggestive of ischemia.  QPS Raw Data Images:  Acquisition technically good; normal left ventricular size. Stress Images:  Normal homogeneous uptake in all areas of the myocardium. Rest Images:  Normal homogeneous uptake in all areas of the myocardium. Subtraction (SDS):  No evidence of ischemia. Transient Ischemic Dilatation (Normal <1.22):  1.13 Lung/Heart Ratio (Normal <0.45):  0.26  Quantitative Gated Spect Images QGS EDV:  58 ml QGS ESV:  17 ml  Impression Exercise Capacity:  Lexiscan with no exercise. BP Response:  Hypertensive blood pressure response. Clinical Symptoms:  There is dyspnea. ECG Impression:  No significant ST segment change suggestive of ischemia. Comparison with Prior Nuclear Study: Compared to 08/06/12, no change.  Overall Impression:  Normal stress nuclear study.  LV Ejection Fraction: 71%.  LV Wall Motion:  NL LV Function; NL Wall Motion  [name]

## 2014-02-17 NOTE — Progress Notes (Signed)
Lower arterial doppler performed. 

## 2014-02-18 ENCOUNTER — Telehealth: Payer: Self-pay | Admitting: *Deleted

## 2014-02-18 NOTE — Telephone Encounter (Signed)
Contacted pt about her normal stress test and normal lower extremity duplex per Dr Delton SeeNelson.  Asked pt how her BP has been since starting Bystolic, and the pt stated it was good, at 119/72 yesterday at her appt.  Pt verbalized understanding and pleased with this news.  Will forward this message to Dr Delton SeeNelson for her review.

## 2014-02-25 ENCOUNTER — Encounter: Payer: Self-pay | Admitting: Cardiology

## 2014-04-07 ENCOUNTER — Ambulatory Visit (INDEPENDENT_AMBULATORY_CARE_PROVIDER_SITE_OTHER): Payer: Medicare Other | Admitting: Cardiology

## 2014-04-07 ENCOUNTER — Encounter: Payer: Self-pay | Admitting: Cardiology

## 2014-04-07 VITALS — BP 126/66 | HR 65 | Ht 61.0 in | Wt 128.0 lb

## 2014-04-07 DIAGNOSIS — I739 Peripheral vascular disease, unspecified: Secondary | ICD-10-CM

## 2014-04-07 DIAGNOSIS — R0602 Shortness of breath: Secondary | ICD-10-CM

## 2014-04-07 DIAGNOSIS — R609 Edema, unspecified: Secondary | ICD-10-CM

## 2014-04-07 DIAGNOSIS — R600 Localized edema: Secondary | ICD-10-CM

## 2014-04-07 NOTE — Patient Instructions (Signed)
**Note De-Identified Khori Rosevear Obfuscation** Your physician recommends that you continue on your current medications as directed. Please refer to the Current Medication list given to you today.  Your physician wants you to follow-up in: 1 year. You will receive a reminder letter in the mail two months in advance. If you don't receive a letter, please call our office to schedule the follow-up appointment.  Dr Delton See recommends that you wear compression stockings during the night while sleeping.

## 2014-04-07 NOTE — Progress Notes (Signed)
Patient ID: [name], female   DOB: 1934-12-25, 79 y.o.   MRN: [id number]    Patient Name: [name] Date of Encounter: 04/07/2014  Primary Care Provider:  Herb Grays, MD Primary Cardiologist:  Lars Masson (previously Dr. Daleen Squibb)  Problem List   Past Medical History  Diagnosis Date  . PVD (peripheral vascular disease)   . Actinomycosis, cervicofacial   . Emphysema   . COPD (chronic obstructive pulmonary disease)   . HTN (hypertension)   . Hx of echocardiogram     Echocardiogram 04/16/12: Mild focal basal septal hypertrophy, EF 70-75% with dynamic obstruction with mid cavity obliteration, grade 1 diastolic dysfunction, MAC, atrial septal thickening with findings consistent with lipomatous hypertrophy  . Hyperlipidemia   . CAD (coronary artery disease)     a. coronary calcification on prior Chest CT;  b. Dob MV 1/14:  EF 85%, no ischemia   Past Surgical History  Procedure Laterality Date  . Appendectomy    . Tonsillectomy    . Bone graft hip iliac crest      Allergies  Allergies  Allergen Reactions  . Penicillins Hives    Has taken it since.  . Pneumovax [Pneumococcal Polysaccharide Vaccine] Hives    HPI  She has a hx of PAD, HTN, COPD. She's had a question of infrarenal abdominal aorta dilatation on CT in the past. Subsequent ultrasounds have been negative for aneurysm. She last saw Dr. Daleen Squibb 8/12 after undergoing a "Life Screening Survey". This demonstrated mild carotid artery stenosis bilaterally (no percentage given) and mildly reduced ABIs bilaterally (0.9). Recommendation was to repeat her carotid Dopplers in 02/2013. Admitted 04/2012 with a COPD exacerbation. She was followed by pulmonary/critical care. Chest CT was negative for pulmonary embolism. She did have multiple lung nodules noted and needs followup CT in 3-6 months. Echocardiogram 04/16/12: Mild focal basal septal hypertrophy, EF 70-75% with dynamic obstruction with mid cavity obliteration, grade  1 diastolic dysfunction, MAC, atrial septal thickening with findings consistent with lipomatous hypertrophy.  I saw her in followup 05/09/12. I titrated her beta blocker given her hypertrophic cardiomyopathy to slow her HR some. She remains on chronic O2. Chronic dyspnea is essentially unchanged.  I last saw her 07/23/12 and she complained of chest pain. Of note, she had coronary calcifications on chest CT in 04/2012. Dobutamine Myoview 08/06/12: No ischemia, EF 85%. Today, she notes worsening cough and congestion. Sputum is yellow. No hemoptysis. No fever. She notes worsening dyspnea as well. Remains on O2. She has chest tightness that is better with SABA and cough. No wheezing.  Labs (10/13): K 4.5, creatinine 0.76, ALT 16, Hgb 14  R LE ultrasound 10/13: Negative for DVT   02/03/2014 - the patient complains of worsening dyspnea on exertion after walking just a few feet. She denies any chest pain, palpitations or syncope. She denies any paroxysmal nocturnal dyspnea. She continues using home O2 therapy. She also states that her toes discoloration of her toes is getting worse. She denies any claudications. She also complains of intermittent lower extremity edema around her ankles bilaterally.  04/07/14 - same symptoms of baseline SOB, no chest pain, palpitations, PND or syncope. Mild "puffiness" in the ankles. She switched back to metoprolol as Nebivolol was too expensive.  Home Medications  Prior to Admission medications   Medication Sig Start Date End Date Taking? Authorizing Provider  albuterol (PROVENTIL HFA;VENTOLIN HFA) 108 (90 BASE) MCG/ACT inhaler Inhale 2 puffs into the lungs every 6 (six) hours as needed. For  shortness of breath   Yes Historical Provider, MD  aspirin 81 MG tablet Take 81 mg by mouth daily.     Yes Historical Provider, MD  atorvastatin (LIPITOR) 10 MG tablet Take 10 mg by mouth daily.     Yes Historical Provider, MD  calcium-vitamin D (OSCAL WITH D) 500-200 MG-UNIT per tablet  Take 1 tablet by mouth 2 (two) times daily.     Yes Historical Provider, MD  famotidine (PEPCID) 20 MG tablet Take 1 tablet 2 times daily for 4 weeks then as needed 07/23/12  Yes Scott T Alben Spittle, PA-C  metoprolol tartrate (LOPRESSOR) 25 MG tablet TAKE 1 TABLET BY MOUTH TWICE DAILY 07/21/13  Yes Lars Masson, MD  Multiple Vitamin (MULTIVITAMIN) tablet Take 1 tablet by mouth daily.     Yes Historical Provider, MD  SPIRIVA HANDIHALER 18 MCG inhalation capsule INHALE CONTENTS OF 1 CAPSULE ONCE EVERY DAY AS DIRECTED 08/07/13  Yes Tammy S Parrett, NP  SYMBICORT 160-4.5 MCG/ACT inhaler INHALE 2 PUFFS INTO THE LUNGS 2 (TWO) TIMES DAILY. 08/21/13  Yes Leslye Peer, MD  vitamin C (ASCORBIC ACID) 500 MG tablet Take 500 mg by mouth 2 (two) times daily.   Yes Historical Provider, MD   Family History  Family History  Problem Relation Age of Onset  . Coronary artery disease      family hx of female 1st. degree relative <50/family hx of 1st. degree relative <60  . Emphysema Father   . Heart disease Father   . Stomach cancer Father   . Emphysema      3 sisters. they were smokers  . Stomach cancer Sister   . Emphysema Sister   . Heart disease Mother   . Coronary artery disease Brother     Social History  History   Social History  . Marital Status: Married    Spouse Name: N/A    Number of Children: 4  . Years of Education: N/A   Occupational History  . retired     [employer]   Social History Main Topics  . Smoking status: Former Smoker -- 1.00 packs/day for 49 years    Types: Cigarettes    Quit date: 07/11/2005  . Smokeless tobacco: Never Used     Comment: smoked for 48 years 1 ppd  . Alcohol Use: No  . Drug Use: No  . Sexual Activity: Not on file   Other Topics Concern  . Not on file   Social History Narrative  . No narrative on file    Review of Systems, as per HPI, otherwise negative General:  No chills, fever, night sweats or weight changes.  Cardiovascular:  No chest  pain, dyspnea on exertion, edema, orthopnea, palpitations, paroxysmal nocturnal dyspnea. Dermatological: No rash, lesions/masses Respiratory: No cough, dyspnea Urologic: No hematuria, dysuria Abdominal:   No nausea, vomiting, diarrhea, bright red blood per rectum, melena, or hematemesis Neurologic:  No visual changes, wkns, changes in mental status. All other systems reviewed and are otherwise negative except as noted above.  Physical Exam  Blood pressure 126/66, pulse 65, height  (1.549 m), weight 128 lb (58.06 kg).  General: Pleasant, NAD Psych: Normal affect. Neuro: Alert and oriented X 3. Moves all extremities spontaneously. HEENT: Normal  Neck: Supple without bruits or JVD. Lungs:  Resp regular and unlabored, CTA. Heart: RRR no s3, s4, or murmurs. Abdomen: Soft, non-tender, non-distended, BS + x 4.  Extremities: No clubbing, mild bilateral cyanosis, mild B/L edema around ankles. Radials 2+  and equal bilaterally. DP/PT + 1 on the left, not palpable on the right.  Labs:  No results found for this basename: CKTOTAL, CKMB, TROPONINI,  in the last 72 hours Lab Results  Component Value Date   WBC 11.2* 04/19/2012   HGB 14.0 04/19/2012   HCT 40.5 04/19/2012   MCV 92.9 04/19/2012   PLT 349 04/19/2012    No results found for this basename: DDIMER   No components found with this basename: POCBNP,     Component Value Date/Time   NA 134* 04/20/2012 0605   K 4.5 04/20/2012 0605   CL 91* 04/20/2012 0605   CO2 32 04/20/2012 0605   GLUCOSE 120* 04/20/2012 0605   BUN 20 04/20/2012 0605   CREATININE 0.76 04/20/2012 0605   CALCIUM 9.9 04/20/2012 0605   PROT 7.1 04/17/2012 0655   ALBUMIN 3.5 04/17/2012 0655   AST 20 04/17/2012 0655   ALT 16 04/17/2012 0655   ALKPHOS 60 04/17/2012 0655   BILITOT 0.3 04/17/2012 0655   GFRNONAA 81* 04/20/2012 0605   GFRAA >90 04/20/2012 0605   Lab Results  Component Value Date   CHOL 213 12/16/2008   HDL 62 12/16/2008   LDLCALC 161 12/16/2008    Accessory Clinical Findings  Echocardiogram - 04/18/2014 Study Conclusions  - Left ventricle: The cavity size was normal. There was mild focal basal hypertrophy of the septum. The estimated ejection fraction was in the range of 70% to 75%. There was dynamic obstruction, with mid-cavity obliteration. Wall motion was normal; there were no regional wall motion abnormalities. Doppler parameters are consistent with abnormal left ventricular relaxation (grade 1 diastolic dysfunction). - Mitral valve: Calcified annulus. - Atrial septum: There was increased thickness of the septum, consistent with lipomatous hypertrophy.  ECG - sinus bradycardia, 55 beats per minute, early repolarization, otherwise normal EKG  Lexiscan nuclear stress test: 02/17/2014 Quantitative Gated Spect Images  QGS EDV: 58 ml  QGS ESV: 17 ml  Impression  Exercise Capacity: Lexiscan with no exercise.  BP Response: Hypertensive blood pressure response.  Clinical Symptoms: There is dyspnea.  ECG Impression: No significant ST segment change suggestive of ischemia.  Comparison with Prior Nuclear Study: Compared to 08/06/12, no change.  Overall Impression: Normal stress nuclear study.  LV Ejection Fraction: 71%. LV Wall Motion: NL LV Function; NL Wall Motion  B/L Lower extremity arterial Duplex 02/17/2014 No evidence of segmental lower extremity arterial disease at rest, bilaterally. Normal ABI's, bilaterally. Abnormal great toe-brachial indices, bilaterally    Assessment & Plan  1. Worsening dyspnea on exertion - this is most probably multifactorial secondary to COPD on home O2 therapy and hypertrophic cardiomyopathy. She needs to be continued on a blockers.  Nuclear stress test negative for prior scar or ischemia.  2. Hypertension - controlled  3. COPD - on home O2 therapy. No recent exacerbation.  4. Peripheral arterial disease - bluish discoloration of her lower extremities with decreased pulses bilaterally.  Normal ABIs B/L, abnormal  great toe-brachial indices B/L.   5. Coronary calcification on CT - Continue ASA, beta blocker and statin.  6. B/L LE edema - mild - start compression stockings  Followup in 1 year.  Lars Masson, MD, [facility] 04/07/2014, 11:16 AM

## 2014-04-16 ENCOUNTER — Telehealth: Payer: Self-pay | Admitting: Emergency Medicine

## 2014-04-16 DIAGNOSIS — R918 Other nonspecific abnormal finding of lung field: Secondary | ICD-10-CM

## 2014-04-17 NOTE — Telephone Encounter (Signed)
I called & spoke with patient, gave her CT appt info (05/08/14 arrival @10 :45 am) @ [facility] on [address], gave her address & phone number for that office Lucilla Edinawne J Law

## 2014-04-17 NOTE — Telephone Encounter (Signed)
[phone] returning call

## 2014-04-17 NOTE — Telephone Encounter (Signed)
Per 09/13/13 OV w/ RB: You will need a repeat CT scan in October 2015   [facility] RB. CT scan ordered in EPIC.

## 2014-04-17 NOTE — Telephone Encounter (Signed)
Called spoke with pt. She is scheduled to see RB 05/13/14 at 12. [facility] so they are aware to scheduled pt CT scan prior to this

## 2014-04-25 ENCOUNTER — Other Ambulatory Visit: Payer: Self-pay

## 2014-05-08 ENCOUNTER — Ambulatory Visit (INDEPENDENT_AMBULATORY_CARE_PROVIDER_SITE_OTHER)
Admission: RE | Admit: 2014-05-08 | Discharge: 2014-05-08 | Disposition: A | Discharge status: Home / Self Care | Payer: Medicare Other | Source: Ambulatory Visit | Attending: Emergency Medicine | Admitting: Emergency Medicine

## 2014-05-08 DIAGNOSIS — R918 Other nonspecific abnormal finding of lung field: Secondary | ICD-10-CM

## 2014-05-13 ENCOUNTER — Encounter: Payer: Self-pay | Admitting: Emergency Medicine

## 2014-05-13 ENCOUNTER — Ambulatory Visit (INDEPENDENT_AMBULATORY_CARE_PROVIDER_SITE_OTHER): Payer: Medicare Other | Admitting: Emergency Medicine

## 2014-05-13 VITALS — BP 128/70 | HR 67 | Temp 97.3°F | Ht 61.0 in | Wt 131.2 lb

## 2014-05-13 DIAGNOSIS — R918 Other nonspecific abnormal finding of lung field: Secondary | ICD-10-CM

## 2014-05-13 DIAGNOSIS — J449 Chronic obstructive pulmonary disease, unspecified: Secondary | ICD-10-CM

## 2014-05-13 NOTE — Progress Notes (Signed)
Subjective:     Patient ID: [name], female   DOB: 13-Dec-1934, 79 y.o.   MRN: [id number]  HPI 79 yo woman, former smoker (50 pk-yrs), hx COPD, HTN and diastolic CHF, PVD. She was admitted for AE-COPD 10/7-10/11. CT scan during that admission showed scattered LL nodular disease. She returns for f/u, has been taking symbicort and spiriva. This was her 2nd hospitalization for AE. She is now feeling almost back to baseline, still with DOE - hasn't been shopping by herself. Her FEV1 was 0.57L in 07/2010. Very little cough, sputum. She does occasionally wheeze.  She uses SABA several times a week.   ROV 08/07/12 -- Follows up for her severe COPD, associated hypoxemia, frequent exacerbations. Has been managed on Spiriva + Symbicort. O2 2L/min pulsed w exertion >> she increased to 3L/min on her own. Also LL pulm nodular dizease by CT scan, due for repeat April 2014. She began to have chest tightness, cough, chest burning over the weekend (4-5 days ago). She started an old script of pred on her own > a taper that she had from before. Her chest tightness and breathing have improved. Rare cough, clear mucous.   08/17/12 Follow up  Present for a follow up for COPD flare .  Pt saw Cardiology on Tuesday-2/4  and they ordered avelox and prednisone. Pt states  cough is improved, but she is still having  increased SOB with rest and minimal activity. She denies any hemoptysis, orthopnea, PND, or leg swelling Chest x-ray is without acute process.  ROV 09/13/12 -- Follows up for her severe COPD, associated hypoxemia, frequent exacerbations. Was treated for an AE-COPD last month with avelox and pred taper. She is feeling better - back to baseline. Wearing o2 at 2-3L/min. Using a walker to ambulate. No wheeze or sputum. Using SABA 2-3x a day.   ROV 11/08/12 -- severe COPD, associated hypoxemia, frequent exacerbations. Also with scattered B pulm nodules identified on CT scan 04/2012 predominantly RUL. She returns today after  CT scan 4/4 >> stable. She is asking about the coronary plaque seen on the CT scan. She denies CP.  She is stable on spiriva + symbicort. No exacerbations. No new sx  ROV 02/07/13 -- severe COPD, associated hypoxemia, frequent exacerbations. Also with scattered B pulm nodules identified on CT scan 04/2012 predominantly RUL, stable 10/12/12. She has been doing well, no flares. Uses albuterol most days. She wears o2 with exertion, 2l/min.   ROV 05/06/13 -- severe COPD, associated hypoxemia, frequent exacerbations. She [facility] Also with scattered B pulm nodules identified on CT scan 04/2012 predominantly RUL, stable 10/12/12. Repeat CT done 05/02/13 shows stability of nodules, new irregular area of GG and consolidation.  She has been well, denies any PNA or exacerbations. No cough. She does have exertional dyspnea. Uses SABA 1-2x a day.   ROV 08/16/13 -- severe COPD, associated hypoxemia, frequent exacerbations. Pulmonary nodules and some GGI being followed by CT scan.  She started having some dry cough 1/28, was seen by her PCP and started Augmentin, has 2 more days to go. Was not treated with pred. Remains on symbicort, spiriva  ROV 09/13/13 -- severe COPD, associated hypoxemia, frequent exacerbations. We have also ben following some pulm nodules and GGI, last CT scan chest was 08/05/13 and showed stability. She need repeat CT in October. She has recovered from recent AE, treated with augmentin and pred.   ROV 05/13/14 -- follow up visit for severe COPD,  associated hypoxemia, pulmonary nodules. Her CT on 10/29 shows that her previously identified pulmonary nodules are unchanged and have been stable since 04/18/12 consistent with benign etiology. There is a new 1 cm sub-solid nodule in the right upper lobe. She tells me today that her exertion is limited. She is on 2L/min pulsed. Remains on spiriva and symbicort - the Anoro did not help as much.      Objective:   Physical  Exam Filed Vitals:   05/13/14 1210  BP: 128/70  Pulse: 67  Temp: 97.3 F (36.3 C)  TempSrc: Oral  Height: 5\' 1"  (1.549 m)  Weight: 131 lb 3.2 oz (59.512 kg)  SpO2: 98%   Gen: Pleasant, elderly woman, in no distress,  normal affect  ENT: No lesions,  mouth clear,  oropharynx clear, no postnasal drip  Neck: No JVD, no TMG, no carotid bruits  Lungs: No use of accessory muscles, distant, diminished BS in bases, no wheeze  Cardiovascular: RRR, heart sounds normal, no murmur or gallops, no peripheral edema  Musculoskeletal: No deformities, no cyanosis or clubbing  Neuro: alert, non focal  Skin: Warm, no lesions or rashes   CT scan 08/05/13 --  .COMPARISON: 05/02/2013 and 04/18/2012  FINDINGS:  Moderate emphysema again demonstrated. Scarring in the lateral right  upper lobe has a less bulky appearance than on previous study.  Scattered pulmonary nodules are again seen within both lungs, which  remain stable compared to previous studies. Largest nodule is seen  in the posterior right upper lobe measuring 8 mm on image 25.  No evidence of pulmonary infiltrate or central endobronchial  obstruction. No evidence of pleural or pericardial effusion. No  evidence of hilar or mediastinal masses. No adenopathy seen  elsewhere within the thorax. No evidence of chest wall mass or  suspicious bone lesions. Both adrenal glands are normal in  appearance.  IMPRESSION:  Stable bilateral pulmonary nodules, largest measuring 8 mm in right  upper lobe. Continued followup by CT recommended in 9 months, to  confirm stability over at least 2 years since original exam on  04/18/2012.  Moderate emphysema.       Assessment:     COPD (chronic obstructive pulmonary disease) - we will try a sample of stiolto, then continue spiriva and symbicort. Assess which regimen is regiimen is better.  - walking oximetry on 2L pulsed - PAULAS test today - repeat CT chest 6 months - rov 3   Multiple lung  nodules - her chronic nodules are stable and have been stable since 2013. There is a new 0.9 cm nodule in the right upper lobe that will need to be followed. - Repeat CT scan of the chest in 6 months - PAULAs test today

## 2014-05-13 NOTE — Assessment & Plan Note (Signed)
-   her chronic nodules are stable and have been stable since 2013. There is a new 0.9 cm nodule in the right upper lobe that will need to be followed. - Repeat CT scan of the chest in 6 months - PAULAs test today

## 2014-05-13 NOTE — Assessment & Plan Note (Signed)
-   we will try a sample of stiolto, then continue spiriva and symbicort. Assess which regimen is regiimen is better.  - walking oximetry on 2L pulsed - PAULAS test today - repeat CT chest 6 months - rov 3

## 2014-05-13 NOTE — Patient Instructions (Signed)
Try using Stiolto 2 puffs twice a day to see if it helps you more than your current regimen. Then go back to your usual Spiriva and Symbicort.  Your oxygen level is dropping on pulsed oxygen. You need to increase to 4L/min pulsed when you are using your portable concentrator. If this is not enough we may need to consider going to continuous oxygen in the future Blood work today Follow with Dr Delton CoombesByrum in 1 month We will repeat your CT scan of the chest in 6 months.

## 2014-05-14 ENCOUNTER — Telehealth: Payer: Self-pay | Admitting: Emergency Medicine

## 2014-05-14 DIAGNOSIS — J441 Chronic obstructive pulmonary disease with (acute) exacerbation: Secondary | ICD-10-CM

## 2014-05-14 NOTE — Telephone Encounter (Signed)
[facility] They are not able to do this. If RB feels she needs this, then we will need to place order (dme wheelchair--fill out the needed information in that order) If he is asking for pt to be assessed for a power wheelchair they can assess for this.  Please advise RB thanks

## 2014-05-14 NOTE — Telephone Encounter (Signed)
Reordered DME referral.  Nothing further needed.

## 2014-05-14 NOTE — Telephone Encounter (Signed)
I wanted her to have a home health assessment for any needs - wheelchair, other equipment etc

## 2014-05-14 NOTE — Telephone Encounter (Signed)
Needs a home health needs assessment

## 2014-05-15 ENCOUNTER — Telehealth: Payer: Self-pay | Admitting: Emergency Medicine

## 2014-05-15 NOTE — Telephone Encounter (Signed)
lmtcb for Melissa.  

## 2014-05-15 NOTE — Telephone Encounter (Signed)
I [facility] the [facility] & [facility] to see if [facility] accommodate this patient's needs Lucilla Edinawne J Law

## 2014-05-15 NOTE — Telephone Encounter (Signed)
[name] called back w/ [facility]. She reports staffing for nursing since they are booked up. PCC's can this be sent somewhere else? thanks

## 2014-05-27 ENCOUNTER — Telehealth: Payer: Self-pay | Admitting: Emergency Medicine

## 2014-05-27 NOTE — Telephone Encounter (Signed)
Called and spoke with pt and she stated that when she was seen by RB on 11/3 she stated that she was given a sample of stiolto and was told to take 2 puffs bid  And she was worried about this since the manufacturer says take this only once.  How should she be taking this medication?  Pt stated that she does not want to OD on this medication.  Pt also stated that at her last OV she stated that RB wanted her to have labs done but the lab could not figure out how to fix the forms for this bloodwork, so this was not done and she wanted to make sure that RB still wanted her to come back in 3 wks.  RB please advise. Thanks  Allergies  Allergen Reactions  . Penicillins Hives    Has taken it since.  . Pneumovax [Pneumococcal Polysaccharide Vaccine] Hives    Current Outpatient Prescriptions on File Prior to Visit  Medication Sig Dispense Refill  . albuterol (PROVENTIL HFA;VENTOLIN HFA) 108 (90 BASE) MCG/ACT inhaler Inhale 2 puffs into the lungs every 6 (six) hours as needed. For shortness of breath    . aspirin 81 MG tablet Take 81 mg by mouth daily.      Marland Kitchen. atorvastatin (LIPITOR) 10 MG tablet Take 10 mg by mouth daily.      . calcium-vitamin D (OSCAL WITH D) 500-200 MG-UNIT per tablet Take 1 tablet by mouth 2 (two) times daily.      . famotidine (PEPCID) 20 MG tablet Take 1 tablet 2 times daily for 4 weeks then as needed 60 tablet 2  . metoprolol tartrate (LOPRESSOR) 25 MG tablet Take 25 mg by mouth 2 (two) times daily.    . Multiple Vitamin (MULTIVITAMIN) tablet Take 1 tablet by mouth daily.      Marland Kitchen. SPIRIVA HANDIHALER 18 MCG inhalation capsule INHALE CONTENTS OF 1 CAPSULE ONCE EVERY DAY AS DIRECTED 30 capsule 5  . SYMBICORT 160-4.5 MCG/ACT inhaler INHALE 2 PUFFS INTO THE LUNGS 2 (TWO) TIMES DAILY. 1 Inhaler 2  . vitamin C (ASCORBIC ACID) 500 MG tablet Take 500 mg by mouth 2 (two) times daily.     No current facility-administered medications on file prior to visit.

## 2014-05-29 NOTE — Telephone Encounter (Signed)
This should be 2 puffs qd - please ask her to decrease to that dose.

## 2014-05-30 NOTE — Telephone Encounter (Signed)
Spoke with pt and advised of dose change of Stiolto.  Pt verified understanding.  Advised pt that I will ask Dr Delton CoombesByrum about f/u appt and get back with her. Dr Delton CoombesByrum, please advise if you still want to see pt on 06/10/14 since labs (PAULAs) were unable to be done at last ov.

## 2014-05-30 NOTE — Telephone Encounter (Signed)
LMTC x 1  

## 2014-05-30 NOTE — Telephone Encounter (Signed)
Yes I would like for her to come and get the test - it could be changed to a nurse visit or a lab visit if that is appropriate (but the PAULAS stuff is in our office).

## 2014-05-30 NOTE — Telephone Encounter (Signed)
Pt returning call.[name] ° °

## 2014-06-02 NOTE — Telephone Encounter (Signed)
Pt will have this test done at her 12/1 visit.

## 2014-06-10 ENCOUNTER — Ambulatory Visit: Payer: Medicare Other | Admitting: Emergency Medicine

## 2014-06-10 ENCOUNTER — Other Ambulatory Visit: Payer: Medicare Other

## 2014-07-08 ENCOUNTER — Telehealth: Payer: Self-pay | Admitting: Emergency Medicine

## 2014-07-08 MED ORDER — TIOTROPIUM BROMIDE MONOHYDRATE 18 MCG IN CAPS: ORAL_CAPSULE | RESPIRATORY_TRACT | Status: DC

## 2014-07-08 MED ORDER — BUDESONIDE-FORMOTEROL FUMARATE 160-4.5 MCG/ACT IN AERO: INHALATION_SPRAY | RESPIRATORY_TRACT | Status: DC

## 2014-07-08 NOTE — Telephone Encounter (Signed)
Samples have been left up front for the pt to pick up and she is aware and nothing further is needed.

## 2014-09-29 ENCOUNTER — Encounter: Payer: Self-pay | Admitting: Emergency Medicine

## 2014-10-29 ENCOUNTER — Telehealth: Payer: Self-pay | Admitting: Emergency Medicine

## 2014-10-29 MED ORDER — BUDESONIDE-FORMOTEROL FUMARATE 160-4.5 MCG/ACT IN AERO: INHALATION_SPRAY | RESPIRATORY_TRACT | Status: DC

## 2014-10-29 MED ORDER — TIOTROPIUM BROMIDE MONOHYDRATE 18 MCG IN CAPS: ORAL_CAPSULE | RESPIRATORY_TRACT | Status: DC

## 2014-10-29 NOTE — Telephone Encounter (Signed)
rx sent to pharmacy.  Nothing further needed.  

## 2014-10-31 ENCOUNTER — Telehealth: Payer: Self-pay | Admitting: Emergency Medicine

## 2014-10-31 NOTE — Telephone Encounter (Signed)
Prescriptions called into [facility] in [place] per patient's request. Patient notified. Nothing further needed.

## 2014-10-31 NOTE — Telephone Encounter (Signed)
Prescription was sent to [facility] in [place] instead of [facility].  Patient notified. Nothing further needed.

## 2014-11-26 ENCOUNTER — Ambulatory Visit (INDEPENDENT_AMBULATORY_CARE_PROVIDER_SITE_OTHER)
Admission: RE | Admit: 2014-11-26 | Discharge: 2014-11-26 | Disposition: A | Discharge status: Home / Self Care | Payer: PPO | Source: Ambulatory Visit | Attending: Emergency Medicine | Admitting: Emergency Medicine

## 2014-11-26 DIAGNOSIS — R918 Other nonspecific abnormal finding of lung field: Secondary | ICD-10-CM | POA: Diagnosis not present

## 2014-11-26 DIAGNOSIS — J449 Chronic obstructive pulmonary disease, unspecified: Secondary | ICD-10-CM | POA: Diagnosis not present

## 2014-12-02 ENCOUNTER — Encounter: Payer: Self-pay | Admitting: Emergency Medicine

## 2014-12-02 ENCOUNTER — Ambulatory Visit (INDEPENDENT_AMBULATORY_CARE_PROVIDER_SITE_OTHER): Payer: PPO | Admitting: Emergency Medicine

## 2014-12-02 VITALS — BP 122/78 | HR 65 | Wt 130.0 lb

## 2014-12-02 DIAGNOSIS — J449 Chronic obstructive pulmonary disease, unspecified: Secondary | ICD-10-CM | POA: Diagnosis not present

## 2014-12-02 DIAGNOSIS — R918 Other nonspecific abnormal finding of lung field: Secondary | ICD-10-CM

## 2014-12-02 NOTE — Patient Instructions (Addendum)
We will change your Spiriva to the Respimat version, 2 puffs daily Please continue Symbicort 2 puffs twice a day. Remember to rinse and gargle after he take this medication. You may want to start taking it before your Spiriva.  Continue your oxygen at 3 L/m Your CT scan of the chest shows that you pulmonary nodules are stable. The new pulmonary nodule in the right upper lobe is no longer seen. You should not need any repeat scans unless there is a clinical change.  Follow with Dr Delton CoombesByrum in 4 months or sooner if you have any problems.

## 2014-12-02 NOTE — Progress Notes (Signed)
Subjective:     Patient ID: [name], female   DOB: 1934/12/03, 80 y.o.   MRN: [id number]  HPI 80 yo woman, former smoker (50 pk-yrs), hx COPD, HTN and diastolic CHF, PVD. She was admitted for AE-COPD 10/7-10/11. CT scan during that admission showed scattered LL nodular disease. She returns for f/u, has been taking symbicort and spiriva. This was her 2nd hospitalization for AE. She is now feeling almost back to baseline, still with [employer] - hasn't been shopping by herself. Her FEV1 was 0.57L in 07/2010. Very little cough, sputum. She does occasionally wheeze.  She uses SABA several times a week.   ROV 08/07/12 -- Follows up for her severe COPD, associated hypoxemia, frequent exacerbations. Has been managed on Spiriva + Symbicort. O2 2L/min pulsed w exertion >> she increased to 3L/min on her own. Also LL pulm nodular dizease by CT scan, due for repeat April 2014. She began to have chest tightness, cough, chest burning over the weekend (4-5 days ago). She started an old script of pred on her own > a taper that she had from before. Her chest tightness and breathing have improved. Rare cough, clear mucous.   08/17/12 Follow up  Present for a follow up for COPD flare .  Pt saw Cardiology on Tuesday-2/4  and they ordered avelox and prednisone. Pt states  cough is improved, but she is still having  increased SOB with rest and minimal activity. She denies any hemoptysis, orthopnea, PND, or leg swelling Chest x-ray is without acute process.  ROV 09/13/12 -- Follows up for her severe COPD, associated hypoxemia, frequent exacerbations. Was treated for an AE-COPD last month with avelox and pred taper. She is feeling better - back to baseline. Wearing o2 at 2-3L/min. Using a walker to ambulate. No wheeze or sputum. Using SABA 2-3x a day.   ROV 11/08/12 -- severe COPD, associated hypoxemia, frequent exacerbations. Also with scattered B pulm nodules identified on CT scan 04/2012 predominantly RUL. She returns today after  CT scan 4/4 >> stable. She is asking about the coronary plaque seen on the CT scan. She denies CP.  She is stable on spiriva + symbicort. No exacerbations. No new sx  ROV 02/07/13 -- severe COPD, associated hypoxemia, frequent exacerbations. Also with scattered B pulm nodules identified on CT scan 04/2012 predominantly RUL, stable 10/12/12. She has been doing well, no flares. Uses albuterol most days. She wears o2 with exertion, 2l/min.   ROV 05/06/13 -- severe COPD, associated hypoxemia, frequent exacerbations. She completed pulm rehab at [facility] since last time. Also with scattered B pulm nodules identified on CT scan 04/2012 predominantly RUL, stable 10/12/12. Repeat CT done 05/02/13 shows stability of nodules, new irregular area of GG and consolidation.  She has been well, denies any PNA or exacerbations. No cough. She does have exertional dyspnea. Uses SABA 1-2x a day.   ROV 08/16/13 -- severe COPD, associated hypoxemia, frequent exacerbations. Pulmonary nodules and some GGI being followed by CT scan.  She started having some dry cough 1/28, was seen by her PCP and started Augmentin, has 2 more days to go. Was not treated with pred. Remains on symbicort, spiriva  ROV 09/13/13 -- severe COPD, associated hypoxemia, frequent exacerbations. We have also ben following some pulm nodules and GGI, last CT scan chest was 08/05/13 and showed stability. She need repeat CT in October. She has recovered from recent AE, treated with augmentin and pred.   ROV 05/13/14 -- follow up visit for severe COPD,  associated hypoxemia, pulmonary nodules. Her CT on 10/29 shows that her previously identified pulmonary nodules are unchanged and have been stable since 04/18/12 consistent with benign etiology. There is a new 1 cm sub-solid nodule in the right upper lobe. She tells me today that her exertion is limited. She is on 2L/min pulsed. Remains on spiriva and symbicort - the Anoro did not help as much.   ROV 12/02/14 --  Follow for COPD,  hypoxemia. She has pulm nodules on CT chest. PAULAs test since last time was reassuring. CT chest from 5/18 reviewed by me > the new sub solid RUL nodule is gone, all the other nodules are stable for > 2 years. She has been having more exertional dyspnea, the sensation that she cannot get a deep breath. We changed her oxygen to 3 L/m with exertion last time and she's been reliable with this. No real change in coughing or wheezing. No acute exacerbations     Objective:   Physical Exam Filed Vitals:   12/02/14 1100 12/02/14 1102  BP:  122/78  Pulse:  65  Weight: 130 lb (58.968 kg)   SpO2:  99%   Gen: Pleasant, elderly woman, in no distress,  normal affect  ENT: No lesions,  mouth clear,  oropharynx clear, no postnasal drip  Neck: No JVD, no TMG, no carotid bruits  Lungs: No use of accessory muscles, distant, diminished BS in bases, no wheeze  Cardiovascular: RRR, heart sounds normal, no murmur or gallops, no peripheral edema  Musculoskeletal: No deformities, no cyanosis or clubbing  Neuro: alert, non focal  Skin: Warm, no lesions or rashes    CT scan chest , personally reviewed by me 11/26/14 --  COMPARISON: CT 05/08/2014 04/18/2012  FINDINGS: Mediastinum/Nodes: No axillary supraclavicular lymphadenopathy. No mediastinal hilar lymphadenopathy no pericardial fluid. Esophagus is normal.  Lungs/Pleura: The new sub solid nodule described on comparison CT is no longer measurable.  Additional solid nodules are not changed. For example 7 mm nodule in the right upper lobe on image 27, series 3 is unchanged. Adjacent 6 mm nodule is also unchanged (image 26).  More superiorly in the right upper lobe band of linear nodular thickening measures 5 mm compared to 7 mm (image 11, series 3. This right upper lobe linear nodularity first appear CT of 05/02/2013 and has lengthed and thinned.  There is additional scattered sub 5 mm nodules, stable. 3 mm left upper lobe nodule image  17, unchanged  Upper abdomen: Limited view of the liver, kidneys, pancreas are unremarkable. Normal adrenal glands.  Musculoskeletal: No aggressive osseous lesion.  IMPRESSION: 1. Resolution of right upper lobe sub solid nodule. 2. Stable bilateral noncalcified nodules for greater than 2 years. 3. Band of linear nodular thickening at the right lung apex first presented on CT of 05/02/2013. This nodular thickening appears to continue to decrease in volume. Consider one additional follow-up for this right upper lobe bandlike nodular thickening in 6-12 months       Assessment:     COPD (chronic obstructive pulmonary disease) This been some slow clinical worsening with more exertional dyspnea. She did not benefit from [place] and is back on Spiriva and Symbicort. We recently checked her walking oximetry and increased her to 3 L/m. I would like to change her Spiriva to the Respimat version, have her take the Symbicort first in the am, see if this helps with medication delivery. I will follow with her in 4 months. If she continues to have progressive symptoms we may  need to consider a trial of nebulized therapy. She also may benefit from pulmonary rehabilitation to discuss this next time.    Multiple lung nodules Her solid pulmonary nodules have been stable for 2 years. The worrisome right upper lobe semisolid nodule in question has disappeared on the new scan. This is very reassuring and I explained the results to the patient and her husband. We should not need to do any further scanning unless there is a clinical change.

## 2014-12-02 NOTE — Assessment & Plan Note (Signed)
This been some slow clinical worsening with more exertional dyspnea. She did not benefit from [facility] and is back on Spiriva and Symbicort. We recently checked her walking oximetry and increased her to 3 L/m. I would like to change her Spiriva to the Respimat version, have her take the Symbicort first in the am, see if this helps with medication delivery. I will follow with her in 4 months. If she continues to have progressive symptoms we may need to consider a trial of nebulized therapy. She also may benefit from pulmonary rehabilitation to discuss this next time.

## 2014-12-02 NOTE — Assessment & Plan Note (Signed)
Her solid pulmonary nodules have been stable for 2 years. The worrisome right upper lobe semisolid nodule in question has disappeared on the new scan. This is very reassuring and I explained the results to the patient and her husband. We should not need to do any further scanning unless there is a clinical change.

## 2015-01-05 ENCOUNTER — Other Ambulatory Visit: Payer: Self-pay

## 2015-01-05 ENCOUNTER — Telehealth: Payer: Self-pay | Admitting: Emergency Medicine

## 2015-01-05 MED ORDER — BUDESONIDE-FORMOTEROL FUMARATE 160-4.5 MCG/ACT IN AERO: INHALATION_SPRAY | RESPIRATORY_TRACT | Status: DC

## 2015-01-05 MED ORDER — TIOTROPIUM BROMIDE MONOHYDRATE 2.5 MCG/ACT IN AERS: 2.0000 | INHALATION_SPRAY | Freq: Every day | RESPIRATORY_TRACT | Status: DC

## 2015-01-05 NOTE — Telephone Encounter (Signed)
Both rx's have been sent in. Pt is aware. Nothing further was needed. 

## 2015-05-12 ENCOUNTER — Encounter: Payer: Self-pay | Admitting: Emergency Medicine

## 2015-05-12 ENCOUNTER — Ambulatory Visit (INDEPENDENT_AMBULATORY_CARE_PROVIDER_SITE_OTHER): Payer: PPO | Admitting: Emergency Medicine

## 2015-05-12 VITALS — BP 104/60 | HR 68 | Ht 61.0 in | Wt 128.0 lb

## 2015-05-12 DIAGNOSIS — J449 Chronic obstructive pulmonary disease, unspecified: Secondary | ICD-10-CM | POA: Diagnosis not present

## 2015-05-12 MED ORDER — ALBUTEROL SULFATE (2.5 MG/3ML) 0.083% IN NEBU: 2.5000 mg | INHALATION_SOLUTION | Freq: Four times a day (QID) | RESPIRATORY_TRACT | Status: DC | PRN

## 2015-05-12 NOTE — Progress Notes (Signed)
Subjective:     Patient ID: [name], female   DOB: 1934/12/03, 80 y.o.   MRN: [id number]  HPI 80 yo woman, former smoker (50 pk-yrs), hx COPD, HTN and diastolic CHF, PVD. She was admitted for AE-COPD 10/7-10/11. CT scan during that admission showed scattered LL nodular disease. She returns for f/u, has been taking symbicort and spiriva. This was her 2nd hospitalization for AE. She is now feeling almost back to baseline, still with [employer] - hasn't been shopping by herself. Her FEV1 was 0.57L in 07/2010. Very little cough, sputum. She does occasionally wheeze.  She uses SABA several times a week.   ROV 08/07/12 -- Follows up for her severe COPD, associated hypoxemia, frequent exacerbations. Has been managed on Spiriva + Symbicort. O2 2L/min pulsed w exertion >> she increased to 3L/min on her own. Also LL pulm nodular dizease by CT scan, due for repeat April 2014. She began to have chest tightness, cough, chest burning over the weekend (4-5 days ago). She started an old script of pred on her own > a taper that she had from before. Her chest tightness and breathing have improved. Rare cough, clear mucous.   08/17/12 Follow up  Present for a follow up for COPD flare .  Pt saw Cardiology on Tuesday-2/4  and they ordered avelox and prednisone. Pt states  cough is improved, but she is still having  increased SOB with rest and minimal activity. She denies any hemoptysis, orthopnea, PND, or leg swelling Chest x-ray is without acute process.  ROV 09/13/12 -- Follows up for her severe COPD, associated hypoxemia, frequent exacerbations. Was treated for an AE-COPD last month with avelox and pred taper. She is feeling better - back to baseline. Wearing o2 at 2-3L/min. Using a walker to ambulate. No wheeze or sputum. Using SABA 2-3x a day.   ROV 11/08/12 -- severe COPD, associated hypoxemia, frequent exacerbations. Also with scattered B pulm nodules identified on CT scan 04/2012 predominantly RUL. She returns today after  CT scan 4/4 >> stable. She is asking about the coronary plaque seen on the CT scan. She denies CP.  She is stable on spiriva + symbicort. No exacerbations. No new sx  ROV 02/07/13 -- severe COPD, associated hypoxemia, frequent exacerbations. Also with scattered B pulm nodules identified on CT scan 04/2012 predominantly RUL, stable 10/12/12. She has been doing well, no flares. Uses albuterol most days. She wears o2 with exertion, 2l/min.   ROV 05/06/13 -- severe COPD, associated hypoxemia, frequent exacerbations. She completed pulm rehab at [facility] since last time. Also with scattered B pulm nodules identified on CT scan 04/2012 predominantly RUL, stable 10/12/12. Repeat CT done 05/02/13 shows stability of nodules, new irregular area of GG and consolidation.  She has been well, denies any PNA or exacerbations. No cough. She does have exertional dyspnea. Uses SABA 1-2x a day.   ROV 08/16/13 -- severe COPD, associated hypoxemia, frequent exacerbations. Pulmonary nodules and some GGI being followed by CT scan.  She started having some dry cough 1/28, was seen by her PCP and started Augmentin, has 2 more days to go. Was not treated with pred. Remains on symbicort, spiriva  ROV 09/13/13 -- severe COPD, associated hypoxemia, frequent exacerbations. We have also ben following some pulm nodules and GGI, last CT scan chest was 08/05/13 and showed stability. She need repeat CT in October. She has recovered from recent AE, treated with augmentin and pred.   ROV 05/13/14 -- follow up visit for severe COPD,  associated hypoxemia, pulmonary nodules. Her CT on 10/29 shows that her previously identified pulmonary nodules are unchanged and have been stable since 04/18/12 consistent with benign etiology. There is a new 1 cm sub-solid nodule in the right upper lobe. She tells me today that her exertion is limited. She is on 2L/min pulsed. Remains on spiriva and symbicort - the Anoro did not help as much.   ROV 12/02/14 --  Follow for COPD,  hypoxemia. She has pulm nodules on CT chest. PAULAs test since last time was reassuring. CT chest from 5/18 reviewed by me > the new sub solid RUL nodule is gone, all the other nodules are stable for > 2 years. She has been having more exertional dyspnea, the sensation that she cannot get a deep breath. We changed her oxygen to 3 L/m with exertion last time and she's been reliable with this. No real change in coughing or wheezing. No acute exacerbations  ROV 05/12/15 -- follow-up visit for COPD, hypoxemia. She has also had pulmonary nodules that we have followed with serial CT scans and which have been benign for greater than 2 years. She underwent PAULAs testing that was reassuring. At her last version we change Spiriva to respimat. She is also on Symbicort. She is using oxygen at 3 L/m. She is having dyspnea w exertion, is doing a bit less.   No cough, occasional wheeze. She is quite limited, can walk through the house. She believes that she is using her albuterol more.    CAT Score 05/12/2015 05/13/2014 09/13/2012 08/07/2012  Total CAT Score 18 18 17 16        Objective:   Physical Exam Filed Vitals:   05/12/15 1525  BP: 104/60  Pulse: 68  Height: 5\' 1"  (1.549 m)  Weight: 128 lb (58.06 kg)  SpO2: 98%   Gen: Pleasant, elderly woman, in no distress,  normal affect  ENT: No lesions,  mouth clear,  oropharynx clear, no postnasal drip  Neck: No JVD, no TMG, no carotid bruits  Lungs: No use of accessory muscles, distant, diminished BS in bases, no wheeze  Cardiovascular: RRR, heart sounds normal, no murmur or gallops, no peripheral edema  Musculoskeletal: No deformities, no cyanosis or clubbing  Neuro: alert, non focal  Skin: Warm, no lesions or rashes    CT scan chest , personally reviewed by me 11/26/14 --  COMPARISON: CT 05/08/2014 04/18/2012  FINDINGS: Mediastinum/Nodes: No axillary supraclavicular lymphadenopathy. No mediastinal hilar lymphadenopathy no pericardial fluid.  Esophagus is normal.  Lungs/Pleura: The new sub solid nodule described on comparison CT is no longer measurable.  Additional solid nodules are not changed. For example 7 mm nodule in the right upper lobe on image 27, series 3 is unchanged. Adjacent 6 mm nodule is also unchanged (image 26).  More superiorly in the right upper lobe band of linear nodular thickening measures 5 mm compared to 7 mm (image 11, series 3. This right upper lobe linear nodularity first appear CT of 05/02/2013 and has lengthed and thinned.  There is additional scattered sub 5 mm nodules, stable. 3 mm left upper lobe nodule image 17, unchanged  Upper abdomen: Limited view of the liver, kidneys, pancreas are unremarkable. Normal adrenal glands.  Musculoskeletal: No aggressive osseous lesion.  IMPRESSION: 1. Resolution of right upper lobe sub solid nodule. 2. Stable bilateral noncalcified nodules for greater than 2 years. 3. Band of linear nodular thickening at the right lung apex first presented on CT of 05/02/2013. This nodular thickening appears to  continue to decrease in volume. Consider one additional follow-up for this right upper lobe bandlike nodular thickening in 6-12 months       Assessment:     COPD (chronic obstructive pulmonary disease) We will continue current meds. She asked about decreasing the spiriva to one puff daily. I recommended against this, but she may try it. She is a bit more limited, but is on maximal therapy.  Continue O2. Will start albuterol nebs prn. Rov 3  Please continue Spiriva 2 puffs once a day  Continue your Symbicort 2 puffs twice a day Please continue your oxygen at 3L/min.  Flu shot up to date/   We will arrange for a nebulizer to take albuterol up to every 4 hours if needed for shortness of breath Follow with Dr Delton Coombes in 6 months or sooner if you have any problems

## 2015-05-12 NOTE — Assessment & Plan Note (Addendum)
We will continue current meds. She asked about decreasing the spiriva to one puff daily. I recommended against this, but she may try it. She is a bit more limited, but is on maximal therapy.  Continue O2. Will start albuterol nebs prn. Rov 3  Please continue Spiriva 2 puffs once a day  Continue your Symbicort 2 puffs twice a day Please continue your oxygen at 3L/min.  Flu shot up to date/   We will arrange for a nebulizer to take albuterol up to every 4 hours if needed for shortness of breath Follow with Dr Delton CoombesByrum in 6 months or sooner if you have any problems

## 2015-05-12 NOTE — Addendum Note (Signed)
Addended by: Jaynee EaglesLEMONS, LINDSAY C on: 05/12/2015 03:57 PM   Modules accepted: Orders

## 2015-05-12 NOTE — Patient Instructions (Addendum)
Please continue Spiriva 2 puffs once a day  Continue your Symbicort 2 puffs twice a day Please continue your oxygen at 3L/min.  Flu shot up to date/   We will arrange for a nebulizer to take albuterol up to every 4 hours if needed for shortness of breath Follow with Dr Delton CoombesByrum in 6 months or sooner if you have any problems

## 2015-05-14 ENCOUNTER — Telehealth: Payer: Self-pay | Admitting: Emergency Medicine

## 2015-05-14 DIAGNOSIS — J449 Chronic obstructive pulmonary disease, unspecified: Secondary | ICD-10-CM

## 2015-05-14 MED ORDER — ALBUTEROL SULFATE (2.5 MG/3ML) 0.083% IN NEBU: 2.5000 mg | INHALATION_SOLUTION | Freq: Four times a day (QID) | RESPIRATORY_TRACT | Status: DC | PRN

## 2015-05-14 NOTE — Telephone Encounter (Signed)
[facility] calling also to check on this rx and [facility] Rep tomeka [phone]. Please call pt when this has been donetheir # is [phone].Caren GriffinsStanley A Dalton

## 2015-05-14 NOTE — Telephone Encounter (Signed)
Spoke with the pt  She states that [facility]  [facility]  [facility]  Nothing further needed

## 2015-05-14 NOTE — Telephone Encounter (Signed)
[facility] to [facility]  [facility] and call and let the pt know that this was done  Will await this to be signed

## 2015-06-17 ENCOUNTER — Encounter: Payer: Self-pay | Admitting: Cardiology

## 2015-06-17 ENCOUNTER — Ambulatory Visit (INDEPENDENT_AMBULATORY_CARE_PROVIDER_SITE_OTHER): Payer: PPO | Admitting: Cardiology

## 2015-06-17 VITALS — BP 124/60 | HR 91 | Ht 61.0 in | Wt 126.0 lb

## 2015-06-17 DIAGNOSIS — I739 Peripheral vascular disease, unspecified: Secondary | ICD-10-CM

## 2015-06-17 DIAGNOSIS — R0609 Other forms of dyspnea: Secondary | ICD-10-CM | POA: Diagnosis not present

## 2015-06-17 DIAGNOSIS — I10 Essential (primary) hypertension: Secondary | ICD-10-CM

## 2015-06-17 DIAGNOSIS — I421 Obstructive hypertrophic cardiomyopathy: Secondary | ICD-10-CM

## 2015-06-17 DIAGNOSIS — J431 Panlobular emphysema: Secondary | ICD-10-CM | POA: Diagnosis not present

## 2015-06-17 DIAGNOSIS — R0600 Dyspnea, unspecified: Secondary | ICD-10-CM

## 2015-06-17 MED ORDER — NEBIVOLOL HCL 5 MG PO TABS: 5.0000 mg | ORAL_TABLET | Freq: Every day | ORAL | Status: DC

## 2015-06-17 NOTE — Patient Instructions (Signed)
Medication Instructions:   STOP METOPROLOL NOW   START TAKING BYSTOLIC 5 MG ONCE DAILY---DR NELSON GAVE YOU SAMPLES TO TRY OUT---PLEASE CALL OUR OFFICE BACK ONCE YOUR SAMPLES RUN LOW AND TO ALSO LET US KNOW IF THIS MEDICATION IS WORKING FOR YOU, SO WE CAN CALL THIS INTO YOUR PHARMACY.     Follow-Up:  2 MONTHS WITH DR Delton SeeNELSON    If you need a refill on your cardiac medications before your next appointment, please call your pharmacy.

## 2015-06-17 NOTE — Progress Notes (Signed)
Patient ID: [name], female   DOB: 1935/04/23, 80 y.o.   MRN: [id number]    Patient Name: [name] Date of Encounter: 06/17/2015  Primary Care Provider:  [name], MD Primary Cardiologist:  [name] (previously [name])  Problem List   Past Medical History  Diagnosis Date  . PVD (peripheral vascular disease) (HCC)   . Actinomycosis, cervicofacial   . Emphysema   . COPD (chronic obstructive pulmonary disease) (HCC)   . HTN (hypertension)   . Hx of echocardiogram     Echocardiogram 04/16/12: Mild focal basal septal hypertrophy, EF 70-75% with dynamic obstruction with mid cavity obliteration, grade 1 diastolic dysfunction, MAC, atrial septal thickening with findings consistent with lipomatous hypertrophy  . Hyperlipidemia   . CAD (coronary artery disease)     a. coronary calcification on prior Chest CT;  b. Dob MV 1/14:  EF 85%, no ischemia   Past Surgical History  Procedure Laterality Date  . Appendectomy    . Tonsillectomy    . Bone graft hip iliac crest      Allergies  Allergies  Allergen Reactions  . Penicillins Hives    Has taken it since.  . Pneumococcal Vaccine Swelling    Severe localized swelling in arm  . Pneumovax [Pneumococcal Polysaccharide Vaccine] Hives    HPI  She has a hx of PAD, HTN, COPD. She's had a question of infrarenal abdominal aorta dilatation on CT in the past. Subsequent ultrasounds have been negative for aneurysm. She last saw [name] 8/12 after undergoing a "Life Screening Survey". This demonstrated mild carotid artery stenosis bilaterally (no percentage given) and mildly reduced ABIs bilaterally (0.9). Recommendation was to repeat her carotid Dopplers in 02/2013. Admitted 04/2012 with a COPD exacerbation. She was followed by pulmonary/critical care. Chest CT was negative for pulmonary embolism. She did have multiple lung nodules noted and needs followup CT in 3-6 months. Echocardiogram 04/16/12: Mild focal basal  septal hypertrophy, EF 70-75% with dynamic obstruction with mid cavity obliteration, grade 1 diastolic dysfunction, MAC, atrial septal thickening with findings consistent with lipomatous hypertrophy.  I saw her in followup 05/09/12. I titrated her beta blocker given her hypertrophic cardiomyopathy to slow her HR some. She remains on chronic O2. Chronic dyspnea is essentially unchanged.  I last saw her 07/23/12 and she complained of chest pain. Of note, she had coronary calcifications on chest CT in 04/2012. Dobutamine Myoview 08/06/12: No ischemia, EF 85%. Today, she notes worsening cough and congestion. Sputum is yellow. No hemoptysis. No fever. She notes worsening dyspnea as well. Remains on O2. She has chest tightness that is better with SABA and cough. No wheezing.  Labs (10/13): K 4.5, creatinine 0.76, ALT 16, Hgb 14  R LE ultrasound 10/13: Negative for DVT   02/03/2014 - the patient complains of worsening dyspnea on exertion after walking just a few feet. She denies any chest pain, palpitations or syncope. She denies any paroxysmal nocturnal dyspnea. She continues using home O2 therapy. She also states that her toes discoloration of her toes is getting worse. She denies any claudications. She also complains of intermittent lower extremity edema around her ankles bilaterally.  06/17/2015 - patient is coming after one year, since the last year her oxygen requirements have increased, she is on 24/7 oxygen therapy through nasal cannula, she is minimally active and feels short of breath at baseline. She is using rescue inhalers to alleviate some of the symptoms but they don't seem to be working well  lately. She denies any chest pain, no orthopnea or paroxysmal nocturnal dyspnea, no lower extremity edema.She also denies palpitations or syncope.   Home Medications  Prior to Admission medications   Medication Sig Start Date End Date Taking? Authorizing Provider  albuterol (PROVENTIL HFA;VENTOLIN HFA) 108  (90 BASE) MCG/ACT inhaler Inhale 2 puffs into the lungs every 6 (six) hours as needed. For shortness of breath   Yes Historical Provider, MD  aspirin 81 MG tablet Take 81 mg by mouth daily.     Yes Historical Provider, MD  atorvastatin (LIPITOR) 10 MG tablet Take 10 mg by mouth daily.     Yes Historical Provider, MD  calcium-vitamin D (OSCAL WITH D) 500-200 MG-UNIT per tablet Take 1 tablet by mouth 2 (two) times daily.     Yes Historical Provider, MD  famotidine (PEPCID) 20 MG tablet Take 1 tablet 2 times daily for 4 weeks then as needed 07/23/12  Yes Sheri T Alben Spittle, PA-C  metoprolol tartrate (LOPRESSOR) 25 MG tablet TAKE 1 TABLET BY MOUTH TWICE DAILY 07/21/13  Yes Sheri Masson, MD  Multiple Vitamin (MULTIVITAMIN) tablet Take 1 tablet by mouth daily.     Yes Historical Provider, MD  SPIRIVA HANDIHALER 18 MCG inhalation capsule INHALE CONTENTS OF 1 CAPSULE ONCE EVERY DAY AS DIRECTED 08/07/13  Yes Sheri S Parrett, NP  SYMBICORT 160-4.5 MCG/ACT inhaler INHALE 2 PUFFS INTO THE LUNGS 2 (TWO) TIMES DAILY. 08/21/13  Yes Sheri Peer, MD  vitamin C (ASCORBIC ACID) 500 MG tablet Take 500 mg by mouth 2 (two) times daily.   Yes Historical Provider, MD   Family History  Family History  Problem Relation Age of Onset  . Coronary artery disease      family hx of female 1st. degree relative <50/family hx of 1st. degree relative <60  . Emphysema Father   . Heart disease Father   . Stomach cancer Father   . Emphysema      3 sisters. they were smokers  . Stomach cancer Sister   . Emphysema Sister   . Heart disease Mother   . Coronary artery disease Brother     Social History  Social History   Social History  . Marital Status: Married    Spouse Name: N/A  . Number of Children: 4  . Years of Education: N/A   Occupational History  . retired     [employer]   Social History Main Topics  . Smoking status: Former Smoker -- 1.00 packs/day for 49 years    Types: Cigarettes    Quit date:  07/11/2005  . Smokeless tobacco: Never Used     Comment: smoked for 48 years 1 ppd  . Alcohol Use: No  . Drug Use: No  . Sexual Activity: Not on file   Other Topics Concern  . Not on file   Social History Narrative    Review of Systems, as per HPI, otherwise negative General:  No chills, fever, night sweats or weight changes.  Cardiovascular:  No chest pain, dyspnea on exertion, edema, orthopnea, palpitations, paroxysmal nocturnal dyspnea. Dermatological: No rash, lesions/masses Respiratory: No cough, dyspnea Urologic: No hematuria, dysuria Abdominal:   No nausea, vomiting, diarrhea, bright red blood per rectum, melena, or hematemesis Neurologic:  No visual changes, wkns, changes in mental status. All other systems reviewed and are otherwise negative except as noted above.  Physical Exam  Blood pressure 124/60, pulse 91, height  (1.549 m), weight 126 lb (57.153 kg), SpO2 91 %.  General: Pleasant, NAD Psych: Normal affect. Neuro: Alert and oriented X 3. Moves all extremities spontaneously. HEENT: Normal  Neck: Supple without bruits or JVD. Lungs:  Resp regular and unlabored, CTA. Heart: RRR no s3, s4, or murmurs. Abdomen: Soft, non-tender, non-distended, BS + x 4.  Extremities: No clubbing, mild bilateral cyanosis, mild B/L edema around ankles. Radials 2+ and equal bilaterally. DP/PT + 1 on the left, not palpable on the right.  Labs:  No results for input(s): CKTOTAL, CKMB, TROPONINI in the last 72 hours. Lab Results  Component Value Date   WBC 11.2* 04/19/2012   HGB 14.0 04/19/2012   HCT 40.5 04/19/2012   MCV 92.9 04/19/2012   PLT 349 04/19/2012    No results found for: DDIMER Invalid input(s): POCBNP    Component Value Date/Time   NA 134* 04/20/2012 0605   K 4.5 04/20/2012 0605   CL 91* 04/20/2012 0605   CO2 32 04/20/2012 0605   GLUCOSE 120* 04/20/2012 0605   BUN 20 04/20/2012 0605   CREATININE 0.76 04/20/2012 0605   CALCIUM 9.9 04/20/2012 0605   PROT  7.1 04/17/2012 0655   ALBUMIN 3.5 04/17/2012 0655   AST 20 04/17/2012 0655   ALT 16 04/17/2012 0655   ALKPHOS 60 04/17/2012 0655   BILITOT 0.3 04/17/2012 0655   GFRNONAA 81* 04/20/2012 0605   GFRAA >90 04/20/2012 0605   Lab Results  Component Value Date   CHOL 213 12/16/2008   HDL 62 12/16/2008   LDLCALC 116 12/16/2008   TRIG 166 12/16/2008   Accessory Clinical Findings  Echocardiogram - 04/18/2014 Study Conclusions  - Left ventricle: The cavity size was normal. There was mild focal basal hypertrophy of the septum. The estimated ejection fraction was in the range of 70% to 75%. There was dynamic obstruction, with mid-cavity obliteration. Wall motion was normal; there were no regional wall motion abnormalities. Doppler parameters are consistent with abnormal left ventricular relaxation (grade 1 diastolic dysfunction). - Mitral valve: Calcified annulus. - Atrial septum: There was increased thickness of the septum, consistent with lipomatous hypertrophy.  ECG - sinus bradycardia, 55 beats per minute, early repolarization, otherwise normal EKG  Lexiscan nuclear stress test: 02/17/2014 Quantitative Gated Spect Images  QGS EDV: 58 ml  QGS ESV: 17 ml  Impression  Exercise Capacity: Lexiscan with no exercise.  BP Response: Hypertensive blood pressure response.  Clinical Symptoms: There is dyspnea.  ECG Impression: No significant ST segment change suggestive of ischemia.  Comparison with Prior Nuclear Study: Compared to 08/06/12, no change.  Overall Impression: Normal stress nuclear study.  LV Ejection Fraction: 71%. LV Wall Motion: NL LV Function; NL Wall Motion  B/L Lower extremity arterial Duplex 02/17/2014 No evidence of segmental lower extremity arterial disease at rest, bilaterally. Normal ABI's, bilaterally. Abnormal great toe-brachial indices, bilaterally    Assessment & Plan  1. Hypertrophic cardiomyopathy  - worsening dyspnea on exertion - this is most probably  multifactorial secondary to COPD on home O2 therapy and hypertrophic cardiomyopathy. She needs to be continued on a blockers.  Nuclear stress test negative for prior scar or ischemia. She was given free samples of nebivolol 5 mg daily for the next 2 months if that improves her symptoms we will apply for precertification.  2. Hypertension - controlled  3. COPD - on home O2 therapy.   4. Peripheral arterial disease - bluish discoloration of her lower extremities with decreased pulses bilaterally. Normal ABIs B/L, abnormal  great toe-brachial indices B/L.   5. Coronary calcification on  CT - Continue ASA, beta blocker and statin.  6. B/L LE edema - resolved.  Followup in 2 months.  Sheri Masson, MD, [facility] 06/17/2015, 9:48 AM

## 2015-07-30 ENCOUNTER — Other Ambulatory Visit: Payer: Self-pay | Admitting: Emergency Medicine

## 2015-08-17 ENCOUNTER — Telehealth: Payer: Self-pay | Admitting: Cardiology

## 2015-08-17 ENCOUNTER — Ambulatory Visit: Payer: PPO | Admitting: Cardiology

## 2015-08-17 NOTE — Telephone Encounter (Signed)
Pt calling re being sick this am and her husband rs her appt to 10-05-15, will run out of samples before then -pt requesting more-pls advise [phone]

## 2015-08-17 NOTE — Telephone Encounter (Signed)
Pt calling in to inform Dr Delton See and myself that she currently has a bad cold and cancelled her appt today with Dr Delton See at 1045.  Pt rescheduled her appt for 10/05/15 with Dr Delton See.  Pt requesting samples of Bystolic 5 mg po daily to be left at the front for her children to pick up, for she is almost out of this medication, and was instructed not to fill until her next OV with Dr Delton See, to see if she tolerates this med or not.   Being the pt had to r/s today, she will need more samples to be picked up.  Informed the pt that I have samples of Bystolic 5 mg po daily on hand and I have left them up front for her kids to pick-up.  Pt verbalized understanding and agrees with this plan.  Pt gracious for all the assistance provided.

## 2015-08-28 ENCOUNTER — Telehealth: Payer: Self-pay | Admitting: Emergency Medicine

## 2015-08-28 MED ORDER — BUDESONIDE-FORMOTEROL FUMARATE 160-4.5 MCG/ACT IN AERO: INHALATION_SPRAY | RESPIRATORY_TRACT | Status: DC

## 2015-08-28 MED ORDER — TIOTROPIUM BROMIDE MONOHYDRATE 2.5 MCG/ACT IN AERS: INHALATION_SPRAY | RESPIRATORY_TRACT | Status: DC

## 2015-08-28 NOTE — Telephone Encounter (Signed)
Pt requesting 90 day supply of symbicort and spiriva to [facility] in [place].  This has been sent.  Nothing further needed.

## 2015-09-08 ENCOUNTER — Emergency Department (HOSPITAL_COMMUNITY): Payer: PPO

## 2015-09-08 ENCOUNTER — Inpatient Hospital Stay (HOSPITAL_COMMUNITY)
Admission: EM | Admit: 2015-09-08 | Discharge: 2015-09-15 | DRG: 190 | Disposition: A | Discharge status: Home Health | Payer: PPO | Attending: Internal Medicine | Admitting: Internal Medicine

## 2015-09-08 DIAGNOSIS — J441 Chronic obstructive pulmonary disease with (acute) exacerbation: Secondary | ICD-10-CM | POA: Diagnosis not present

## 2015-09-08 DIAGNOSIS — J9622 Acute and chronic respiratory failure with hypercapnia: Secondary | ICD-10-CM | POA: Diagnosis present

## 2015-09-08 DIAGNOSIS — E785 Hyperlipidemia, unspecified: Secondary | ICD-10-CM | POA: Diagnosis present

## 2015-09-08 DIAGNOSIS — F419 Anxiety disorder, unspecified: Secondary | ICD-10-CM | POA: Diagnosis present

## 2015-09-08 DIAGNOSIS — E876 Hypokalemia: Secondary | ICD-10-CM | POA: Diagnosis not present

## 2015-09-08 DIAGNOSIS — Z87891 Personal history of nicotine dependence: Secondary | ICD-10-CM | POA: Diagnosis not present

## 2015-09-08 DIAGNOSIS — R7989 Other specified abnormal findings of blood chemistry: Secondary | ICD-10-CM

## 2015-09-08 DIAGNOSIS — D72829 Elevated white blood cell count, unspecified: Secondary | ICD-10-CM | POA: Diagnosis present

## 2015-09-08 DIAGNOSIS — R778 Other specified abnormalities of plasma proteins: Secondary | ICD-10-CM | POA: Diagnosis present

## 2015-09-08 DIAGNOSIS — R0603 Acute respiratory distress: Secondary | ICD-10-CM

## 2015-09-08 DIAGNOSIS — Z79899 Other long term (current) drug therapy: Secondary | ICD-10-CM

## 2015-09-08 DIAGNOSIS — I739 Peripheral vascular disease, unspecified: Secondary | ICD-10-CM | POA: Diagnosis present

## 2015-09-08 DIAGNOSIS — R0602 Shortness of breath: Secondary | ICD-10-CM | POA: Diagnosis not present

## 2015-09-08 DIAGNOSIS — Z9981 Dependence on supplemental oxygen: Secondary | ICD-10-CM | POA: Diagnosis not present

## 2015-09-08 DIAGNOSIS — R911 Solitary pulmonary nodule: Secondary | ICD-10-CM | POA: Diagnosis not present

## 2015-09-08 DIAGNOSIS — E440 Moderate protein-calorie malnutrition: Secondary | ICD-10-CM | POA: Diagnosis not present

## 2015-09-08 DIAGNOSIS — F411 Generalized anxiety disorder: Secondary | ICD-10-CM | POA: Insufficient documentation

## 2015-09-08 DIAGNOSIS — J9621 Acute and chronic respiratory failure with hypoxia: Secondary | ICD-10-CM | POA: Diagnosis present

## 2015-09-08 DIAGNOSIS — I2510 Atherosclerotic heart disease of native coronary artery without angina pectoris: Secondary | ICD-10-CM | POA: Diagnosis not present

## 2015-09-08 DIAGNOSIS — I422 Other hypertrophic cardiomyopathy: Secondary | ICD-10-CM | POA: Insufficient documentation

## 2015-09-08 DIAGNOSIS — Z6823 Body mass index (BMI) 23.0-23.9, adult: Secondary | ICD-10-CM

## 2015-09-08 DIAGNOSIS — I10 Essential (primary) hypertension: Secondary | ICD-10-CM | POA: Diagnosis not present

## 2015-09-08 DIAGNOSIS — R0600 Dyspnea, unspecified: Secondary | ICD-10-CM | POA: Diagnosis not present

## 2015-09-08 DIAGNOSIS — Z7982 Long term (current) use of aspirin: Secondary | ICD-10-CM | POA: Diagnosis not present

## 2015-09-08 DIAGNOSIS — R069 Unspecified abnormalities of breathing: Secondary | ICD-10-CM | POA: Diagnosis not present

## 2015-09-08 LAB — COMPREHENSIVE METABOLIC PANEL
ALBUMIN: 4.1 g/dL (ref 3.5–5.0)
ALK PHOS: 46 U/L (ref 38–126)
ALT: 20 U/L (ref 14–54)
AST: 22 U/L (ref 15–41)
Anion gap: 15 (ref 5–15)
BILIRUBIN TOTAL: 0.2 mg/dL — AB (ref 0.3–1.2)
BUN: 14 mg/dL (ref 6–20)
CALCIUM: 9.6 mg/dL (ref 8.9–10.3)
CO2: 27 mmol/L (ref 22–32)
Chloride: 97 mmol/L — ABNORMAL LOW (ref 101–111)
Creatinine, Ser: 0.85 mg/dL (ref 0.44–1.00)
GFR calc Af Amer: >60 mL/min (ref >60)
GFR calc non Af Amer: >60 mL/min (ref >60)
GLUCOSE: 179 mg/dL — AB (ref 65–99)
Potassium: 4.3 mmol/L (ref 3.5–5.1)
SODIUM: 139 mmol/L (ref 135–145)
TOTAL PROTEIN: 6.9 g/dL (ref 6.5–8.1)

## 2015-09-08 LAB — CBC WITH DIFFERENTIAL/PLATELET
BASOS ABS: 0 10*3/uL (ref 0.0–0.1)
BASOS PCT: 0 %
Eosinophils Absolute: 0.2 10*3/uL (ref 0.0–0.7)
Eosinophils Relative: 1 %
HEMATOCRIT: 37.8 % (ref 36.0–46.0)
HEMOGLOBIN: 12.2 g/dL (ref 12.0–15.0)
Lymphocytes Relative: 21 %
Lymphs Abs: 3.6 10*3/uL (ref 0.7–4.0)
MCH: 30.4 pg (ref 26.0–34.0)
MCHC: 32.3 g/dL (ref 30.0–36.0)
MCV: 94.3 fL (ref 78.0–100.0)
MONOS PCT: 5 %
Monocytes Absolute: 0.8 10*3/uL (ref 0.1–1.0)
NEUTROS ABS: 12.6 10*3/uL — AB (ref 1.7–7.7)
NEUTROS PCT: 73 %
Platelets: 353 10*3/uL (ref 150–400)
RBC: 4.01 MIL/uL (ref 3.87–5.11)
RDW: 12.3 % (ref 11.5–15.5)
WBC: 17.2 10*3/uL — AB (ref 4.0–10.5)

## 2015-09-08 LAB — I-STAT ARTERIAL BLOOD GAS, ED
ACID-BASE EXCESS: 5 mmol/L — AB (ref 0.0–2.0)
BICARBONATE: 35.2 meq/L — AB (ref 20.0–24.0)
O2 SAT: 99 %
PCO2 ART: 78.5 mmHg — AB (ref 35.0–45.0)
PO2 ART: 143 mmHg — AB (ref 80.0–100.0)
Patient temperature: 98.6
TCO2: 38 mmol/L (ref 0–100)
pH, Arterial: 7.259 — ABNORMAL LOW (ref 7.350–7.450)

## 2015-09-08 LAB — TROPONIN I: Troponin I: 0.04 ng/mL — ABNORMAL HIGH (ref <0.031)

## 2015-09-08 MED ORDER — MAGNESIUM SULFATE 2 GM/50ML IV SOLN
2.0000 g | Freq: Once | INTRAVENOUS | Status: AC
  Administered 2015-09-08: 2 g via INTRAVENOUS
  Filled 2015-09-08: qty 50

## 2015-09-08 MED ORDER — ALBUTEROL (5 MG/ML) CONTINUOUS INHALATION SOLN
10.0000 mg/h | INHALATION_SOLUTION | RESPIRATORY_TRACT | Status: AC
  Administered 2015-09-08: 10 mg/h via RESPIRATORY_TRACT
  Filled 2015-09-08: qty 20

## 2015-09-08 MED ORDER — LEVOFLOXACIN IN D5W 500 MG/100ML IV SOLN
500.0000 mg | Freq: Once | INTRAVENOUS | Status: AC
  Administered 2015-09-09: 500 mg via INTRAVENOUS
  Filled 2015-09-08: qty 100

## 2015-09-08 MED ORDER — IPRATROPIUM BROMIDE 0.02 % IN SOLN
0.5000 mg | RESPIRATORY_TRACT | Status: AC
  Administered 2015-09-08: 0.5 mg via RESPIRATORY_TRACT
  Filled 2015-09-08: qty 2.5

## 2015-09-08 NOTE — ED Notes (Signed)
Patient here with complaint of acute shortness of breath starting about 1 hour ago. EMS attempted to place patient on CPAP without success. They then placed patient on aerosol mask and patient was able to maintain O2 saturation, but remained extremely anxious.  Albuterol, 0.5mg  Atrovent,  Solu-medrol given by EMS. Hypertensive for EMS PTA.

## 2015-09-08 NOTE — ED Provider Notes (Signed)
CSN: [id number]     Arrival date & time 09/08/15  2129 History   First MD Initiated Contact with Patient 09/08/15 2131     Chief Complaint  Patient presents with  . Shortness of Breath     (Consider location/radiation/quality/duration/timing/severity/associated sxs/prior Treatment) HPI Comments: The patient is a 81 year old female, she has a known history of COPD, she is on home oxygen at 3 L by nasal cannula chronically. She present by embolus after stating that 1 hour ago she became acutely short of breath. This was severe, persistent, gradually worsening, she was attempted to use CPAP by EMS but she did not tolerate it. She had a nebulizer given prehospital with 10 mg of albuterol and had no improvement. She was given Solu-Medrol prehospital. The patient denies to me that she has been having any recent coughing or fevers and until this happened 1 hour ago she was doing well. She has no swelling of the legs, no recent PET exposures, no smoke exposure, she does not smoke cigarettes anymore. She denies chest pain, back pain or abdominal pain and has not been throwing up. She is only able to answer with yes or no, her respiratory distress is such that she is unable to speak in sentences.  Patient is a 81 y.o. female presenting with shortness of breath. The history is provided by the patient.  Shortness of Breath   Past Medical History  Diagnosis Date  . PVD (peripheral vascular disease) (HCC)   . Actinomycosis, cervicofacial   . Emphysema   . COPD (chronic obstructive pulmonary disease) (HCC)   . HTN (hypertension)   . Hx of echocardiogram     Echocardiogram 04/16/12: Mild focal basal septal hypertrophy, EF 70-75% with dynamic obstruction with mid cavity obliteration, grade 1 diastolic dysfunction, MAC, atrial septal thickening with findings consistent with lipomatous hypertrophy  . Hyperlipidemia   . CAD (coronary artery disease)     a. coronary calcification on prior Chest CT;  b. Dob MV  1/14:  EF 85%, no ischemia   Past Surgical History  Procedure Laterality Date  . Appendectomy    . Tonsillectomy    . Bone graft hip iliac crest     Family History  Problem Relation Age of Onset  . Coronary artery disease      family hx of female 1st. degree relative <50/family hx of 1st. degree relative <60  . Emphysema Father   . Heart disease Father   . Stomach cancer Father   . Emphysema      3 sisters. they were smokers  . Stomach cancer Sister   . Emphysema Sister   . Heart disease Mother   . Coronary artery disease Brother    Social History  Substance Use Topics  . Smoking status: Former Smoker -- 1.00 packs/day for 49 years    Types: Cigarettes    Quit date: 07/11/2005  . Smokeless tobacco: Never Used     Comment: smoked for 48 years 1 ppd  . Alcohol Use: No   OB History    No data available     Review of Systems  Respiratory: Positive for shortness of breath.   All other systems reviewed and are negative.     Allergies  Penicillins; Pneumococcal vaccine; and Pneumovax  Home Medications   Prior to Admission medications   Medication Sig Start Date End Date Taking? Authorizing Provider  albuterol (PROVENTIL HFA;VENTOLIN HFA) 108 (90 BASE) MCG/ACT inhaler Inhale 2 puffs into the lungs every 6 (six) hours  as needed. For shortness of breath   Yes Historical Provider, MD  albuterol (PROVENTIL) (2.5 MG/3ML) 0.083% nebulizer solution Take 3 mLs (2.5 mg total) by nebulization every 6 (six) hours as needed for wheezing or shortness of breath. 05/14/15  Yes Leslye Peer, MD  aspirin 81 MG tablet Take 81 mg by mouth daily.     Yes Historical Provider, MD  atorvastatin (LIPITOR) 10 MG tablet Take 10 mg by mouth daily.     Yes Historical Provider, MD  budesonide-formoterol (SYMBICORT) 160-4.5 MCG/ACT inhaler INHALE 2 PUFFS INTO THE LUNGS 2 (TWO) TIMES DAILY. 08/28/15  Yes Leslye Peer, MD  Multiple Vitamin (MULTIVITAMIN) tablet Take 1 tablet by mouth daily.     Yes  Historical Provider, MD  nebivolol (BYSTOLIC) 5 MG tablet Take 1 tablet (5 mg total) by mouth daily. 06/17/15  Yes Lars Masson, MD  niacin (NIASPAN) 1000 MG CR tablet Take 1,000 mg by mouth daily.   Yes Historical Provider, MD  Tiotropium Bromide Monohydrate (SPIRIVA RESPIMAT) 2.5 MCG/ACT AERS INHALE 2 PUFFS INTO THE LUNGS DAILY. 08/28/15  Yes Leslye Peer, MD  famotidine (PEPCID) 20 MG tablet Take 1 tablet 2 times daily for 4 weeks then as needed Patient not taking: Reported on 09/08/2015 07/23/12   Beatrice Lecher, PA-C  UNABLE TO FIND Inhale 3 L into the lungs continuous.    Historical Provider, MD   BP 136/77 mmHg  Pulse 108  Temp(Src) 97.7 F (36.5 C) (Oral)  Resp 18  SpO2 100% Physical Exam  Constitutional: She appears well-developed and well-nourished. She appears distressed.  HENT:  Head: Normocephalic and atraumatic.  Mouth/Throat: Oropharynx is clear and moist. No oropharyngeal exudate.  Eyes: Conjunctivae and EOM are normal. Pupils are equal, round, and reactive to light. Right eye exhibits no discharge. Left eye exhibits no discharge. No scleral icterus.  Neck: Normal range of motion. Neck supple. No JVD present. No thyromegaly present.  Cardiovascular: Regular rhythm, normal heart sounds and intact distal pulses.  Exam reveals no gallop and no friction rub.   No murmur heard. Tachycardia, strong pulses  Pulmonary/Chest: She is in respiratory distress. She has wheezes. She has no rales.  Speaks in 1 word sentences, using accessory muscles, tachypnea, prolonged expiratory phase, diffuse wheezing and decreased air movement diffusely  Abdominal: Soft. Bowel sounds are normal. She exhibits no distension and no mass. There is no tenderness.  Musculoskeletal: Normal range of motion. She exhibits no edema or tenderness.  Lymphadenopathy:    She has no cervical adenopathy.  Neurological: She is alert. Coordination normal.  Skin: Skin is warm and dry. No rash noted. No erythema.   Psychiatric: She has a normal mood and affect. Her behavior is normal.  Nursing note and vitals reviewed.   ED Course  Procedures (including critical care time) Labs Review Labs Reviewed  CBC WITH DIFFERENTIAL/PLATELET - Abnormal; Notable for the following:    WBC 17.2 (*)    Neutro Abs 12.6 (*)    All other components within normal limits  COMPREHENSIVE METABOLIC PANEL - Abnormal; Notable for the following:    Chloride 97 (*)    Glucose, Bld 179 (*)    Total Bilirubin 0.2 (*)    All other components within normal limits  TROPONIN I - Abnormal; Notable for the following:    Troponin I 0.04 (*)    All other components within normal limits  I-STAT ARTERIAL BLOOD GAS, ED - Abnormal; Notable for the following:    pH,  Arterial 7.259 (*)    pCO2 arterial 78.5 (*)    pO2, Arterial 143.0 (*)    Bicarbonate 35.2 (*)    Acid-Base Excess 5.0 (*)    All other components within normal limits  BLOOD GAS, ARTERIAL  I-STAT ARTERIAL BLOOD GAS, ED    Imaging Review Dg Chest Port 1 View  09/08/2015  CLINICAL DATA:  81 year old female with respiratory distress. EXAM: PORTABLE CHEST 1 VIEW COMPARISON:  CT dated 11/26/2014 FINDINGS: Single-view of the chest demonstrates emphysematous changes of the lungs. There is no focal consolidation, pleural effusion, or pneumothorax. The cardiac silhouette is within normal limits. No acute osseous pathology. IMPRESSION: No active disease. Electronically Signed   By: Elgie Collard M.D.   On: 09/08/2015 22:01   I have personally reviewed and evaluated these images and lab results as part of my medical decision-making.   EKG Interpretation   Date/Time:  Tuesday September 08 2015 21:40:10 EST Ventricular Rate:  112 PR Interval:  164 QRS Duration: 111 QT Interval:  328 QTC Calculation: 448 R Axis:   69 Text Interpretation:  Sinus tachycardia Biatrial enlargement Minimal ST  depression, inferior leads Artifact in lead(s) I V4 V5 and baseline wander  in  lead(s) I II III aVL aVF V1 V2 V6 Since last tracing rate faster  Confirmed by Urvi Imes  MD, Burna Atlas ([id number]) on 09/08/2015 9:45:09 PM      MDM   Final diagnoses:  Respiratory distress  COPD exacerbation (HCC)    The patient is in distress, this appears to be a primary pulmonary process, given the acute onset will also check to rule out congestive heart failure though I see no other signs of fluid overload. The patient has oxygenation which seems adequate however I suspect that she is probably going to be acidotic from a respiratory acidosis. We'll obtain an ABG, labs, chest x-ray, continuous nebulizer therapy, the patient is critically ill in acute respiratory distress. I anticipate the need to be admitted to a high level of care she does not improve. I will give her magnesium sulfate  The patient continues to be in acute respiratory distress, she required BiPAP, she was able to avoid intubation using BiPAP and continued to be tachypneic however her oxygenation improved, repeat ABG will be ordered, initial ABG showed respiratory acidosis. Discussed care with the hospitalist will admit the patient to stepdown due to severe respiratory distress.  CRITICAL CARE Performed by: Vida Roller Total critical care time: 35 minutes Critical care time was exclusive of separately billable procedures and treating other patients. Critical care was necessary to treat or prevent imminent or life-threatening deterioration. Critical care was time spent personally by me on the following activities: development of treatment plan with patient and/or surrogate as well as nursing, discussions with consultants, evaluation of patient's response to treatment, examination of patient, obtaining history from patient or surrogate, ordering and performing treatments and interventions, ordering and review of laboratory studies, ordering and review of radiographic studies, pulse oximetry and re-evaluation of patient's  condition.   Medical screening examination/treatment/procedure(s) were conducted as a shared visit with non-physician practitioner(s) and myself.  I personally evaluated the patient during the encounter.  Clinical Impression:   Final diagnoses:  Respiratory distress  COPD exacerbation (HCC)           Eber Hong, MD 09/09/15 6045

## 2015-09-09 ENCOUNTER — Encounter (HOSPITAL_COMMUNITY): Payer: Self-pay | Admitting: Family Medicine

## 2015-09-09 DIAGNOSIS — D72829 Elevated white blood cell count, unspecified: Secondary | ICD-10-CM | POA: Insufficient documentation

## 2015-09-09 DIAGNOSIS — J9621 Acute and chronic respiratory failure with hypoxia: Secondary | ICD-10-CM | POA: Diagnosis present

## 2015-09-09 DIAGNOSIS — J441 Chronic obstructive pulmonary disease with (acute) exacerbation: Secondary | ICD-10-CM | POA: Diagnosis not present

## 2015-09-09 DIAGNOSIS — F419 Anxiety disorder, unspecified: Secondary | ICD-10-CM | POA: Diagnosis not present

## 2015-09-09 DIAGNOSIS — R0602 Shortness of breath: Secondary | ICD-10-CM | POA: Diagnosis not present

## 2015-09-09 DIAGNOSIS — E440 Moderate protein-calorie malnutrition: Secondary | ICD-10-CM | POA: Diagnosis not present

## 2015-09-09 DIAGNOSIS — Z6823 Body mass index (BMI) 23.0-23.9, adult: Secondary | ICD-10-CM | POA: Diagnosis not present

## 2015-09-09 DIAGNOSIS — E785 Hyperlipidemia, unspecified: Secondary | ICD-10-CM | POA: Diagnosis not present

## 2015-09-09 DIAGNOSIS — R0600 Dyspnea, unspecified: Secondary | ICD-10-CM | POA: Diagnosis not present

## 2015-09-09 DIAGNOSIS — Z87891 Personal history of nicotine dependence: Secondary | ICD-10-CM | POA: Diagnosis not present

## 2015-09-09 DIAGNOSIS — J9622 Acute and chronic respiratory failure with hypercapnia: Secondary | ICD-10-CM | POA: Diagnosis not present

## 2015-09-09 DIAGNOSIS — I739 Peripheral vascular disease, unspecified: Secondary | ICD-10-CM | POA: Diagnosis not present

## 2015-09-09 DIAGNOSIS — R7989 Other specified abnormal findings of blood chemistry: Secondary | ICD-10-CM | POA: Diagnosis not present

## 2015-09-09 DIAGNOSIS — I2510 Atherosclerotic heart disease of native coronary artery without angina pectoris: Secondary | ICD-10-CM | POA: Diagnosis not present

## 2015-09-09 DIAGNOSIS — R911 Solitary pulmonary nodule: Secondary | ICD-10-CM | POA: Diagnosis not present

## 2015-09-09 DIAGNOSIS — Z79899 Other long term (current) drug therapy: Secondary | ICD-10-CM | POA: Diagnosis not present

## 2015-09-09 DIAGNOSIS — Z9981 Dependence on supplemental oxygen: Secondary | ICD-10-CM | POA: Diagnosis not present

## 2015-09-09 DIAGNOSIS — R778 Other specified abnormalities of plasma proteins: Secondary | ICD-10-CM | POA: Insufficient documentation

## 2015-09-09 DIAGNOSIS — E876 Hypokalemia: Secondary | ICD-10-CM | POA: Diagnosis not present

## 2015-09-09 DIAGNOSIS — Z7982 Long term (current) use of aspirin: Secondary | ICD-10-CM | POA: Diagnosis not present

## 2015-09-09 DIAGNOSIS — I10 Essential (primary) hypertension: Secondary | ICD-10-CM | POA: Diagnosis not present

## 2015-09-09 DIAGNOSIS — R069 Unspecified abnormalities of breathing: Secondary | ICD-10-CM | POA: Diagnosis not present

## 2015-09-09 DIAGNOSIS — A419 Sepsis, unspecified organism: Secondary | ICD-10-CM | POA: Insufficient documentation

## 2015-09-09 DIAGNOSIS — R0603 Acute respiratory distress: Secondary | ICD-10-CM | POA: Insufficient documentation

## 2015-09-09 LAB — I-STAT ARTERIAL BLOOD GAS, ED
ACID-BASE EXCESS: 2 mmol/L (ref 0.0–2.0)
BICARBONATE: 29.9 meq/L — AB (ref 20.0–24.0)
O2 SAT: 100 %
PH ART: 7.329 — AB (ref 7.350–7.450)
PO2 ART: 217 mmHg — AB (ref 80.0–100.0)
Patient temperature: 98.6
TCO2: 32 mmol/L (ref 0–100)
pCO2 arterial: 56.7 mmHg — ABNORMAL HIGH (ref 35.0–45.0)

## 2015-09-09 LAB — TROPONIN I
TROPONIN I: 0.33 ng/mL — AB (ref <0.031)
Troponin I: 0.4 ng/mL — ABNORMAL HIGH (ref <0.031)
Troponin I: 0.46 ng/mL — ABNORMAL HIGH (ref <0.031)
Troponin I: 0.54 ng/mL (ref <0.031)

## 2015-09-09 LAB — PROTIME-INR
INR: 0.91 (ref 0.00–1.49)
PROTHROMBIN TIME: 12.5 s (ref 11.6–15.2)

## 2015-09-09 LAB — MRSA PCR SCREENING: MRSA by PCR: NEGATIVE

## 2015-09-09 LAB — CBG MONITORING, ED: Glucose-Capillary: 139 mg/dL — ABNORMAL HIGH (ref 65–99)

## 2015-09-09 LAB — BRAIN NATRIURETIC PEPTIDE: B NATRIURETIC PEPTIDE 5: 58 pg/mL (ref 0.0–100.0)

## 2015-09-09 MED ORDER — LEVALBUTEROL HCL 0.63 MG/3ML IN NEBU
0.6300 mg | INHALATION_SOLUTION | Freq: Four times a day (QID) | RESPIRATORY_TRACT | Status: DC
  Administered 2015-09-09 – 2015-09-10 (×2): 0.63 mg via RESPIRATORY_TRACT
  Filled 2015-09-09 (×4): qty 3

## 2015-09-09 MED ORDER — BISACODYL 5 MG PO TBEC: 5.0000 mg | DELAYED_RELEASE_TABLET | Freq: Every day | ORAL | Status: DC | PRN

## 2015-09-09 MED ORDER — TIOTROPIUM BROMIDE MONOHYDRATE 18 MCG IN CAPS
18.0000 ug | ORAL_CAPSULE | Freq: Every day | RESPIRATORY_TRACT | Status: DC
  Administered 2015-09-09: 18 ug via RESPIRATORY_TRACT
  Filled 2015-09-09: qty 5

## 2015-09-09 MED ORDER — LEVOFLOXACIN IN D5W 750 MG/150ML IV SOLN: 750.0000 mg | INTRAVENOUS | Status: DC

## 2015-09-09 MED ORDER — LEVALBUTEROL HCL 0.63 MG/3ML IN NEBU: 0.6300 mg | INHALATION_SOLUTION | RESPIRATORY_TRACT | Status: DC | PRN

## 2015-09-09 MED ORDER — ACETAMINOPHEN 650 MG RE SUPP: 650.0000 mg | Freq: Four times a day (QID) | RECTAL | Status: DC | PRN

## 2015-09-09 MED ORDER — MOMETASONE FURO-FORMOTEROL FUM 200-5 MCG/ACT IN AERO
2.0000 | INHALATION_SPRAY | Freq: Two times a day (BID) | RESPIRATORY_TRACT | Status: DC
  Administered 2015-09-09: 2 via RESPIRATORY_TRACT
  Filled 2015-09-09: qty 8.8

## 2015-09-09 MED ORDER — LEVALBUTEROL HCL 0.63 MG/3ML IN NEBU
0.6300 mg | INHALATION_SOLUTION | Freq: Four times a day (QID) | RESPIRATORY_TRACT | Status: DC
  Administered 2015-09-09 (×3): 0.63 mg via RESPIRATORY_TRACT
  Filled 2015-09-09 (×4): qty 3

## 2015-09-09 MED ORDER — ONDANSETRON HCL 4 MG PO TABS: 4.0000 mg | ORAL_TABLET | Freq: Four times a day (QID) | ORAL | Status: DC | PRN

## 2015-09-09 MED ORDER — ADULT MULTIVITAMIN W/MINERALS CH
1.0000 | ORAL_TABLET | Freq: Every day | ORAL | Status: DC
  Administered 2015-09-09 – 2015-09-15 (×7): 1 via ORAL
  Filled 2015-09-09 (×7): qty 1

## 2015-09-09 MED ORDER — NIACIN ER 500 MG PO CPCR
1000.0000 mg | ORAL_CAPSULE | Freq: Every day | ORAL | Status: DC
  Administered 2015-09-09 – 2015-09-15 (×7): 1000 mg via ORAL
  Filled 2015-09-09: qty 4
  Filled 2015-09-09 (×11): qty 2

## 2015-09-09 MED ORDER — POLYETHYLENE GLYCOL 3350 17 G PO PACK: 17.0000 g | PACK | Freq: Every day | ORAL | Status: DC | PRN

## 2015-09-09 MED ORDER — ONDANSETRON HCL 4 MG/2ML IJ SOLN: 4.0000 mg | Freq: Four times a day (QID) | INTRAMUSCULAR | Status: DC | PRN

## 2015-09-09 MED ORDER — SODIUM CHLORIDE 0.9% FLUSH
3.0000 mL | Freq: Two times a day (BID) | INTRAVENOUS | Status: DC
  Administered 2015-09-09 – 2015-09-15 (×11): 3 mL via INTRAVENOUS

## 2015-09-09 MED ORDER — IPRATROPIUM-ALBUTEROL 0.5-2.5 (3) MG/3ML IN SOLN: 3.0000 mL | RESPIRATORY_TRACT | Status: DC | PRN

## 2015-09-09 MED ORDER — LEVALBUTEROL HCL 0.63 MG/3ML IN NEBU
0.6300 mg | INHALATION_SOLUTION | RESPIRATORY_TRACT | Status: DC | PRN
  Filled 2015-09-09: qty 3

## 2015-09-09 MED ORDER — METHYLPREDNISOLONE SODIUM SUCC 125 MG IJ SOLR
60.0000 mg | Freq: Four times a day (QID) | INTRAMUSCULAR | Status: DC
  Administered 2015-09-09 (×3): 60 mg via INTRAVENOUS
  Filled 2015-09-09 (×3): qty 2

## 2015-09-09 MED ORDER — IPRATROPIUM BROMIDE 0.02 % IN SOLN
0.5000 mg | Freq: Four times a day (QID) | RESPIRATORY_TRACT | Status: DC
  Administered 2015-09-09 – 2015-09-10 (×2): 0.5 mg via RESPIRATORY_TRACT
  Filled 2015-09-09 (×4): qty 2.5

## 2015-09-09 MED ORDER — ASPIRIN EC 81 MG PO TBEC
81.0000 mg | DELAYED_RELEASE_TABLET | Freq: Every day | ORAL | Status: DC
  Administered 2015-09-09 – 2015-09-15 (×7): 81 mg via ORAL
  Filled 2015-09-09 (×7): qty 1

## 2015-09-09 MED ORDER — ENOXAPARIN SODIUM 40 MG/0.4ML ~~LOC~~ SOLN
40.0000 mg | SUBCUTANEOUS | Status: DC
  Administered 2015-09-10 – 2015-09-14 (×5): 40 mg via SUBCUTANEOUS
  Filled 2015-09-09 (×6): qty 0.4

## 2015-09-09 MED ORDER — ATORVASTATIN CALCIUM 10 MG PO TABS
10.0000 mg | ORAL_TABLET | Freq: Every day | ORAL | Status: DC
  Administered 2015-09-09 – 2015-09-15 (×7): 10 mg via ORAL
  Filled 2015-09-09 (×7): qty 1

## 2015-09-09 MED ORDER — LORAZEPAM 2 MG/ML IJ SOLN
0.5000 mg | Freq: Once | INTRAMUSCULAR | Status: AC
  Administered 2015-09-09: 0.5 mg via INTRAVENOUS
  Filled 2015-09-09: qty 1

## 2015-09-09 MED ORDER — HYDROCODONE-ACETAMINOPHEN 5-325 MG PO TABS
1.0000 | ORAL_TABLET | ORAL | Status: DC | PRN
  Administered 2015-09-13: 1 via ORAL
  Administered 2015-09-14: 2 via ORAL
  Administered 2015-09-14: 1 via ORAL
  Filled 2015-09-09 (×2): qty 1
  Filled 2015-09-09 (×2): qty 2

## 2015-09-09 MED ORDER — SODIUM CHLORIDE 0.9 % IV SOLN
INTRAVENOUS | Status: DC
  Administered 2015-09-09: 04:00:00 via INTRAVENOUS

## 2015-09-09 MED ORDER — BUDESONIDE 0.25 MG/2ML IN SUSP
0.2500 mg | Freq: Two times a day (BID) | RESPIRATORY_TRACT | Status: DC
  Administered 2015-09-09 – 2015-09-10 (×2): 0.25 mg via RESPIRATORY_TRACT
  Filled 2015-09-09 (×3): qty 2

## 2015-09-09 MED ORDER — METHYLPREDNISOLONE SODIUM SUCC 125 MG IJ SOLR
60.0000 mg | Freq: Two times a day (BID) | INTRAMUSCULAR | Status: DC
  Administered 2015-09-10: 60 mg via INTRAVENOUS
  Filled 2015-09-09: qty 2

## 2015-09-09 MED ORDER — FUROSEMIDE 10 MG/ML IJ SOLN
60.0000 mg | Freq: Once | INTRAMUSCULAR | Status: AC
  Administered 2015-09-09: 60 mg via INTRAVENOUS
  Filled 2015-09-09: qty 6

## 2015-09-09 MED ORDER — NEBIVOLOL HCL 5 MG PO TABS
5.0000 mg | ORAL_TABLET | Freq: Every day | ORAL | Status: DC
  Administered 2015-09-09 – 2015-09-11 (×4): 5 mg via ORAL
  Filled 2015-09-09 (×6): qty 1

## 2015-09-09 MED ORDER — IPRATROPIUM BROMIDE 0.02 % IN SOLN
0.5000 mg | RESPIRATORY_TRACT | Status: DC | PRN
  Filled 2015-09-09 (×2): qty 2.5

## 2015-09-09 MED ORDER — IPRATROPIUM BROMIDE 0.02 % IN SOLN
0.5000 mg | Freq: Four times a day (QID) | RESPIRATORY_TRACT | Status: DC
  Administered 2015-09-09 (×2): 0.5 mg via RESPIRATORY_TRACT
  Filled 2015-09-09: qty 2.5

## 2015-09-09 MED ORDER — ACETAMINOPHEN 325 MG PO TABS
650.0000 mg | ORAL_TABLET | Freq: Four times a day (QID) | ORAL | Status: DC | PRN
  Administered 2015-09-11: 650 mg via ORAL
  Filled 2015-09-09: qty 2

## 2015-09-09 MED ORDER — IPRATROPIUM-ALBUTEROL 0.5-2.5 (3) MG/3ML IN SOLN: 3.0000 mL | Freq: Four times a day (QID) | RESPIRATORY_TRACT | Status: DC

## 2015-09-09 MED ORDER — ASPIRIN 81 MG PO CHEW: 324.0000 mg | CHEWABLE_TABLET | Freq: Once | ORAL | Status: DC

## 2015-09-09 NOTE — Evaluation (Signed)
Physical Therapy Evaluation Patient Details Name: [name] MRN: [id number] DOB: 1935-04-12 Today's Date: 09/09/2015   History of Present Illness  pt presents with SOB and COPD Exacerbation.  pt with hx of COPD, Emphysema, HTN, and CAD.    Clinical Impression  Pt very SOB simply resting in bed with HOB fully elevated.  O2 sats remain in 90's throughout session on 3L O2 and pt indicates she is only able to tolerate sitting EOB at this time due to fatigue and WOB.  Feel pt would benefit from rollator for energy conservation and will continue to follow to further assess mobility and update D/C plan as needed.      Follow Up Recommendations Home health PT;Supervision/Assistance - 24 hour    Equipment Recommendations   (Rollator)    Recommendations for Other Services       Precautions / Restrictions Precautions Precautions: Fall Precaution Comments: Watch O2 sats Restrictions Weight Bearing Restrictions: No      Mobility  Bed Mobility Overal bed mobility: Needs Assistance Bed Mobility: Supine to Sit;Sit to Supine     Supine to sit: Min assist;HOB elevated Sit to supine: Min assist;HOB elevated   General bed mobility comments: pt keeping HOB elevated due to SOB.  A to scoot hips towards EOB and A with bringing LEs back into bed.    Transfers                    Ambulation/Gait                Stairs            Wheelchair Mobility    Modified Rankin (Stroke Patients Only)       Balance Overall balance assessment: Needs assistance Sitting-balance support: Single extremity supported;Feet supported Sitting balance-Leahy Scale: Fair Sitting balance - Comments: pt with increased WOB sitting EOB and seems to use UE support for energy con servation more than balance.                                       Pertinent Vitals/Pain Pain Assessment: No/denies pain    Home Living Family/patient expects to be discharged to:: Private  residence Living Arrangements: Spouse/significant other Available Help at Discharge: Family;Available 24 hours/day Type of Home: House Home Access: Stairs to enter Entrance Stairs-Rails: Right Entrance Stairs-Number of Steps: 2 Home Layout: One level Home Equipment:  (3 Wheeled RW)      Prior Function Level of Independence: Needs assistance   Gait / Transfers Assistance Needed: Uses RW and only ambulates short distances.  ADL's / Homemaking Assistance Needed: Performs own ADLs, but husband A with homemaking tasks.          Hand Dominance        Extremity/Trunk Assessment   Upper Extremity Assessment: Defer to OT evaluation           Lower Extremity Assessment: Generalized weakness (pt indicates cramps in Bil LEs.)      Cervical / Trunk Assessment: Normal  Communication   Communication:  (SOB limiting communication.)  Cognition Arousal/Alertness: Awake/alert Behavior During Therapy: WFL for tasks assessed/performed Overall Cognitive Status: Within Functional Limits for tasks assessed                      General Comments      Exercises        Assessment/Plan  PT Assessment Patient needs continued PT services  PT Diagnosis Difficulty walking;Generalized weakness   PT Problem List Decreased strength;Decreased activity tolerance;Decreased balance;Decreased mobility;Decreased knowledge of use of DME;Cardiopulmonary status limiting activity  PT Treatment Interventions DME instruction;Gait training;Stair training;Functional mobility training;Therapeutic activities;Therapeutic exercise;Balance training;Patient/family education   PT Goals (Current goals can be found in the Care Plan section) Acute Rehab PT Goals Patient Stated Goal: Breathe better PT Goal Formulation: With patient Time For Goal Achievement: 09/23/15 Potential to Achieve Goals: Good    Frequency Min 3X/week   Barriers to discharge        Co-evaluation               End  of Session Equipment Utilized During Treatment: Oxygen Activity Tolerance: Patient limited by fatigue Patient left: in bed;with call bell/phone within reach Nurse Communication: Mobility status         Time: 1610-9604 PT Time Calculation (min) (ACUTE ONLY): 14 min   Charges:   PT Evaluation $PT Eval Moderate Complexity: 1 Procedure     PT G CodesSunny Schlein, [place] [phone] 09/09/2015, 12:04 PM

## 2015-09-09 NOTE — H&P (Signed)
[facility] History and Physical  DANIELL PARADISE ZOX:[id number] DOB: [birth year] DOA: 09/08/2015  Referring physician: ED physician PCP: [name], MD  Specialists: Dr. Delton Coombes (pulmonology), Dr. Delton See (cardiology)   Chief Complaint:  Acute SOB  HPI: [name] is a 81 y.o. female with PMH of COPD on 3 L/m supplemental oxygen at home, coronary artery disease, and hypertension who presents to the ED via EMS with acute onset of dyspnea at rest. Patient reports pain in her usual state of health when going to sleep last night, but woke this morning with some slight malaise. Over the course of the day she had exertional dyspnea that was slightly more pronounced than her baseline, but it was not until this evening that she experienced acute worsening with dyspnea at rest. She denies any preceding fevers, chills, rhinorrhea, or sore throat. There's been no long distance travel or sick contacts. She has no unilateral lower extremity swelling or tenderness. There is no chest pain or palpitations associated with this illness. She has experienced similar symptoms previously with exacerbations of her COPD. She has used nebulized breathing treatments at home with no appreciable improvement. She activated EMS for transport to the emergency department. Nebulized breathing treatment and 125 mg IV Solu-Medrol were administered en route.  In ED, patient was found to to be afebrile, saturating in the low 80s on 3 L/m supplemental oxygen. She was tachycardic in the low 100s and tachypneic on presentation. Her blood pressure was normal. Chest x-ray was negative for acute cardiopulmonary disease and initial blood work featured a leukocytosis to 17,000. Sinus tachycardia and minimal depression of the ST segment in inferior noted on EKG. Troponin is borderline elevated at a value of 0.04. ABG is obtained and notable for a pH of 7.26 and pCO2 of 79. Patient was given a continuous nebulized breathing treatment with  only minimal improvement. 2 g of magnesium were administered. Levaquin was given empirically. She was placed on BiPAP in the emergency department, remained hemodynamically stable, and will be admitted to the stepdown unit for ongoing evaluation and management of acute respiratory failure with hypoxia and hypercarbia suspected secondary to acute exacerbation in COPD.   Where does patient live?   At home     Can patient participate in ADLs?  Yes        Review of Systems:   General: no fevers, chills, sweats, weight change, poor appetite, or fatigue HEENT: no blurry vision, hearing changes or sore throat Pulm:  No wheeze. Non-productive cough, dyspnea at rest CV: no chest pain or palpitations Abd: no nausea, vomiting, abdominal pain, diarrhea, or constipation GU: no dysuria, hematuria, increased urinary frequency, or urgency  Ext: no leg edema Neuro: no focal weakness, numbness, or tingling, no vision change or hearing loss Skin: no rash, no wounds MSK: No muscle spasm, no deformity, no red, hot, or swollen joint Heme: No easy bruising or bleeding Travel history: No recent long distant travel    Allergy:  Allergies  Allergen Reactions  . Penicillins Hives    Has taken it since.  . Pneumococcal Vaccine Swelling    Severe localized swelling in arm  . Pneumovax [Pneumococcal Polysaccharide Vaccine] Hives    Past Medical History  Diagnosis Date  . PVD (peripheral vascular disease) (HCC)   . Actinomycosis, cervicofacial   . Emphysema   . COPD (chronic obstructive pulmonary disease) (HCC)   . HTN (hypertension)   . Hx of echocardiogram     Echocardiogram 04/16/12: Mild focal basal septal  hypertrophy, EF 70-75% with dynamic obstruction with mid cavity obliteration, grade 1 diastolic dysfunction, MAC, atrial septal thickening with findings consistent with lipomatous hypertrophy  . Hyperlipidemia   . CAD (coronary artery disease)     a. coronary calcification on prior Chest CT;  b. Dob MV  1/14:  EF 85%, no ischemia    Past Surgical History  Procedure Laterality Date  . Appendectomy    . Tonsillectomy    . Bone graft hip iliac crest      Social History:  reports that she quit smoking about 10 years ago. Her smoking use included Cigarettes. She has a 49 pack-year smoking history. She has never used smokeless tobacco. She reports that she does not drink alcohol or use illicit drugs.  Family History:  Family History  Problem Relation Age of Onset  . Coronary artery disease      family hx of female 1st. degree relative <50/family hx of 1st. degree relative <60  . Emphysema Father   . Heart disease Father   . Stomach cancer Father   . Emphysema      3 sisters. they were smokers  . Stomach cancer Sister   . Emphysema Sister   . Heart disease Mother   . Coronary artery disease Brother      Prior to Admission medications   Medication Sig Start Date End Date Taking? Authorizing Provider  albuterol (PROVENTIL HFA;VENTOLIN HFA) 108 (90 BASE) MCG/ACT inhaler Inhale 2 puffs into the lungs every 6 (six) hours as needed. For shortness of breath   Yes Historical Provider, MD  albuterol (PROVENTIL) (2.5 MG/3ML) 0.083% nebulizer solution Take 3 mLs (2.5 mg total) by nebulization every 6 (six) hours as needed for wheezing or shortness of breath. 05/14/15  Yes Leslye Peer, MD  aspirin 81 MG tablet Take 81 mg by mouth daily.     Yes Historical Provider, MD  atorvastatin (LIPITOR) 10 MG tablet Take 10 mg by mouth daily.     Yes Historical Provider, MD  budesonide-formoterol (SYMBICORT) 160-4.5 MCG/ACT inhaler INHALE 2 PUFFS INTO THE LUNGS 2 (TWO) TIMES DAILY. 08/28/15  Yes Leslye Peer, MD  Multiple Vitamin (MULTIVITAMIN) tablet Take 1 tablet by mouth daily.     Yes Historical Provider, MD  nebivolol (BYSTOLIC) 5 MG tablet Take 1 tablet (5 mg total) by mouth daily. 06/17/15  Yes Lars Masson, MD  niacin (NIASPAN) 1000 MG CR tablet Take 1,000 mg by mouth daily.   Yes Historical  Provider, MD  Tiotropium Bromide Monohydrate (SPIRIVA RESPIMAT) 2.5 MCG/ACT AERS INHALE 2 PUFFS INTO THE LUNGS DAILY. 08/28/15  Yes Leslye Peer, MD  famotidine (PEPCID) 20 MG tablet Take 1 tablet 2 times daily for 4 weeks then as needed Patient not taking: Reported on 09/08/2015 07/23/12   Beatrice Lecher, PA-C  UNABLE TO FIND Inhale 3 L into the lungs continuous.    Historical Provider, MD    Physical Exam: Filed Vitals:   09/08/15 2330 09/08/15 2345 09/09/15 0000 09/09/15 0015  BP: 132/96 145/86 134/72 134/70  Pulse: 114 112 110 104  Temp:      TempSrc:      Resp: SpO2: 100% 100% 100% 100%   General: In acute respiratory distress with tachypnea and accessory muscle recruitment on BiPAP HEENT:       Eyes: PERRL, EOMI, no scleral icterus or conjunctival pallor.       ENT: No discharge from the ears or nose, no pharyngeal  ulcers, petechiae or exudate.        Neck: No JVD, no bruit, no appreciable mass Heme: No cervical adenopathy, no pallor Cardiac: Rate ~120 and regular, No murmurs, No gallops or rubs. Pulm: Markedly diminished b/l. Tachypneic, recruiting accessory muscles, on BiPAP Abd: Soft, nondistended, nontender, no rebound pain or gaurding, no mass or organomegaly, BS present. Ext: No LE edema bilaterally. 2+DP/PT pulse bilaterally. Musculoskeletal: No gross deformity, no red, hot, swollen joints   Skin: No rashes or wounds on exposed surfaces  Neuro: Alert, oriented X3, cranial nerves II-XII grossly intact. No focal findings Psych: Patient is not overtly psychotic, appropriate mood and affect.  Labs on Admission:  Basic Metabolic Panel:  Recent Labs Lab 09/08/15 2220  NA 139  K 4.3  CL 97*  CO2 27  GLUCOSE 179*  BUN 14  CREATININE 0.85  CALCIUM 9.6   Liver Function Tests:  Recent Labs Lab 09/08/15 2220  AST 22  ALT 20  ALKPHOS 46  BILITOT 0.2*  PROT 6.9  ALBUMIN 4.1   No results for input(s): LIPASE, AMYLASE in the last 168 hours. No  results for input(s): AMMONIA in the last 168 hours. CBC:  Recent Labs Lab 09/08/15 2220  WBC 17.2*  NEUTROABS 12.6*  HGB 12.2  HCT 37.8  MCV 94.3  PLT 353   Cardiac Enzymes:  Recent Labs Lab 09/08/15 2220  TROPONINI 0.04*    BNP (last 3 results) No results for input(s): BNP in the last 8760 hours.  ProBNP (last 3 results) No results for input(s): PROBNP in the last 8760 hours.  CBG: No results for input(s): GLUCAP in the last 168 hours.  Radiological Exams on Admission: Dg Chest Port 1 View  09/08/2015  CLINICAL DATA:  81 year old female with respiratory distress. EXAM: PORTABLE CHEST 1 VIEW COMPARISON:  CT dated 11/26/2014 FINDINGS: Single-view of the chest demonstrates emphysematous changes of the lungs. There is no focal consolidation, pleural effusion, or pneumothorax. The cardiac silhouette is within normal limits. No acute osseous pathology. IMPRESSION: No active disease. Electronically Signed   By: Elgie Collard M.D.   On: 09/08/2015 22:01    EKG: Independently reviewed.  Abnormal findings:  Sinus tachycardia (rate 112), minimal ST-depression in inferior leads   Assessment/Plan  1. Acute on chronic respiratory failure with hypoxia and hypercarbia  - Secondary to acute exacerbation in COPD  - ABG in ED with pH 7.26 and pCO2 79  - Received 125 mg IV Solu-Medrol and continuous neb en route  - Levaquin and 2g IV mag given in ED and pt placed on BiPAP  - Blood gas improved after 2hrs on BiPAP, will transition back to nasal canula and wean FiO2 as tolerated  - Continue DuoNeb scheduled q6h, also available q2h prn  - Continue empiric Levaquin for suspected infectious precipitant  - Continue Solu-Medrol with 60 mg IV q6h, to be converted to PO and tapered when appropriate  - Concern for possible contribution from acute coronary disease, evaluating and managing this as below   2. COPD with acute exacerbation  - On 3 Lpm supplemental O2 atc at home  - Satting low  80s on 3 Lpm in ED, placed on BiPAP when ABG returned with pH 7.26 and pCO2 79  - Improved on BiPAP, will return to nasal canula as tolerated  - Received 125 mg IV Solu-Medrol en route, continuing with 60 mg IV q6h  - DuoNebs q6h scheduled, and q2h prn  - Monitor with continuous pulse oximetry and titrate  FiO2 to maintain sat >92%    3. CAD with concern for ACS  - History of CAD followed by Dr. Delton See of cardiology  - Admission EKG features minimal, sub-mm ST-depression in inferior leads  - Initial troponin is 0.04  - There is no chest pain per se, but chest tightness as is common with COPD exacerbation  - Monitor on telemetry for ischemic changes  - Trend troponin  - Repeat EKG in am, sooner if CP develops  - ASA 324 mg chew to be given now  - Continue pt's home beta-blocker, statin   4. Leukocytosis  - No other evidence of infectious process at this time  - Could be secondary to stress demargination, steroid given en route, or infection  - Continue empiric Levaquin as above  - Monitor   DVT ppx:  SQ Lovenox      Code Status: Full code Family Communication:  Yes, patient's daughter at bed side Disposition Plan: Admit to inpatient   Date of Service 09/09/2015    Briscoe Deutscher, MD [facility] Pager [phone]  If 7PM-7AM, please contact night-coverage [email or web address] Password [email or web address] 09/09/2015, 1:25 AM

## 2015-09-09 NOTE — ED Notes (Signed)
Pt CBG, 139. Nurse was notified.

## 2015-09-09 NOTE — ED Notes (Signed)
Attempted report 

## 2015-09-09 NOTE — Progress Notes (Signed)
Pharmacy Antibiotic Note  [name] is a 81 y.o. female admitted on 09/08/2015 with shortness of breath.  Pharmacy has been consulted for Levaquin dosing.  WBC is elevated, CXR unremarkable, Scr 0.85  Plan: -Levaquin 750 mg IV q48h -F/U infectious work-up   Temp (24hrs), Avg:97.7 F (36.5 C), Min:97.7 F (36.5 C), Max:97.7 F (36.5 C)   Recent Labs Lab 09/08/15 2220  WBC 17.2*  CREATININE 0.85    CrCl cannot be calculated (Unknown ideal weight.).    Allergies  Allergen Reactions  . Penicillins Hives    Has taken it since.  . Pneumococcal Vaccine Swelling    Severe localized swelling in arm  . Pneumovax [Pneumococcal Polysaccharide Vaccine] Hives    [name] 09/09/2015 1:31 AM

## 2015-09-09 NOTE — ED Notes (Signed)
[facility] ordered @ [phone].

## 2015-09-09 NOTE — Progress Notes (Addendum)
[facility] TEAM 1 - Stepdown/ICU TEAM PROGRESS NOTE  [name] ZOX:[id number] DOB: Jan 27, 1935 DOA: 09/08/2015 PCP: [name], MD  Admit HPI / Brief Narrative: 81 y.o. female with Hx of COPD on 3 L/m supplemental oxygen at home, coronary artery disease, and hypertension who presented to the ED via EMS with acute onset of dyspnea at rest. Patient woke with some slight malaise. Over the course of the day she had exertional dyspnea that was slightly more pronounced than her baseline, but it was not until the evening when she experienced acute worsening with dyspnea at rest. She denied any preceding fevers, chills, rhinorrhea, or sore throat. She activated EMS for transport to the emergency department. Nebulized breathing treatment and 125 mg IV Solu-Medrol were administered en route.  In ED, patient was found to to be afebrile, saturating in the low 80s on 3 L/m supplemental oxygen. She was tachycardic in the low 100s and tachypneic on presentation. Her blood pressure was normal. Chest x-ray was negative for acute cardiopulmonary disease and initial blood work featured a leukocytosis to 17,000. Sinus tachycardia and minimal depression of the ST segment in inferior noted on EKG. Troponin was borderline elevated at 0.04. ABG notable for a pH of 7.26 and pCO2 of 79. Patient was given a continuous nebulized breathing treatment with only minimal improvement. 2 g of magnesium were administered. Levaquin was given empirically. She was placed on BiPAP in the emergency department.  HPI/Subjective: Pt seen for f/u visit.  Assessment/Plan:  Acute on chronic respiratory failure with hypoxia and hypercarbia due to COPD exacerbation - On 3 Lpm supplemental O2 at home   Mildly elevated Troponin w/ Hx of CAD  Trop has climbed to 0.46 thus far - no acute findings on EKG - no chest pain - cont to follow trop - recheck EKG in AM  Leukocytosis   Code Status: FULL Family Communication: spoke w/ husband  at bedside  Disposition Plan: SDU  Consultants: none  Procedures: none  Antibiotics: Levaquin 2/28 >  DVT prophylaxis: lovenox   Objective: Blood pressure 151/69, pulse 93, temperature 97.4 F (36.3 C), temperature source Oral, resp. rate 27, height  (1.549 m), weight 60.8 kg (134 lb 0.6 oz), SpO2 100 %.  Intake/Output Summary (Last 24 hours) at 09/09/15 1731 Last data filed at 09/09/15 1300  Gross per 24 hour  Intake      3 ml  Output    750 ml  Net   -747 ml   Exam: Pt seen for f/u visit.  Data Reviewed: Basic Metabolic Panel:  Recent Labs Lab 09/08/15 2220  NA 139  K 4.3  CL 97*  CO2 27  GLUCOSE 179*  BUN 14  CREATININE 0.85  CALCIUM 9.6    CBC:  Recent Labs Lab 09/08/15 2220  WBC 17.2*  NEUTROABS 12.6*  HGB 12.2  HCT 37.8  MCV 94.3  PLT 353    Liver Function Tests:  Recent Labs Lab 09/08/15 2220  AST 22  ALT 20  ALKPHOS 46  BILITOT 0.2*  PROT 6.9  ALBUMIN 4.1    Coags:  Recent Labs Lab 09/09/15 0255  INR 0.91   Cardiac Enzymes:  Recent Labs Lab 09/08/15 2220 09/09/15 0255 09/09/15 0731 09/09/15 1457  TROPONINI 0.04* 0.33* 0.40* 0.46*    CBG:  Recent Labs Lab 09/09/15 0844  GLUCAP 139*    Recent Results (from the past 240 hour(s))  MRSA PCR Screening     Status: None   Collection Time: 09/09/15  12:53 PM  Result Value Ref Range Status   MRSA by PCR NEGATIVE NEGATIVE Final    Comment:        The GeneXpert MRSA Assay (FDA approved for NASAL specimens only), is one component of a comprehensive MRSA colonization surveillance program. It is not intended to diagnose MRSA infection nor to guide or monitor treatment for MRSA infections.      Studies:   Recent x-ray studies have been reviewed in detail by the Attending Physician  Scheduled Meds:  Scheduled Meds: . aspirin  324 mg Oral Once  . aspirin EC  81 mg Oral Daily  . atorvastatin  10 mg Oral Daily  . enoxaparin (LOVENOX) injection  40 mg  Subcutaneous Q24H  . ipratropium  0.5 mg Nebulization Q6H  . levalbuterol  0.63 mg Nebulization Q6H  . [START ON 09/10/2015] levofloxacin (LEVAQUIN) IV  750 mg Intravenous Q48H  . methylPREDNISolone (SOLU-MEDROL) injection  60 mg Intravenous Q6H  . mometasone-formoterol  2 puff Inhalation BID  . multivitamin with minerals  1 tablet Oral Daily  . nebivolol  5 mg Oral Daily  . niacin  1,000 mg Oral Daily  . sodium chloride flush  3 mL Intravenous Q12H    Time spent on care of this patient: No charge   Lonia Blood , MD   [facility] Office  [phone] Pager - Text Page per Amion as per below:  On-Call/Text Page:      [email or web address]      password [email or web address]  If 7PM-7AM, please contact night-coverage [email or web address] Password [email or web address] 09/09/2015, 5:31 PM   LOS: 0 days

## 2015-09-09 NOTE — ED Notes (Signed)
Called Pharmacy for medications due.

## 2015-09-10 DIAGNOSIS — R7989 Other specified abnormal findings of blood chemistry: Secondary | ICD-10-CM

## 2015-09-10 DIAGNOSIS — D72829 Elevated white blood cell count, unspecified: Secondary | ICD-10-CM

## 2015-09-10 DIAGNOSIS — R778 Other specified abnormalities of plasma proteins: Secondary | ICD-10-CM | POA: Diagnosis present

## 2015-09-10 DIAGNOSIS — J441 Chronic obstructive pulmonary disease with (acute) exacerbation: Secondary | ICD-10-CM | POA: Diagnosis present

## 2015-09-10 LAB — COMPREHENSIVE METABOLIC PANEL
ALT: 21 U/L (ref 14–54)
AST: 26 U/L (ref 15–41)
Albumin: 3.6 g/dL (ref 3.5–5.0)
Alkaline Phosphatase: 36 U/L — ABNORMAL LOW (ref 38–126)
Anion gap: 11 (ref 5–15)
BUN: 18 mg/dL (ref 6–20)
CHLORIDE: 98 mmol/L — AB (ref 101–111)
CO2: 26 mmol/L (ref 22–32)
CREATININE: 0.75 mg/dL (ref 0.44–1.00)
Calcium: 9 mg/dL (ref 8.9–10.3)
GFR calc Af Amer: >60 mL/min (ref >60)
Glucose, Bld: 129 mg/dL — ABNORMAL HIGH (ref 65–99)
POTASSIUM: 4.4 mmol/L (ref 3.5–5.1)
SODIUM: 135 mmol/L (ref 135–145)
Total Bilirubin: 0.4 mg/dL (ref 0.3–1.2)
Total Protein: 5.7 g/dL — ABNORMAL LOW (ref 6.5–8.1)

## 2015-09-10 LAB — CBC
HEMATOCRIT: 33.4 % — AB (ref 36.0–46.0)
Hemoglobin: 11.3 g/dL — ABNORMAL LOW (ref 12.0–15.0)
MCH: 31.6 pg (ref 26.0–34.0)
MCHC: 33.8 g/dL (ref 30.0–36.0)
MCV: 93.3 fL (ref 78.0–100.0)
PLATELETS: 291 10*3/uL (ref 150–400)
RBC: 3.58 MIL/uL — AB (ref 3.87–5.11)
RDW: 12.6 % (ref 11.5–15.5)
WBC: 15.3 10*3/uL — AB (ref 4.0–10.5)

## 2015-09-10 LAB — TROPONIN I
TROPONIN I: 0.61 ng/mL — AB (ref <0.031)
Troponin I: 0.4 ng/mL — ABNORMAL HIGH (ref <0.031)

## 2015-09-10 MED ORDER — TIOTROPIUM BROMIDE MONOHYDRATE 18 MCG IN CAPS
18.0000 ug | ORAL_CAPSULE | Freq: Every day | RESPIRATORY_TRACT | Status: DC
  Administered 2015-09-10 – 2015-09-15 (×6): 18 ug via RESPIRATORY_TRACT
  Filled 2015-09-10 (×2): qty 5

## 2015-09-10 MED ORDER — LORAZEPAM 1 MG PO TABS
1.0000 mg | ORAL_TABLET | Freq: Once | ORAL | Status: AC
  Administered 2015-09-10: 1 mg via ORAL
  Filled 2015-09-10: qty 1

## 2015-09-10 MED ORDER — ALBUTEROL SULFATE (2.5 MG/3ML) 0.083% IN NEBU: 2.5000 mg | INHALATION_SOLUTION | RESPIRATORY_TRACT | Status: DC | PRN

## 2015-09-10 MED ORDER — MOMETASONE FURO-FORMOTEROL FUM 200-5 MCG/ACT IN AERO
2.0000 | INHALATION_SPRAY | Freq: Two times a day (BID) | RESPIRATORY_TRACT | Status: DC
  Administered 2015-09-10 – 2015-09-12 (×4): 2 via RESPIRATORY_TRACT
  Filled 2015-09-10: qty 8.8

## 2015-09-10 MED ORDER — METHYLPREDNISOLONE SODIUM SUCC 125 MG IJ SOLR
60.0000 mg | INTRAMUSCULAR | Status: AC
  Administered 2015-09-11: 60 mg via INTRAVENOUS
  Filled 2015-09-10: qty 2

## 2015-09-10 MED ORDER — LEVOFLOXACIN 750 MG PO TABS
750.0000 mg | ORAL_TABLET | ORAL | Status: DC
  Administered 2015-09-10 – 2015-09-11 (×2): 750 mg via ORAL
  Filled 2015-09-10 (×3): qty 1

## 2015-09-10 MED ORDER — ENSURE ENLIVE PO LIQD
237.0000 mL | Freq: Three times a day (TID) | ORAL | Status: DC
  Administered 2015-09-10 – 2015-09-14 (×8): 237 mL via ORAL

## 2015-09-10 NOTE — Progress Notes (Addendum)
Physician notified: Joseph Art At: [phone]  Regarding:  pt refusing nebs-but needs nebs very wheezy and SOB, SpO2 98% on 3LPM. Asking for inhalers instead. also antianxiety meds. RT suggests proair, symbicort, spiriva.   Physician notified: Joseph Art At: [phone]  Regarding:  taking nebs now d/t work of breathing/anxiety. pt could use Xanax/Ativan PRN. Will continue to monitor patient closely.   Orders for one time dose ativan PO placed by MD.

## 2015-09-10 NOTE — Progress Notes (Signed)
[place] TEAM 1 - Stepdown/ICU TEAM Progress Note  [name] NWG:[id number] DOB: [birth year] DOA: 09/08/2015 PCP: [name], MD  Admit HPI / Brief Narrative:T 81 y.o. female PMHx COPD on 3 L/m supplemental oxygen at home, CAD native artery, HTN, HLD,  Presented to the ED via EMS with acute onset of dyspnea at rest. Patient woke with some slight malaise. Over the course of the day she had exertional dyspnea that was slightly more pronounced than her baseline, but it was not until the evening when she experienced acute worsening with dyspnea at rest. She denied any preceding fevers, chills, rhinorrhea, or sore throat. She activated EMS for transport to the emergency department. Nebulized breathing treatment and 125 mg IV Solu-Medrol were administered en route.  In ED, patient was found to to be afebrile, saturating in the low 80s on 3 L/m supplemental oxygen. She was tachycardic in the low 100s and tachypneic on presentation. Her blood pressure was normal. Chest x-ray was negative for acute cardiopulmonary disease and initial blood work featured a leukocytosis to 17,000. Sinus tachycardia and minimal depression of the ST segment in inferior noted on EKG. Troponin was borderline elevated at 0.04. ABG notable for a pH of 7.26 and pCO2 of 79. Patient was given a continuous nebulized breathing treatment with only minimal improvement. 2 g of magnesium were administered. Levaquin was given empirically. She was placed on BiPAP in the emergency department.   HPI/Subjective:  3/2 A/O 4, States does not use home albuterol nebulizer secondary to tachycardia, but is able to use inhaler.  Assessment/Plan: Acute on chronic respiratory failure with hypoxia and hypercarbia due to COPD exacerbation - On 3 Lpm supplemental O2 at home; currently SPO2 100% -Titrate O2 to maintain SPO2 89-94%  -Decrease Solu-Medrol 60 mg daily -Patient becomes tachycardic on bronchodilator nebulizers (part of this  reaction could be secondary to medication, but patient very anxious), DC Atrovent, Xopenex. Patient significantly improved restart home medication -Albuterol inhaler PRN SOB -Start Dulera for Symbicort 160-4.5 BID -Spiriva daily -Complete levofloxacin seven-day course  Mildly elevated Troponin w/ Hx of CAD  -Trop has climbed to 0.46 , peaked at 0.61 trending down   - no acute findings on EKG  - Asymptomatic - Echocardiogram pending   Leukocytosis  -Most likely reactive, continued follow   Code Status: FULL Family Communication: Husband present at time of exam Disposition Plan: DC in next 24-48 hours    Consultants: NA  Procedure/Significant Events:    Culture 3/1 MRSA by PCR negative   Antibiotics: Levofloxacin 2/28>>   DVT prophylaxis: Lovenox   Devices NA   LINES / TUBES:  NA    Continuous Infusions:   Objective: VITAL SIGNS: Temp: 98.3 F (36.8 C) (03/02 1951) Temp Source: Oral (03/02 1951) BP: 130/64 mmHg (03/02 1951) Pulse Rate: 87 (03/02 1951) SPO2; FIO2:   Intake/Output Summary (Last 24 hours) at 09/10/15 2115 Last data filed at 09/10/15 1415  Gross per 24 hour  Intake    600 ml  Output    625 ml  Net    -25 ml     Exam: General: A/O 4, NAD, positive chronic respiratory distress Eyes: Negative headache, negative scleral hemorrhage ENT: Negative Runny nose, negative gingival bleeding, Neck:  Negative scars, masses, torticollis, lymphadenopathy, JVD Lungs: diffuse decreased breath sounds, most likely back to baseline, negative wheezes or crackles Cardiovascular: Regular rate and rhythm without murmur gallop or rub normal S1 and S2 Abdomen:negative abdominal pain, nondistended, positive soft, bowel sounds, no rebound,  no ascites, no appreciable mass Extremities: No significant cyanosis, clubbing, or edema bilateral lower extremities Psychiatric:  Negative depression, negative anxiety, negative fatigue, negative mania  Neurologic:   Cranial nerves II through XII intact, tongue/uvula midline, all extremities muscle strength 5/5, sensation intact throughout, negative dysarthria, negative expressive aphasia, negative receptive aphasia.   Data Reviewed: Basic Metabolic Panel:  Recent Labs Lab 09/08/15 2220 09/10/15 0755  NA 139 135  K 4.3 4.4  CL 97* 98*  CO2 27 26  GLUCOSE 179* 129*  BUN 14 18  CREATININE 0.85 0.75  CALCIUM 9.6 9.0   Liver Function Tests:  Recent Labs Lab 09/08/15 2220 09/10/15 0755  AST 22 26  ALT 20 21  ALKPHOS 46 36*  BILITOT 0.2* 0.4  PROT 6.9 5.7*  ALBUMIN 4.1 3.6   No results for input(s): LIPASE, AMYLASE in the last 168 hours. No results for input(s): AMMONIA in the last 168 hours. CBC:  Recent Labs Lab 09/08/15 2220 09/10/15 0755  WBC 17.2* 15.3*  NEUTROABS 12.6*  --   HGB 12.2 11.3*  HCT 37.8 33.4*  MCV 94.3 93.3  PLT 353 291   Cardiac Enzymes:  Recent Labs Lab 09/09/15 0731 09/09/15 1457 09/09/15 1915 09/10/15 0020 09/10/15 0755  TROPONINI 0.40* 0.46* 0.54* 0.61* 0.40*   BNP (last 3 results)  Recent Labs  09/09/15 0255  BNP 58.0    ProBNP (last 3 results) No results for input(s): PROBNP in the last 8760 hours.  CBG:  Recent Labs Lab 09/09/15 0844  GLUCAP 139*    Recent Results (from the past 240 hour(s))  MRSA PCR Screening     Status: None   Collection Time: 09/09/15 12:53 PM  Result Value Ref Range Status   MRSA by PCR NEGATIVE NEGATIVE Final    Comment:        The GeneXpert MRSA Assay (FDA approved for NASAL specimens only), is one component of a comprehensive MRSA colonization surveillance program. It is not intended to diagnose MRSA infection nor to guide or monitor treatment for MRSA infections.      Studies:  Recent x-ray studies have been reviewed in detail by the Attending Physician  Scheduled Meds:  Scheduled Meds: . aspirin  324 mg Oral Once  . aspirin EC  81 mg Oral Daily  . atorvastatin  10 mg Oral Daily    . enoxaparin (LOVENOX) injection  40 mg Subcutaneous Q24H  . feeding supplement (ENSURE ENLIVE)  237 mL Oral TID BM  . levofloxacin  750 mg Oral Q24H  . [START ON 09/11/2015] methylPREDNISolone (SOLU-MEDROL) injection  60 mg Intravenous Q24H  . mometasone-formoterol  2 puff Inhalation BID  . multivitamin with minerals  1 tablet Oral Daily  . nebivolol  5 mg Oral Daily  . niacin  1,000 mg Oral Daily  . sodium chloride flush  3 mL Intravenous Q12H  . tiotropium  18 mcg Inhalation Daily    Time spent on care of this patient: 40 mins   WOODS, Roselind Messier , MD  [facility] Office  [phone] Pager - [phone]  On-Call/Text Page:      [email or web address]      password [email or web address]  If 7PM-7AM, please contact night-coverage [email or web address] Password [email or web address] 09/10/2015, 9:15 PM   LOS: 1 day   Care during the described time interval was provided by me .  I have reviewed this patient's available data, including medical history, events of note, physical examination, and all test results as part of my evaluation. I have personally  reviewed and interpreted all radiology studies.   Dia Crawford, MD [phone] Pager

## 2015-09-10 NOTE — Progress Notes (Signed)
Pt and spouse have refused all nebulized treatments this morning. Spouse states they make her worse than she is now. (Maybe anxiety related.) Pt wants inhalers only and states that she needs them now. She uses Symbicort, Proair, and Sprivia at home. Rn notified and MD paged.

## 2015-09-10 NOTE — Progress Notes (Signed)
Initial Nutrition Assessment  DOCUMENTATION CODES:   Non-severe (moderate) malnutrition in context of chronic illness  INTERVENTION:    Mighty Shakes with meals TID.  Ensure Enlive PO TID, each supplement provides 350 kcal and 20 grams of protein.  NUTRITION DIAGNOSIS:   Inadequate oral intake related to other (see comment) (difficulty breathing) as evidenced by per patient/family report, meal completion < 25%.  GOAL:   Patient will meet greater than or equal to 90% of their needs  MONITOR:   PO intake, Supplement acceptance, Labs, Weight trends, I & O's  REASON FOR ASSESSMENT:   Consult COPD Protocol  ASSESSMENT:   81 y.o. female with PMH of COPD on 3 L/m supplemental oxygen at home, coronary artery disease, and hypertension who presents to the ED via EMS with acute onset of dyspnea at rest.   Patient having trouble breathing during RD visit. Received most of nutrition hx from her husband who was sitting at her bedside. Patient typically has a good appetite and good intake, but over the past few days she has been eating poorly due to difficulty breathing. Nutrition-Focused physical exam completed. Findings are mild-moderate fat depletion, mild-moderate and severe muscle depletion, and no edema. Husband brought her in a [employer] drink this AM, patient drank it, but ate nothing else for breakfast. She is willing to try any PO supplement. Will add Mighty Shakes with meals and Ensure Enlive between meals.   Diet Order:  Diet Heart Room service appropriate?: Yes; Fluid consistency:: Thin  Skin:  Reviewed, no issues  Last BM:  unknown  Height:   Ht Readings from Last 1 Encounters:  09/09/15  (1.549 m)    Weight:   Wt Readings from Last 1 Encounters:  09/09/15 134 lb 0.6 oz (60.8 kg)    Ideal Body Weight:  47.7 kg  BMI:  Body mass index is 25.34 kg/(m^2).  Estimated Nutritional Needs:   Kcal:  1600-1800  Protein:  80-95 gm  Fluid:  >/=  1.6 L  EDUCATION NEEDS:   Education needs no appropriate at this time  Joaquin Courts, RD, LDN, CNSC Pager [phone] After Hours Pager [phone]

## 2015-09-11 LAB — GLUCOSE, CAPILLARY: Glucose-Capillary: 110 mg/dL — ABNORMAL HIGH (ref 65–99)

## 2015-09-11 MED ORDER — PREDNISONE 10 MG PO TABS
30.0000 mg | ORAL_TABLET | Freq: Two times a day (BID) | ORAL | Status: AC
  Administered 2015-09-12 (×2): 30 mg via ORAL
  Filled 2015-09-11 (×2): qty 1

## 2015-09-11 MED ORDER — DIPHENHYDRAMINE HCL 25 MG PO CAPS
25.0000 mg | ORAL_CAPSULE | Freq: Once | ORAL | Status: AC
  Administered 2015-09-11: 25 mg via ORAL
  Filled 2015-09-11: qty 1

## 2015-09-11 MED ORDER — ALBUTEROL SULFATE (2.5 MG/3ML) 0.083% IN NEBU: 2.5000 mg | INHALATION_SOLUTION | RESPIRATORY_TRACT | Status: DC | PRN

## 2015-09-11 NOTE — Progress Notes (Signed)
OT Cancellation Note  Patient Details Name: [name] MRN: [id number] DOB: Mar 11, 1935   Cancelled Treatment:    Reason Eval/Treat Not Completed: Other (comment) Pt eating lunch. Will return.  [facility] Center[facility]ARY  [place] Stegemann, OTR/L  [phone] 09/11/2015 09/11/2015, 2:08 PM

## 2015-09-11 NOTE — Care Management Important Message (Signed)
Important Message  Patient Details  Name: Sheri SleeperHazel H Dowse MRN: [id number] Date of Birth: 09-29-1934   Medicare Important Message Given:  Yes    Domnique Vantine Abena 09/11/2015, 11:45 AM

## 2015-09-11 NOTE — Care Management Note (Addendum)
Case Management Note  Patient Details  Name: [name] MRN: [id number] Date of Birth: 02-05-1935  Subjective/Objective:   Patient is from home with spouse, she has rolling walker, motorized w/chair, and a scooter.  [facility], [facility]  Soc will begin 24-48 hrs post dc.  Patient also has home oxygen with [facility]. Will be dc to home 3/4, she has transportation home.  Will need orders from MD for HHRN/HHHPT.   Patient states spouse will bring an oxygen tank with him when he come to transport her home.             Action/Plan:   Expected Discharge Date:                  Expected Discharge Plan:  Home w Home Health Services  In-House Referral:     Discharge planning Services  CM Consult  Post Acute Care Choice:  Home Health Choice offered to:     DME Arranged:    DME Agency:     HH Arranged:  RN, PT HH Agency:  [facility]  Status of Service:  Completed, signed off  Medicare Important Message Given:  Yes Date Medicare IM Given:    Medicare IM give by:    Date Additional Medicare IM Given:    Additional Medicare Important Message give by:     If discussed at Long Length of Stay Meetings, dates discussed:    Additional Comments:  Leone Havenaylor, Genni Buske [place], RN 09/11/2015, 3:53 PM

## 2015-09-11 NOTE — Progress Notes (Signed)
Occupational Therapy Evaluation Patient Details Name: [name] MRN: [id number] DOB: 15-Jan-1935 Today's Date: 09/11/2015    History of Present Illness pt presents with SOB and COPD Exacerbation.  pt with hx of COPD, Emphysema, HTN, and CAD.     Clinical Impression   Completed all education regarding energy conservation for ADL and functional mobility for ADL. Pt stated she was too fatigued to attempt ambulation again this pm. Pt safe to D/C home with 24/7 S of husband. OT signing off.     Follow Up Recommendations    24/7 S initially   Equipment Recommendations    none   Recommendations for Other Services       Precautions / Restrictions Precautions Precautions: Fall Precaution Comments: Watch O2 sats Restrictions Weight Bearing Restrictions: No      Mobility Bed Mobility Overal bed mobility: Needs Assistance Bed Mobility: Supine to Sit     Supine to sit: Supervision     General bed mobility comments: OOB in chair  Transfers Overall transfer level: Needs assistance Equipment used: Rolling walker (2 wheeled) Transfers: Sit to/from Stand Sit to Stand: Min guard         General transfer comment: Pt slow to stand and requires verbal cues for upright posture.  Cues for hand placement and proper use of RW.     Balance Overall balance assessment: Needs assistance Sitting-balance support: No upper extremity supported;Feet supported Sitting balance-Leahy Scale: Good     Standing balance support: Bilateral upper extremity supported;During functional activity Standing balance-Leahy Scale: Fair Standing balance comment: RW for support                             ADL Overall ADL's : Needs assistance/impaired                                     Functional mobility during ADLs: Supervision/safety General ADL Comments: Pt able to complete basic ADL but gets very SOB wtih minimal activity. Educated pt on energy conservation  techniques - handout given. Recommended pt consider using rollator.     Vision     Perception     Praxis      Pertinent Vitals/Pain Pain Assessment: No/denies pain     Hand Dominance     Extremity/Trunk Assessment Upper Extremity Assessment Upper Extremity Assessment: Generalized weakness   Lower Extremity Assessment Lower Extremity Assessment: Generalized weakness   Cervical / Trunk Assessment Cervical / Trunk Assessment: Normal   Communication Communication Communication:  (SOB limiting communication.)   Cognition Arousal/Alertness: Awake/alert Behavior During Therapy: WFL for tasks assessed/performed Overall Cognitive Status: Within Functional Limits for tasks assessed                     General Comments       Exercises Exercises: Other exercises Other Exercises Other Exercises: educated on pursed lip breathing   Shoulder Instructions      Home Living Family/patient expects to be discharged to:: Private residence Living Arrangements: Spouse/significant other Available Help at Discharge: Family;Available 24 hours/day Type of Home: House Home Access: Stairs to enter Entergy CorporationEntrance Stairs-Number of Steps: 2 Entrance Stairs-Rails: Right Home Layout: One level     Bathroom Shower/Tub: Producer, television/film/videoWalk-in shower   Bathroom Toilet: Handicapped height Bathroom Accessibility: Yes How Accessible: Accessible via walker Home Equipment: Shower seat - built in;Transport chair;Other (comment) (3 Wheeled RW)  Prior Functioning/Environment Level of Independence: Needs assistance  Gait / Transfers Assistance Needed: Uses RW and only ambulates short distances. ADL's / Homemaking Assistance Needed: Performs own ADLs, but husband A with homemaking tasks.          OT Diagnosis:     OT Problem List:     OT Treatment/Interventions:      OT Goals(Current goals can be found in the care plan section) Acute Rehab OT Goals Patient Stated Goal: Breathe better OT  Goal Formulation: All assessment and education complete, DC therapy  OT Frequency:     Barriers to D/C:            Co-evaluation              End of Session Equipment Utilized During Treatment: Oxygen  Activity Tolerance:   Patient left:     Time: 1610-9604 OT Time Calculation (min): 12 min Charges:  OT General Charges $OT Visit: 1 Procedure OT Evaluation $OT Eval Moderate Complexity: 1 Procedure G-Codes:    Masiah Lewing,HILLARY 2015-10-07, 5:21 PM   [facility], OTR/L  [phone] Oct 07, 2015

## 2015-09-11 NOTE — Progress Notes (Signed)
Physical Therapy Treatment Patient Details Name: Sheri SleeperHazel H Mcdonald MRN: [id number] DOB: 1934-10-11 Today's Date: 09/11/2015    History of Present Illness pt presents with SOB and COPD Exacerbation.  pt with hx of COPD, Emphysema, HTN, and CAD.      PT Comments    [name] was tired this afternoon as she reports she had ambulated around the floor earlier in the day w/ nursing staff.  Pt ambulated 70 ft using RW w/ increased WOB, SpO2 remaining at or above 93% on .5 LPM.  Pt will benefit from continued skilled PT services to increase functional independence and safety.   Follow Up Recommendations  Home health PT;Supervision/Assistance - 24 hour     Equipment Recommendations  Other (comment) (Rollator)    Recommendations for Other Services       Precautions / Restrictions Precautions Precautions: Fall Precaution Comments: Watch O2 sats Restrictions Weight Bearing Restrictions: No    Mobility  Bed Mobility Overal bed mobility: Needs Assistance Bed Mobility: Supine to Sit     Supine to sit: Supervision     General bed mobility comments: No cues or physical assist needed.  Supervision for safety.  Transfers Overall transfer level: Needs assistance Equipment used: Rolling walker (2 wheeled) Transfers: Sit to/from Stand Sit to Stand: Min guard         General transfer comment: Pt slow to stand and requires verbal cues for upright posture.  Cues for hand placement and proper use of RW.   Ambulation/Gait Ambulation/Gait assistance: Min guard Ambulation Distance (Feet): 70 Feet Assistive device: Rolling walker (2 wheeled) Gait Pattern/deviations: Decreased stride length;Antalgic;Trunk flexed   Gait velocity interpretation: Below normal speed for age/gender General Gait Details: Dec gait speed and pt requires 1 standing rest break due to increased WOB.  SpO2 remains at or higher than 93% on .5 LPM O2 while ambulating.   Stairs            Wheelchair Mobility     Modified Rankin (Stroke Patients Only)       Balance Overall balance assessment: Needs assistance Sitting-balance support: No upper extremity supported;Feet supported Sitting balance-Leahy Scale: Good     Standing balance support: Bilateral upper extremity supported;During functional activity Standing balance-Leahy Scale: Fair Standing balance comment: RW for support                     Cognition Arousal/Alertness: Awake/alert Behavior During Therapy: WFL for tasks assessed/performed Overall Cognitive Status: Within Functional Limits for tasks assessed                      Exercises      General Comments General comments (skin integrity, edema, etc.): Per pt she ambulated around the whole floor earlier today and is tired this afternoon      Pertinent Vitals/Pain Pain Assessment: No/denies pain    Home Living                      Prior Function            PT Goals (current goals can now be found in the care plan section) Acute Rehab PT Goals Patient Stated Goal: Breathe better PT Goal Formulation: With patient Time For Goal Achievement: 09/23/15 Potential to Achieve Goals: Good Progress towards PT goals: Progressing toward goals    Frequency  Min 3X/week    PT Plan Current plan remains appropriate    Co-evaluation  End of Session Equipment Utilized During Treatment: Oxygen Activity Tolerance: Patient limited by fatigue Patient left: in chair;with call bell/phone within reach     Time: 1442-1457 PT Time Calculation (min) (ACUTE ONLY): 15 min  Charges:  $Gait Training: 8-22 mins                    G Codes:      Encarnacion Chu PT, DPT  Pager: [phone] Phone: [phone] 09/11/2015, 3:37 PM

## 2015-09-11 NOTE — Progress Notes (Signed)
[place] TEAM 1 - Stepdown/ICU TEAM PROGRESS NOTE  [name] ZHY:[id number] DOB: [birth year] DOA: 09/08/2015 PCP: [name], MD  Admit HPI / Brief Narrative: 81 y.o. female with Hx of COPD on 3 Lpm supplemental oxygen at home, coronary artery disease, and hypertension who presented to the ED via EMS with acute onset of dyspnea at rest. Patient woke with some slight malaise. Over the course of the day she had exertional dyspnea that was slightly more pronounced than her baseline, but it was not until the evening when she experienced acute worsening with dyspnea at rest. She denied any preceding fevers, chills, rhinorrhea, or sore throat. She activated EMS for transport to the emergency department. Nebulized breathing treatment and 125 mg IV Solu-Medrol were administered en route.  In ED, patient was found to to be afebrile, saturating in the low 80s on 3 L/m supplemental oxygen. She was tachycardic in the low 100s and tachypneic on presentation. Her blood pressure was normal. Chest x-ray was negative for acute cardiopulmonary disease and initial blood work featured a leukocytosis to 17,000. Sinus tachycardia and minimal depression of the ST segment in inferior noted on EKG. Troponin was borderline elevated at 0.04. ABG notable for a pH of 7.26 and pCO2 of 79. Patient was given a continuous nebulized breathing treatment with only minimal improvement. 2 g of magnesium were administered. Levaquin was given empirically. She was placed on BiPAP in the emergency department.  HPI/Subjective: The patient states she feels much better today.  She denies current sob, n/v, cp, or abdom pain.    Assessment/Plan:  Acute on chronic respiratory failure with hypoxia and hypercarbia due to COPD exacerbation - On 3 Lpm supplemental O2 at home - presently sating in upper 90s on .5L at rest - no wheezing - cont current regimen - if stable overnight will plan for d/c home in AM   Mildly elevated Troponin w/  Hx of CAD  Trop peaked at 0.61 - no acute findings on EKG - no chest pain - no further w/u planned at this time   Moderate malnutrition in context of chronic illness  Code Status: FULL Family Communication:  No family present at time of exam today Disposition Plan: SDU - probable d/c home in AM   Consultants: none  Procedures: none  Antibiotics: Levaquin 2/28 >  DVT prophylaxis: lovenox   Objective: Blood pressure 153/71, pulse 92, temperature 97.9 F (36.6 C), temperature source Oral, resp. rate 18, height  (1.549 m), weight 60.8 kg (134 lb 0.6 oz), SpO2 95 %.  Intake/Output Summary (Last 24 hours) at 09/11/15 1435 Last data filed at 09/11/15 0900  Gross per 24 hour  Intake    360 ml  Output    100 ml  Net    260 ml   Exam: General: No acute respiratory distress - alert  Lungs: Clear to auscultation bilaterally without wheezes or crackles - distant bs th/o  Cardiovascular: Regular rate and rhythm without murmur gallop or rub  Abdomen: Nontender, nondistended, soft, bowel sounds positive, no rebound Extremities: No significant cyanosis, clubbing, edema bilateral lower extremities   Data Reviewed: Basic Metabolic Panel:  Recent Labs Lab 09/08/15 2220 09/10/15 0755  NA 139 135  K 4.3 4.4  CL 97* 98*  CO2 27 26  GLUCOSE 179* 129*  BUN 14 18  CREATININE 0.85 0.75  CALCIUM 9.6 9.0    CBC:  Recent Labs Lab 09/08/15 2220 09/10/15 0755  WBC 17.2* 15.3*  NEUTROABS 12.6*  --  HGB 12.2 11.3*  HCT 37.8 33.4*  MCV 94.3 93.3  PLT 353 291    Liver Function Tests:  Recent Labs Lab 09/08/15 2220 09/10/15 0755  AST 22 26  ALT 20 21  ALKPHOS 46 36*  BILITOT 0.2* 0.4  PROT 6.9 5.7*  ALBUMIN 4.1 3.6    Coags:  Recent Labs Lab 09/09/15 0255  INR 0.91   Cardiac Enzymes:  Recent Labs Lab 09/09/15 0731 09/09/15 1457 09/09/15 1915 09/10/15 0020 09/10/15 0755  TROPONINI 0.40* 0.46* 0.54* 0.61* 0.40*    CBG:  Recent Labs Lab  09/09/15 0844 09/11/15 0759  GLUCAP 139* 110*    Recent Results (from the past 240 hour(s))  MRSA PCR Screening     Status: None   Collection Time: 09/09/15 12:53 PM  Result Value Ref Range Status   MRSA by PCR NEGATIVE NEGATIVE Final    Comment:        The GeneXpert MRSA Assay (FDA approved for NASAL specimens only), is one component of a comprehensive MRSA colonization surveillance program. It is not intended to diagnose MRSA infection nor to guide or monitor treatment for MRSA infections.      Studies:   Recent x-ray studies have been reviewed in detail by the Attending Physician  Scheduled Meds:  Scheduled Meds: . aspirin  324 mg Oral Once  . aspirin EC  81 mg Oral Daily  . atorvastatin  10 mg Oral Daily  . enoxaparin (LOVENOX) injection  40 mg Subcutaneous Q24H  . feeding supplement (ENSURE ENLIVE)  237 mL Oral TID BM  . levofloxacin  750 mg Oral Q24H  . methylPREDNISolone (SOLU-MEDROL) injection  60 mg Intravenous Q24H  . mometasone-formoterol  2 puff Inhalation BID  . multivitamin with minerals  1 tablet Oral Daily  . nebivolol  5 mg Oral Daily  . niacin  1,000 mg Oral Daily  . sodium chloride flush  3 mL Intravenous Q12H  . tiotropium  18 mcg Inhalation Daily    Time spent on care of this patient: 35mins   Lonia BloodMCCLUNG,Kyian Obst T , MD   [facility] Office  [phone] Pager - Text Page per Loretha StaplerAmion as per below:  On-Call/Text Page:      [email or web address]      password [email or web address]  If 7PM-7AM, please contact night-coverage [email or web address] Password [email or web address] 09/11/2015, 2:35 PM   LOS: 2 days

## 2015-09-12 ENCOUNTER — Encounter (HOSPITAL_COMMUNITY): Payer: Self-pay

## 2015-09-12 LAB — CBC
HEMATOCRIT: 34.3 % — AB (ref 36.0–46.0)
Hemoglobin: 11.3 g/dL — ABNORMAL LOW (ref 12.0–15.0)
MCH: 30.1 pg (ref 26.0–34.0)
MCHC: 32.9 g/dL (ref 30.0–36.0)
MCV: 91.5 fL (ref 78.0–100.0)
Platelets: 308 10*3/uL (ref 150–400)
RBC: 3.75 MIL/uL — AB (ref 3.87–5.11)
RDW: 12.2 % (ref 11.5–15.5)
WBC: 6.4 10*3/uL (ref 4.0–10.5)

## 2015-09-12 LAB — GLUCOSE, CAPILLARY: Glucose-Capillary: 117 mg/dL — ABNORMAL HIGH (ref 65–99)

## 2015-09-12 MED ORDER — FUROSEMIDE 10 MG/ML IJ SOLN
40.0000 mg | Freq: Once | INTRAMUSCULAR | Status: AC
  Administered 2015-09-12: 40 mg via INTRAVENOUS
  Filled 2015-09-12: qty 4

## 2015-09-12 MED ORDER — TIOTROPIUM BROMIDE MONOHYDRATE 2.5 MCG/ACT IN AERS: 2.0000 | INHALATION_SPRAY | Freq: Every day | RESPIRATORY_TRACT | Status: DC

## 2015-09-12 MED ORDER — ZOLPIDEM TARTRATE 5 MG PO TABS
5.0000 mg | ORAL_TABLET | Freq: Every evening | ORAL | Status: DC | PRN
  Administered 2015-09-12 – 2015-09-14 (×2): 5 mg via ORAL
  Filled 2015-09-12 (×3): qty 1

## 2015-09-12 MED ORDER — LEVOFLOXACIN 750 MG PO TABS
750.0000 mg | ORAL_TABLET | ORAL | Status: AC
  Administered 2015-09-13: 750 mg via ORAL
  Filled 2015-09-12: qty 1

## 2015-09-12 MED ORDER — MOMETASONE FURO-FORMOTEROL FUM 200-5 MCG/ACT IN AERO
2.0000 | INHALATION_SPRAY | Freq: Two times a day (BID) | RESPIRATORY_TRACT | Status: DC
  Administered 2015-09-12 – 2015-09-15 (×6): 2 via RESPIRATORY_TRACT
  Filled 2015-09-12: qty 8.8

## 2015-09-12 MED ORDER — PREDNISONE 10 MG PO TABS
10.0000 mg | ORAL_TABLET | Freq: Every day | ORAL | Status: DC
  Administered 2015-09-13 – 2015-09-14 (×2): 10 mg via ORAL
  Filled 2015-09-12 (×2): qty 1

## 2015-09-12 MED ORDER — NEBIVOLOL HCL 5 MG PO TABS
5.0000 mg | ORAL_TABLET | Freq: Every day | ORAL | Status: DC
  Administered 2015-09-13 – 2015-09-15 (×3): 5 mg via ORAL
  Filled 2015-09-12 (×3): qty 1

## 2015-09-12 MED ORDER — CLONAZEPAM 0.5 MG PO TABS
0.2500 mg | ORAL_TABLET | Freq: Two times a day (BID) | ORAL | Status: DC
  Administered 2015-09-12 – 2015-09-15 (×7): 0.25 mg via ORAL
  Filled 2015-09-12 (×7): qty 1

## 2015-09-12 MED ORDER — NEBIVOLOL HCL 10 MG PO TABS: 10.0000 mg | ORAL_TABLET | Freq: Every day | ORAL | Status: DC

## 2015-09-12 NOTE — Progress Notes (Signed)
Pharmacy Antibiotic Note  [name] is a 81 y.o. female admitted on 09/08/2015 with shortness of breath.  Pharmacy has been consulted for Levaquin dosing. Patient's currently on day #4 of antibiotic.  - afeb, wnc down wnl, scr 0.75 (crcl~44-48) - still has significant SOB  3/01 Levaquin >>  3/1 MRSA pcr: neg No cultures   Plan: - change Levaquin to 750 mg IV q48h for renal function   Temp (24hrs), Avg:97.9 F (36.6 C), Min:97.6 F (36.4 C), Max:98.5 F (36.9 C)   Recent Labs Lab 09/08/15 2220 09/10/15 0755 09/12/15 0252  WBC 17.2* 15.3* 6.4  CREATININE 0.85 0.75  --     Estimated Creatinine Clearance: 48.5 mL/min (by C-G formula based on Cr of 0.75).    Allergies  Allergen Reactions  . Penicillins Hives    Has taken it since.  . Pneumococcal Vaccine Swelling    Severe localized swelling in arm  . Pneumovax [Pneumococcal Polysaccharide Vaccine] Hives    [name], [name] P 09/12/2015 9:58 AM

## 2015-09-12 NOTE — Progress Notes (Signed)
[place] TEAM 1 - Stepdown/ICU TEAM PROGRESS NOTE  [name]:[id number] DOB: 01/01/1935 DOA: 09/08/2015 PCP: [name], MD  Admit HPI / Brief Narrative: 81 y.o. female with Hx of COPD on 3 Lpm supplemental oxygen at home, coronary artery disease, and hypertension who presented to the ED via EMS with acute onset of dyspnea at rest. Patient woke with some slight malaise. Over the course of the day she had exertional dyspnea that was slightly more pronounced than her baseline, but it was not until the evening when she experienced acute worsening with dyspnea at rest. She denied any preceding fevers, chills, rhinorrhea, or sore throat. She activated EMS for transport to the emergency department. Nebulized breathing treatment and 125 mg IV Solu-Medrol were administered en route.  In ED, patient was found to to be afebrile, saturating in the low 80s on 3 L/m supplemental oxygen. She was tachycardic in the low 100s and tachypneic on presentation. Her blood pressure was normal. Chest x-ray was negative for acute cardiopulmonary disease and initial blood work featured a leukocytosis to 17,000. Sinus tachycardia and minimal depression of the ST segment in inferior noted on EKG. Troponin was borderline elevated at 0.04. ABG notable for a pH of 7.26 and pCO2 of 79. Patient was given a continuous nebulized breathing treatment with only minimal improvement. 2 g of magnesium were administered. Levaquin was given empirically. She was placed on BiPAP in the emergency department.  HPI/Subjective: The pt has just gotten changed w/ help from her RN.  She is quite anxious at present, and c/o severe SOB.  She is displaying pursed lip breathing, and mild exp wheezing.  She tells me she becomes very sob w/ even slight exertion.  She denies cp, n/v, or abdom pain.    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 rate 23, height  (1.549 m), weight 60.8 kg (134 lb 0.6 oz), SpO2 98 %.  Intake/Output Summary (Last 24 hours) at 09/12/15 0935 Last data filed at 09/12/15 8119  Gross per 24 hour  Intake    360 ml  Output    950 ml  Net   -590 ml   Exam: General: modest resp distress w/ pursed lip breathing  Lungs: tight - poor air movement th/o - mild exp wheeze - no crackles   Cardiovascular: Regular rate and rhythm without murmur  Abdomen:  Nontender, nondistended, soft, bowel sounds positive, no rebound Extremities: No significant cyanosis, or clubbing;  Trace edema bilateral lower extremities   Data Reviewed: Basic Metabolic Panel:  Recent Labs Lab 09/08/15 2220 09/10/15 0755  NA 139 135  K 4.3 4.4  CL 97* 98*  CO2 27 26  GLUCOSE 179* 129*  BUN 14 18  CREATININE 0.85 0.75  CALCIUM 9.6 9.0    CBC:  Recent Labs Lab 09/08/15 2220 09/10/15 0755 09/12/15 0252  WBC 17.2* 15.3* 6.4  NEUTROABS 12.6*  --   --   HGB 12.2 11.3* 11.3*  HCT 37.8 33.4* 34.3*   MCV 94.3 93.3 91.5  PLT 353 291 308    Liver Function Tests:  Recent Labs Lab 09/08/15 2220 09/10/15 0755  AST 22 26  ALT 20 21  ALKPHOS 46 36*  BILITOT 0.2* 0.4  PROT 6.9 5.7*  ALBUMIN 4.1 3.6    Coags:  Recent Labs Lab 09/09/15 0255  INR 0.91   Cardiac Enzymes:  Recent Labs Lab 09/09/15 0731 09/09/15 1457 09/09/15 1915 09/10/15 0020 09/10/15 0755  TROPONINI 0.40* 0.46* 0.54* 0.61* 0.40*    CBG:  Recent Labs Lab 09/09/15 0844 09/11/15 0759 09/12/15 0821  GLUCAP 139* 110* 117*    Recent Results (from the past 240 hour(s))  MRSA PCR Screening     Status: None   Collection Time: 09/09/15 12:53 PM  Result Value Ref Range Status   MRSA by PCR NEGATIVE NEGATIVE Final    Comment:        The GeneXpert MRSA Assay (FDA approved for NASAL specimens only), is one component of a comprehensive MRSA colonization surveillance program. It is not intended to diagnose MRSA infection nor to guide or monitor treatment for MRSA infections.      Studies:   Recent x-ray studies have been reviewed in detail by the Attending Physician  Scheduled Meds:  Scheduled Meds: . aspirin EC  81 mg Oral Daily  . atorvastatin  10 mg Oral Daily  . enoxaparin (LOVENOX) injection  40 mg Subcutaneous Q24H  . feeding supplement (ENSURE ENLIVE)  237 mL Oral TID BM  . levofloxacin  750 mg Oral Q24H  . mometasone-formoterol  2 puff Inhalation BID  . multivitamin with minerals  1 tablet Oral Daily  . nebivolol  5 mg Oral Daily  . niacin  1,000 mg Oral Daily  . predniSONE  30 mg Oral BID WC  . sodium chloride flush  3 mL Intravenous Q12H  . tiotropium  18 mcg Inhalation Daily    Time spent on care of this patient: 35mins   Lonia BloodMCCLUNG,Cobi Delph T , MD   [facility] Office  [phone] Pager - Text Page per Loretha StaplerAmion as per below:  On-Call/Text Page:      [email or web address]      password [email or web address]  If 7PM-7AM, please contact night-coverage [email or web address] Password [email or web address] 09/12/2015,  9:35 AM   LOS: 3 days

## 2015-09-13 LAB — COMPREHENSIVE METABOLIC PANEL
ALBUMIN: 3.4 g/dL — AB (ref 3.5–5.0)
ALT: 26 U/L (ref 14–54)
ANION GAP: 11 (ref 5–15)
AST: 18 U/L (ref 15–41)
Alkaline Phosphatase: 34 U/L — ABNORMAL LOW (ref 38–126)
BILIRUBIN TOTAL: 0.3 mg/dL (ref 0.3–1.2)
BUN: 22 mg/dL — ABNORMAL HIGH (ref 6–20)
CO2: 30 mmol/L (ref 22–32)
Calcium: 9.6 mg/dL (ref 8.9–10.3)
Chloride: 96 mmol/L — ABNORMAL LOW (ref 101–111)
Creatinine, Ser: 0.62 mg/dL (ref 0.44–1.00)
GFR calc non Af Amer: >60 mL/min (ref >60)
GLUCOSE: 139 mg/dL — AB (ref 65–99)
POTASSIUM: 4 mmol/L (ref 3.5–5.1)
SODIUM: 137 mmol/L (ref 135–145)
TOTAL PROTEIN: 5.7 g/dL — AB (ref 6.5–8.1)

## 2015-09-13 LAB — TSH: TSH: 0.603 u[IU]/mL (ref 0.350–4.500)

## 2015-09-13 LAB — GLUCOSE, CAPILLARY: GLUCOSE-CAPILLARY: 177 mg/dL — AB (ref 65–99)

## 2015-09-13 MED ORDER — FUROSEMIDE 10 MG/ML IJ SOLN
40.0000 mg | Freq: Once | INTRAMUSCULAR | Status: AC
  Administered 2015-09-13: 40 mg via INTRAVENOUS
  Filled 2015-09-13: qty 4

## 2015-09-13 NOTE — Progress Notes (Signed)
[place] TEAM 1 - Stepdown/ICU TEAM PROGRESS NOTE  Sheri SleeperHazel H Lanum JYN:[id number] DOB: 12-10-1934 DOA: 09/08/2015 PCP: Alysia PennaHOLWERDA, SCOTT, MD  Admit HPI / Brief Narrative: 81 y.o. female with Hx of COPD on 3 Lpm supplemental oxygen at home, coronary artery disease, and hypertension who presented to the ED via EMS with acute onset of dyspnea at rest. Patient woke with some slight malaise. Over the course of the day she had exertional dyspnea that was slightly more pronounced than her baseline, but it was not until the evening when she experienced acute worsening with dyspnea at rest. She denied any preceding fevers, chills, rhinorrhea, or sore throat. She activated EMS for transport to the emergency department. Nebulized breathing treatment and 125 mg IV Solu-Medrol were administered en route.  In ED, patient was found to to be afebrile, saturating in the low 80s on 3 L/m supplemental oxygen. She was tachycardic in the low 100s and tachypneic on presentation. Her blood pressure was normal. Chest x-ray was negative for acute cardiopulmonary disease and initial blood work featured a leukocytosis to 17,000. Sinus tachycardia and minimal depression of the ST segment in inferior noted on EKG. Troponin was borderline elevated at 0.04. ABG notable for a pH of 7.26 and pCO2 of 79. Patient was given a continuous nebulized breathing treatment with only minimal improvement. 2 g of magnesium were administered. Levaquin was given empirically. She was placed on BiPAP in the emergency department.  HPI/Subjective: The patient does quite well when she is resting.  With the slightest bit of exertion (even just standing from bed to sit in bedside chair) she rapidly become severely short of breath displaying significant increased work of breathing and pursed lipped breathing.  She denies chest pain fevers chills nausea or vomiting.  She states that she is nowhere near this short of breath with exertion at her  baseline.  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 rate 26, height 5\' 1"  (1.549 m), weight 60.8 kg (134 lb 0.6 oz), SpO2 99 %.  Intake/Output Summary (Last 24 hours) at 09/13/15 1408 Last data filed at 09/13/15 1200  Gross per 24 hour  Intake    603 ml  Output    450 ml  Net  153 ml   Exam: General: modest resp distress w/ pursed lip breathing in bedside chair  Lungs: poor air movement th/o but no wheeze at this time - no crackles   Cardiovascular: Regular rate and rhythm without murmur - distant HS  Abdomen: Nontender, nondistended, soft, bowel sounds positive, no rebound Extremities: No significant cyanosis, clubbing, or edema bilateral lower extremities   Data Reviewed: Basic Metabolic Panel:  Recent Labs Lab 09/08/15 2220 09/10/15 0755  09/13/15 0221  NA 139 135 137  K 4.3 4.4 4.0  CL 97* 98* 96*  CO2 GLUCOSE 179* 129* 139*  BUN 14 18 22*  CREATININE 0.85 0.75 0.62  CALCIUM 9.6 9.0 9.6    CBC:  Recent Labs Lab 09/08/15 2220 09/10/15 0755 09/12/15 0252  WBC 17.2* 15.3* 6.4  NEUTROABS 12.6*  --   --   HGB 12.2 11.3* 11.3*  HCT 37.8 33.4* 34.3*  MCV 94.3 93.3 91.5  PLT 353 291 308    Liver Function Tests:  Recent Labs Lab 09/08/15 2220 09/10/15 0755 09/13/15 0221  AST ALT ALKPHOS 46 36* 34*  BILITOT 0.2* 0.4 0.3  PROT 6.9 5.7* 5.7*  ALBUMIN 4.1 3.6 3.4*    Coags:  Recent Labs Lab 09/09/15 0255  INR 0.91   Cardiac Enzymes:  Recent Labs Lab 09/09/15 0731 09/09/15 1457 09/09/15 1915 09/10/15 0020 09/10/15 0755  TROPONINI 0.40* 0.46* 0.54* 0.61* 0.40*    CBG:  Recent Labs Lab 09/09/15 0844 09/11/15 0759 09/12/15 0821  GLUCAP 139* 110* 117*    Recent Results (from the past 240 hour(s))  MRSA PCR Screening     Status: None   Collection Time: 09/09/15 12:53 PM  Result Value Ref Range Status   MRSA by PCR NEGATIVE NEGATIVE Final    Comment:        The GeneXpert MRSA Assay (FDA approved for NASAL specimens only), is one component of a comprehensive MRSA colonization surveillance program. It is not intended to diagnose MRSA infection nor to guide or monitor treatment for MRSA infections.      Studies:   Recent x-ray studies have been reviewed in detail by the Attending Physician  Scheduled Meds:  Scheduled Meds: . aspirin EC  81 mg Oral Daily  . atorvastatin  10 mg Oral Daily  . clonazePAM  0.25 mg Oral BID  . enoxaparin (LOVENOX) injection  40 mg Subcutaneous Q24H  . feeding supplement (ENSURE ENLIVE)  237 mL Oral TID BM  . levofloxacin  750 mg Oral Q48H  . mometasone-formoterol  2 puff Inhalation BID  . multivitamin with minerals  1 tablet Oral Daily  . nebivolol  5 mg Oral Daily  . niacin  1,000 mg Oral Daily  . predniSONE   10 mg Oral Q breakfast  . sodium chloride flush  3 mL Intravenous Q12H  . tiotropium  18 mcg Inhalation Daily    Time spent on care of this patient:   Lonia Blood , MD   [facility] Office  [phone] Pager - Text Page per Loretha Stapler as per below:  On-Call/Text Page:      [email or web address]      password [email or web address]  If 7PM-7AM, please contact night-coverage [email or web address] Password [email or web address] 09/13/2015, 2:08 PM   LOS: 4 days

## 2015-09-14 ENCOUNTER — Inpatient Hospital Stay (HOSPITAL_COMMUNITY): Payer: PPO

## 2015-09-14 DIAGNOSIS — I2510 Atherosclerotic heart disease of native coronary artery without angina pectoris: Secondary | ICD-10-CM

## 2015-09-14 DIAGNOSIS — J441 Chronic obstructive pulmonary disease with (acute) exacerbation: Principal | ICD-10-CM

## 2015-09-14 DIAGNOSIS — R7989 Other specified abnormal findings of blood chemistry: Secondary | ICD-10-CM

## 2015-09-14 DIAGNOSIS — J9622 Acute and chronic respiratory failure with hypercapnia: Secondary | ICD-10-CM

## 2015-09-14 DIAGNOSIS — J9621 Acute and chronic respiratory failure with hypoxia: Secondary | ICD-10-CM

## 2015-09-14 DIAGNOSIS — E440 Moderate protein-calorie malnutrition: Secondary | ICD-10-CM | POA: Insufficient documentation

## 2015-09-14 LAB — GLUCOSE, CAPILLARY: Glucose-Capillary: 135 mg/dL — ABNORMAL HIGH (ref 65–99)

## 2015-09-14 MED ORDER — FUROSEMIDE 10 MG/ML IJ SOLN
40.0000 mg | Freq: Two times a day (BID) | INTRAMUSCULAR | Status: DC
  Administered 2015-09-14 – 2015-09-15 (×2): 40 mg via INTRAVENOUS
  Filled 2015-09-14 (×2): qty 4

## 2015-09-14 NOTE — Care Management Note (Signed)
Case Management Note  Patient Details  Name: [name] MRN: [id number] Date of Birth: 1934/11/27  Subjective/Objective:  Pt lives with spouse who will provide 24/7 assistance.  Home health RN and PT services were arranged by previous CM.  Pt and spouse understand there will be a $25 copay each visit.  PT recommends rolling and spouse requests copay amount for equipment.  [facility].00 and spouse indicates he would like rollator ordered.                            Expected Discharge Plan:  Home w Home Health Services  Discharge planning Services  CM Consult  Post Acute Care Choice:  Home Health  DME Arranged:  Walker rolling with seat DME Agency:  [facility]  [facility]  [facility]  Status of Service:  Completed, signed off  Medicare Important Message Given:  Yes  Magdalene RiverMayo, Makhia Vosler T, RN 09/14/2015, 3:07 PM

## 2015-09-14 NOTE — Progress Notes (Signed)
Physical Therapy Treatment Patient Details Name: FRITZI SCRIPTER MRN: [id number] DOB: 10/16/1934 Today's Date: 09/14/2015    History of Present Illness pt presents with SOB and COPD Exacerbation.  pt with hx of COPD, Emphysema, HTN, and CAD.      PT Comments    Per RN patient had a panic attack last night however pt was calm and did very well today. Pt able to tolerate amb 50'x2 with RW and seated rest break. Spouse assist pt a lot at home with ADLs. Pt SpO2 >95% on .5LO2.  Con't to recommend HHPT upon d/c and 24/7 assist.   Follow Up Recommendations  Home health PT;Supervision/Assistance - 24 hour     Equipment Recommendations       Recommendations for Other Services       Precautions / Restrictions Precautions Precautions: Fall Restrictions Weight Bearing Restrictions: No    Mobility  Bed Mobility Overal bed mobility: Needs Assistance Bed Mobility: Supine to Sit     Supine to sit: Supervision Sit to supine: Supervision;HOB elevated   General bed mobility comments: increased time, pt used bed rail  Transfers Overall transfer level: Needs assistance Equipment used: Rolling walker (2 wheeled) Transfers: Sit to/from Stand Sit to Stand: Min guard         General transfer comment: no physical assist, increased time, v/c's to push up from chair  Ambulation/Gait Ambulation/Gait assistance: Min guard Ambulation Distance (Feet): 50 Feet (x2, with sitting rest break x 3 min) Assistive device: Rolling walker (2 wheeled) Gait Pattern/deviations: Step-through pattern;Decreased stride length Gait velocity: decresaed Gait velocity interpretation: Below normal speed for age/gender General Gait Details: no episodes of LOB, slow and steady, demo'd purse lipped breathing, SpO2 at >95%, HR 109-117   Stairs            Wheelchair Mobility    Modified Rankin (Stroke Patients Only)       Balance                                    Cognition  Arousal/Alertness: Awake/alert Behavior During Therapy: WFL for tasks assessed/performed Overall Cognitive Status: Within Functional Limits for tasks assessed                      Exercises      General Comments General comments (skin integrity, edema, etc.): encouraged pt to amb with RN staff and stay up in chair/out of bed      Pertinent Vitals/Pain Pain Assessment: No/denies pain    Home Living                      Prior Function            PT Goals (current goals can now be found in the care plan section) Acute Rehab PT Goals Patient Stated Goal: Breathe better Progress towards PT goals: Progressing toward goals    Frequency  Min 3X/week    PT Plan Current plan remains appropriate    Co-evaluation             End of Session Equipment Utilized During Treatment: Gait belt;Oxygen (.5Lo2 VIA [place]) Activity Tolerance: Patient limited by fatigue Patient left: in chair;with call bell/phone within reach;with family/visitor present     Time: 1030-1059 PT Time Calculation (min) (ACUTE ONLY): 29 min  Charges:  $Gait Training: 23-37 mins  G CodesMarcene Brawn:      Elzie Knisley Marie 09/14/2015, 11:15 AM   Lewis ShockAshly Sedric Guia, PT, DPT Pager #: [phone] Office #: [phone]

## 2015-09-14 NOTE — Progress Notes (Signed)
[place] TEAM 1 - Stepdown/ICU TEAM PROGRESS NOTE  [name]:[id number] DOB: [birth year] DOA: 09/08/2015 PCP: [name], MD  Admit HPI / Brief Narrative: 81 y.o. female with Hx of COPD on 3 Lpm supplemental oxygen at home, coronary artery disease, and hypertension who presented to the ED via EMS with acute onset of dyspnea at rest. Patient woke with some slight malaise. Over the course of the day she had exertional dyspnea that was slightly more pronounced than her baseline, but it was not until the evening when she experienced acute worsening with dyspnea at rest. She denied any preceding fevers, chills, rhinorrhea, or sore throat. She activated EMS for transport to the emergency department. Nebulized breathing treatment and 125 mg IV Solu-Medrol were administered en route.  In ED, patient was found to to be afebrile, saturating in the low 80s on 3 L/m supplemental oxygen. She was tachycardic in the low 100s and tachypneic on presentation. Her blood pressure was normal. Chest x-ray was negative for acute cardiopulmonary disease and initial blood work featured a leukocytosis to 17,000. Sinus tachycardia and minimal depression of the ST segment in inferior noted on EKG. Troponin was borderline elevated at 0.04. ABG notable for a pH of 7.26 and pCO2 of 79. Patient was given a continuous nebulized breathing treatment with only minimal improvement. 2 g of magnesium were administered. Levaquin was given empirically. She was placed on BiPAP in the emergency department.  HPI/Subjective: The patient reports no significant change today.  She continues to be extremely dyspneic with the slightest bit of exertion.  She is much more comfortable at rest however and not in acute distress when resting.  She denies chest pain nausea vomiting or abdominal pain.  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 rate 23, height  (1.549 m), weight 57.5 kg (126 lb 12.2 oz), SpO2 97 %.  Intake/Output Summary (Last 24 hours) at 09/14/15 1319 Last data filed at 09/14/15 0800  Gross per 24 hour  Intake    483 ml  Output    650 ml  Net   -167 ml   Exam: General: no acute distress sitting in bed  Lungs: poor air movement th/o - no wheeze Cardiovascular: Regular rate and  rhythm without murmur - distant HS  Abdomen: Nontender, nondistended, soft, bowel sounds positive, no rebound, no mass Extremities: No significant cyanosis, clubbing, edema bilateral lower extremities   Data Reviewed: Basic Metabolic Panel:  Recent Labs Lab 09/08/15 2220 09/10/15 0755 09/13/15 0221  NA 139 135 137  K 4.3 4.4 4.0  CL 97* 98* 96*  CO2 27 26 30   GLUCOSE 179* 129* 139*  BUN 14 18 22*  CREATININE 0.85 0.75 0.62  CALCIUM 9.6 9.0 9.6    CBC:  Recent Labs Lab  09/08/15 2220 09/10/15 0755 09/12/15 0252  WBC 17.2* 15.3* 6.4  NEUTROABS 12.6*  --   --   HGB 12.2 11.3* 11.3*  HCT 37.8 33.4* 34.3*  MCV 94.3 93.3 91.5  PLT 353 291 308    Liver Function Tests:  Recent Labs Lab 09/08/15 2220 09/10/15 0755 09/13/15 0221  AST 22 26 18   ALT 20 21 26   ALKPHOS 46 36* 34*  BILITOT 0.2* 0.4 0.3  PROT 6.9 5.7* 5.7*  ALBUMIN 4.1 3.6 3.4*    Coags:  Recent Labs Lab 09/09/15 0255  INR 0.91   Cardiac Enzymes:  Recent Labs Lab 09/09/15 0731 09/09/15 1457 09/09/15 1915 09/10/15 0020 09/10/15 0755  TROPONINI 0.40* 0.46* 0.54* 0.61* 0.40*    CBG:  Recent Labs Lab 09/09/15 0844 09/11/15 0759 09/12/15 0821 09/13/15 1939 09/14/15 0828  GLUCAP 139* 110* 117* 177* 135*    Recent Results (from the past 240 hour(s))  MRSA PCR Screening     Status: None   Collection Time: 09/09/15 12:53 PM  Result Value Ref Range Status   MRSA by PCR NEGATIVE NEGATIVE Final    Comment:        The GeneXpert MRSA Assay (FDA approved for NASAL specimens only), is one component of a comprehensive MRSA colonization surveillance program. It is not intended to diagnose MRSA infection nor to guide or monitor treatment for MRSA infections.      Studies:   Recent x-ray studies have been reviewed in detail by the Attending Physician  Scheduled Meds:  Scheduled Meds: . aspirin EC  81 mg Oral Daily  . atorvastatin  10 mg Oral Daily  . clonazePAM  0.25 mg Oral BID  . enoxaparin (LOVENOX) injection  40 mg Subcutaneous Q24H  . feeding supplement (ENSURE ENLIVE)  237 mL Oral TID BM  . furosemide  40 mg Intravenous BID  . mometasone-formoterol  2 puff Inhalation BID  . multivitamin with minerals  1 tablet Oral Daily  . nebivolol  5 mg Oral Daily  . niacin  1,000 mg Oral Daily  . predniSONE  10 mg Oral Q breakfast  . sodium chloride flush  3 mL Intravenous Q12H  . tiotropium  18 mcg Inhalation Daily    Time spent on care of this patient:  25mins   Lonia BloodMCCLUNG,Corliss Lamartina T , MD   [facility] Office  [phone] Pager - Text Page per Loretha StaplerAmion as per below:  On-Call/Text Page:      [email or web address]      password [email or web address]  If 7PM-7AM, please contact night-coverage [email or web address] Password [email or web address] 09/14/2015, 1:19 PM   LOS: 5 days

## 2015-09-14 NOTE — Consult Note (Signed)
Name: [name] MRN: [id number] DOB: Dec 15, 1934    ADMISSION DATE:  09/08/2015 CONSULTATION DATE:  09/14/15  REFERRING MD : Sharon Seller  CHIEF COMPLAINT:   Shortness of breath  BRIEF PATIENT DESCRIPTION:   [name] is a 81 yo female with Hx of COPD,on 3 Lpm supplemental oxygen at home, coronary artery disease, and hypertension who presented to the ED via EMS with acute onset of dyspnea at rest. In ED, patient was found to to be afebrile, saturating in the low 80s on 3 L/m supplemental oxygen. She was tachycardic in the low 100s and tachypneic on presentation. Her blood pressure was normal. Chest x-ray was negative for acute cardiopulmonary disease and initial blood work featured a leukocytosis to 17,000. Sinus tachycardia and minimal depression of the ST segment in inferior noted on EKG. Troponin was borderline elevated at 0.04. ABG notable for a pH of 7.26 and pCO2 of 79. Patient was given a continuous nebulized breathing treatment with only minimal improvement. 2 g of magnesium were administered. Levaquin was given empirically. She was placed on BiPAP in the emergency department.   Patient exhibits increased work of breathing with slightest bit of exertion even just by standing from bed in bedside chair. She had increased SOB, pursed lip breathing on 09/13/15 and therefore PCCM team was consulted   SIGNIFICANT EVENTS   09/14/15 > came to ED with Acute onset of Dyspnea  STUDIES:  none  HISTORY OF PRESENT ILLNESS:  [name] is a 81 y.o. female with PMH of COPD on 3 L/m supplemental oxygen at home, coronary artery disease, and hypertension who presents to the ED via EMS with acute onset of dyspnea at rest. Patient reports pain in her usual state of health when going to sleep last night, but woke this morning with some slight malaise. Over the course of the day she had exertional dyspnea that was slightly more pronounced than her baseline, but it was not until this evening that she  experienced acute worsening with dyspnea at rest. She denies any preceding fevers, chills, rhinorrhea, or sore throat. There's been no long distance travel or sick contacts. She has no unilateral lower extremity swelling or tenderness. There is no chest pain or palpitations associated with this illness. She has experienced similar symptoms previously with exacerbations of her COPD. She has used nebulized breathing treatments at home with no appreciable improvement. She activated EMS for transport to the emergency department. Nebulized breathing treatment and 125 mg IV Solu-Medrol were administered en route.  In ED, patient was found to to be afebrile, saturating in the low 80s on 3 L/m supplemental oxygen. She was tachycardic in the low 100s and tachypneic on presentation. Her blood pressure was normal. Chest x-ray was negative for acute cardiopulmonary disease and initial blood work featured a leukocytosis to 17,000. Sinus tachycardia and minimal depression of the ST segment in inferior noted on EKG. Troponin is borderline elevated at a value of 0.04. ABG is obtained and notable for a pH of 7.26 and pCO2 of 79. Patient was given a continuous nebulized breathing treatment with only minimal improvement. 2 g of magnesium were administered. Levaquin was given empirically. She was placed on BiPAP in the emergency department, remained hemodynamically stable, and will be admitted to the stepdown unit for ongoing evaluation and management of acute respiratory failure with hypoxia and hypercarbia suspected secondary to acute exacerbation in COPD.   The patient does quite well when she is resting. With the slightest bit of exertion (  even just standing from bed to sit in bedside chair) she rapidly become severely short of breath displaying significant increased work of breathing and pursed lipped breathing. She denies chest pain fevers chills nausea or vomiting. She states that she is nowhere near this short of breath  with exertion at her baseline. She had similar episode on 3/6 morning. She was resting at the time and appeared to be in respiratory distress and flushed. Denies fever, chills, nausea.  PAST MEDICAL HISTORY :   has a past medical history of PVD (peripheral vascular disease) (HCC); Actinomycosis, cervicofacial; Emphysema; COPD (chronic obstructive pulmonary disease) (HCC); HTN (hypertension); echocardiogram; Hyperlipidemia; and CAD (coronary artery disease).  has past surgical history that includes Appendectomy; Tonsillectomy; and Bone graft hip iliac crest. Prior to Admission medications   Medication Sig Start Date End Date Taking? Authorizing Provider  albuterol (PROVENTIL HFA;VENTOLIN HFA) 108 (90 BASE) MCG/ACT inhaler Inhale 2 puffs into the lungs every 6 (six) hours as needed. For shortness of breath   Yes Historical Provider, MD  albuterol (PROVENTIL) (2.5 MG/3ML) 0.083% nebulizer solution Take 3 mLs (2.5 mg total) by nebulization every 6 (six) hours as needed for wheezing or shortness of breath. 05/14/15  Yes Leslye Peerobert S Byrum, MD  aspirin 81 MG tablet Take 81 mg by mouth daily.     Yes Historical Provider, MD  atorvastatin (LIPITOR) 10 MG tablet Take 10 mg by mouth daily.     Yes Historical Provider, MD  budesonide-formoterol (SYMBICORT) 160-4.5 MCG/ACT inhaler INHALE 2 PUFFS INTO THE LUNGS 2 (TWO) TIMES DAILY. 08/28/15  Yes Leslye Peerobert S Byrum, MD  Multiple Vitamin (MULTIVITAMIN) tablet Take 1 tablet by mouth daily.     Yes Historical Provider, MD  nebivolol (BYSTOLIC) 5 MG tablet Take 1 tablet (5 mg total) by mouth daily. 06/17/15  Yes Lars MassonKatarina H Nelson, MD  niacin (NIASPAN) 1000 MG CR tablet Take 1,000 mg by mouth daily.   Yes Historical Provider, MD  Tiotropium Bromide Monohydrate (SPIRIVA RESPIMAT) 2.5 MCG/ACT AERS INHALE 2 PUFFS INTO THE LUNGS DAILY. 08/28/15  Yes Leslye Peerobert S Byrum, MD  famotidine (PEPCID) 20 MG tablet Take 1 tablet 2 times daily for 4 weeks then as needed Patient not taking: Reported  on 09/08/2015 07/23/12   Beatrice LecherScott T Weaver, PA-C  UNABLE TO FIND Inhale 3 L into the lungs continuous.    Historical Provider, MD   Allergies  Allergen Reactions  . Penicillins Hives    Has taken it since.  . Pneumococcal Vaccine Swelling    Severe localized swelling in arm  . Pneumovax [Pneumococcal Polysaccharide Vaccine] Hives    FAMILY HISTORY:  family history includes Coronary artery disease in her brother; Emphysema in her father and sister; Heart disease in her father and mother; Stomach cancer in her father and sister. SOCIAL HISTORY:  reports that she quit smoking about 10 years ago. Her smoking use included Cigarettes. She has a 49 pack-year smoking history. She has never used smokeless tobacco. She reports that she does not drink alcohol or use illicit drugs.  REVIEW OF SYSTEMS:   Constitutional: Negative for fever, chills, weight loss, malaise/fatigue and diaphoresis.  HENT: Negative for hearing loss, ear pain, nosebleeds, congestion, sore throat, neck pain, tinnitus and ear discharge.   Eyes: Negative for blurred vision, double vision, photophobia, pain, discharge and redness.  Respiratory: Negative for cough, hemoptysis, sputum production, shortness of breath, wheezing and stridor.   Cardiovascular: Negative for chest pain, palpitations, orthopnea, claudication, leg swelling and PND.  Gastrointestinal: Negative for  heartburn, nausea, vomiting, abdominal pain, diarrhea, constipation, blood in stool and melena.  Genitourinary: Negative for dysuria, urgency, frequency, hematuria and flank pain.  Musculoskeletal: Negative for myalgias, back pain, joint pain and falls.  Skin: Negative for itching and rash.  Neurological: Negative for dizziness, tingling, tremors, sensory change, speech change, focal weakness, seizures, loss of consciousness, weakness and headaches.  Endo/Heme/Allergies: Negative for environmental allergies and polydipsia. Does not bruise/bleed easily.  SUBJECTIVE:  Patient was ambulating with PT on 0.5 L of O2 and wassatting >95%.  Patient says she feels much better today and her O2 demands have improved since she has been in the hospital.  She also reports that she was having acute SOB last night.  She felt flushed. No chest pain, fever chills.  VITAL SIGNS: Temp:  [97.5 F (36.4 C)-98.3 F (36.8 C)] 97.7 F (36.5 C) (03/06 0831) Pulse Rate:  [79-97] 97 (03/06 0831) Resp:  [18-28] 20 (03/06 0831) BP: (99-120)/(58-83) 99/58 mmHg (03/06 0831) SpO2:  [94 %-99 %] 97 % (03/06 0831) FiO2 (%):  [22 %] 22 % (03/06 0756) Weight:  [126 lb 1.7 oz (57.2 kg)-126 lb 12.2 oz (57.5 kg)] 126 lb 12.2 oz (57.5 kg) (03/06 0233)  PHYSICAL EXAMINATION: General:  Elderly white female, ambulating with PT, moderate resp distress Neuro:  Awake, alert,Able to make appropriate conversation. HEENT: Atraumatic,normocephalic, white sclera Cardiovascular:S1,S2, No JVD Lungs: diminished throughout, no wheezes, crackles noted Abdomen: soft, non tender, good BS Skin: intact, bruises, no    Recent Labs Lab 09/08/15 2220 09/10/15 0755 09/13/15 0221  NA 139 135 137  K 4.3 4.4 4.0  CL 97* 98* 96*  CO2 BUN 14 18 22*  CREATININE 0.85 0.75 0.62  GLUCOSE 179* 129* 139*   Recent Labs Lab 09/08/15 2220 09/10/15 0755 09/12/15 0252  HGB 12.2 11.3* 11.3*  HCT 37.8 33.4* 34.3*  WBC 17.2* 15.3* 6.4  PLT 353 291 308    ASSESSMENT / PLAN:  A Acute on chronic respiratory failure with hypoxia  due to COPD exacerbation Severe Anxiety 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 Continue to wean steroids. Continue clonipin as per primary Continue to diurese    Bincy Varughese,AG-ACNP Pulmonary & Critical Care Pulmonary and Critical Care Medicine [facility] Pager: [phone]  09/14/2015, 10:53 AM  STAFF NOTE: I, Rory Percy, MD FACP have personally  reviewed patient's available data, including medical history, events of note, physical examination and test results as part of my evaluation. I have discussed with resident/NP and other care providers such as pharmacist, RN and RRT. In addition, I personally evaluated patient and elicited key findings of: moving air well, does have JVD, no sig edema on lower ext, she describes exertional SOB still , I dont feel this is active bronchospasm, her copd is advanced and may be worsening limiting her functional status natural progression, however, with her prior h/o diastolic heart dz , sudden onset sob and her drinking " a ton" of water lately, will re assess echo, no role re increase steroids, continued lasix to futher neg balance, make bid, will revisit in am , updated her and her son  Mcarthur Rossetti. Tyson Alias, MD, FACP Pgr: [phone] [place] [facility] 09/14/2015 12:32 PM

## 2015-09-15 ENCOUNTER — Inpatient Hospital Stay (HOSPITAL_COMMUNITY): Payer: PPO

## 2015-09-15 DIAGNOSIS — E785 Hyperlipidemia, unspecified: Secondary | ICD-10-CM | POA: Insufficient documentation

## 2015-09-15 DIAGNOSIS — I429 Cardiomyopathy, unspecified: Secondary | ICD-10-CM

## 2015-09-15 DIAGNOSIS — F411 Generalized anxiety disorder: Secondary | ICD-10-CM

## 2015-09-15 DIAGNOSIS — I2510 Atherosclerotic heart disease of native coronary artery without angina pectoris: Secondary | ICD-10-CM | POA: Insufficient documentation

## 2015-09-15 DIAGNOSIS — R0600 Dyspnea, unspecified: Secondary | ICD-10-CM

## 2015-09-15 DIAGNOSIS — I422 Other hypertrophic cardiomyopathy: Secondary | ICD-10-CM | POA: Insufficient documentation

## 2015-09-15 LAB — BASIC METABOLIC PANEL
Anion gap: 13 (ref 5–15)
BUN: 22 mg/dL — AB (ref 6–20)
CALCIUM: 9.5 mg/dL (ref 8.9–10.3)
CO2: 31 mmol/L (ref 22–32)
Chloride: 93 mmol/L — ABNORMAL LOW (ref 101–111)
Creatinine, Ser: 0.79 mg/dL (ref 0.44–1.00)
GFR calc Af Amer: >60 mL/min (ref >60)
Glucose, Bld: 121 mg/dL — ABNORMAL HIGH (ref 65–99)
POTASSIUM: 3.4 mmol/L — AB (ref 3.5–5.1)
SODIUM: 137 mmol/L (ref 135–145)

## 2015-09-15 LAB — PHOSPHORUS: PHOSPHORUS: 4.4 mg/dL (ref 2.5–4.6)

## 2015-09-15 LAB — MAGNESIUM: Magnesium: 1.9 mg/dL (ref 1.7–2.4)

## 2015-09-15 MED ORDER — CLONAZEPAM 0.5 MG PO TABS: 0.2500 mg | ORAL_TABLET | Freq: Two times a day (BID) | ORAL | Status: DC

## 2015-09-15 MED ORDER — FUROSEMIDE 40 MG PO TABS: 40.0000 mg | ORAL_TABLET | Freq: Two times a day (BID) | ORAL | Status: DC

## 2015-09-15 MED ORDER — POTASSIUM CHLORIDE CRYS ER 20 MEQ PO TBCR
40.0000 meq | EXTENDED_RELEASE_TABLET | Freq: Two times a day (BID) | ORAL | Status: DC
  Administered 2015-09-15: 40 meq via ORAL
  Filled 2015-09-15: qty 2

## 2015-09-15 MED ORDER — POTASSIUM CHLORIDE CRYS ER 20 MEQ PO TBCR: 40.0000 meq | EXTENDED_RELEASE_TABLET | Freq: Two times a day (BID) | ORAL | Status: DC

## 2015-09-15 NOTE — Progress Notes (Signed)
   Name: Terald SleeperHazel H Perham MRN: [id number] DOB: 1934-11-30    ADMISSION DATE:  09/08/2015 CONSULTATION DATE:  09/14/15  REFERRING MD : Sharon SellerMCCLUNG  CHIEF COMPLAINT:   Shortness of breath  BRIEF PATIENT DESCRIPTION:   [name] is a 81 yo female with Hx of COPD,on 3 Lpm supplemental oxygen at home, coronary artery disease, and hypertension who presented to the ED via EMS with acute onset of dyspnea at rest. In ED, patient was found to to be afebrile, saturating in the low 80s on 3 L/m supplemental oxygen. She was tachycardic in the low 100s and tachypneic on presentation. Her blood pressure was normal. Chest x-ray was negative for acute cardiopulmonary disease and initial blood work featured a leukocytosis to 17,000. Sinus tachycardia and minimal depression of the ST segment in inferior noted on EKG. Troponin was borderline elevated at 0.04. ABG notable for a pH of 7.26 and pCO2 of 79. Patient was given a continuous nebulized breathing treatment with only minimal improvement. 2 g of magnesium were administered. Levaquin was given empirically. She was placed on BiPAP in the emergency department.   Patient exhibits increased work of breathing with slightest bit of exertion even just by standing from bed in bedside chair. She had increased SOB, pursed lip breathing on 09/13/15 and therefore PCCM team was consulted   SUBJECTIVE:  Patient says that she doesn't feel good today.  She rested well last night but woke up" as in a panic attack." She is on 1L O2 and satting >95%.. Patient says comfortable now and talking with family members.   VITAL SIGNS: Temp:  [97.7 F (36.5 C)-98.3 F (36.8 C)] 97.7 F (36.5 C) (03/07 0846) Pulse Rate:  [79-111] 79 (03/07 0846) Resp:  [17-23] 18 (03/07 0846) BP: (101-159)/(52-76) 101/52 mmHg (03/07 0846) SpO2:  [95 %-100 %] 99 % (03/07 0846) Weight:  [125 lb 14.1 oz (57.1 kg)] 125 lb 14.1 oz (57.1 kg) (03/07 0600)  PHYSICAL EXAMINATION: General:  Elderly white female,  resting on 1L O2,  Neuro:  Awake, alert,Able to make appropriate conversation. HEENT: Atraumatic,normocephalic, white sclera Cardiovascular:S1,S2, No JVD Lungs: diminished throughout, no wheezes, crackles noted Abdomen: soft, non tender, good BS Skin: intact, bruises, no    Recent Labs Lab 09/10/15 0755 09/13/15 0221 09/15/15 0253  NA 135 137 137  K 4.4 4.0 3.4*  CL 98* 96* 93*  CO2 26 30 31   BUN 18 22* 22*  CREATININE 0.75 0.62 0.79  GLUCOSE 129* 139* 121*    Recent Labs Lab 09/08/15 2220 09/10/15 0755 09/12/15 0252  HGB 12.2 11.3* 11.3*  HCT 37.8 33.4* 34.3*  WBC 17.2* 15.3* 6.4  PLT 353 291 308    ASSESSMENT / PLAN:  A Acute on chronic respiratory failure with hypoxia  due to COPD exacerbation Severe Anxiety Hypokalemia 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 Continue to wean steroids. Continue clonipin as per primary Continue to diurese Replace electolytes  Echo pending    Bincy Varughese,AG-ACNP Pulmonary & Critical Care Pulmonary and Critical Care Medicine [facility] Pager: [phone]

## 2015-09-15 NOTE — Care Management Important Message (Signed)
Important Message  Patient Details  Name: [name] Needs MRN: [id number] Date of Birth: August 19, 1934   Medicare Important Message Given:  Yes    Bernadette HoitShoffner, Daichi Moris Coleman 09/15/2015, 8:17 AM

## 2015-09-15 NOTE — Discharge Summary (Signed)
Physician Discharge Summary  Sheri SleeperHazel H Mcdonald ZOX:[id number] DOB: 1934/09/09 DOA: 09/08/2015  PCP: Alysia PennaHOLWERDA, SCOTT, MD  Admit date: 09/08/2015 Discharge date: 09/15/2015  Time spent: 50 minutes  Recommendations for Outpatient Follow-up:   Acute on chronic respiratory failure with hypoxia and hypercarbia due to COPD exacerbation - On 3 Lpm supplemental O2 at home; counseled to titrate to maintain SPO2 89-93% currently SPO2 97 % -Patient becomes tachycardic on bronchodilator nebulizers (part of this reaction could be secondary to medication, but patient very anxious), DC Atrovent, Xopenex.  -Albuterol inhaler PRN SOB -Symbicort 160-4.5 BID -Spiriva daily -Completed levofloxacin seven-day course -Schedule follow-up appointment in 1-2 weeks with Dr.Robert S Byrum PCCM, COPD exacerbation, worsening COPD -Schedule follow-up appointment in 2-3 weeks with Dr. Alysia PennaScott Holwerda for COPD exacerbation, increasing  WOB, hypertrophic cardiomyopathy  Hypertrophic Cardiomyopathy -Patient seen by Dr.Katarina San MorelleH Nelson  [facility]. Patient was last seen on 06/17/2015, at which time patient was instructed to follow-up in 2 months which would have been February -Nebivolol 5 mg daily -K-Dur 40 mEq BID  -Lasix 40 mg BID -Schedule appointment for 1 week follow-[facility]  [facility] hypertropic cardiomyopathy, SOB  Mildly elevated Troponin w/ Hx of CAD  -Trop has climbed to 0.46 , peaked at 0.61 trending down  - no acute findings on EKG  - Asymptomatic - Echocardiogram; pending patient was to have follow-up appointment with Dr. Delton CoombesByrum cardiologist in February, per Dr. Kavin LeechByrum's note if patient does not obtain today may receive one as outpatient   HLD -Lipitor 10 mg daily  Leukocytosis  -Most likely reactive, resolved  Anxiety  -Clonazepam 0.25 mg BID   Discharge Diagnoses:  Principal Problem:   Acute on chronic respiratory failure with hypoxia and hypercapnia (HCC) Active  Problems:   CAD (coronary artery disease), native coronary artery   COPD with exacerbation (HCC)   COPD exacerbation (HCC)   Elevated troponin   Malnutrition of moderate degree   Discharge Condition: Stable  Diet recommendation: Heart healthy  Filed Weights   09/13/15 1500 09/14/15 0233 09/15/15 0600  Weight: 57.2 kg (126 lb 1.7 oz) 57.5 kg (126 lb 12.2 oz) 57.1 kg (125 lb 14.1 oz)    History of present illness:  81 y.o. WF PMHx COPD on 3 L/m supplemental oxygen at home, CAD native artery, HTN, HLD,  Presented to the ED via EMS with acute onset of dyspnea at rest. Patient woke with some slight malaise. Over the course of the day she had exertional dyspnea that was slightly more pronounced than her baseline, but it was not until the evening when she experienced acute worsening with dyspnea at rest. She denied any preceding fevers, chills, rhinorrhea, or sore throat. She activated EMS for transport to the emergency department. Nebulized breathing treatment and 125 mg IV Solu-Medrol were administered en route.  In ED, patient was found to to be afebrile, saturating in the low 80s on 3 L/m supplemental oxygen. She was tachycardic in the low 100s and tachypneic on presentation. Her blood pressure was normal. Chest x-ray was negative for acute cardiopulmonary disease and initial blood work featured a leukocytosis to 17,000. Sinus tachycardia and minimal depression of the ST segment in inferior noted on EKG. Troponin was borderline elevated at 0.04. ABG notable for a pH of 7.26 and pCO2 of 79. Patient was given a continuous nebulized breathing treatment with only minimal improvement. 2 g of magnesium were administered. Levaquin was given empirically. She was placed on BiPAP in the emergency department. During his  hospitalization patient was treated for acute on chronic respiratory failure with hypoxia and hypercapnia secondary to COPD exacerbation. Patient has worsening underlying COPD, and will need  close follow-up with [facility]. In addition patient has hypertrophic cardiomyopathy which appeared compensated. Patient was also started on anxiety medication, patient extremely anxious.    Consultants:  Dr. Nelda Bucks PC CM   Procedure/Significant Events:    Culture 3/1 MRSA by PCR negative   Antibiotics: Levofloxacin 2/28>>    Discharge Exam: Filed Vitals:   09/15/15 0600 09/15/15 0738 09/15/15 0835 09/15/15 0846  BP:   159/76 101/52  Pulse:   111 79  Temp:   97.8 F (36.6 C) 97.7 F (36.5 C)  TempSrc:   Oral Oral  Resp:   17 18  Height:      Weight: 57.1 kg (125 lb 14.1 oz)     SpO2:  98% 99% 99%    General: A/O 4, NAD, positive chronic respiratory distress Eyes: Negative headache, negative scleral hemorrhage ENT: Negative Runny nose, negative gingival bleeding, Neck: Negative scars, masses, torticollis, lymphadenopathy, JVD Lungs: diffuse decreased breath sounds, (baseline), mild expiratory wheezes, negative crackles Cardiovascular: Regular rate and rhythm without murmur gallop or rub normal S1 and S2 Abdomen:negative abdominal pain, nondistended, positive soft, bowel sounds, no rebound, no ascites, no appreciable mass    Discharge Instructions     Medication List    ASK your doctor about these medications        albuterol (2.5 MG/3ML) 0.083% nebulizer solution  Commonly known as:  PROVENTIL  Take 3 mLs (2.5 mg total) by nebulization every 6 (six) hours as needed for wheezing or shortness of breath.     albuterol 108 (90 Base) MCG/ACT inhaler  Commonly known as:  PROVENTIL HFA;VENTOLIN HFA  Inhale 2 puffs into the lungs every 6 (six) hours as needed. For shortness of breath     aspirin 81 MG tablet  Take 81 mg by mouth daily.     atorvastatin 10 MG tablet  Commonly known as:  LIPITOR  Take 10 mg by mouth daily.     budesonide-formoterol 160-4.5 MCG/ACT inhaler  Commonly known as:  SYMBICORT  INHALE 2 PUFFS INTO THE LUNGS 2 (TWO) TIMES  DAILY.     famotidine 20 MG tablet  Commonly known as:  PEPCID  Take 1 tablet 2 times daily for 4 weeks then as needed     multivitamin tablet  Take 1 tablet by mouth daily.     nebivolol 5 MG tablet  Commonly known as:  BYSTOLIC  Take 1 tablet (5 mg total) by mouth daily.     niacin 1000 MG CR tablet  Commonly known as:  NIASPAN  Take 1,000 mg by mouth daily.     Tiotropium Bromide Monohydrate 2.5 MCG/ACT Aers  Commonly known as:  SPIRIVA RESPIMAT  INHALE 2 PUFFS INTO THE LUNGS DAILY.     UNABLE TO FIND  Inhale 3 L into the lungs continuous.       Allergies  Allergen Reactions  . Penicillins Hives    Has taken it since.  . Pneumococcal Vaccine Swelling    Severe localized swelling in arm  . Pneumovax [Pneumococcal Polysaccharide Vaccine] Hives       Follow-up Information    Follow up with [facility].   Why:  HHRN/HHPT   Contact information:   [address] [place] [address] [phone]       Follow up with [facility].   Contact information:   [address] [place] [address] [phone]        The results of significant diagnostics from this hospitalization (including imaging, microbiology, ancillary and laboratory) are listed below for reference.    Significant Diagnostic Studies: Dg Chest Port 1 View  09/14/2015  CLINICAL DATA:  COPD. EXAM: PORTABLE CHEST 1 VIEW COMPARISON:  09/08/2015.  CT 11/26/2014. FINDINGS: Mediastinum hilar structures are normal. Heart size normal. No focal infiltrate. Right apical pleural-parenchymal thickening noted consistent with scarring again noted. Stable calcified right mid lung pulmonary nodule. Otherwise no focal pulmonary abnormality. Mild basal pleural thickening again noted. Changes most likely related to scarring. Tiny pleural effusions cannot be excluded. No pneumothorax. IMPRESSION: 1. Mild basal pleural thickening. Changes most likely related scarring. Tiny  pleural effusions cannot be excluded. 2. Tiny calcified pulmonary nodule right mid lung again noted. This is consistent with a granuloma. Right apical pleural-parenchymal thickening consistent with scarring again noted No focal pulmonary infiltrates or other focal pulmonary abnormality identified. Electronically Signed   By: Maisie Fus  Register   On: 09/14/2015 07:09   Dg Chest Port 1 View  09/08/2015  CLINICAL DATA:  81 year old female with respiratory distress. EXAM: PORTABLE CHEST 1 VIEW COMPARISON:  CT dated 11/26/2014 FINDINGS: Single-view of the chest demonstrates emphysematous changes of the lungs. There is no focal consolidation, pleural effusion, or pneumothorax. The cardiac silhouette is within normal limits. No acute osseous pathology. IMPRESSION: No active disease. Electronically Signed   By: Elgie Collard M.D.   On: 09/08/2015 22:01    Microbiology: Recent Results (from the past 240 hour(s))  MRSA PCR Screening     Status: None   Collection Time: 09/09/15 12:53 PM  Result Value Ref Range Status   MRSA by PCR NEGATIVE NEGATIVE Final    Comment:        The GeneXpert MRSA Assay (FDA approved for NASAL specimens only), is one component of a comprehensive MRSA colonization surveillance program. It is not intended to diagnose MRSA infection nor to guide or monitor treatment for MRSA infections.      Labs: Basic Metabolic Panel:  Recent Labs Lab 09/08/15 2220 09/10/15 0755 09/13/15 0221 09/15/15 0253  NA 139 135 137 137  K 4.3 4.4 4.0 3.4*  CL 97* 98* 96* 93*  CO2 GLUCOSE 179* 129* 139* 121*  BUN 14 18 22* 22*  CREATININE 0.85 0.75 0.62 0.79  CALCIUM 9.6 9.0 9.6 9.5  MG  --   --   --  1.9  PHOS  --   --   --  4.4   Liver Function Tests:  Recent Labs Lab 09/08/15 2220 09/10/15 0755 09/13/15 0221  AST ALT ALKPHOS 46 36* 34*  BILITOT 0.2* 0.4 0.3  PROT 6.9 5.7* 5.7*  ALBUMIN 4.1 3.6 3.4*   No results for input(s): LIPASE,  AMYLASE in the last 168 hours. No results for input(s): AMMONIA in the last 168 hours. CBC:  Recent Labs Lab 09/08/15 2220 09/10/15 0755 09/12/15 0252  WBC 17.2* 15.3* 6.4  NEUTROABS 12.6*  --   --   HGB 12.2 11.3* 11.3*  HCT 37.8 33.4* 34.3*  MCV 94.3 93.3 91.5  PLT 353 291 308   Cardiac Enzymes:  Recent Labs Lab 09/09/15 0731 09/09/15 1457 09/09/15 1915 09/10/15 0020 09/10/15 0755  TROPONINI 0.40* 0.46* 0.54* 0.61* 0.40*   BNP: BNP (last 3 results)  Recent Labs  09/09/15 0255  BNP 58.0    ProBNP (last 3 results) No results for input(s): PROBNP in the last 8760 hours.  CBG:  Recent Labs Lab 09/09/15 0844 09/11/15 0759 09/12/15 0821 09/13/15 1939 09/14/15 0828  GLUCAP 139* 110* 117* 177* 135*       Signed:  Carolyne Littles, MD [facility] [phone] pager

## 2015-09-17 DIAGNOSIS — I10 Essential (primary) hypertension: Secondary | ICD-10-CM | POA: Diagnosis not present

## 2015-09-17 DIAGNOSIS — I2510 Atherosclerotic heart disease of native coronary artery without angina pectoris: Secondary | ICD-10-CM | POA: Diagnosis not present

## 2015-09-17 DIAGNOSIS — J441 Chronic obstructive pulmonary disease with (acute) exacerbation: Secondary | ICD-10-CM | POA: Diagnosis not present

## 2015-09-17 DIAGNOSIS — Z9981 Dependence on supplemental oxygen: Secondary | ICD-10-CM | POA: Diagnosis not present

## 2015-09-18 ENCOUNTER — Telehealth: Payer: Self-pay | Admitting: Emergency Medicine

## 2015-09-18 MED ORDER — FLUCONAZOLE 100 MG PO TABS: ORAL_TABLET | ORAL | Status: DC

## 2015-09-18 NOTE — Telephone Encounter (Signed)
ATC, NA, unable to leave VM. WCB

## 2015-09-18 NOTE — Telephone Encounter (Signed)
Called spoke with pt and is aware of recs below. RX has been sent in. Nothing further needed

## 2015-09-18 NOTE — Telephone Encounter (Signed)
Please call in fluconazole 200 mg for 1 day and then 100 mg daily for 2 additional days then stop

## 2015-09-18 NOTE — Telephone Encounter (Signed)
Patient states that she has thrush and would like something called in for it.  She said that she just got out of the hospital and every time she goes to the hospital she comes home with thrush.  Pharmacy:  [facility] [place]  Allergies  Allergen Reactions  . Penicillins Hives    Has taken it since.  . Pneumococcal Vaccine Swelling    Severe localized swelling in arm  . Pneumovax [Pneumococcal Polysaccharide Vaccine] Hives

## 2015-09-23 ENCOUNTER — Encounter: Payer: Self-pay | Admitting: Physician Assistant

## 2015-09-23 ENCOUNTER — Ambulatory Visit (INDEPENDENT_AMBULATORY_CARE_PROVIDER_SITE_OTHER): Payer: PPO | Admitting: Physician Assistant

## 2015-09-23 VITALS — BP 110/62 | HR 76 | Ht 61.0 in | Wt 124.0 lb

## 2015-09-23 DIAGNOSIS — R778 Other specified abnormalities of plasma proteins: Secondary | ICD-10-CM

## 2015-09-23 DIAGNOSIS — R0989 Other specified symptoms and signs involving the circulatory and respiratory systems: Secondary | ICD-10-CM | POA: Diagnosis not present

## 2015-09-23 DIAGNOSIS — I10 Essential (primary) hypertension: Secondary | ICD-10-CM | POA: Diagnosis not present

## 2015-09-23 DIAGNOSIS — I429 Cardiomyopathy, unspecified: Secondary | ICD-10-CM | POA: Diagnosis not present

## 2015-09-23 DIAGNOSIS — E785 Hyperlipidemia, unspecified: Secondary | ICD-10-CM | POA: Diagnosis not present

## 2015-09-23 DIAGNOSIS — I2510 Atherosclerotic heart disease of native coronary artery without angina pectoris: Secondary | ICD-10-CM

## 2015-09-23 DIAGNOSIS — R7989 Other specified abnormal findings of blood chemistry: Secondary | ICD-10-CM

## 2015-09-23 LAB — BASIC METABOLIC PANEL
BUN: 20 mg/dL (ref 7–25)
CHLORIDE: 93 mmol/L — AB (ref 98–110)
CO2: 34 mmol/L — AB (ref 20–31)
CREATININE: 0.91 mg/dL (ref 0.60–0.93)
Calcium: 9.6 mg/dL (ref 8.6–10.4)
Glucose, Bld: 106 mg/dL — ABNORMAL HIGH (ref 65–99)
POTASSIUM: 3.3 mmol/L — AB (ref 3.5–5.3)
Sodium: 140 mmol/L (ref 135–146)

## 2015-09-23 MED ORDER — POTASSIUM CHLORIDE ER 10 MEQ PO TBCR: 20.0000 meq | EXTENDED_RELEASE_TABLET | Freq: Every day | ORAL | Status: DC

## 2015-09-23 MED ORDER — FUROSEMIDE 20 MG PO TABS: 20.0000 mg | ORAL_TABLET | Freq: Every day | ORAL | Status: DC

## 2015-09-23 NOTE — Patient Instructions (Addendum)
Medication Instructions:  Your physician has recommended you make the following change in your medication:  1.  DECREASE the Lasix to 20 mg taking 1 tablet daily 2.  DECREASE the Potassium to 10 meq taking 2 tablets daily   Labwork: TODAY:  BMET  Testing/Procedures: None ordered  Follow-Up: Your physician recommends that you schedule a follow-up appointment WITH DR. Delton SeeNELSON @ HER 1ST AVAILABLE   Any Other Special Instructions Will Be Listed Below (If Applicable).     If you need a refill on your cardiac medications before your next appointment, please call your pharmacy.

## 2015-09-23 NOTE — Progress Notes (Signed)
Patient ID: BRAELEY BUSKEY, female   DOB: [birth year], 81 y.o.   MRN: [id number]    Date:  09/23/2015   ID:  JULIETA ROGALSKI, DOB [birth year], MRN [id number]  PCP:  [name], MD  Primary Cardiologist:  Daisey Must chief complaint on file.    History of Present Illness: GOLDIA LIGMAN is a 81 y.o. female a hx of PAD, HTN, COPD. She's had a question of infrarenal abdominal aorta dilatation on CT in the past. Subsequent ultrasounds have been negative for aneurysm. She last saw Dr. Daleen Squibb 8/12 after undergoing a "Life Screening Survey". This demonstrated mild carotid artery stenosis bilaterally (no percentage given) and mildly reduced ABIs bilaterally (0.9). Recommendation was to repeat her carotid Dopplers in 02/2013. Admitted 04/2012 with a COPD exacerbation. She was followed by pulmonary/critical care. Chest CT was negative for pulmonary embolism. She did have multiple lung nodules noted and needs followup CT in 3-6 months. Echocardiogram 04/16/12: Mild focal basal septal hypertrophy, EF 70-75% with dynamic obstruction with mid cavity obliteration, grade 1 diastolic dysfunction, MAC, atrial septal thickening with findings consistent with lipomatous hypertrophy.   Dr. Delton See saw her 05/09/12 and titrated her beta blocker given her hypertrophic cardiomyopathy to slow her HR some. She remains on chronic O2. Chronic dyspnea.  She saw her again on 07/23/12 and she complained of chest pain. Of note, she had coronary calcifications on chest CT in 04/2012. Dobutamine Myoview 08/06/12: No ischemia, EF 85%.    Labs (10/13): K 4.5, creatinine 0.76, ALT 16, Hgb 14  R LE ultrasound 10/13: Negative for DVT   02/03/2014 - the patient complains of worsening dyspnea on exertion after walking just a few feet. She denies any chest pain, palpitations or syncope. She denies any paroxysmal nocturnal dyspnea. She continues using home O2 therapy. She also states that her toes discoloration of her toes is getting worse.  She denies any claudications. She also complains of intermittent lower extremity edema around her ankles bilaterally.  06/17/2015 - patient is coming after one year, since the last year her oxygen requirements have increased, she is on 24/7 oxygen therapy through nasal cannula, she is minimally active and feels short of breath at baseline. She is using rescue inhalers to alleviate some of the symptoms but they don't seem to be working well lately. She denies any chest pain, no orthopnea or paroxysmal nocturnal dyspnea, no lower extremity edema.She also denies palpitations or syncope.  Mrs. Clayton presents for post-hospital evaluation. She was admitted from March 1 to March 7th  We did not see her while she was there.   She had an echocardiogram 09/15/2015(the day of discharge), which revealed her ejection fraction is 40-45%, normal wall motion, grade 1 diastolic dysfunction.   Previous echo in 2013 revealed her EF of 70-75%.   She was treated for acute on chronic respiratory failure with hypoxia and hypercarbia due to COPD exacerbation.   Troponin peaked at 0.61 with no acute EKG findings.   On March 6 she was seen by critical care who noted elevated JVD.  CXR revealed possible "tiny" pleural effusion but no CHF or edema.  She was then started onIV Lasix 40 mg twice daily.   Looks like her weight at admission was 134 pounds and at discharge it was 125. Although, from the time she started lasix to discharge, there was very little weight change.  BNP on 3/1 was 58.0.  Today she is 124 pounds. She feels weak and has been having trouble  taking the potassium because it burns her stomach.  It is time release and was told not to crush it.  The patient currently denies nausea, vomiting, fever, chest pain or ever having it in the last six months, shortness of breath beyond basline, orthopnea, dizziness, PND, cough, congestion, abdominal pain, hematochezia, melena, lower extremity edema, claudication.  Wt Readings  from Last 3 Encounters:  09/23/15 124 lb (56.246 kg)  09/15/15 125 lb 14.1 oz (57.1 kg)  06/17/15 126 lb (57.153 kg)     Past Medical History  Diagnosis Date  . PVD (peripheral vascular disease) (HCC)   . Actinomycosis, cervicofacial   . Emphysema   . COPD (chronic obstructive pulmonary disease) (HCC)   . HTN (hypertension)   . Hx of echocardiogram     Echocardiogram 04/16/12: Mild focal basal septal hypertrophy, EF 70-75% with dynamic obstruction with mid cavity obliteration, grade 1 diastolic dysfunction, MAC, atrial septal thickening with findings consistent with lipomatous hypertrophy  . Hyperlipidemia   . CAD (coronary artery disease)     a. coronary calcification on prior Chest CT;  b. Dob MV 1/14:  EF 85%, no ischemia    Current Outpatient Prescriptions  Medication Sig Dispense Refill  . albuterol (PROVENTIL HFA;VENTOLIN HFA) 108 (90 BASE) MCG/ACT inhaler Inhale 2 puffs into the lungs every 6 (six) hours as needed. For shortness of breath    . aspirin 81 MG tablet Take 81 mg by mouth daily.      Marland Kitchen atorvastatin (LIPITOR) 10 MG tablet Take 10 mg by mouth daily.      . budesonide-formoterol (SYMBICORT) 160-4.5 MCG/ACT inhaler INHALE 2 PUFFS INTO THE LUNGS 2 (TWO) TIMES DAILY. 3 Inhaler 3  . clonazePAM (KLONOPIN) 0.5 MG tablet Take 0.5 tablets (0.25 mg total) by mouth 2 (two) times daily. 60 tablet 0  . famotidine (PEPCID) 20 MG tablet Take 1 tablet 2 times daily for 4 weeks then as needed 60 tablet 2  . fluconazole (DIFLUCAN) 100 MG tablet Take 2 tablets on day one and then once daily x 2 days 4 tablet 0  . furosemide (LASIX) 20 MG tablet Take 1 tablet (20 mg total) by mouth daily. 90 tablet 3  . Multiple Vitamin (MULTIVITAMIN) tablet Take 1 tablet by mouth daily.      . nebivolol (BYSTOLIC) 5 MG tablet Take 1 tablet (5 mg total) by mouth daily. 30 tablet   . niacin (NIASPAN) 1000 MG CR tablet Take 1,000 mg by mouth daily.    . Tiotropium Bromide Monohydrate (SPIRIVA RESPIMAT) 2.5  MCG/ACT AERS INHALE 2 PUFFS INTO THE LUNGS DAILY. 3 Inhaler 3  . UNABLE TO FIND Inhale 3 L into the lungs continuous.    . potassium chloride (K-DUR) 10 MEQ tablet Take 2 tablets (20 mEq total) by mouth daily. 180 tablet 3   No current facility-administered medications for this visit.    Allergies:    Allergies  Allergen Reactions  . Penicillins Hives    Has taken it since.  . Pneumococcal Vaccine Swelling    Severe localized swelling in arm  . Pneumovax [Pneumococcal Polysaccharide Vaccine] Hives    Social History:  The patient  reports that she quit smoking about 10 years ago. Her smoking use included Cigarettes. She has a 49 pack-year smoking history. She has never used smokeless tobacco. She reports that she does not drink alcohol or use illicit drugs.   Family history:   Family History  Problem Relation Age of Onset  . Coronary artery disease  family hx of female 1st. degree relative <50/family hx of 1st. degree relative <60  . Emphysema Father   . Heart disease Father   . Stomach cancer Father   . Emphysema      3 sisters. they were smokers  . Stomach cancer Sister   . Emphysema Sister   . Heart disease Mother   . Coronary artery disease Brother     ROS:  Please see the history of present illness.  All other systems reviewed and negative.   PHYSICAL EXAM: VS:  BP 110/62 mmHg  Pulse 76  Ht  (1.549 m)  Wt 124 lb (56.246 kg)  BMI 23.44 kg/m2  SpO2 94% Well nourished, well developed, in no acute distress.  She is sitting comfortably in a wheel chair with O2 via [place] HEENT: Pupils are equal round react to light accommodation extraocular movements are intact.  Neck: no JVDNo cervical lymphadenopathy. Cardiac: Regular rate and rhythm without murmurs rubs or gallops. Lungs:  clear to auscultation bilaterally, no wheezing, rhonchi or rales Abd: soft, nontender, positive bowel sounds all quadrants,  Ext: no lower extremity edema.  2+ radial and dorsalis pedis  pulses. Skin: warm and dry Neuro:  Grossly normal   ASSESSMENT AND PLAN:  Problem List Items Addressed This Visit    Hyperlipidemia - Primary (Chronic)   Relevant Medications   furosemide (LASIX) 20 MG tablet   HTN (hypertension) (Chronic)   Relevant Medications   furosemide (LASIX) 20 MG tablet   Other Relevant Orders   Basic Metabolic Panel (BMET)   Elevated troponin   Cardiomyopathy (HCC)   Relevant Medications   furosemide (LASIX) 20 MG tablet   CAD in native artery   Relevant Medications   furosemide (LASIX) 20 MG tablet    Other Visit Diagnoses    Decreased pedal pulses            Miss Mcglown presented for posthospital evaluation. Her most recent echocardiogram revealed her ejection fraction decreased to 40-45% with no distinct wall motion abnormalities. Given started on Lasix  40 mg twice daily the day before her discharge  And has continued to take this. Her weight is decreased  2 about 119 pounds  On her scale since her discharge.  She denies any dizziness however, she is not very active and only walks a little bit with a walker. She stopped taking the potassium because of his burning throat and stomach. It was time release she was told not to crush it.   At this time she appears euvolemic. I have decreased herLas to 40 mg once daily and switched her potassium K-Dur 10 mg tablets with a dose of 20 daily.    I am also checking a basic metabolic panel today since she has not been taking potassium.   Her troponin did elevate in the hospital to about point 0.61 and I suspect this is demand ischemia due to her hypoxic respiratory failure which was secondary to COPD exacerbation.   She reports never having any chest pain in the last several months.  She was given 2 doses of IV Lasix and switch to by mouth however didn't see any concrete evidence that she was volume overloaded her initial BNP was only 58 there is no CHF indicated on her chest x-ray on March 6.   I suggested doing a  stress test which would be a dobutamine test and she was not too fond of this idea as it made her feel extremely terrible the last  time.   I'll  Discussed this with Dr.Nelson her primary cardiolgist.

## 2015-09-29 ENCOUNTER — Telehealth: Payer: Self-pay | Admitting: Cardiology

## 2015-09-29 DIAGNOSIS — I10 Essential (primary) hypertension: Secondary | ICD-10-CM | POA: Diagnosis not present

## 2015-09-29 DIAGNOSIS — Z9981 Dependence on supplemental oxygen: Secondary | ICD-10-CM | POA: Diagnosis not present

## 2015-09-29 DIAGNOSIS — J441 Chronic obstructive pulmonary disease with (acute) exacerbation: Secondary | ICD-10-CM | POA: Diagnosis not present

## 2015-09-29 DIAGNOSIS — I2510 Atherosclerotic heart disease of native coronary artery without angina pectoris: Secondary | ICD-10-CM | POA: Diagnosis not present

## 2015-09-29 NOTE — Telephone Encounter (Signed)
Can we reschedule her appointment so she can make it to her pulmonary appointment?

## 2015-09-29 NOTE — Telephone Encounter (Signed)
She needs to see one of PAs on the day other than when she has pulmonary appointment

## 2015-09-29 NOTE — Telephone Encounter (Signed)
Noted in pts appointments, that she has a follow-up with Dr Delton SeeNelson on next Monday 10/05/15 at 1100, [facility], for recent decrease in lasix and Kdur.  Pt also has a follow-up appt tomorrow 09/30/15 with her Pulmonologist Tammy Parrett at 1115, for post-hospital follow-up from recent respiratory distress and COPD.

## 2015-09-29 NOTE — Telephone Encounter (Signed)
Her Pulmonary appt is tomorrow 3/22 and her appt with Dr Delton SeeNelson is arranged for 10/05/15.  Pt will see Pulmonary before Cardiology. Will route this message to Dr Delton SeeNelson as an Lorain Childesfyi.

## 2015-09-29 NOTE — Telephone Encounter (Signed)
Pt c/o Shortness Of Breath: STAT if SOB developed within the last 24 hours or pt is noticeably SOB on the phone  1. Are you currently SOB (can you hear that pt is SOB on the phone)? Yes-   2. How long have you been experiencing SOB? Since last OV- 3/15  3. Are you SOB when sitting or when up moving around? Both   4. Are you currently experiencing any other symptoms? Weight gain,

## 2015-09-29 NOTE — Telephone Encounter (Signed)
[facility] to report patient symptoms. She st the patient has worsening SOB (she is SOB at baseline but has gotten progressively worse in the past week). She has gained 3 pounds since last week - she currently weighs 121 lbs. She reports no noticeable swelling. RN reports she had to help her walk with her walker today, which is not usual.  VS today:         BP 148/82         HR 76         O2 96% on 1/2L [place] She has been taking medications as instructed.  At last OV 09/23/15, her Lasix was decreased from 40 mg BID to 20 mg daily. Her symptoms starting worsening a couple days later.  To Dr. Delton SeeNelson for recommendations.

## 2015-09-29 NOTE — Telephone Encounter (Signed)
Will route to Dr Delton SeeNelson for further review and recommendation of message taken.  Will follow-up with the pt or [facility] RN with any new recommendations per Dr Delton SeeNelson.

## 2015-09-30 ENCOUNTER — Ambulatory Visit (INDEPENDENT_AMBULATORY_CARE_PROVIDER_SITE_OTHER): Payer: PPO | Admitting: Adult Health

## 2015-09-30 ENCOUNTER — Encounter: Payer: Self-pay | Admitting: Adult Health

## 2015-09-30 VITALS — BP 106/76 | HR 80 | Temp 98.0°F | Ht 61.0 in | Wt 122.0 lb

## 2015-09-30 DIAGNOSIS — J449 Chronic obstructive pulmonary disease, unspecified: Secondary | ICD-10-CM | POA: Diagnosis not present

## 2015-09-30 DIAGNOSIS — J441 Chronic obstructive pulmonary disease with (acute) exacerbation: Secondary | ICD-10-CM | POA: Diagnosis not present

## 2015-09-30 MED ORDER — POTASSIUM CHLORIDE CRYS ER 20 MEQ PO TBCR: 20.0000 meq | EXTENDED_RELEASE_TABLET | Freq: Every day | ORAL | Status: DC

## 2015-09-30 MED ORDER — FUROSEMIDE 40 MG PO TABS: 40.0000 mg | ORAL_TABLET | Freq: Every day | ORAL | Status: DC

## 2015-09-30 NOTE — Assessment & Plan Note (Addendum)
Continue on Symbicort and Spiriva .  Take extra Lasix today .  follow up with Dr. Nelson next week as planned.  Follow up with Dr. Byrum in 2 months and As needed   Please contact office for sooner follow up if symptoms do not improve or worsen or seek emergency care   

## 2015-09-30 NOTE — Patient Instructions (Signed)
Continue on Symbicort and Spiriva .  Take extra Lasix today .  follow up with Dr. Delton SeeNelson next week as planned.  Follow up with Dr. Delton CoombesByrum in 2 months and As needed   Please contact office for sooner follow up if symptoms do not improve or worsen or seek emergency care

## 2015-09-30 NOTE — Addendum Note (Signed)
Addended by: Loa SocksMARTIN, Kanitra Purifoy M on: 09/30/2015 03:05 PM   Modules accepted: Orders, Medications

## 2015-09-30 NOTE — Telephone Encounter (Signed)
How can I do med change without seeing her? That's why she is coming the next Monday.

## 2015-09-30 NOTE — Telephone Encounter (Addendum)
Contacted the pt and her spouse answered the phone and states she was asleep at this time.  Our records indicate the pts Spouse is on her DPR.  Per the pts Husband, he states he does most of her care and talks mostly for her when it comes to her health. Being the pts husband is on the DPR,  I informed him that per Dr Delton SeeNelson, the pt needs to take lasix 40 mg po daily and KCL 20 mEq po daily.  Increase in both Lasix and KC  is for pts complaints mentioned yesterday about her worsening sob and weight gain, since her lasix and K was decreased on 3/15  by Jones SkeneBryan Hagar PA. Confirmed the pharmacy of choice with the pts spouse, and he states that they have plenty of both lasix and KCL on-hand.  Per the husband, the pt went in to see her Pulmonary MD today, and Husband states that the pt received a good report.  Pt's spouse is aware that the pt has a follow-up appt with Dr Delton SeeNelson on 3/27.  Spouse verbalized understanding and agrees with this plan. Spouse states he will endorse these recommendations to the pt once she wakes up from her nap.

## 2015-09-30 NOTE — Progress Notes (Signed)
Subjective:    Patient ID: [name], female    DOB: Jul 23, 1934, 81 y.o.   MRN: [id number]  HPI 81 yo female former smoker with COPD , D CHF .  Pulmonary nodules -serial CT x 2 years unchanged  Hypertrophic CM followed by Dr. Delton See  TEST  11/2014 CT chest >>resolution of RUL nodule, stable bilateral nodules x 2 yrs.  Echo 09/2015 EF 65-70%, Gr 1 DD , mild focal basal hypertrophy of the septum.    09/30/2015 Post Hospital follow up  Pt returns for a post hospital follow up .  Pt was admitted few weeks ago for COPD exacerbation with acute on chronic resp failure , tx w/ abx, steroids and nebs.and mild diuresis .  She did require initial BIPAP support. Echo showed EF 65%, Gr 1 DD .  BNP was nml .  Still has dry cough and DOE. Gets winded very easily . Has little energy, wears out with min act.  Does have some ankle swelling worse in evenings.  Remains on Symbicort and Spiriva .  On O2 at 1 to 1.5L .  She denies fever, discolored mucus, hemoptysis, chest pain , orthopnea,.      Past Medical History  Diagnosis Date  . PVD (peripheral vascular disease) (HCC)   . Actinomycosis, cervicofacial   . Emphysema   . COPD (chronic obstructive pulmonary disease) (HCC)   . HTN (hypertension)   . Hx of echocardiogram     Echocardiogram 04/16/12: Mild focal basal septal hypertrophy, EF 70-75% with dynamic obstruction with mid cavity obliteration, grade 1 diastolic dysfunction, MAC, atrial septal thickening with findings consistent with lipomatous hypertrophy  . Hyperlipidemia   . CAD (coronary artery disease)     a. coronary calcification on prior Chest CT;  b. Dob MV 1/14:  EF 85%, no ischemia   Current Outpatient Prescriptions on File Prior to Visit  Medication Sig Dispense Refill  . albuterol (PROVENTIL HFA;VENTOLIN HFA) 108 (90 BASE) MCG/ACT inhaler Inhale 2 puffs into the lungs every 6 (six) hours as needed. For shortness of breath    . aspirin 81 MG tablet Take 81 mg by mouth daily.       Marland Kitchen atorvastatin (LIPITOR) 10 MG tablet Take 10 mg by mouth daily.      . budesonide-formoterol (SYMBICORT) 160-4.5 MCG/ACT inhaler INHALE 2 PUFFS INTO THE LUNGS 2 (TWO) TIMES DAILY. 3 Inhaler 3  . clonazePAM (KLONOPIN) 0.5 MG tablet Take 0.5 tablets (0.25 mg total) by mouth 2 (two) times daily. 60 tablet 0  . famotidine (PEPCID) 20 MG tablet Take 1 tablet 2 times daily for 4 weeks then as needed 60 tablet 2  . Multiple Vitamin (MULTIVITAMIN) tablet Take 1 tablet by mouth daily.      . nebivolol (BYSTOLIC) 5 MG tablet Take 1 tablet (5 mg total) by mouth daily. 30 tablet   . niacin (NIASPAN) 1000 MG CR tablet Take 1,000 mg by mouth daily.    . Tiotropium Bromide Monohydrate (SPIRIVA RESPIMAT) 2.5 MCG/ACT AERS INHALE 2 PUFFS INTO THE LUNGS DAILY. 3 Inhaler 3  . UNABLE TO FIND Inhale 1.5-3 L into the lungs continuous.     . furosemide (LASIX) 40 MG tablet Take 1 tablet (40 mg total) by mouth daily. 90 tablet 3   No current facility-administered medications on file prior to visit.     Review of Systems Constitutional:   No  weight loss, night sweats,  Fevers, chills, + fatigue, or  lassitude.  HEENT:  No headaches,  Difficulty swallowing,  Tooth/dental problems, or  Sore throat,                No sneezing, itching, ear ache,  +nasal congestion, post nasal drip,   CV:  No chest pain,  Orthopnea, PND, swelling in lower extremities, anasarca, dizziness, palpitations, syncope.   GI  No heartburn, indigestion, abdominal pain, nausea, vomiting, diarrhea, change in bowel habits, loss of appetite, bloody stools.   Resp:  No chest wall deformity  Skin: no rash or lesions.  GU: no dysuria, change in color of urine, no urgency or frequency.  No flank pain, no hematuria   MS:  No joint pain or swelling.  No decreased range of motion.  No back pain.  Psych:+ anxiety.  No memory loss.         Objective:   Physical Exam Filed Vitals:   09/30/15 1116  BP: 106/76  Pulse: 80  Temp: 98  F (36.7 C)  TempSrc: Oral  Height: 5\' 1"  (1.549 m)  Weight: 122 lb (55.339 kg)  SpO2: 97%   GEN: A/Ox3; pleasant , NAD, frail and elderly   HEENT:  [place]/AT,  EACs-clear, TMs-wnl, NOSE-clear, THROAT-clear, no lesions, no postnasal drip or exudate noted.   NECK:  Supple w/ fair ROM; no JVD; normal carotid impulses w/o bruits; no thyromegaly or nodules palpated; no lymphadenopathy.  RESP  Decreased BS in bases , no accessory muscle use, no dullness to percussion  CARD:  RRR, no m/r/g  , tr  peripheral edema, pulses intact, no cyanosis or clubbing.  GI:   Soft & nt; nml bowel sounds; no organomegaly or masses detected.  Musco: Warm bil, no deformities or joint swelling noted.   Neuro: alert, no focal deficits noted.    Skin: Warm, no lesions or rashes  Odarius Dines NP-C  [facility]  09/30/15        Assessment & Plan:

## 2015-09-30 NOTE — Telephone Encounter (Signed)
Spoke with Dr Delton SeeNelson, and she would like for the pt to go back on her original dose of lasix

## 2015-09-30 NOTE — Telephone Encounter (Signed)
Clarification will need to be confirmed with what current dose of lasix and potassium will she want the pt to take until she see's her next Monday.

## 2015-09-30 NOTE — Telephone Encounter (Signed)
She should be taking lasix 40 mg daily and KCl 20 mEq daily.

## 2015-09-30 NOTE — Telephone Encounter (Addendum)
Will send Dr Delton SeeNelson a message stating all appts are arranged appropriately and she will see Pulmonologist today 3/22 and will see Dr Delton SeeNelson on next Monday 3/27, as ordered by Jones SkeneBryan Hagar at last weeks OV for recent decrease of pts Lasix and K-dur.   Need clarification from Dr Delton SeeNelson asking if she wants to make medication recommendations on the pt now, or wait until the pt see's Pulmonology today 3/22 or Nelson on 3/27.     See below the original note of when [facility] nurse called into triage to report on the pt on 3/21:  Henrietta DineKathryn A Kemp, RN at 09/29/2015 9:03 AM     Status: Signed       Expand All Collapse All   [facility] called to report patient symptoms. She st the patient has worsening SOB (she is SOB at baseline but has gotten progressively worse in the past week). She has gained 3 pounds since last week - she currently weighs 121 lbs. She reports no noticeable swelling. RN reports she had to help her walk with her walker today, which is not usual.  VS today:   BP 148/82   HR 76   O2 96% on 1/2L [place] She has been taking medications as instructed.  At last OV 09/23/15, her Lasix was decreased from 40 mg BID to 20 mg daily. Her symptoms started worsening a couple days later. Pt has appt with Pulmonology on 3/22 and appt with Dr Delton SeeNelson on 3/27.  To Dr. Delton SeeNelson for recommendation if changes to the pts meds should be addressed now or wait to advise when she see's the pt in the office on 3/27.

## 2015-10-02 ENCOUNTER — Telehealth: Payer: Self-pay | Admitting: Cardiology

## 2015-10-02 DIAGNOSIS — I10 Essential (primary) hypertension: Secondary | ICD-10-CM | POA: Diagnosis not present

## 2015-10-02 DIAGNOSIS — I2510 Atherosclerotic heart disease of native coronary artery without angina pectoris: Secondary | ICD-10-CM | POA: Diagnosis not present

## 2015-10-02 DIAGNOSIS — Z9981 Dependence on supplemental oxygen: Secondary | ICD-10-CM | POA: Diagnosis not present

## 2015-10-02 DIAGNOSIS — J441 Chronic obstructive pulmonary disease with (acute) exacerbation: Secondary | ICD-10-CM | POA: Diagnosis not present

## 2015-10-02 MED ORDER — POTASSIUM CHLORIDE CRYS ER 20 MEQ PO TBCR: 40.0000 meq | EXTENDED_RELEASE_TABLET | Freq: Every day | ORAL | Status: DC

## 2015-10-02 NOTE — Telephone Encounter (Signed)
Spoke with Graybar ElectricBeth RN, VSS but pt has SOB worsening with movement/ SPO2 95% on 1.5L O2, no temp/slight dry cough, no edema,  Reviewed with Dr Delton SeeNelson and orders were as follows. Pt to take an extra lasix 40 mg and increase potassium to 40 meq daily. Pt to go to ED if symptoms worsen.  Pt aware of f/u app with Dr Delton SeeNelson 10/05/15 pts husband was called and was given update. He repeated orders and  verbalized understanding.

## 2015-10-02 NOTE — Telephone Encounter (Signed)
[name]  W/ [facility] is calling to give a update on her status . Wanted to let Dr. Delton SeeNelson know that there is still no change in her breathing , has taken off a little over 2lbs of her weight . Still problems breathing , Vital Signs are good . Please call    Thanks

## 2015-10-02 NOTE — Telephone Encounter (Signed)
LMTCB with further update,

## 2015-10-02 NOTE — Telephone Encounter (Signed)
Also [name] wanted to add that her breathing was so bad that her husband had to turn oxygen level up to 1 1/2 liters

## 2015-10-05 ENCOUNTER — Encounter: Payer: Self-pay | Admitting: Cardiology

## 2015-10-05 ENCOUNTER — Ambulatory Visit (INDEPENDENT_AMBULATORY_CARE_PROVIDER_SITE_OTHER): Payer: PPO | Admitting: Cardiology

## 2015-10-05 VITALS — BP 126/70 | HR 61 | Ht 61.0 in | Wt 120.0 lb

## 2015-10-05 DIAGNOSIS — I739 Peripheral vascular disease, unspecified: Secondary | ICD-10-CM

## 2015-10-05 DIAGNOSIS — I2510 Atherosclerotic heart disease of native coronary artery without angina pectoris: Secondary | ICD-10-CM

## 2015-10-05 DIAGNOSIS — I2583 Coronary atherosclerosis due to lipid rich plaque: Secondary | ICD-10-CM

## 2015-10-05 DIAGNOSIS — R0609 Other forms of dyspnea: Secondary | ICD-10-CM

## 2015-10-05 DIAGNOSIS — R778 Other specified abnormalities of plasma proteins: Secondary | ICD-10-CM

## 2015-10-05 DIAGNOSIS — I5022 Chronic systolic (congestive) heart failure: Secondary | ICD-10-CM | POA: Diagnosis not present

## 2015-10-05 DIAGNOSIS — R7989 Other specified abnormal findings of blood chemistry: Secondary | ICD-10-CM

## 2015-10-05 DIAGNOSIS — R0600 Dyspnea, unspecified: Secondary | ICD-10-CM

## 2015-10-05 LAB — BASIC METABOLIC PANEL
BUN: 19 mg/dL (ref 7–25)
CO2: 30 mmol/L (ref 20–31)
Calcium: 10.1 mg/dL (ref 8.6–10.4)
Chloride: 99 mmol/L (ref 98–110)
Creat: 0.69 mg/dL (ref 0.60–0.93)
Glucose, Bld: 93 mg/dL (ref 65–99)
Potassium: 5 mmol/L (ref 3.5–5.3)
Sodium: 138 mmol/L (ref 135–146)

## 2015-10-05 LAB — BRAIN NATRIURETIC PEPTIDE: Brain Natriuretic Peptide: 20.1 pg/mL (ref <100)

## 2015-10-05 NOTE — Patient Instructions (Signed)
Medication Instructions:  Your physician recommends that you continue on your current medications as directed. Please refer to the Current Medication list given to you today.   Labwork: Lab work to be done today--BMP, BNP  Testing/Procedures: none  Follow-Up: Your physician recommends that you schedule a follow-up appointment in: 4 weeks with NP or PA.    Any Other Special Instructions Will Be Listed Below (If Applicable).     If you need a refill on your cardiac medications before your next appointment, please call your pharmacy.

## 2015-10-05 NOTE — Progress Notes (Signed)
Patient ID: [name], female   DOB: Jul 02, 1935, 81 y.o.   MRN: [id number]   Date:  10/05/2015   ID:  [name], DOB Jul 02, 1935, MRN [id number]  PCP:  Alysia PennaHOLWERDA, SCOTT, MD  Primary Cardiologist:  Delton SeeNelson   Chief complain:    History of Present Illness: Sheri SleeperHazel H Mcdonald is a 81 y.o. female a hx of PAD, HTN, COPD. She's had a question of infrarenal abdominal aorta dilatation on CT in the past. Subsequent ultrasounds have been negative for aneurysm. She last saw Dr. Daleen SquibbWall 8/12 after undergoing a "Life Screening Survey". This demonstrated mild carotid artery stenosis bilaterally (no percentage given) and mildly reduced ABIs bilaterally (0.9). Recommendation was to repeat her carotid Dopplers in 02/2013. Admitted 04/2012 with a COPD exacerbation. She was followed by pulmonary/critical care. Chest CT was negative for pulmonary embolism. She did have multiple lung nodules noted and needs followup CT in 3-6 months. Echocardiogram 04/16/12: Mild focal basal septal hypertrophy, EF 70-75% with dynamic obstruction with mid cavity obliteration, grade 1 diastolic dysfunction, MAC, atrial septal thickening with findings consistent with lipomatous hypertrophy.  Dr. Delton SeeNelson saw her 05/09/12 and titrated her beta blocker given her hypertrophic cardiomyopathy to slow her HR some. She remains on chronic O2. Chronic dyspnea.  She saw her again on 07/23/12 and she complained of chest pain. Of note, she had coronary calcifications on chest CT in 04/2012. Dobutamine Myoview 08/06/12: No ischemia, EF 85%.    The patient was hospitalized March 1 of 09/15/2015 for acute on chronic COPD exacerbation with pneumonia. With hypoxia and hypercarbia. Her troponin was elevated 0.61 with no acute EKG findings, she was treated for acute diastolic CHF exacerbation as well, weight at admission was 134 pounds and at discharge it was 125. BNP on 3/1 was 58.0.   She was seen by a PA in the clinic shortly after hospitalization with minimal  improvement in her shortness of breath. She has a visiting nurse who decreased the dose of her diuretics and the patient immobile lower extremity edema and worsening shortness of breath that for her Lasix was increased to 40 mg daily. She states today that her lower extremity edema has resolved however she continues feeling short of breath on exertion denies orthopnea or paroxysmal nocturnal dyspnea. He says that she gets short of breath after only a few steps. She increased her home oxygen to 1.5 L a day. She is on inhaled steroids and albuterol as needed. She feels that her breathing has been worsening over the last few years. Denies any chest pain palpitations or syncope. She checks her weights at home and they have been stable around 118 - 119 pounds.  Wt Readings from Last 3 Encounters:  10/05/15 120 lb (54.432 kg)  09/30/15 122 lb (55.339 kg)  09/23/15 124 lb (56.246 kg)    Past Medical History  Diagnosis Date  . PVD (peripheral vascular disease) (HCC)   . Actinomycosis, cervicofacial   . Emphysema   . COPD (chronic obstructive pulmonary disease) (HCC)   . HTN (hypertension)   . Hx of echocardiogram     Echocardiogram 04/16/12: Mild focal basal septal hypertrophy, EF 70-75% with dynamic obstruction with mid cavity obliteration, grade 1 diastolic dysfunction, MAC, atrial septal thickening with findings consistent with lipomatous hypertrophy  . Hyperlipidemia   . CAD (coronary artery disease)     a. coronary calcification on prior Chest CT;  b. Dob MV 1/14:  EF 85%, no ischemia    Current Outpatient Prescriptions  Medication  Sig Dispense Refill  . albuterol (PROVENTIL HFA;VENTOLIN HFA) 108 (90 BASE) MCG/ACT inhaler Inhale 2 puffs into the lungs every 6 (six) hours as needed. For shortness of breath    . aspirin 81 MG tablet Take 81 mg by mouth daily.      Marland Kitchen atorvastatin (LIPITOR) 10 MG tablet Take 10 mg by mouth daily.      . budesonide-formoterol (SYMBICORT) 160-4.5 MCG/ACT inhaler  INHALE 2 PUFFS INTO THE LUNGS 2 (TWO) TIMES DAILY. 3 Inhaler 3  . clonazePAM (KLONOPIN) 0.5 MG tablet Take 0.5 tablets (0.25 mg total) by mouth 2 (two) times daily. 60 tablet 0  . famotidine (PEPCID) 20 MG tablet Take 1 tablet 2 times daily for 4 weeks then as needed 60 tablet 2  . furosemide (LASIX) 40 MG tablet Take 1 tablet (40 mg total) by mouth daily. 90 tablet 3  . Multiple Vitamin (MULTIVITAMIN) tablet Take 1 tablet by mouth daily.      . nebivolol (BYSTOLIC) 5 MG tablet Take 1 tablet (5 mg total) by mouth daily. 30 tablet   . niacin (NIASPAN) 1000 MG CR tablet Take 1,000 mg by mouth daily.    . potassium chloride SA (K-DUR,KLOR-CON) 20 MEQ tablet Take 2 tablets (40 mEq total) by mouth daily. 180 tablet 3  . Tiotropium Bromide Monohydrate (SPIRIVA RESPIMAT) 2.5 MCG/ACT AERS INHALE 2 PUFFS INTO THE LUNGS DAILY. 3 Inhaler 3  . UNABLE TO FIND Inhale 1.5-3 L into the lungs continuous.      No current facility-administered medications for this visit.   Allergies:    Allergies  Allergen Reactions  . Penicillins Hives    Has taken it since.  . Pneumococcal Vaccine Swelling    Severe localized swelling in arm  . Pneumovax [Pneumococcal Polysaccharide Vaccine] Hives   Social History:  The patient  reports that she quit smoking about 10 years ago. Her smoking use included Cigarettes. She has a 49 pack-year smoking history. She has never used smokeless tobacco. She reports that she does not drink alcohol or use illicit drugs.   Family history:   Family History  Problem Relation Age of Onset  . Coronary artery disease      family hx of female 1st. degree relative <50/family hx of 1st. degree relative <60  . Emphysema Father   . Heart disease Father   . Stomach cancer Father   . Emphysema      3 sisters. they were smokers  . Stomach cancer Sister   . Emphysema Sister   . Heart disease Mother   . Coronary artery disease Brother     ROS:  Please see the history of present illness.  All  other systems reviewed and negative.   PHYSICAL EXAM: VS:  BP 126/70 mmHg  Pulse 61  Ht  (1.549 m)  Wt 120 lb (54.432 kg)  BMI 22.69 kg/m2  SpO2 97% Well nourished, well developed, in no acute distress.  She is sitting comfortably in a wheel chair with O2 via [place] HEENT: Pupils are equal round react to light accommodation extraocular movements are intact.  Neck: no JVDNo cervical lymphadenopathy. Cardiac: Regular rate and rhythm without murmurs rubs or gallops. Lungs:  clear to auscultation bilaterally, no wheezing, rhonchi or rales Abd: soft, nontender, positive bowel sounds all quadrants,  Ext: no lower extremity edema.  2+ radial and dorsalis pedis pulses. Skin: warm and dry Neuro:  Grossly normal   ASSESSMENT AND PLAN:  Problem List Items Addressed This Visit  None    Visit Diagnoses    Chronic systolic heart failure (HCC)    -  Primary    Relevant Orders    Basic Metabolic Panel (BMET)    B Nat Peptide      TTE: 09/15/2015 Left ventricle: The cavity size was normal. There was mild focal  basal hypertrophy of the septum. Systolic function was vigorous.  The estimated ejection fraction was in the range of 65% to 70%.  Wall motion was normal; there were no regional wall motion  abnormalities. Doppler parameters are consistent with abnormal  left ventricular relaxation (grade 1 diastolic dysfunction).    Assessment and plan:   1. Worsening dyspnea on exertion - this is most probably multifactorial secondary to COPD on home O2 therapy and hypertrophic cardiomyopathy. She needs to be continued on a beta blockers- nebivolol. She had a negative stress test in August 2015. I believe that the elevated troponin during the last visit was secondary to demand ischemia in the settings of hypoxia associated with COPD exacerbation and pneumonia. The patient denies chest pain and her LVEF is vigorous without any wall motion abnormalities.  She was misinformed that her LVEF has  dropped, however I cannot find anywhere in her chart forwarded information comes from. We will schedule a follow-up appointment in 4 weeks to reevaluate her symptoms. I believe that her symptoms are related to worsening COPD, however if her symptoms continued to deteriorate or he don't improve we might consider left cardiac catheterization. He will be able to perform a stress test as she is not able to walk on a treadmill and recounted use of Lexiscan as a can Reese worsening COPD exacerbation. She wouldn't be a good candidate for coronary CT as she has significant calcification all 3 of her coronaries and her skin would be affected but a lot of artifacts.  2. Hypertension - controlled, continue the same regimen  3. COPD - on home O2 therapy. Followed by pulmonary.  4. Peripheral arterial disease - bluish discoloration of her lower extremities, her bilateral lower extremity arterial duplex was normal.   5. Coronary calcification on CT - Continue ASA, beta blocker and statin.  Lars Masson 10/05/2015

## 2015-10-09 DIAGNOSIS — Z9981 Dependence on supplemental oxygen: Secondary | ICD-10-CM | POA: Diagnosis not present

## 2015-10-09 DIAGNOSIS — I2510 Atherosclerotic heart disease of native coronary artery without angina pectoris: Secondary | ICD-10-CM | POA: Diagnosis not present

## 2015-10-09 DIAGNOSIS — J441 Chronic obstructive pulmonary disease with (acute) exacerbation: Secondary | ICD-10-CM | POA: Diagnosis not present

## 2015-10-09 DIAGNOSIS — I10 Essential (primary) hypertension: Secondary | ICD-10-CM | POA: Diagnosis not present

## 2015-10-13 NOTE — Assessment & Plan Note (Addendum)
Recent exacerbation -slowly resolving  Try very gentle diuresis .   Plan  Continue on Symbicort and Spiriva .  Take extra Lasix today .  follow up with Dr. Delton SeeNelson next week as planned.  Follow up with Dr. Delton CoombesByrum in 2 months and As needed   Please contact office for sooner follow up if symptoms do not improve or worsen or seek emergency care

## 2015-10-19 DIAGNOSIS — I2510 Atherosclerotic heart disease of native coronary artery without angina pectoris: Secondary | ICD-10-CM | POA: Diagnosis not present

## 2015-10-19 DIAGNOSIS — J441 Chronic obstructive pulmonary disease with (acute) exacerbation: Secondary | ICD-10-CM | POA: Diagnosis not present

## 2015-10-19 DIAGNOSIS — I10 Essential (primary) hypertension: Secondary | ICD-10-CM | POA: Diagnosis not present

## 2015-10-19 DIAGNOSIS — Z9981 Dependence on supplemental oxygen: Secondary | ICD-10-CM | POA: Diagnosis not present

## 2015-10-27 ENCOUNTER — Ambulatory Visit: Payer: Self-pay

## 2015-10-28 ENCOUNTER — Ambulatory Visit: Payer: Self-pay

## 2015-10-29 ENCOUNTER — Ambulatory Visit: Payer: Self-pay

## 2015-10-30 ENCOUNTER — Other Ambulatory Visit: Payer: Self-pay

## 2015-10-30 VITALS — BP 126/70 | Ht 61.0 in | Wt 119.0 lb

## 2015-10-30 DIAGNOSIS — J441 Chronic obstructive pulmonary disease with (acute) exacerbation: Secondary | ICD-10-CM

## 2015-10-30 NOTE — Patient Outreach (Addendum)
[facility] Care Management  10/30/2015  [name] 03-13-1935 [id number]    Screening    Referral Date:  10/21/2015 Source:  [place]  Issue: Disease and symptom management.  ED visits: 3 per insurance data.  Insurance:  HTA  Patient reached and verified PHI.  Service introduction provided and patient agreed to services and [name].   Providers: Primary MD: Dr. Alysia PennaScott Holwerda  -  last appt: fall 2016  / next appt:  May 2017 Cardiologist:  Lars MassonKatarina H Nelson, MD Pulmonologist:  yes  HH:  [facility] completed last week 10/2015. Insurance: HTA  Social: Patient lives in her home with her husband.   Mobility: Ambulates with walker; unable to complete cooking or housekeeping duties.  Falls: none  Pain: none Depression:  None.  Anxiety:  Yes but symptoms are managed.  Transportation: Husband  Caregiver: Husband Advance Directive: yes Resources: no needs at this time.  Consent:  yes DME: walker, W/C, Scooter, Electric W/C, home Oxygen 1L/m 24/7 ([facility]), BP cuff, home scales, reading glasses, bilateral hearing aids.  PMH:  HTN, CAD, PVD, hyperlipedemia, COPD, Emphysema, Anemia, Osteoporosis, Anxiety, Malnutrition of moderate degree, H/o PNA and acute CHF 09/2015.  Admissions: 1 ER visits: 3 per insurance data.  Last admission:  09/08/2015 - 09/15/2015  Acute on chronic respiratory failure with hypoxia and hypercarbia due to COPD exacerbation with pneumonia and was treated for acute diastolic CHF exacerbation  COPD Patient states symptoms are well managed at this time.  States she uses home oxygen at 1 L/m 24/7.  States she understands how to use her inhalers; keeps MD appt's and reports understanding all her medications.  Self checks BP readings 1 time a week.  Weight:  119 - 120 lbs  Medications:  Patient taking less 15 medications  Co-pay cost issues: none at this time but goes into the donut hole around May and states  she may need some assistance at that time.   Flu Vaccine:  04/11/15 Pneumonia Vaccine: 07/11/2009  Objective:   Encounter Medications:  Outpatient Encounter Prescriptions as of 10/30/2015  Medication Sig Note  . albuterol (PROVENTIL HFA;VENTOLIN HFA) 108 (90 BASE) MCG/ACT inhaler Inhale 2 puffs into the lungs every 6 (six) hours as needed. For shortness of breath   . aspirin 81 MG tablet Take 81 mg by mouth daily.     Marland Kitchen. atorvastatin (LIPITOR) 10 MG tablet Take 10 mg by mouth daily.     . budesonide-formoterol (SYMBICORT) 160-4.5 MCG/ACT inhaler INHALE 2 PUFFS INTO THE LUNGS 2 (TWO) TIMES DAILY.   . clonazePAM (KLONOPIN) 0.5 MG tablet Take 0.5 tablets (0.25 mg total) by mouth 2 (two) times daily.   . famotidine (PEPCID) 20 MG tablet Take 1 tablet 2 times daily for 4 weeks then as needed   . furosemide (LASIX) 40 MG tablet Take 1 tablet (40 mg total) by mouth daily.   . Multiple Vitamin (MULTIVITAMIN) tablet Take 1 tablet by mouth daily.     . nebivolol (BYSTOLIC) 5 MG tablet Take 1 tablet (5 mg total) by mouth daily.   . niacin (NIASPAN) 1000 MG CR tablet Take 1,000 mg by mouth daily.   . potassium chloride SA (K-DUR,KLOR-CON) 20 MEQ tablet Take 2 tablets (40 mEq total) by mouth daily.   . Tiotropium Bromide Monohydrate (SPIRIVA RESPIMAT) 2.5 MCG/ACT AERS INHALE 2 PUFFS INTO THE LUNGS DAILY.   Marland Kitchen. UNABLE TO FIND Inhale 1.5-3 L into the lungs continuous.  06/17/2015: Received  from: [facility] Received Sig:    No facility-administered encounter medications on file as of 10/30/2015.    Functional Status:  In your present state of health, do you have any difficulty performing the following activities: 10/30/2015 09/09/2015  Hearing? Y N  Vision? N N  Difficulty concentrating or making decisions? N N  Walking or climbing stairs? Y Y  Dressing or bathing? N Y  Doing errands, shopping? Y N  Preparing Food and eating ? Y -  Using the Toilet? N -  In the past six months, have  you accidently leaked urine? N -  Do you have problems with loss of bowel control? N -  Managing your Medications? N -  Managing your Finances? N -  Housekeeping or managing your Housekeeping? Y -    Fall/Depression Screening: PHQ 2/9 Scores 10/30/2015 01/14/2013  PHQ - 2 Score 0 2  PHQ- 9 Score - 3    Assessment:  Patient completed home health services last week.  Reports stable symptom management at this time.   Patient would benefit for Disease Management services.   Plan:  [facility] Health Coach Referral  -H/o hospital admission 09/08/2015 - 09/15/2015  Acute on chronic respiratory failure with hypoxia and hypercarbia due to COPD exacerbation No care coordination needs identified at this time, however, patient mentioned she goes into the [facility] around May and may need assistance at that time.    RN CM advised in next [facility] contact call within next 30 days for [facility] Health Coach Services for Disease Management Education.   RN CM advised to please notify MD of any changes in condition prior to scheduled appt's.   RN CM provided contact name and # [phone] or main office # [phone] and 24-hour nurse line # [phone].  RN CM confirmed patient is aware of 911 services for urgent emergency needs.  Donato Schultz, RN, BSN, [facility], CCM  [facility] [employer] Management Coordinator [phone] Direct [phone] Cell [phone] Office [phone] Fax

## 2015-11-02 ENCOUNTER — Encounter: Payer: Self-pay | Admitting: Physician Assistant

## 2015-11-02 ENCOUNTER — Ambulatory Visit (INDEPENDENT_AMBULATORY_CARE_PROVIDER_SITE_OTHER): Payer: PPO | Admitting: Physician Assistant

## 2015-11-02 VITALS — BP 130/62 | HR 73 | Ht 61.0 in | Wt 122.8 lb

## 2015-11-02 DIAGNOSIS — I429 Cardiomyopathy, unspecified: Secondary | ICD-10-CM

## 2015-11-02 DIAGNOSIS — I519 Heart disease, unspecified: Secondary | ICD-10-CM | POA: Diagnosis not present

## 2015-11-02 DIAGNOSIS — J438 Other emphysema: Secondary | ICD-10-CM | POA: Diagnosis not present

## 2015-11-02 DIAGNOSIS — I10 Essential (primary) hypertension: Secondary | ICD-10-CM

## 2015-11-02 DIAGNOSIS — I5189 Other ill-defined heart diseases: Secondary | ICD-10-CM | POA: Insufficient documentation

## 2015-11-02 MED ORDER — FUROSEMIDE 40 MG PO TABS: 40.0000 mg | ORAL_TABLET | Freq: Every day | ORAL | Status: DC

## 2015-11-02 MED ORDER — POTASSIUM CHLORIDE ER 10 MEQ PO TBCR: 40.0000 meq | EXTENDED_RELEASE_TABLET | Freq: Every day | ORAL | Status: DC

## 2015-11-02 MED ORDER — NEBIVOLOL HCL 5 MG PO TABS: 5.0000 mg | ORAL_TABLET | Freq: Every day | ORAL | Status: DC

## 2015-11-02 NOTE — Patient Instructions (Addendum)
Medication Instructions:   START TAKING 20 MEQ POTASSIUM  DAILY    If you need a refill on your cardiac medications before your next appointment, please call your pharmacy.  Labwork:  NONE ORDER TODAY    Testing/Procedures:  NONE ORDER TODAY    Follow-Up: IN 2 MONTHS WITH NELSON    Any Other Special Instructions Will Be Listed Below (If Applicable).

## 2015-11-02 NOTE — Progress Notes (Signed)
Cardiology Office Note    Date:  11/02/2015   ID:  [name], DOB [birth year], MRN [id number]  PCP:  Alysia PennaHOLWERDA, SCOTT, MD  Cardiologist:  Dr. Delton SeeNelson   Chief Complaint  Patient presents with  . Follow-up    very SOB with activity & Oxygen is set at 2, per pt    History of Present Illness:  [name] is a 81 y.o. female   a hx of PAD, HTN, COPD. She's had a question of infrarenal abdominal aorta dilatation on CT in the past. Subsequent ultrasounds have been negative for aneurysm. She last saw Dr. Daleen SquibbWall 8/12 after undergoing a "Life Screening Survey". This demonstrated mild carotid artery stenosis bilaterally (no percentage given) and mildly reduced ABIs bilaterally (0.9). Recommendation was to repeat her carotid Dopplers in 02/2013. Admitted 04/2012 with a COPD exacerbation. She was followed by pulmonary/critical care. Chest CT was negative for pulmonary embolism. She did have multiple lung nodules noted and needs followup CT in 3-6 months. Echocardiogram 04/16/12: Mild focal basal septal hypertrophy, EF 70-75% with dynamic obstruction with mid cavity obliteration, grade 1 diastolic dysfunction, MAC, atrial septal thickening with findings consistent with lipomatous hypertrophy.   Dr. Delton SeeNelson saw her 05/09/12 and titrated her beta blocker given her hypertrophic cardiomyopathy to slow her HR some. She remains on chronic O2. Chronic dyspnea.  She saw her again on 07/23/12 and she complained of chest pain. Of note, she had coronary calcifications on chest CT in 04/2012. Dobutamine Myoview 08/06/12: No ischemia, EF 85%.      The patient was hospitalized March 1 of 09/15/2015 for acute on chronic COPD exacerbation with pneumonia. With hypoxia and hypercarbia. Her troponin was elevated 0.61 with no acute EKG findings, she was treated for acute diastolic CHF exacerbation as well, weight at admission was 134 pounds and at discharge it was 125. BNP on 3/1 was 58.0.     She saw Dr. Delton SeeNelson 10/05/15 with  worsening dyspnea on exertion that was felt multifactorial secondary to COPD and hypertrophic cardiomyopathy. She recommended continuing beta blockers. She felt if her symptoms continued to deteriorate he might consider left heart catheterization.  Asian comes in today walking with a walker which is a big improvement for her. She had been using a wheelchair. Some days are better than others but she is able to shop walking with a walker most days. When the humidity is high it's not as easy for her. Her weight has been stable at 118- 119 pounds and she is asking if she needs to continue Lasix and potassium.    Past Medical History  Diagnosis Date  . PVD (peripheral vascular disease) (HCC)   . Actinomycosis, cervicofacial   . Emphysema   . COPD (chronic obstructive pulmonary disease) (HCC)   . HTN (hypertension)   . Hx of echocardiogram     Echocardiogram 04/16/12: Mild focal basal septal hypertrophy, EF 70-75% with dynamic obstruction with mid cavity obliteration, grade 1 diastolic dysfunction, MAC, atrial septal thickening with findings consistent with lipomatous hypertrophy  . Hyperlipidemia   . CAD (coronary artery disease)     a. coronary calcification on prior Chest CT;  b. Dob MV 1/14:  EF 85%, no ischemia    Past Surgical History  Procedure Laterality Date  . Appendectomy    . Tonsillectomy    . Bone graft hip iliac crest      Current Medications: Outpatient Prescriptions Prior to Visit  Medication Sig Dispense Refill  . albuterol (PROVENTIL HFA;VENTOLIN HFA)  108 (90 BASE) MCG/ACT inhaler Inhale 2 puffs into the lungs every 6 (six) hours as needed. For shortness of breath    . aspirin 81 MG tablet Take 81 mg by mouth daily.      Marland Kitchen atorvastatin (LIPITOR) 10 MG tablet Take 10 mg by mouth daily.      . budesonide-formoterol (SYMBICORT) 160-4.5 MCG/ACT inhaler INHALE 2 PUFFS INTO THE LUNGS 2 (TWO) TIMES DAILY. 3 Inhaler 3  . clonazePAM (KLONOPIN) 0.5 MG tablet Take 0.5 tablets (0.25  mg total) by mouth 2 (two) times daily. 60 tablet 0  . Multiple Vitamin (MULTIVITAMIN) tablet Take 1 tablet by mouth daily.      . niacin (NIASPAN) 1000 MG CR tablet Take 1,000 mg by mouth daily.    . Tiotropium Bromide Monohydrate (SPIRIVA RESPIMAT) 2.5 MCG/ACT AERS INHALE 2 PUFFS INTO THE LUNGS DAILY. 3 Inhaler 3  . UNABLE TO FIND Inhale 1.5-3 L into the lungs continuous.     . furosemide (LASIX) 40 MG tablet Take 1 tablet (40 mg total) by mouth daily. 90 tablet 3  . nebivolol (BYSTOLIC) 5 MG tablet Take 1 tablet (5 mg total) by mouth daily. 30 tablet   . potassium chloride SA (K-DUR,KLOR-CON) 20 MEQ tablet Take 2 tablets (40 mEq total) by mouth daily. 180 tablet 3  . famotidine (PEPCID) 20 MG tablet Take 1 tablet 2 times daily for 4 weeks then as needed 60 tablet 2   No facility-administered medications prior to visit.     Allergies:   Penicillins; Pneumococcal vaccine; and Pneumovax   Social History   Social History  . Marital Status: Married    Spouse Name: N/A  . Number of Children: 4  . Years of Education: N/A   Occupational History  . retired     [employer]   Social History Main Topics  . Smoking status: Former Smoker -- 1.00 packs/day for 49 years    Types: Cigarettes    Quit date: 07/11/2005  . Smokeless tobacco: Never Used     Comment: smoked for 48 years 1 ppd  . Alcohol Use: No  . Drug Use: No  . Sexual Activity: Not Asked   Other Topics Concern  . None   Social History Narrative     Family History:  The patient's    family history includes Coronary artery disease in her brother; Emphysema in her father and sister; Heart disease in her father and mother; Stomach cancer in her father and sister.   ROS:   Please see the history of present illness.    Review of Systems  Constitution: Negative.  HENT: Negative.   Cardiovascular: Negative.   Respiratory: Positive for shortness of breath.   Hematologic/Lymphatic: Bruises/bleeds easily.  Musculoskeletal:  Positive for joint swelling. Negative for joint pain.  Gastrointestinal: Negative.   Genitourinary: Negative.   Neurological: Negative.    All other systems reviewed and are negative.   PHYSICAL EXAM:   VS:  BP 130/62 mmHg  Pulse 73  Ht  (1.549 m)  Wt 122 lb 12.8 oz (55.702 kg)  BMI 23.21 kg/m2  SpO2 92%   GEN: Well nourished, well developed, in no acute distress Neck: no JVD, carotid bruits, or masses Cardiac:  RRR; distant heart sounds no murmurs, rubs, or gallops,no edema  Respiratory:  Decreased breath sounds throughout but clear to auscultation bilaterally, wearing oxygen GI: soft, nontender, nondistended, + BS MS: no deformity or atrophy Skin: Some cyanosis of the toes warm and dry, no  rash Neuro:  Alert and Oriented x 3, Strength and sensation are intact Psych: euthymic mood, full affect  Wt Readings from Last 3 Encounters:  11/02/15 122 lb 12.8 oz (55.702 kg)  10/30/15 119 lb (53.978 kg)  10/05/15 120 lb (54.432 kg)      Studies/Labs Reviewed:   EKG:  EKG is not ordered today.   Recent Labs: 09/09/2015: B Natriuretic Peptide 58.0 09/12/2015: Hemoglobin 11.3*; Platelets 308 09/13/2015: ALT 26; TSH 0.603 09/15/2015: Magnesium 1.9 10/05/2015: BUN 19; Creat 0.69; Potassium 5.0; Sodium 138   Lipid Panel    Component Value Date/Time   CHOL 213 12/16/2008   TRIG 166 12/16/2008   HDL 62 12/16/2008   LDLCALC 116 12/16/2008    Additional studies/ records that were reviewed today include:   TTE: 09/15/2015 Left ventricle: The cavity size was normal. There was mild focal   basal hypertrophy of the septum. Systolic function was vigorous.   The estimated ejection fraction was in the range of 65% to 70%.   Wall motion was normal; there were no regional wall motion   abnormalities. Doppler parameters are consistent with abnormal   left ventricular relaxation (grade 1 diastolic dysfunction).     ASSESSMENT:    1. Diastolic dysfunction   2. EMPHYSEMA NEC   3.  Cardiomyopathy (HCC)   4. Essential hypertension      PLAN:  In order of problems listed above:  Patient has diastolic heart failure on recent 2-D echo. Weight in the hospital was 134 but she is maintaining at 119 pounds. Would continue daily Lasix 40 mg. Last potassium check was 5.0 so reduce K door to 10 mEq 2 daily. 2 g sodium diet. Follow-up with Dr. Delton See in 2 months  Chronic dyspnea on exertion which is multifactorial with COPD on home oxygen and hypertrophic cardiomyopathy. She is doing much better walking with a walker and not using the wheelchair as much.  Hypertrophic cardiomyopathy continue diastolic gave a bottle of samples but will call in a prescription today  Blood pressure controlled    Medication Adjustments/Labs and Tests Ordered: Current medicines are reviewed at length with the patient today.  Concerns regarding medicines are outlined above.  Medication changes, Labs and Tests ordered today are listed in the Patient Instructions below. Patient Instructions  Medication Instructions:   START TAKING 20 MEQ POTASSIUM  DAILY    If you need a refill on your cardiac medications before your next appointment, please call your pharmacy.  Labwork:  NONE ORDER TODAY    Testing/Procedures:  NONE ORDER TODAY    Follow-Up: IN 2 MONTHS WITH NELSON    Any Other Special Instructions Will Be Listed Below (If Applicable).  Elson Clan, PA-C  11/02/2015 11:46 AM    [facility] [address] [place], [place], [place]  [address] Phone: [phone]; Fax: [phone]

## 2015-11-04 ENCOUNTER — Ambulatory Visit: Payer: PPO | Admitting: Cardiology

## 2015-11-11 ENCOUNTER — Other Ambulatory Visit: Payer: Self-pay

## 2015-11-11 NOTE — Patient Outreach (Signed)
[facility] Care Management  [facility] Care Manager  11/11/2015   [name] [birth year] [id number]  Subjective: Telephone call to patient for initial assessment.  Patient reports being as healthy as she can at this time.  Patient reports she utilizes rest and breathing to help with increased shortness of breath episodes.  Husband really helps patient when she is unable to do things.  No problems with pain. Patient admits to hypertension but is controlled with medications.  Discussed with patient COPD action plan and when to notify physician.  Also discussed pursed lip breathing with patient.  She verbalized understanding.    Objective:   Encounter Medications:  Outpatient Encounter Prescriptions as of 11/11/2015  Medication Sig Note  . albuterol (PROVENTIL HFA;VENTOLIN HFA) 108 (90 BASE) MCG/ACT inhaler Inhale 2 puffs into the lungs every 6 (six) hours as needed. For shortness of breath   . aspirin 81 MG tablet Take 81 mg by mouth daily.     Marland Kitchen atorvastatin (LIPITOR) 10 MG tablet Take 10 mg by mouth daily.     . budesonide-formoterol (SYMBICORT) 160-4.5 MCG/ACT inhaler INHALE 2 PUFFS INTO THE LUNGS 2 (TWO) TIMES DAILY.   . Calcium Carb-Cholecalciferol (CALCIUM 600 + D PO) Take 1 tablet by mouth daily.   . clonazePAM (KLONOPIN) 0.5 MG tablet Take 0.5 tablets (0.25 mg total) by mouth 2 (two) times daily.   . furosemide (LASIX) 40 MG tablet Take 1 tablet (40 mg total) by mouth daily.   . Magnesium 500 MG CAPS Take 1 capsule by mouth daily.   . Multiple Vitamin (MULTIVITAMIN) tablet Take 1 tablet by mouth daily.     . nebivolol (BYSTOLIC) 5 MG tablet Take 1 tablet (5 mg total) by mouth daily.   . niacin (NIASPAN) 1000 MG CR tablet Take 1,000 mg by mouth daily.   . potassium chloride (K-DUR) 10 MEQ tablet Take 4 tablets (40 mEq total) by mouth daily. 11/11/2015: Taking 20 meq daily  . Tiotropium Bromide Monohydrate (SPIRIVA RESPIMAT) 2.5 MCG/ACT AERS INHALE 2 PUFFS INTO THE LUNGS  DAILY.   Marland Kitchen UNABLE TO FIND Inhale 1.5-3 L into the lungs continuous.  06/17/2015: Received from: [facility] Received Sig:    No facility-administered encounter medications on file as of 11/11/2015.    Functional Status:  In your present state of health, do you have any difficulty performing the following activities: 11/11/2015 10/30/2015  Hearing? [name]  Vision? N N  Difficulty concentrating or making decisions? N N  Walking or climbing stairs? Y Y  Dressing or bathing? N N  Doing errands, shopping? [name]  Preparing Food and eating ? Y Y  Using the Toilet? N N  In the past six months, have you accidently leaked urine? N N  Do you have problems with loss of bowel control? N N  Managing your Medications? N N  Managing your Finances? N N  Housekeeping or managing your Housekeeping? [name]    Fall/Depression Screening: PHQ 2/9 Scores 11/11/2015 10/30/2015 01/14/2013  PHQ - 2 Score 0 0 2  PHQ- 9 Score - - 3    Assessment: Patient will benefit from health coach outreach for disease management and support of COPD.    Plan:  [facility] CM Care Plan Problem One        Most Recent Value   Care Plan Problem One  COPD knowledge deficit   Role Documenting the Problem One  Health Coach   Care Plan for Problem One  Active   [facility] Long Term Goal (31-90 days)  Patient will be able to explain COPD zones on action plan within the next 90 days    [facility] Long Term Goal Start Date  11/11/15   Interventions for Problem One Long Term Goal  Health Coach to send COPD information to patient that includes action plan.    [facility] CM Short Term Goal #1 (0-30 days)  Patient will be able to describe COPD green zone within 30 days.     [facility] CM Short Term Goal #1 Start Date  11/11/15   Interventions for Short Term Goal #1  Discussed with patient green zone on action plan.  Action plan sent to patient    [facility] CM Short Term Goal #2 (0-30 days)  Patient will be able to describe techniques of controlling breath during  episodes of shortness breath within 30 days.    [facility] CM Short Term Goal #2 Start Date  11/11/15   Interventions for Short Term Goal #2  Discussed with patient pursed lipped breath. Discussed use of rescue inhaler.  Health Coach encouraged patient to use frequent rest periods to conserve energy.       RN Health Coach will provide ongoing education for patient on COPD through phone calls and sending printed information to patient for further discussion.  RN Health Coach will send welcome packet with consent to patient as well as printed information on COPD. RN Health Coach will send initial barriers letter, assessment, and care plan to primary care physician.  RN Health Coach will contact patient within one month and patient agrees to next contact.   Bary Lericheionne J Prosper Paff, RN, MSN [facility] Care Management RN Telephonic Health Coach [phone]

## 2015-11-13 ENCOUNTER — Encounter: Payer: Self-pay | Admitting: Cardiology

## 2015-11-24 ENCOUNTER — Encounter: Payer: Self-pay | Admitting: Cardiology

## 2015-11-24 DIAGNOSIS — I10 Essential (primary) hypertension: Secondary | ICD-10-CM | POA: Diagnosis not present

## 2015-11-25 ENCOUNTER — Encounter: Payer: Self-pay | Admitting: Cardiology

## 2015-12-02 DIAGNOSIS — Z1389 Encounter for screening for other disorder: Secondary | ICD-10-CM | POA: Diagnosis not present

## 2015-12-02 DIAGNOSIS — I422 Other hypertrophic cardiomyopathy: Secondary | ICD-10-CM | POA: Diagnosis not present

## 2015-12-02 DIAGNOSIS — Z6822 Body mass index (BMI) 22.0-22.9, adult: Secondary | ICD-10-CM | POA: Diagnosis not present

## 2015-12-02 DIAGNOSIS — Z9981 Dependence on supplemental oxygen: Secondary | ICD-10-CM | POA: Diagnosis not present

## 2015-12-02 DIAGNOSIS — E7870 Disorder of bile acid and cholesterol metabolism, unspecified: Secondary | ICD-10-CM | POA: Diagnosis not present

## 2015-12-02 DIAGNOSIS — I509 Heart failure, unspecified: Secondary | ICD-10-CM | POA: Diagnosis not present

## 2015-12-02 DIAGNOSIS — Z0000 Encounter for general adult medical examination without abnormal findings: Secondary | ICD-10-CM | POA: Diagnosis not present

## 2015-12-02 DIAGNOSIS — J449 Chronic obstructive pulmonary disease, unspecified: Secondary | ICD-10-CM | POA: Diagnosis not present

## 2015-12-02 DIAGNOSIS — I10 Essential (primary) hypertension: Secondary | ICD-10-CM | POA: Diagnosis not present

## 2015-12-07 ENCOUNTER — Emergency Department (HOSPITAL_COMMUNITY): Payer: PPO

## 2015-12-07 ENCOUNTER — Encounter (HOSPITAL_COMMUNITY): Payer: Self-pay

## 2015-12-07 ENCOUNTER — Emergency Department (HOSPITAL_COMMUNITY)
Admission: EM | Admit: 2015-12-07 | Discharge: 2015-12-07 | Disposition: A | Discharge status: Home / Self Care | Payer: PPO | Attending: Emergency Medicine | Admitting: Emergency Medicine

## 2015-12-07 DIAGNOSIS — Z87891 Personal history of nicotine dependence: Secondary | ICD-10-CM | POA: Diagnosis not present

## 2015-12-07 DIAGNOSIS — I10 Essential (primary) hypertension: Secondary | ICD-10-CM | POA: Insufficient documentation

## 2015-12-07 DIAGNOSIS — J439 Emphysema, unspecified: Secondary | ICD-10-CM | POA: Diagnosis not present

## 2015-12-07 DIAGNOSIS — Z79899 Other long term (current) drug therapy: Secondary | ICD-10-CM | POA: Diagnosis not present

## 2015-12-07 DIAGNOSIS — E785 Hyperlipidemia, unspecified: Secondary | ICD-10-CM | POA: Diagnosis not present

## 2015-12-07 DIAGNOSIS — Z880 Allergy status to penicillin: Secondary | ICD-10-CM | POA: Diagnosis not present

## 2015-12-07 DIAGNOSIS — Z7951 Long term (current) use of inhaled steroids: Secondary | ICD-10-CM | POA: Insufficient documentation

## 2015-12-07 DIAGNOSIS — Z9981 Dependence on supplemental oxygen: Secondary | ICD-10-CM | POA: Diagnosis not present

## 2015-12-07 DIAGNOSIS — Z8619 Personal history of other infectious and parasitic diseases: Secondary | ICD-10-CM | POA: Diagnosis not present

## 2015-12-07 DIAGNOSIS — R079 Chest pain, unspecified: Secondary | ICD-10-CM | POA: Diagnosis not present

## 2015-12-07 DIAGNOSIS — Z7982 Long term (current) use of aspirin: Secondary | ICD-10-CM | POA: Insufficient documentation

## 2015-12-07 DIAGNOSIS — R030 Elevated blood-pressure reading, without diagnosis of hypertension: Secondary | ICD-10-CM | POA: Diagnosis not present

## 2015-12-07 DIAGNOSIS — I2510 Atherosclerotic heart disease of native coronary artery without angina pectoris: Secondary | ICD-10-CM | POA: Insufficient documentation

## 2015-12-07 LAB — CBC
HCT: 42.9 % (ref 36.0–46.0)
Hemoglobin: 13.8 g/dL (ref 12.0–15.0)
MCH: 30.3 pg (ref 26.0–34.0)
MCHC: 32.2 g/dL (ref 30.0–36.0)
MCV: 94.1 fL (ref 78.0–100.0)
PLATELETS: 319 10*3/uL (ref 150–400)
RBC: 4.56 MIL/uL (ref 3.87–5.11)
RDW: 12.6 % (ref 11.5–15.5)
WBC: 8 10*3/uL (ref 4.0–10.5)

## 2015-12-07 LAB — BASIC METABOLIC PANEL
Anion gap: 6 (ref 5–15)
BUN: 11 mg/dL (ref 6–20)
CO2: 30 mmol/L (ref 22–32)
CREATININE: 0.68 mg/dL (ref 0.44–1.00)
Calcium: 9.6 mg/dL (ref 8.9–10.3)
Chloride: 104 mmol/L (ref 101–111)
GFR calc Af Amer: >60 mL/min (ref >60)
GLUCOSE: 127 mg/dL — AB (ref 65–99)
POTASSIUM: 4.1 mmol/L (ref 3.5–5.1)
Sodium: 140 mmol/L (ref 135–145)

## 2015-12-07 LAB — I-STAT TROPONIN, ED: Troponin i, poc: 0.01 ng/mL (ref 0.00–0.08)

## 2015-12-07 MED ORDER — FUROSEMIDE 20 MG PO TABS
40.0000 mg | ORAL_TABLET | Freq: Once | ORAL | Status: AC
Start: 2015-12-07 — End: 2015-12-07
  Administered 2015-12-07: 40 mg via ORAL
  Filled 2015-12-07: qty 2

## 2015-12-07 NOTE — ED Provider Notes (Signed)
CSN: [id number]     Arrival date & time 12/07/15  [id number] History   First MD Initiated Contact with Patient 12/07/15 [phone]     Chief Complaint  Patient presents with  . Hypertension   (Consider location/radiation/quality/duration/timing/severity/associated sxs/prior Treatment) HPI 81 y.o. female with a hx of COPD, HTN, CAD, presents to the Emergency Department today complaining of high blood pressure that she noticed this morning. States that she needed to be evaluated if her blood pressure was 160/100. States that this morning her BP was 174/100 after taking her Nebivolol. Notes no N/V/D. No Diaphoresis. Noted Chest discomfort this morning around 5am that lasted <1 min. Central with no radiation. Notes similar episodes in the past. No hx MI. No CP/SOB currently. No headaches. No vision changes. No fevers. Pt is on chronic O2 on 1L due to COPD. No other symptoms noted.    Past Medical History  Diagnosis Date  . PVD (peripheral vascular disease) (HCC)   . Actinomycosis, cervicofacial   . Emphysema   . COPD (chronic obstructive pulmonary disease) (HCC)   . HTN (hypertension)   . Hx of echocardiogram     Echocardiogram 04/16/12: Mild focal basal septal hypertrophy, EF 70-75% with dynamic obstruction with mid cavity obliteration, grade 1 diastolic dysfunction, MAC, atrial septal thickening with findings consistent with lipomatous hypertrophy  . Hyperlipidemia   . CAD (coronary artery disease)     a. coronary calcification on prior Chest CT;  b. Dob MV 1/14:  EF 85%, no ischemia   Past Surgical History  Procedure Laterality Date  . Appendectomy    . Tonsillectomy    . Bone graft hip iliac crest     Family History  Problem Relation Age of Onset  . Coronary artery disease      family hx of female 1st. degree relative <50/family hx of 1st. degree relative <60  . Emphysema Father   . Heart disease Father   . Stomach cancer Father   . Emphysema      3 sisters. they were smokers  . Stomach  cancer Sister   . Emphysema Sister   . Heart disease Mother   . Coronary artery disease Brother    Social History  Substance Use Topics  . Smoking status: Former Smoker -- 1.00 packs/day for 49 years    Types: Cigarettes    Quit date: 07/11/2005  . Smokeless tobacco: Never Used     Comment: smoked for 48 years 1 ppd  . Alcohol Use: No   OB History    No data available     Review of Systems ROS reviewed and all are negative for acute change except as noted in the HPI.  Allergies  Penicillins; Pneumococcal vaccine; and Pneumovax  Home Medications   Prior to Admission medications   Medication Sig Start Date End Date Taking? Authorizing Provider  albuterol (PROVENTIL HFA;VENTOLIN HFA) 108 (90 BASE) MCG/ACT inhaler Inhale 2 puffs into the lungs every 6 (six) hours as needed. For shortness of breath    Historical Provider, MD  aspirin 81 MG tablet Take 81 mg by mouth daily.      Historical Provider, MD  atorvastatin (LIPITOR) 10 MG tablet Take 10 mg by mouth daily.      Historical Provider, MD  budesonide-formoterol (SYMBICORT) 160-4.5 MCG/ACT inhaler INHALE 2 PUFFS INTO THE LUNGS 2 (TWO) TIMES DAILY. 08/28/15   Leslye Peerobert S Byrum, MD  Calcium Carb-Cholecalciferol (CALCIUM 600 + D PO) Take 1 tablet by mouth daily.    Historical  Provider, MD  clonazePAM (KLONOPIN) 0.5 MG tablet Take 0.5 tablets (0.25 mg total) by mouth 2 (two) times daily. 09/15/15   Drema Dallas, MD  furosemide (LASIX) 40 MG tablet Take 1 tablet (40 mg total) by mouth daily. 11/02/15   Dyann Kief, PA-C  Magnesium 500 MG CAPS Take 1 capsule by mouth daily.    Historical Provider, MD  Multiple Vitamin (MULTIVITAMIN) tablet Take 1 tablet by mouth daily.      Historical Provider, MD  nebivolol (BYSTOLIC) 5 MG tablet Take 1 tablet (5 mg total) by mouth daily. 11/02/15   Dyann Kief, PA-C  niacin (NIASPAN) 1000 MG CR tablet Take 1,000 mg by mouth daily.    Historical Provider, MD  potassium chloride (K-DUR) 10 MEQ tablet  Take 4 tablets (40 mEq total) by mouth daily. 11/02/15   Dyann Kief, PA-C  Tiotropium Bromide Monohydrate (SPIRIVA RESPIMAT) 2.5 MCG/ACT AERS INHALE 2 PUFFS INTO THE LUNGS DAILY. 08/28/15   Leslye Peer, MD  UNABLE TO FIND Inhale 1.5-3 L into the lungs continuous.     Historical Provider, MD   BP 161/96 mmHg  Pulse 93  Temp(Src) 98 F (36.7 C) (Oral)  Resp 20  SpO2 97%   Physical Exam  Constitutional: She is oriented to person, place, and time. She appears well-developed and well-nourished.  HENT:  Head: Normocephalic and atraumatic.  Eyes: EOM are normal. Pupils are equal, round, and reactive to light.  Neck: Normal range of motion. Neck supple.  Cardiovascular: Normal rate, regular rhythm, normal heart sounds and intact distal pulses.   No murmur heard. Pulmonary/Chest: Effort normal and breath sounds normal. No respiratory distress. She has no wheezes. She has no rales. She exhibits no tenderness.  Abdominal: Soft. Bowel sounds are normal. There is no tenderness.  Musculoskeletal: Normal range of motion.  Neurological: She is alert and oriented to person, place, and time. She has normal strength. No cranial nerve deficit or sensory deficit.  Cranial Nerves:  II: Pupils equal, round, reactive to light III,IV, VI: ptosis not present, extra-ocular motions intact bilaterally  V,VII: smile symmetric, facial light touch sensation equal VIII: hearing grossly normal bilaterally  IX,X: midline uvula rise  XI: bilateral shoulder shrug equal and strong XII: midline tongue extension  Skin: Skin is warm and dry.  Psychiatric: She has a normal mood and affect. Her behavior is normal. Thought content normal.  Nursing note and vitals reviewed.  ED Course  Procedures (including critical care time) Labs Review Labs Reviewed  BASIC METABOLIC PANEL - Abnormal; Notable for the following:    Glucose, Bld 127 (*)    All other components within normal limits  CBC  I-STAT TROPOININ, ED    Imaging Review Dg Chest 2 View  12/07/2015  CLINICAL DATA:  Chest pain EXAM: CHEST  2 VIEW COMPARISON:  09/14/2015 chest radiograph. FINDINGS: Stable cardiomediastinal silhouette with normal heart size. No pneumothorax. No pleural effusion. Emphysema and hyperinflated lungs. No acute consolidative airspace disease. Two small nodular opacities in the right mid lung are not appreciably changed back to the 11/26/2014 chest CT. Irregular linear opacity in the lateral apical right upper lung does not definitely changed compared to the scout tomogram from the 11/26/2014 chest CT. IMPRESSION: 1. No acute cardiopulmonary disease. 2. Irregular linear opacity in the lateral apical right upper lung, not definitely changed since 11/26/2014 chest CT. The patient is due for a follow-up chest CT for this opacity as recommended on 11/26/2014 chest CT report. 3.  Two small pulmonary nodules in the right mid lung, not appreciably changed back to 11/26/2014 chest CT. Electronically Signed   By: Delbert Phenix M.D.   On: 12/07/2015 10:10   I have personally reviewed and evaluated these images and lab results as part of my medical decision-making.   EKG Interpretation None      MDM  I have reviewed and evaluated the relevant laboratory values I have reviewed and evaluated the relevant imaging studies.  I have interpreted the relevant EKG. I have reviewed the relevant previous healthcare records. I have reviewed EMS Documentation. I obtained HPI from historian. Patient discussed with supervising physician  ED Course:  Assessment: Pt is a 80yF with hx COPD, HTN, CAD who presents due to concern for high BP this morning. Told to be evaluated if BP >160/100. No CP/SOB, Diaphoresis, N/V in ED. Noted some chest discomfort earlier around 5am that last <53min centrally with no radiation at rest. On exam, pt in NAD. Nontoxic/nonseptic appearing. VSS. Afebrile. Lungs CTA. Heart RRR. Abdomen nontender soft. iStat Trop negative.  Labs unremarkable. CXR negative. EKG without acute abnormalities. Pt took Nebivolol this morning. Given home lasix dose in ED. BP improved to 114/62 with home medication. Discussed patient with attending provider. Plan is to DC Home with follow up to PCP. Informed patient to follow up CT for pulmonary nodules that are unchanged. At time of discharge, Patient is in no acute distress. Vital Signs are stable. Patient is able to ambulate. Patient able to tolerate PO.    Disposition/Plan:  DC home Additional Verbal discharge instructions given and discussed with patient.  Pt Instructed to f/u with PCP in the next week for evaluation and treatment of symptoms. Return precautions given Pt acknowledges and agrees with plan  Supervising Physician Leta Baptist, MD   Final diagnoses:  Essential hypertension     Audry Pili, PA-C 12/07/15 1110  Leta Baptist, MD 12/12/15 [phone]

## 2015-12-07 NOTE — ED Notes (Signed)
[facility]- pt coming from home with c/o hypertension X2-3 days. She reports BP improves with taking her BP medications. Pt has hx of anxiety.

## 2015-12-07 NOTE — Discharge Instructions (Signed)
Please read and follow all provided instructions.  Your diagnoses today include:  1. Essential hypertension    Tests performed today include:  Vital signs. See below for your results today.   Medications prescribed:   Take as prescribed   Home care instructions:  Follow any educational materials contained in this packet.  Follow-up instructions: Please follow-up with your primary care provider for further evaluation of symptoms and treatment   Return instructions:   Please return to the Emergency Department if you do not get better, if you get worse, or new symptoms OR  - Fever (temperature greater than 101.21F)  - Bleeding that does not stop with holding pressure to the area    -Severe pain (please note that you may be more sore the day after your accident)  - Chest Pain  - Difficulty breathing  - Severe nausea or vomiting  - Inability to tolerate food and liquids  - Passing out  - Skin becoming red around your wounds  - Change in mental status (confusion or lethargy)  - New numbness or weakness     Please return if you have any other emergent concerns.  Additional Information:  Your vital signs today were: BP 114/62 mmHg   Pulse 84   Temp(Src) 98 F (36.7 C) (Oral)   Resp 20   SpO2 98% If your blood pressure (BP) was elevated above 135/85 this visit, please have this repeated by your doctor within one month. ---------------

## 2015-12-08 ENCOUNTER — Other Ambulatory Visit: Payer: Self-pay

## 2015-12-08 NOTE — Patient Outreach (Signed)
[facility]) Care Management  12/08/2015  Terald SleeperHazel H Farina 1934/07/26 [id number]   Nurse line report received, notification sent to Fleeta Emmerionne Leath, RN.  Thanks, Corrie MckusickLisa O. Sharlee BlewMoore, [facility] CM Assistant Phone: [phone] Fax: [phone]

## 2015-12-08 NOTE — Patient Outreach (Signed)
[facility]) Care Management  [facility]  12/08/2015   Terald SleeperHazel H Rouillard [birth year] [id number]  Subjective: Telephone call to patient after going to the emergency room for increased blood pressure.  Patient reports she is not sure what happened but for two days her blood pressure was elevated and she did not feel good. Patient reports at the emergency room they could not figure out why her pressure as high but since the visit her blood pressure has been good.  She reports today's reading was 120/81.  Discussed with patient the importance  of following up with her primary care doctor as well.  She verbalized understanding.  Also discussed with patient COPD and COPD action plan she received. She verbalize understanding.    Objective:   Encounter Medications:  Outpatient Encounter Prescriptions as of 12/08/2015  Medication Sig Note  . albuterol (PROVENTIL HFA;VENTOLIN HFA) 108 (90 BASE) MCG/ACT inhaler Inhale 2 puffs into the lungs every 6 (six) hours as needed. For shortness of breath   . aspirin 81 MG tablet Take 81 mg by mouth daily.     Marland Kitchen. aspirin-acetaminophen-caffeine (EXCEDRIN MIGRAINE) 250-250-65 MG tablet Take 1 tablet by mouth every 6 (six) hours as needed for headache.   Marland Kitchen. atorvastatin (LIPITOR) 10 MG tablet Take 10 mg by mouth daily.     . budesonide-formoterol (SYMBICORT) 160-4.5 MCG/ACT inhaler INHALE 2 PUFFS INTO THE LUNGS 2 (TWO) TIMES DAILY.   . Calcium Carb-Cholecalciferol (CALCIUM 600 + D PO) Take 1 tablet by mouth daily.   . clonazePAM (KLONOPIN) 0.5 MG tablet Take 0.5 tablets (0.25 mg total) by mouth 2 (two) times daily.   . furosemide (LASIX) 40 MG tablet Take 1 tablet (40 mg total) by mouth daily.   . Magnesium 500 MG CAPS Take 1 capsule by mouth daily.   . Multiple Vitamin (MULTIVITAMIN) tablet Take 1 tablet by mouth daily.     . nebivolol (BYSTOLIC) 5 MG tablet Take 1 tablet (5 mg total) by mouth daily.   . niacin (NIASPAN) 1000 MG CR tablet Take  1,000 mg by mouth daily.   . potassium chloride (K-DUR) 10 MEQ tablet Take 4 tablets (40 mEq total) by mouth daily. (Patient taking differently: Take 20 mEq by mouth daily. ) 11/11/2015: Taking 20 meq daily  . Tiotropium Bromide Monohydrate (SPIRIVA RESPIMAT) 2.5 MCG/ACT AERS INHALE 2 PUFFS INTO THE LUNGS DAILY.   Marland Kitchen. UNABLE TO FIND Inhale 1.5-3 L into the lungs continuous.  06/17/2015: [facility] Received Sig:    No facility-administered encounter medications on file as of 12/08/2015.    Functional Status:  In your present state of health, do you have any difficulty performing the following activities: 11/11/2015 10/30/2015  Hearing? Malvin JohnsY Y  Vision? N N  Difficulty concentrating or making decisions? N N  Walking or climbing stairs? Y Y  Dressing or bathing? N N  Doing errands, shopping? Malvin JohnsY Y  Preparing Food and eating ? Y Y  Using the Toilet? N N  In the past six months, have you accidently leaked urine? N N  Do you have problems with loss of bowel control? N N  Managing your Medications? N N  Managing your Finances? N N  Housekeeping or managing your Housekeeping? Malvin JohnsY Y    Fall/Depression Screening: PHQ 2/9 Scores 12/08/2015 11/11/2015 10/30/2015 01/14/2013  PHQ - 2 Score 0 0 0 2  PHQ- 9 Score - - - 3    Assessment: Patient continues to benefit from  health coach outreach for disease management and support.    Plan:  [facility] CM Care Plan Problem One        Most Recent Value   Care Plan Problem One  COPD knowledge deficit   Role Documenting the Problem One  Health Coach   Care Plan for Problem One  Active   [facility] Long Term Goal (31-90 days)  Patient will be able to explain COPD zones on action plan within the next 90 days    [facility] Long Term Goal Start Date  11/11/15   Interventions for Problem One Long Term Goal  RN Health Coach reviewed with patient COPD action plan and purpose.     [facility] CM Short Term Goal #1 (0-30 days)  Patient will be able to describe COPD green  zone within 30 days.     [facility] CM Short Term Goal #1 Start Date  12/08/15 [goal continued]   Interventions for Short Term Goal #1  RN Health Coach reviewed with patient COPD green zone on action plan.     [facility] CM Short Term Goal #2 (0-30 days)  Patient will be able to describe techniques of controlling breath during episodes of shortness breath within 30 days.    [facility] CM Short Term Goal #2 Start Date  12/08/15 [goal continued]   Interventions for Short Term Goal #2  RN Health Coach reviewed with patient pursed lipped breath. Health Coach encouraged patient to use frequent rest periods to conserve energy.      [facility] CM Care Plan Problem Two        Most Recent Value   Care Plan Problem Two  Increased blood pressure   Role Documenting the Problem Two  Health Coach   Care Plan for Problem Two  Active   [facility] CM Short Term Goal #1 (0-30 days)  Patient will maintain blood pressure less than 140/90 within 30 days.   [facility] CM Short Term Goal #1 Start Date  12/08/15   Interventions for Short Term Goal #2   RN Health Coach discussed with patient importance of  monitoring blood pressure and symptoms that may indicate blood pressure is elevated.        RN Health Coach will contact patient within one month and patient agrees to next outreach.  Bary Leriche, RN, MSN [facility] Care Management RN Telephonic Health Coach [phone]

## 2015-12-09 ENCOUNTER — Ambulatory Visit: Payer: Self-pay

## 2015-12-28 ENCOUNTER — Other Ambulatory Visit: Payer: Self-pay

## 2015-12-28 NOTE — Patient Outreach (Signed)
[facility]) Care Management  12/28/2015  [name] [birth year] [id number]   Telephone call to patient for monthly call.  No answer.  HIPAA compliant voice message left.    Plan: RN Health Coach will attempt patient within 2 weeks.  Bary Lericheionne J Maryanne Huneycutt, RN, MSN [facility] Care Management RN Telephonic Health Coach [phone]

## 2015-12-29 ENCOUNTER — Ambulatory Visit: Payer: PPO | Admitting: Cardiology

## 2015-12-29 ENCOUNTER — Ambulatory Visit: Payer: PPO | Admitting: Emergency Medicine

## 2015-12-29 DIAGNOSIS — R0602 Shortness of breath: Secondary | ICD-10-CM | POA: Diagnosis not present

## 2015-12-31 ENCOUNTER — Ambulatory Visit: Payer: PPO | Admitting: Cardiology

## 2016-01-05 ENCOUNTER — Ambulatory Visit (INDEPENDENT_AMBULATORY_CARE_PROVIDER_SITE_OTHER): Payer: PPO | Admitting: Emergency Medicine

## 2016-01-05 ENCOUNTER — Encounter: Payer: Self-pay | Admitting: Emergency Medicine

## 2016-01-05 ENCOUNTER — Other Ambulatory Visit: Payer: Self-pay

## 2016-01-05 VITALS — BP 142/84 | HR 78 | Ht 61.0 in | Wt 120.0 lb

## 2016-01-05 DIAGNOSIS — J449 Chronic obstructive pulmonary disease, unspecified: Secondary | ICD-10-CM | POA: Diagnosis not present

## 2016-01-05 MED ORDER — CLONAZEPAM 0.5 MG PO TABS: 0.2500 mg | ORAL_TABLET | Freq: Two times a day (BID) | ORAL | Status: DC

## 2016-01-05 NOTE — Patient Instructions (Addendum)
Please continue your Spiriva and Symbicort as you have been taking them  We will perform an overnight oximetry on room air Continue to use your oxygen at 3L/min with exertion.  We will refill your prescription for clonazepam until you can see Dr. Holwerda and decide whether you were going to stay on this long-term Take albuterol 2 puffs up to every 4 hours if needed for shortness of breath.  

## 2016-01-05 NOTE — Progress Notes (Signed)
Subjective:     Patient ID: [name], female   DOB: 1934-09-06, 81 y.o.   MRN: [id number]  HPI 81 yo woman, former smoker (50 pk-yrs), hx COPD, HTN and diastolic CHF, PVD. She was admitted for AE-COPD 10/7-10/11. CT scan during that admission showed scattered LL nodular disease. She returns for f/u, has been taking symbicort and spiriva. This was her 2nd hospitalization for AE. She is now feeling almost back to baseline, still with DOE - hasn't been shopping by herself. Her FEV1 was 0.57L in 07/2010. Very little cough, sputum. She does occasionally wheeze.  She uses SABA several times a week.   ROV 08/07/12 -- Follows up for her severe COPD, associated hypoxemia, frequent exacerbations. Has been managed on Spiriva + Symbicort. O2 2L/min pulsed w exertion >> she increased to 3L/min on her own. Also LL pulm nodular dizease by CT scan, due for repeat April 2014. She began to have chest tightness, cough, chest burning over the weekend (4-5 days ago). She started an old script of pred on her own > a taper that she had from before. Her chest tightness and breathing have improved. Rare cough, clear mucous.   08/17/12 Follow up  Present for a follow up for COPD flare .  Pt saw Cardiology on Tuesday-2/4  and they ordered avelox and prednisone. Pt states  cough is improved, but she is still having  increased SOB with rest and minimal activity. She denies any hemoptysis, orthopnea, PND, or leg swelling Chest x-ray is without acute process.  ROV 09/13/12 -- Follows up for her severe COPD, associated hypoxemia, frequent exacerbations. Was treated for an AE-COPD last month with avelox and pred taper. She is feeling better - back to baseline. Wearing o2 at 2-3L/min. Using a walker to ambulate. No wheeze or sputum. Using SABA 2-3x a day.   ROV 11/08/12 -- severe COPD, associated hypoxemia, frequent exacerbations. Also with scattered B pulm nodules identified on CT scan 04/2012 predominantly RUL. She returns today after  CT scan 4/4 >> stable. She is asking about the coronary plaque seen on the CT scan. She denies CP.  She is stable on spiriva + symbicort. No exacerbations. No new sx  ROV 02/07/13 -- severe COPD, associated hypoxemia, frequent exacerbations. Also with scattered B pulm nodules identified on CT scan 04/2012 predominantly RUL, stable 10/12/12. She has been doing well, no flares. Uses albuterol most days. She wears o2 with exertion, 2l/min.   ROV 05/06/13 -- severe COPD, associated hypoxemia, frequent exacerbations. [facility]. Also with scattered B pulm nodules identified on CT scan 04/2012 predominantly RUL, stable 10/12/12. Repeat CT done 05/02/13 shows stability of nodules, new irregular area of GG and consolidation.  She has been well, denies any PNA or exacerbations. No cough. She does have exertional dyspnea. Uses SABA 1-2x a day.   ROV 08/16/13 -- severe COPD, associated hypoxemia, frequent exacerbations. Pulmonary nodules and some GGI being followed by CT scan.  She started having some dry cough 1/28, was seen by her PCP and started Augmentin, has 2 more days to go. Was not treated with pred. Remains on symbicort, spiriva  ROV 09/13/13 -- severe COPD, associated hypoxemia, frequent exacerbations. We have also ben following some pulm nodules and GGI, last CT scan chest was 08/05/13 and showed stability. She need repeat CT in October. She has recovered from recent AE, treated with augmentin and pred.   ROV 05/13/14 -- follow up visit for severe COPD,  associated hypoxemia, pulmonary nodules. Her CT on 10/29 shows that her previously identified pulmonary nodules are unchanged and have been stable since 04/18/12 consistent with benign etiology. There is a new 1 cm sub-solid nodule in the right upper lobe. She tells me today that her exertion is limited. She is on 2L/min pulsed. Remains on spiriva and symbicort - the Anoro did not help as much.   ROV 12/02/14 --  Follow for COPD,  hypoxemia. She has pulm nodules on CT chest. PAULAs test since last time was reassuring. CT chest from 5/18 reviewed by me > the new sub solid RUL nodule is gone, all the other nodules are stable for > 2 years. She has been having more exertional dyspnea, the sensation that she cannot get a deep breath. We changed her oxygen to 3 L/m with exertion last time and she's been reliable with this. No real change in coughing or wheezing. No acute exacerbations  ROV 05/12/15 -- follow-up visit for COPD, hypoxemia. She has also had pulmonary nodules that we have followed with serial CT scans and which have been benign for greater than 2 years. She underwent PAULAs testing that was reassuring. At her last version we change Spiriva to respimat. She is also on Symbicort. She is using oxygen at 3 L/m. She is having dyspnea w exertion, is doing a bit less.   No cough, occasional wheeze. She is quite limited, can walk through the house. She believes that she is using her albuterol more.   ROV 01/05/16 -- [name] has a history of severe COPD and chronic hypoxemic respiratory failure. She also has a history of pulmonary nodules that we've followed with serial CT scans and deemed benign based on their stability. She is currently managed on Spiriva respimat, Symbicort. She was admitted in 3/17 for AE-COPD and resp failure. She believes that her breathing has improved. She has been dealing with labile BP, HTN. She is using O2 at 3L/min w exertion.                           CAT Score 05/12/2015 05/13/2014 09/13/2012 08/07/2012  Total CAT Score Objective:   Physical Exam Filed Vitals:   01/05/16 1526  BP: 142/84  Pulse: 78  Height:  (1.549 m)  Weight: 120 lb (54.432 kg)  SpO2: 98%   Gen: Pleasant, elderly woman, in no distress,  normal affect  ENT: No lesions,  mouth clear,  oropharynx clear, no postnasal drip  Neck: No JVD, no TMG, no carotid bruits  Lungs: No use of accessory muscles,  distant, diminished BS in bases, no wheeze  Cardiovascular: RRR, heart sounds normal, no murmur or gallops, no peripheral edema  Musculoskeletal: No deformities, no cyanosis or clubbing  Neuro: alert, non focal  Skin: Warm, no lesions or rashes    CT scan chest , personally reviewed by me 11/26/14 --  COMPARISON: CT 05/08/2014 04/18/2012  FINDINGS: Mediastinum/Nodes: No axillary supraclavicular lymphadenopathy. No mediastinal hilar lymphadenopathy no pericardial fluid. Esophagus is normal.  Lungs/Pleura: The new sub solid nodule described on comparison CT is no longer measurable.  Additional solid nodules are not changed. For example 7 mm nodule in the right upper lobe on image 27, series 3 is unchanged. Adjacent 6 mm nodule is also unchanged (image 26).  More superiorly in the right upper lobe band of linear nodular thickening measures 5 mm compared to 7  mm (image 11, series 3. This right upper lobe linear nodularity first appear CT of 05/02/2013 and has lengthed and thinned.  There is additional scattered sub 5 mm nodules, stable. 3 mm left upper lobe nodule image 17, unchanged  Upper abdomen: Limited view of the liver, kidneys, pancreas are unremarkable. Normal adrenal glands.  Musculoskeletal: No aggressive osseous lesion.  IMPRESSION: 1. Resolution of right upper lobe sub solid nodule. 2. Stable bilateral noncalcified nodules for greater than 2 years. 3. Band of linear nodular thickening at the right lung apex first presented on CT of 05/02/2013. This nodular thickening appears to continue to decrease in volume. Consider one additional follow-up for this right upper lobe bandlike nodular thickening in 6-12 months       Assessment:     COPD (chronic obstructive pulmonary disease) (HCC) Please continue your Spiriva and Symbicort as you have been taking them  We will perform an overnight oximetry on room air Continue to use your oxygen at 3L/min with  exertion.  We will refill your prescription for clonazepam until you can see Dr. Link SnufferHolwerda and decide whether you were going to stay on this long-term Take albuterol 2 puffs up to every 4 hours if needed for shortness of breath.    Levy Pupaobert Byrum, MD, PhD 01/05/2016, 4:17 PM [facility] [phone] or if no answer [phone]

## 2016-01-05 NOTE — Patient Outreach (Signed)
[facility]) Care Management  [facility]  01/05/2016   [name] [birth year] [id number]  Subjective: Telephone call to patient for monthly outreach.  Patient reports she is having some problems with ankle swelling and that yesterday she had problems with shortness of breath were she had to rest most of the day. Patient weighs daily and denies much fluctuation with her weights.  Patient also shares she had an episode of her blood pressure going up and calling 911 last week. She states she did not go to emergency room as her blood pressure went down before emergency medical services left.  Discussed with patient following up with her doctor/cardiologist about these episodes. She verbalized understanding but states that when she went to the emergency room last month they did not find anything.  Again stressed the importance of physician follow up.  Patient and spouse asked about her seeing a electrophysiology doctor. Advised them to discuss on cardiology appointment next month. They verbalized understanding. Discussed with patient COPD action plan and utilizing it as a guide to daily care.  She verbalized understanding.   Objective:   Encounter Medications:  Outpatient Encounter Prescriptions as of 01/05/2016  Medication Sig Note  . albuterol (PROVENTIL HFA;VENTOLIN HFA) 108 (90 BASE) MCG/ACT inhaler Inhale 2 puffs into the lungs every 6 (six) hours as needed. For shortness of breath   . aspirin 81 MG tablet Take 81 mg by mouth daily.     Marland Kitchen. aspirin-acetaminophen-caffeine (EXCEDRIN MIGRAINE) 250-250-65 MG tablet Take 1 tablet by mouth every 6 (six) hours as needed for headache.   Marland Kitchen. atorvastatin (LIPITOR) 10 MG tablet Take 10 mg by mouth daily.     . budesonide-formoterol (SYMBICORT) 160-4.5 MCG/ACT inhaler INHALE 2 PUFFS INTO THE LUNGS 2 (TWO) TIMES DAILY.   . Calcium Carb-Cholecalciferol (CALCIUM 600 + D PO) Take 1 tablet by mouth daily.   . clonazePAM (KLONOPIN) 0.5 MG  tablet Take 0.5 tablets (0.25 mg total) by mouth 2 (two) times daily.   . furosemide (LASIX) 40 MG tablet Take 1 tablet (40 mg total) by mouth daily.   . Magnesium 500 MG CAPS Take 1 capsule by mouth daily.   . Multiple Vitamin (MULTIVITAMIN) tablet Take 1 tablet by mouth daily.     . nebivolol (BYSTOLIC) 5 MG tablet Take 1 tablet (5 mg total) by mouth daily.   . niacin (NIASPAN) 1000 MG CR tablet Take 1,000 mg by mouth daily.   . potassium chloride (K-DUR) 10 MEQ tablet Take 4 tablets (40 mEq total) by mouth daily. (Patient taking differently: Take 20 mEq by mouth daily. ) 11/11/2015: Taking 20 meq daily  . Tiotropium Bromide Monohydrate (SPIRIVA RESPIMAT) 2.5 MCG/ACT AERS INHALE 2 PUFFS INTO THE LUNGS DAILY.   Marland Kitchen. UNABLE TO FIND Inhale 1.5-3 L into the lungs continuous.  06/17/2015: [facility] Received Sig:    No facility-administered encounter medications on file as of 01/05/2016.    Functional Status:  In your present state of health, do you have any difficulty performing the following activities: 11/11/2015 10/30/2015  Hearing? Malvin JohnsY Y  Vision? N N  Difficulty concentrating or making decisions? N N  Walking or climbing stairs? Y Y  Dressing or bathing? N N  Doing errands, shopping? Malvin JohnsY Y  Preparing Food and eating ? Y Y  Using the Toilet? N N  In the past six months, have you accidently leaked urine? N N  Do you have problems with loss of bowel  control? N N  Managing your Medications? N N  Managing your Finances? N N  Housekeeping or managing your Housekeeping? Malvin JohnsY Y    Fall/Depression Screening: PHQ 2/9 Scores 12/08/2015 11/11/2015 10/30/2015 01/14/2013  PHQ - 2 Score 0 0 0 2  PHQ- 9 Score - - - 3    Assessment: Patient continues to benefit from health coach outreach for disease management and support.    Plan:  [facility] Care [facility] Problem One        Most Recent Value   Care Plan Problem One  COPD knowledge deficit   Role Documenting the Problem One   Health Coach   Care Plan for Problem One  Active   [facility] Long Term Goal (31-90 days)  Patient will be able to explain COPD zones on action plan within the next 90 days    [facility] Long Term Goal Start Date  11/11/15   Interventions for Problem One Long Term Goal  RN Health Coach reinforced with patient COPD action plan and purpose.     [facility] CM Short Term Goal #1 (0-30 days)  Patient will be able to describe COPD green zone within 30 days.     [facility] CM Short Term Goal #1 Start Date  01/05/16 Staci Righter[goal continued]   Interventions for Short Term Goal #1  RN Health Coach reinforced with patient COPD green zone on action plan.     [facility] CM Short Term Goal #2 (0-30 days)  Patient will be able to describe techniques of controlling breath during episodes of shortness breath within 30 days.    [facility] CM Short Term Goal #2 Start Date  01/05/16 Staci Righter[goal continued]   Interventions for Short Term Goal #2  RN Health Coach reinforced with patient pursed lipped breath. Health Coach encouraged patient to use frequent rest periods to conserve energy.      [facility] CM Care Plan Problem Two        Most Recent Value   Care Plan Problem Two  Increased blood pressure   Role Documenting the Problem Two  Health Coach   Care Plan for Problem Two  Active   [facility] CM Short Term Goal #1 (0-30 days)  Patient will maintain blood pressure less than 140/90 within 30 days.   [facility] CM Short Term Goal #1 Start Date  12/08/15   Interventions for Short Term Goal #2   RN Health Coach reviewed with patient importance of  monitoring blood pressure and symptoms that may indicate blood pressure is elevated.  Encouraged patient to see cardiologist.     RN Health Coach will contact patient within one month and patient agrees to next outreach.  Bary Lericheionne J Shylin Keizer, RN, MSN [facility] Care Management RN Telephonic Health Coach [phone]

## 2016-01-05 NOTE — Assessment & Plan Note (Signed)
Please continue your Spiriva and Symbicort as you have been taking them  We will perform an overnight oximetry on room air Continue to use your oxygen at 3L/min with exertion.  We will refill your prescription for clonazepam until you can see Dr. Link SnufferHolwerda and decide whether you were going to stay on this long-term Take albuterol 2 puffs up to every 4 hours if needed for shortness of breath.

## 2016-01-05 NOTE — Addendum Note (Signed)
Addended by: Jaynee EaglesLEMONS, LINDSAY C on: 01/05/2016 04:24 PM   Modules accepted: Orders, SmartSet

## 2016-01-14 ENCOUNTER — Encounter: Payer: Self-pay | Admitting: *Deleted

## 2016-01-14 DIAGNOSIS — J449 Chronic obstructive pulmonary disease, unspecified: Secondary | ICD-10-CM | POA: Diagnosis not present

## 2016-01-26 ENCOUNTER — Telehealth: Payer: Self-pay | Admitting: Emergency Medicine

## 2016-01-26 NOTE — Telephone Encounter (Signed)
Let the pt know that she had 3 instances where her oxygen dropped while sleeping. Total time was 17 minutes. Based on this, she can probably forgo starting oxygen at night. If she would like to start this I am okay with this. If an order is placed, please order what ever liter dose the pt uses during the day.  Pt is aware of ONO results. She would like to NOT use oxygen at night time. Advised her that if she changed her mind to let us know. Nothing further was needed.

## 2016-01-27 ENCOUNTER — Encounter: Payer: Self-pay | Admitting: *Deleted

## 2016-01-27 ENCOUNTER — Ambulatory Visit (INDEPENDENT_AMBULATORY_CARE_PROVIDER_SITE_OTHER): Payer: PPO | Admitting: Cardiology

## 2016-01-27 VITALS — BP 168/78 | HR 63 | Ht 61.0 in | Wt 120.0 lb

## 2016-01-27 DIAGNOSIS — I10 Essential (primary) hypertension: Secondary | ICD-10-CM | POA: Diagnosis not present

## 2016-01-27 DIAGNOSIS — R0609 Other forms of dyspnea: Secondary | ICD-10-CM

## 2016-01-27 DIAGNOSIS — I739 Peripheral vascular disease, unspecified: Secondary | ICD-10-CM

## 2016-01-27 DIAGNOSIS — I110 Hypertensive heart disease with heart failure: Secondary | ICD-10-CM

## 2016-01-27 DIAGNOSIS — I5031 Acute diastolic (congestive) heart failure: Secondary | ICD-10-CM | POA: Diagnosis not present

## 2016-01-27 DIAGNOSIS — I5033 Acute on chronic diastolic (congestive) heart failure: Secondary | ICD-10-CM | POA: Diagnosis not present

## 2016-01-27 DIAGNOSIS — J449 Chronic obstructive pulmonary disease, unspecified: Secondary | ICD-10-CM

## 2016-01-27 DIAGNOSIS — R0600 Dyspnea, unspecified: Secondary | ICD-10-CM

## 2016-01-27 MED ORDER — FUROSEMIDE 40 MG PO TABS: ORAL_TABLET | ORAL | Status: DC

## 2016-01-27 MED ORDER — LOSARTAN POTASSIUM 25 MG PO TABS: 25.0000 mg | ORAL_TABLET | Freq: Every day | ORAL | Status: DC

## 2016-01-27 NOTE — Progress Notes (Signed)
Cardiology Office Note    Date:  01/27/2016   ID:  [name], DOB 03/09/1935, MRN [id number]  PCP:  [name], MD  Cardiologist:  Dr. Delton See   Chief Complaint  Patient presents with  . Appointment    patient has questions about lasix and dosage. 2 rx have been sent in 20mg  daily by Leron Croak, 40mg  daily by [facility]. She currently takes 40mg  daily.    History of Present Illness:  [name] is a 81 y.o. female with hx of PAD, HTN, COPD. She's had a question of infrarenal abdominal aorta dilatation on CT in the past. Subsequent ultrasounds have been negative for aneurysm. She last saw Dr. Daleen Squibb 8/12 after undergoing a "Life Screening Survey". This demonstrated mild carotid artery stenosis bilaterally (no percentage given) and mildly reduced ABIs bilaterally (0.9). Recommendation was to repeat her carotid Dopplers in 02/2013. Admitted 04/2012 with a COPD exacerbation. She was followed by pulmonary/critical care. Chest CT was negative for pulmonary embolism. She did have multiple lung nodules noted and needs followup CT in 3-6 months. Echocardiogram 04/16/12: Mild focal basal septal hypertrophy, EF 70-75% with dynamic obstruction with mid cavity obliteration, grade 1 diastolic dysfunction, MAC, atrial septal thickening with findings consistent with lipomatous hypertrophy.   Dr. Delton See saw her 05/09/12 and titrated her beta blocker given her hypertrophic cardiomyopathy to slow her HR some. She remains on chronic O2. Chronic dyspnea.  She saw her again on 07/23/12 and she complained of chest pain. Of note, she had coronary calcifications on chest CT in 04/2012. Dobutamine Myoview 08/06/12: No ischemia, EF 85%.      The patient was hospitalized March 1 of 09/15/2015 for acute on chronic COPD exacerbation with pneumonia. With hypoxia and hypercarbia. Her troponin was elevated 0.61 with no acute EKG findings, she was treated for acute diastolic CHF exacerbation as well, weight at admission was 134  pounds and at discharge it was 125. BNP on 3/1 was 58.0.     She saw Dr. Delton See 10/05/15 with worsening dyspnea on exertion that was felt multifactorial secondary to COPD and hypertrophic cardiomyopathy. She recommended continuing beta blockers. She felt if her symptoms continued to deteriorate he might consider left heart catheterization.  01/27/2016 - the patient is coming after 3 months, today she states that her shortness of breath has worsened and she also developed lower extremity edema. She uses oxygen 24 7 she denies any fever or chills but had some paroxysmal nocturnal dyspnea. She denies any chest pain. She has been taking Lasix 40 mg daily. Denies any palpitations or syncope. She is complaining of elevated blood pressure she brings a diary and her blood pressure runs all the way up to 200 systolic blood pressure. She doesn't feel good when her blood pressure is too high.   Past Medical History  Diagnosis Date  . PVD (peripheral vascular disease) (HCC)   . Actinomycosis, cervicofacial   . Emphysema   . COPD (chronic obstructive pulmonary disease) (HCC)   . HTN (hypertension)   . Hx of echocardiogram     Echocardiogram 04/16/12: Mild focal basal septal hypertrophy, EF 70-75% with dynamic obstruction with mid cavity obliteration, grade 1 diastolic dysfunction, MAC, atrial septal thickening with findings consistent with lipomatous hypertrophy  . Hyperlipidemia   . CAD (coronary artery disease)     a. coronary calcification on prior Chest CT;  b. Dob MV 1/14:  EF 85%, no ischemia    Past Surgical History  Procedure Laterality Date  . Appendectomy    .  Tonsillectomy    . Bone graft hip iliac crest      Current Medications: Outpatient Prescriptions Prior to Visit  Medication Sig Dispense Refill  . albuterol (PROVENTIL HFA;VENTOLIN HFA) 108 (90 BASE) MCG/ACT inhaler Inhale 2 puffs into the lungs every 6 (six) hours as needed. For shortness of breath    . aspirin 81 MG tablet Take 81  mg by mouth daily.      Marland Kitchen aspirin-acetaminophen-caffeine (EXCEDRIN MIGRAINE) 250-250-65 MG tablet Take 1 tablet by mouth every 6 (six) hours as needed for headache.    Marland Kitchen atorvastatin (LIPITOR) 10 MG tablet Take 10 mg by mouth daily.      . budesonide-formoterol (SYMBICORT) 160-4.5 MCG/ACT inhaler INHALE 2 PUFFS INTO THE LUNGS 2 (TWO) TIMES DAILY. 3 Inhaler 3  . Calcium Carb-Cholecalciferol (CALCIUM 600 + D PO) Take 1 tablet by mouth daily.    . clonazePAM (KLONOPIN) 0.5 MG tablet Take 0.5 tablets (0.25 mg total) by mouth 2 (two) times daily. 60 tablet 0  . Magnesium 500 MG CAPS Take 1 capsule by mouth daily.    . Multiple Vitamin (MULTIVITAMIN) tablet Take 1 tablet by mouth daily.      . nebivolol (BYSTOLIC) 5 MG tablet Take 1 tablet (5 mg total) by mouth daily. 90 tablet 3  . niacin (NIASPAN) 1000 MG CR tablet Take 1,000 mg by mouth daily.    . potassium chloride (K-DUR) 10 MEQ tablet Take 4 tablets (40 mEq total) by mouth daily. (Patient taking differently: Take 20 mEq by mouth daily. ) 360 tablet 3  . Tiotropium Bromide Monohydrate (SPIRIVA RESPIMAT) 2.5 MCG/ACT AERS INHALE 2 PUFFS INTO THE LUNGS DAILY. 3 Inhaler 3  . UNABLE TO FIND Inhale 1.5-3 L into the lungs continuous.     . furosemide (LASIX) 40 MG tablet Take 1 tablet (40 mg total) by mouth daily. 90 tablet 3   No facility-administered medications prior to visit.     Allergies:   Penicillins; Pneumococcal vaccine; and Pneumovax   Social History   Social History  . Marital Status: Married    Spouse Name: N/A  . Number of Children: 4  . Years of Education: N/A   Occupational History  . retired     [employer]   Social History Main Topics  . Smoking status: Former Smoker -- 1.00 packs/day for 49 years    Types: Cigarettes    Quit date: 07/11/2005  . Smokeless tobacco: Never Used     Comment: smoked for 48 years 1 ppd  . Alcohol Use: No  . Drug Use: No  . Sexual Activity: Not Asked   Other Topics Concern  . None    Social History Narrative     Family History:  The patient's    family history includes Coronary artery disease in her brother; Emphysema in her father, sister, sister, and sister; Heart disease in her father and mother; Stomach cancer in her father and sister.   ROS:   Please see the history of present illness.    Review of Systems  Constitution: Negative.  HENT: Negative.   Cardiovascular: Negative.   Respiratory: Positive for shortness of breath.   Hematologic/Lymphatic: Bruises/bleeds easily.  Musculoskeletal: Positive for joint swelling. Negative for joint pain.  Gastrointestinal: Negative.   Genitourinary: Negative.   Neurological: Negative.    All other systems reviewed and are negative.   PHYSICAL EXAM:   VS:  BP 168/78 mmHg  Pulse 63  Ht  (1.549 m)  Wt 120 lb (  54.432 kg)  BMI 22.69 kg/m2  SpO2 90%   GEN: Well nourished, well developed, in no acute distress Neck: no JVD, carotid bruits, or masses Cardiac:  RRR; distant heart sounds no murmurs, rubs, or gallops, 1+ LE edema B/L  Respiratory:  Decreased breath sounds throughout but clear to auscultation bilaterally, crackles at both bases, wearing oxygen GI: soft, nontender, nondistended, + BS MS: no deformity or atrophy Skin: Some cyanosis of the toes warm and dry, no rash Neuro:  Alert and Oriented x 3, Strength and sensation are intact Psych: euthymic mood, full affect  Wt Readings from Last 3 Encounters:  01/27/16 120 lb (54.432 kg)  01/05/16 120 lb (54.432 kg)  11/02/15 122 lb 12.8 oz (55.702 kg)    Studies/Labs Reviewed:   EKG:  EKG is not ordered today.   Recent Labs: 09/13/2015: ALT 26; TSH 0.603 09/15/2015: Magnesium 1.9 10/05/2015: Brain Natriuretic Peptide 20.1 12/07/2015: BUN 11; Creatinine, Ser 0.68; Hemoglobin 13.8; Platelets 319; Potassium 4.1; Sodium 140   Lipid Panel    Component Value Date/Time   CHOL 213 12/16/2008   TRIG 166 12/16/2008   HDL 62 12/16/2008   LDLCALC 116 12/16/2008     Additional studies/ records that were reviewed today include:   TTE: 09/15/2015 Left ventricle: The cavity size was normal. There was mild focal   basal hypertrophy of the septum. Systolic function was vigorous.   The estimated ejection fraction was in the range of 65% to 70%.   Wall motion was normal; there were no regional wall motion   abnormalities. Doppler parameters are consistent with abnormal   left ventricular relaxation (grade 1 diastolic dysfunction).     ASSESSMENT:    1. Acute diastolic heart failure (HCC)   2. Essential hypertension   3. Acute on chronic diastolic CHF (congestive heart failure), NYHA class 3 (HCC)   4. DOE (dyspnea on exertion)   5. Hypertensive heart disease with CHF (congestive heart failure) (HCC)   6. Peripheral arterial disease (HCC)   7. COPD, severe (HCC)      PLAN:  In order of problems listed above:  Patient has hyperdynamic LVEF with mid cavitary obstruction gradient therefore we were pushing for beta blockers, we will continue bystolic, and losartan 25 mg daily for uncontrolled hypertension. She is clinically fluid overloaded I will increase her Lasix from 20 mg daily to 40 mg in the morning and 20 in the afternoon. She'll be followed in 2 weeks by a PA in our clinic and she'll have her labs checked including CMP and BNP.  She is currently fluid overloaded, however her shortness of breath is multifactorial and includes diastolic heart failure and severe COPD.   Medication Adjustments/Labs and Tests Ordered: Current medicines are reviewed at length with the patient today.  Concerns regarding medicines are outlined above.  Medication changes, Labs and Tests ordered today are listed in the Patient Instructions below. Patient Instructions  Medication Instructions:  Your physician has recommended you make the following change in your medication:  1) START Losartan 25mg  daily 2) INCREASE Lasix to 40mg  in the morning and 20mg  in the  afternoon until you follow up appointment in 2 weeks    Labwork: Your physician recommends that you return for lab work in: 2 weeks (Bnp, Bmet, Lft)   Testing/Procedures: None ordered  Follow-Up: Your physician recommends that you schedule a follow-up appointment in: 2 weeks with an APP   Any Other Special Instructions Will Be Listed Below (If Applicable).  If you need a refill on your cardiac medications before your next appointment, please call your pharmacy.       Signed, Tobias AlexanderKatarina Kaled Allende, MD  01/27/2016 2:21 PM    [facility] [address], [place], [place]  [address] Phone: [phone]; Fax: [phone]

## 2016-01-27 NOTE — Patient Instructions (Signed)
Medication Instructions:  Your physician has recommended you make the following change in your medication:  1) START Losartan 25mg  daily 2) INCREASE Lasix to 40mg  in the morning and 20mg  in the afternoon until you follow up appointment in 2 weeks    Labwork: Your physician recommends that you return for lab work in: 2 weeks (Bnp, Bmet, Lft)   Testing/Procedures: None ordered  Follow-Up: Your physician recommends that you schedule a follow-up appointment in: 2 weeks with an APP   Any Other Special Instructions Will Be Listed Below (If Applicable).     If you need a refill on your cardiac medications before your next appointment, please call your pharmacy.

## 2016-02-01 ENCOUNTER — Other Ambulatory Visit: Payer: Self-pay

## 2016-02-01 NOTE — Patient Outreach (Signed)
[facility]) Care Management  02/01/2016  SHIKHA SAWDY 09/19/1934 [id number]   Telephone call to patient for monthly call.  Husband answered stating they are eating lunch and would call back.    Plan: RN Health Coach will wait return phone call. If no return call will attempt again in the month of July.   Bary Leriche, RN, MSN [facility] Care Management RN Telephonic Health Coach [phone]

## 2016-02-01 NOTE — Patient Outreach (Signed)
[facility]) Care Management  [place]  02/01/2016   [name] Aug 27, 1934 [id number]  Subjective: Telephone call from patient for monthly call. Patient reports she is doing good. She reports that her blood pressure has been better and that she saw her cardiologist last week and that her lasix was increased and losartan was started. Patient reports last blood pressure was 108/73.  Patient reports that her weight was 119 lbs today and that she lost a couple of pounds. Patient reports her usual swelling to her ankles.  No increased shortness of breath.  Patient to follow up with cardiology next week.  Discussed with patient COPD and action plan. She verbalized understanding.    Objective:   Encounter Medications:  Outpatient Encounter Prescriptions as of 02/01/2016  Medication Sig Note  . albuterol (PROVENTIL HFA;VENTOLIN HFA) 108 (90 BASE) MCG/ACT inhaler Inhale 2 puffs into the lungs every 6 (six) hours as needed. For shortness of breath   . aspirin 81 MG tablet Take 81 mg by mouth daily.     Marland Kitchen aspirin-acetaminophen-caffeine (EXCEDRIN MIGRAINE) 250-250-65 MG tablet Take 1 tablet by mouth every 6 (six) hours as needed for headache.   Marland Kitchen atorvastatin (LIPITOR) 10 MG tablet Take 10 mg by mouth daily.     . budesonide-formoterol (SYMBICORT) 160-4.5 MCG/ACT inhaler INHALE 2 PUFFS INTO THE LUNGS 2 (TWO) TIMES DAILY.   . Calcium Carb-Cholecalciferol (CALCIUM 600 + D PO) Take 1 tablet by mouth daily.   . clonazePAM (KLONOPIN) 0.5 MG tablet Take 0.5 tablets (0.25 mg total) by mouth 2 (two) times daily.   . furosemide (LASIX) 40 MG tablet Take '40mg'$  in the morning and '20mg'$  in the afternoon daily   . losartan (COZAAR) 25 MG tablet Take 1 tablet (25 mg total) by mouth daily.   . Magnesium 500 MG CAPS Take 1 capsule by mouth daily.   . Multiple Vitamin (MULTIVITAMIN) tablet Take 1 tablet by mouth daily.     . nebivolol (BYSTOLIC) 5 MG tablet Take 1 tablet (5 mg total) by  mouth daily.   . niacin (NIASPAN) 1000 MG CR tablet Take 1,000 mg by mouth daily.   . potassium chloride (K-DUR) 10 MEQ tablet Take 4 tablets (40 mEq total) by mouth daily. (Patient taking differently: Take 20 mEq by mouth daily. ) 11/11/2015: Taking 20 meq daily  . Tiotropium Bromide Monohydrate (SPIRIVA RESPIMAT) 2.5 MCG/ACT AERS INHALE 2 PUFFS INTO THE LUNGS DAILY.   Marland Kitchen UNABLE TO FIND Inhale 1.5-3 L into the lungs continuous.  06/17/2015: Received from: [place]:    No facility-administered encounter medications on file as of 02/01/2016.     Functional Status:  In your present state of health, do you have any difficulty performing the following activities: 11/11/2015 10/30/2015  Hearing? [name]  Vision? N N  Difficulty concentrating or making decisions? N N  Walking or climbing stairs? Y Y  Dressing or bathing? N N  Doing errands, shopping? [name]  Preparing Food and eating ? Y Y  Using the Toilet? N N  In the past six months, have you accidently leaked urine? N N  Do you have problems with loss of bowel control? N N  Managing your Medications? N N  Managing your Finances? N N  Housekeeping or managing your Housekeeping? [name]  Some recent data might be hidden    Fall/Depression Screening: PHQ 2/9 Scores 02/01/2016 12/08/2015 11/11/2015 10/30/2015 01/14/2013  PHQ - 2 Score 0  0 0 0 2  PHQ- 9 Score - - - - 3    Assessment: Patient continues to benefit from health coach outreach for disease management and support.    Plan:  [facility] CM Care Plan Problem One   Flowsheet Row Most Recent Value  Care Plan Problem One  COPD knowledge deficit  Role Documenting the Problem One  Health Coach  Care Plan for Problem One  Active  [facility] Long Term Goal (31-90 days)  Patient will be able to explain COPD zones on action plan within the next 90 days   [facility] Long Term Goal Start Date  02/01/16 Barrie Folk continued]  Interventions for Problem One Long Term Goal  RN Health Coach reviewed  with patient COPD action plan and purpose.    [facility] CM Short Term Goal #1 (0-30 days)  Patient will be able to describe COPD green zone within 30 days.    [facility] CM Short Term Goal #1 Start Date  01/05/16  [facility] CM Short Term Goal #1 Met Date  02/01/16  Interventions for Short Term Goal #1  Patient able to describe green zone.   [facility] CM Short Term Goal #2 (0-30 days)  Patient will be able to describe techniques of controlling breath during episodes of shortness breath within 30 days.   [facility] CM Short Term Goal #2 Start Date  01/05/16  [facility] Goal #2 Met Date  02/01/16  Interventions for Short Term Goal #2  Patient able to describe purse lipped breathing.   [facility] CM Short Term Goal #3 (0-30 days)  Patient will be able to name at least 3 of COPD yellow zone symptoms within 30 days.  [facility] CM Short Term Goal #3 Start Date  02/01/16  Interventions for Short Tern Goal #3  [place] reviewed with patient yellow zone of heart failure zone chart.      [facility] CM Care Plan Problem Two   Flowsheet Row Most Recent Value  Care Plan Problem Two  Increased blood pressure  Role Documenting the Problem Two  [place] for Problem Two  Not Active  [facility] CM Short Term Goal #1 (0-30 days)  Patient will maintain blood pressure less than 140/90 within 30 days.  [facility] CM Short Term Goal #1 Start Date  12/08/15  [facility] CM Short Term Goal #1 Met Date   02/01/16  Interventions for Short Term Goal #2   Patient reports since taking losartan blood pressure has been less than 140/90      RN Health Coach will contact patient in the month of August and patient agrees to next outreach.  Jone Baseman, RN, MSN [place] [phone]

## 2016-02-02 ENCOUNTER — Encounter: Payer: Self-pay | Admitting: Emergency Medicine

## 2016-02-08 ENCOUNTER — Ambulatory Visit: Payer: Self-pay

## 2016-02-10 ENCOUNTER — Encounter: Payer: Self-pay | Admitting: Nurse Practitioner

## 2016-02-10 ENCOUNTER — Ambulatory Visit (INDEPENDENT_AMBULATORY_CARE_PROVIDER_SITE_OTHER): Payer: PPO | Admitting: Nurse Practitioner

## 2016-02-10 ENCOUNTER — Other Ambulatory Visit: Payer: PPO

## 2016-02-10 DIAGNOSIS — I10 Essential (primary) hypertension: Secondary | ICD-10-CM

## 2016-02-10 DIAGNOSIS — I5031 Acute diastolic (congestive) heart failure: Secondary | ICD-10-CM | POA: Diagnosis not present

## 2016-02-10 LAB — HEPATIC FUNCTION PANEL
ALT: 17 U/L (ref 6–29)
AST: 14 U/L (ref 10–35)
Albumin: 4 g/dL (ref 3.6–5.1)
Alkaline Phosphatase: 49 U/L (ref 33–130)
Bilirubin, Direct: 0.1 mg/dL (ref <=0.2)
Indirect Bilirubin: 0.2 mg/dL (ref 0.2–1.2)
Total Bilirubin: 0.3 mg/dL (ref 0.2–1.2)
Total Protein: 6.7 g/dL (ref 6.1–8.1)

## 2016-02-10 LAB — BASIC METABOLIC PANEL
BUN: 25 mg/dL (ref 7–25)
CO2: 31 mmol/L (ref 20–31)
Calcium: 9.4 mg/dL (ref 8.6–10.4)
Chloride: 99 mmol/L (ref 98–110)
Creat: 0.83 mg/dL (ref 0.60–0.93)
Glucose, Bld: 100 mg/dL — ABNORMAL HIGH (ref 65–99)
Potassium: 4.6 mmol/L (ref 3.5–5.3)
Sodium: 140 mmol/L (ref 135–146)

## 2016-02-10 NOTE — Patient Instructions (Addendum)
We will be checking the following labs today per Dr. Delton See - BNP, BMET and LFTs   Medication Instructions:    Continue with your current medicines.     Testing/Procedures To Be Arranged:  N/A  Follow-Up:   See Dr. Delton See in 2 to 3 months    Other Special Instructions:   N/A    If you need a refill on your cardiac medications before your next appointment, please call your pharmacy.   Call the [facility] office at [phone] if you have any questions, problems or concerns.

## 2016-02-10 NOTE — Progress Notes (Signed)
CARDIOLOGY OFFICE NOTE  Date:  02/10/2016    [name] Date of Birth: 09-Feb-1935 Medical Record #[id number]  PCP:  [name], MD  Cardiologist:  Delton See  Chief Complaint  Patient presents with  . Congestive Heart Failure  . Hypertension  . Shortness of Breath  . PAD    2 week check - seen for Dr. Delton See    History of Present Illness: [name] is a 81 y.o. female who presents today for a 2 week check. Seen for Dr. Delton See.   She has a hx of PAD, HTN, COPD. She's had a question of infrarenal abdominal aorta dilatation on CT in the past. Subsequent ultrasounds have been negative for aneurysm. She last saw Dr. Daleen Squibb 8/12 after undergoing a "Life Screening Survey". This demonstrated mild carotid artery stenosis bilaterally (no percentage given) and mildly reduced ABIs bilaterally (0.9). Recommendation was to repeat her carotid Dopplers in 02/2013. Admitted 04/2012 with a COPD exacerbation. She was followed by pulmonary/critical care. Chest CT was negative for pulmonary embolism. She did have multiple lung nodules noted and needs followup CT in 3-6 months. Echocardiogram 04/16/12: Mild focal basal septal hypertrophy, EF 70-75% with dynamic obstruction with mid cavity obliteration, grade 1 diastolic dysfunction, MAC, atrial septal thickening with findings consistent with lipomatous hypertrophy.  Dr. Delton See saw her 05/09/12 and titrated her beta blocker given her hypertrophic cardiomyopathy to slow her HR some. She is on chronic O2. Chronic dyspnea. She saw her again on 07/23/12 and she complained of chest pain. Of note, she had coronary calcifications on chest CT in 04/2012. Dobutamine Myoview 08/06/12: No ischemia, EF 85%.   The patient was hospitalized March 1 of 09/15/2015 for acute on chronic COPD exacerbation with pneumonia. With hypoxia and hypercarbia. Her troponin was elevated 0.61 with no acute EKG findings, she was treated for acute diastolic CHF exacerbation as well,  weight at admission was 134 pounds and at discharge it was 125. BNP on 3/1 was 58.0.   She saw Dr. Delton See 10/05/15 with worsening dyspnea on exertion that was felt multifactorial secondary to COPD and hypertrophic cardiomyopathy. She recommended continuing beta blockers. She felt if her symptoms continued to deteriorate she might consider left heart catheterization.  Seen back 2 weeks ago - more short of breath and more swelling. Using oxygen constantly. BP elevated. She was started on Losartan and had her Lasix increased.   Comes in today. Here with her husband. She is in a wheelchair. She remains short of breath - this is chronic. Very limited with her activities. Really does not go out of the house very much. She feels ok for the most part and seems to be at her baseline. No chest pain. On her oxygen all the time. Husband is asking about if an EP consult would be helpful but she does not have any EP issues. Not swelling. Weight seems stable. Don't really see where extra diuretics have helped. She is for labs today. Her BP has improved with the low dose ARB therapy.   Past Medical History:  Diagnosis Date  . Actinomycosis, cervicofacial   . CAD (coronary artery disease)    a. coronary calcification on prior Chest CT;  b. Dob MV 1/14:  EF 85%, no ischemia  . COPD (chronic obstructive pulmonary disease) (HCC)   . Emphysema   . HTN (hypertension)   . Hx of echocardiogram    Echocardiogram 04/16/12: Mild focal basal septal hypertrophy, EF 70-75% with dynamic obstruction with mid cavity  obliteration, grade 1 diastolic dysfunction, MAC, atrial septal thickening with findings consistent with lipomatous hypertrophy  . Hyperlipidemia   . PVD (peripheral vascular disease) (HCC)     Past Surgical History:  Procedure Laterality Date  . APPENDECTOMY    . BONE GRAFT HIP ILIAC CREST    . TONSILLECTOMY       Medications: Current Outpatient Prescriptions  Medication Sig Dispense Refill  .  albuterol (PROVENTIL HFA;VENTOLIN HFA) 108 (90 BASE) MCG/ACT inhaler Inhale 2 puffs into the lungs every 6 (six) hours as needed. For shortness of breath    . aspirin 81 MG tablet Take 81 mg by mouth daily.      Marland Kitchen aspirin-acetaminophen-caffeine (EXCEDRIN MIGRAINE) 250-250-65 MG tablet Take 1 tablet by mouth every 6 (six) hours as needed for headache.    Marland Kitchen atorvastatin (LIPITOR) 10 MG tablet Take 10 mg by mouth daily.      . budesonide-formoterol (SYMBICORT) 160-4.5 MCG/ACT inhaler INHALE 2 PUFFS INTO THE LUNGS 2 (TWO) TIMES DAILY. 3 Inhaler 3  . Calcium Carb-Cholecalciferol (CALCIUM 600 + D PO) Take 1 tablet by mouth daily.    . clonazePAM (KLONOPIN) 0.5 MG tablet Take 0.5 tablets (0.25 mg total) by mouth 2 (two) times daily. 60 tablet 0  . furosemide (LASIX) 40 MG tablet Take 40mg  in the morning and 20mg  in the afternoon daily 45 tablet 1  . losartan (COZAAR) 25 MG tablet Take 1 tablet (25 mg total) by mouth daily. 90 tablet 3  . Magnesium 500 MG CAPS Take 1 capsule by mouth daily.    . Multiple Vitamin (MULTIVITAMIN) tablet Take 1 tablet by mouth daily.      . nebivolol (BYSTOLIC) 5 MG tablet Take 1 tablet (5 mg total) by mouth daily. 90 tablet 3  . niacin (NIASPAN) 1000 MG CR tablet Take 1,000 mg by mouth daily.    . potassium chloride (K-DUR) 10 MEQ tablet Take 10 mEq by mouth 2 (two) times daily.    . Tiotropium Bromide Monohydrate (SPIRIVA RESPIMAT) 2.5 MCG/ACT AERS INHALE 2 PUFFS INTO THE LUNGS DAILY. 3 Inhaler 3  . UNABLE TO FIND Inhale 1.5-3 L into the lungs continuous.      No current facility-administered medications for this visit.     Allergies: Allergies  Allergen Reactions  . Penicillins Hives    Has taken it since.  . Pneumococcal Vaccine Swelling    Severe localized swelling in arm  . Pneumovax [Pneumococcal Polysaccharide Vaccine] Hives    Social History: The patient  reports that she quit smoking about 10 years ago. Her smoking use included Cigarettes. She has a  49.00 pack-year smoking history. She has never used smokeless tobacco. She reports that she does not drink alcohol or use drugs.   Family History: The patient's family history includes Coronary artery disease in her brother; Emphysema in her father, sister, sister, and sister; Heart disease in her father and mother; Stomach cancer in her father and sister.   Review of Systems: Please see the history of present illness.   Otherwise, the review of systems is positive for none.   All other systems are reviewed and negative.   Physical Exam: VS:  BP 120/70   Pulse 68   Ht 5\' 1"  (1.549 m)   Wt 120 lb (54.4 kg)   SpO2 100% Comment: at rest  BMI 22.67 kg/m  .  BMI Body mass index is 22.67 kg/m.  Wt Readings from Last 3 Encounters:  02/10/16 120 lb (54.4 kg)  01/27/16  120 lb (54.4 kg)  01/05/16 120 lb (54.4 kg)    General: Pleasant.  Chronically ill appearing. She is alert and in no acute distress. She has oxygen in place. In a wheelchair.  HEENT: Normal.  Neck: Supple, no JVD, carotid bruits, or masses noted.  Cardiac: Heart tones are distant. No edema.  Respiratory:  Lungs with decreased breath sounds and mildly increased work of breathing.  GI: Soft and nontender.  MS: No deformity or atrophy. Gait not tested Skin: Warm and dry. Color is normal.  Neuro:  Strength and sensation are intact and no gross focal deficits noted.  Psych: Alert, appropriate and with normal affect.   LABORATORY DATA:  EKG:  EKG is not ordered today.   Lab Results  Component Value Date   WBC 8.0 12/07/2015   HGB 13.8 12/07/2015   HCT 42.9 12/07/2015   PLT 319 12/07/2015   GLUCOSE 127 (H) 12/07/2015   CHOL 213 12/16/2008   TRIG 166 12/16/2008   HDL 62 12/16/2008   LDLCALC 116 12/16/2008   ALT 26 09/13/2015   AST 18 09/13/2015   NA 140 12/07/2015   K 4.1 12/07/2015   CL 104 12/07/2015   CREATININE 0.68 12/07/2015   BUN 11 12/07/2015   CO2 30 12/07/2015   TSH 0.603 09/13/2015   INR 0.91  09/09/2015    BNP (last 3 results)  Recent Labs  09/09/15 0255 10/05/15 1159  BNP 58.0 20.1    ProBNP (last 3 results) No results for input(s): PROBNP in the last 8760 hours.   Other Studies Reviewed Today:   TTE: 09/15/2015 Left ventricle: The cavity size was normal. There was mild focal  basal hypertrophy of the septum. Systolic function was vigorous.  The estimated ejection fraction was in the range of 65% to 70%.  Wall motion was normal; there were no regional wall motion  abnormalities. Doppler parameters are consistent with abnormal  left ventricular relaxation (grade 1 diastolic dysfunction).  Myoview Impression from 02/2014 Exercise Capacity:  Lexiscan with no exercise. BP Response:  Hypertensive blood pressure response. Clinical Symptoms:  There is dyspnea. ECG Impression:  No significant ST segment change suggestive of ischemia. Comparison with Prior Nuclear Study: Compared to 08/06/12, no change.  Overall Impression:  Normal stress nuclear study.  LV Ejection Fraction: 71%.  LV Wall Motion:  NL LV Function; NL Wall Motion  [name]   Assessment/Plan:  1. Chronic dyspnea - I suspect this is multifactorial and more related to her lung disease. She seems to be at her baseline.   2. Hypertrophic CM  3. Chronic diastolic HF - BNP today. I have left her on her current regimen for now.   4. COPD - I suspect this is her primary problem - she is on chronic oxygen therapy  5. HTN - much improved on current regimen. Lab today. I have left her on her current regimen.   Current medicines are reviewed with the patient today.  The patient does not have concerns regarding medicines other than what has been noted above.  The following changes have been made:  See above.  Labs/ tests ordered today include:   No orders of the defined types were placed in this encounter.    Disposition:   FU with Dr. Delton See and her team in    Patient is agreeable to  this plan and will call if any problems develop in the interim.   Signed: Rosalio Macadamia, RN, ANP-C 02/10/2016 4:10 PM  [facility] [address] [place], [place]  [address] Phone: [phone] Fax: [phone]

## 2016-02-11 LAB — BRAIN NATRIURETIC PEPTIDE: Brain Natriuretic Peptide: 15.7 pg/mL (ref <100)

## 2016-02-26 ENCOUNTER — Other Ambulatory Visit: Payer: Self-pay

## 2016-02-26 NOTE — Patient Outreach (Signed)
[facility]) Care Management  02/26/2016  [name] 1934/10/26 [id number]   Telephone call to patient for monthly call. No answer.  Unable to leave a message.    Plan: RN Health Coach will attempt patient again in the month of August.    [name] [name], RN, MSN [facility] Care Management RN Telephonic Health Coach [phone]

## 2016-03-04 ENCOUNTER — Ambulatory Visit: Payer: Self-pay

## 2016-03-07 ENCOUNTER — Other Ambulatory Visit: Payer: Self-pay

## 2016-03-07 NOTE — Patient Outreach (Signed)
[facility]) Care Management  03/07/2016  Terald SleeperHazel H Kinne 08-26-1934 [id number]   2nd telephone call to patient for monthly call. No answer.  HIPAA compliant voice message left.    Plan: RN Health Coach will attempt patient in the month of September.    Bary Lericheionne J Tiffny Gemmer, RN, MSN [facility] Care Management RN Telephonic Health Coach [phone]

## 2016-03-17 ENCOUNTER — Other Ambulatory Visit: Payer: Self-pay

## 2016-03-17 NOTE — Patient Outreach (Signed)
[facility]) Care Management  [facility] Care Manager  03/17/2016   [name] 10-May-1935 [id number]  Subjective: Telephone call to patient for monthly call. Patient reports she is doing good. Patient reports that her breathing has been good and was given the okay to stop her oxygen at night for which she is relieved.  Patient reports she sees her pulmonologist later this month.  Discussed with patient COPD action plan and when to notify physician.  She verbalized understanding.  No concerns.    Objective:   Encounter Medications:  Outpatient Encounter Prescriptions as of 03/17/2016  Medication Sig Note  . albuterol (PROVENTIL HFA;VENTOLIN HFA) 108 (90 BASE) MCG/ACT inhaler Inhale 2 puffs into the lungs every 6 (six) hours as needed. For shortness of breath   . aspirin 81 MG tablet Take 81 mg by mouth daily.     Marland Kitchen aspirin-acetaminophen-caffeine (EXCEDRIN MIGRAINE) 250-250-65 MG tablet Take 1 tablet by mouth every 6 (six) hours as needed for headache.   Marland Kitchen atorvastatin (LIPITOR) 10 MG tablet Take 10 mg by mouth daily.     . budesonide-formoterol (SYMBICORT) 160-4.5 MCG/ACT inhaler INHALE 2 PUFFS INTO THE LUNGS 2 (TWO) TIMES DAILY.   . Calcium Carb-Cholecalciferol (CALCIUM 600 + D PO) Take 1 tablet by mouth daily.   . clonazePAM (KLONOPIN) 0.5 MG tablet Take 0.5 tablets (0.25 mg total) by mouth 2 (two) times daily.   . furosemide (LASIX) 40 MG tablet Take 40mg  in the morning and 20mg  in the afternoon daily   . losartan (COZAAR) 25 MG tablet Take 1 tablet (25 mg total) by mouth daily.   . Magnesium 500 MG CAPS Take 1 capsule by mouth daily.   . Multiple Vitamin (MULTIVITAMIN) tablet Take 1 tablet by mouth daily.     . nebivolol (BYSTOLIC) 5 MG tablet Take 1 tablet (5 mg total) by mouth daily.   . niacin (NIASPAN) 1000 MG CR tablet Take 1,000 mg by mouth daily.   . potassium chloride (K-DUR) 10 MEQ tablet Take 10 mEq by mouth 2 (two) times daily.   . Tiotropium Bromide  Monohydrate (SPIRIVA RESPIMAT) 2.5 MCG/ACT AERS INHALE 2 PUFFS INTO THE LUNGS DAILY.   Marland Kitchen UNABLE TO FIND Inhale 1.5-3 L into the lungs continuous.  06/17/2015: Received from: [facility] Received Sig:    No facility-administered encounter medications on file as of 03/17/2016.     Functional Status:  In your present state of health, do you have any difficulty performing the following activities: 11/11/2015 10/30/2015  Hearing? [name]  Vision? N N  Difficulty concentrating or making decisions? N N  Walking or climbing stairs? Y Y  Dressing or bathing? N N  Doing errands, shopping? [name]  Preparing Food and eating ? Y Y  Using the Toilet? N N  In the past six months, have you accidently leaked urine? N N  Do you have problems with loss of bowel control? N N  Managing your Medications? N N  Managing your Finances? N N  Housekeeping or managing your Housekeeping? [name]  Some recent data might be hidden    Fall/Depression Screening: PHQ 2/9 Scores 03/17/2016 02/01/2016 12/08/2015 11/11/2015 10/30/2015 01/14/2013  PHQ - 2 Score 0 0 0 0 0 2  PHQ- 9 Score - - - - - 3    Assessment: Patient continues to benefit from health coach outreach for disease management and support.    Plan:  [facility] CM Care Plan Problem One   Flowsheet  Row Most Recent Value  Care Plan Problem One  COPD knowledge deficit  Role Documenting the Problem One  Health Coach  Care Plan for Problem One  Active  [facility] Long Term Goal (31-90 days)  Patient will be able to explain COPD zones on action plan within the next 90 days   [facility] Long Term Goal Start Date  03/17/16 Staci Righter[goal continued]  Interventions for Problem One Long Term Goal  RN Health Coach reinforced with patient COPD action plan and purpose.    [facility] CM Short Term Goal #3 (0-30 days)  Patient will be able to name at least 3 of COPD yellow zone symptoms within 30 days.  [facility] CM Short Term Goal #3 Start Date  03/17/16 Staci Righter[goal continued]  Interventions for Short Tern Goal  #3  RN Health Coach reinforced with patient yellow zone of heart failure zone chart.        Bary Lericheionne J Isamar Nazir, RN, MSN [facility] Care Management RN Telephonic Health Coach [phone]

## 2016-04-06 ENCOUNTER — Encounter: Payer: Self-pay | Admitting: Emergency Medicine

## 2016-04-06 ENCOUNTER — Ambulatory Visit (INDEPENDENT_AMBULATORY_CARE_PROVIDER_SITE_OTHER): Payer: PPO | Admitting: Emergency Medicine

## 2016-04-06 DIAGNOSIS — Z23 Encounter for immunization: Secondary | ICD-10-CM

## 2016-04-06 DIAGNOSIS — J449 Chronic obstructive pulmonary disease, unspecified: Secondary | ICD-10-CM | POA: Diagnosis not present

## 2016-04-06 NOTE — Progress Notes (Signed)
Subjective:     Patient ID: Sheri SleeperHazel H Janowiak, female   DOB: 1934-09-06, 81 y.o.   MRN: [id number]  HPI 81 yo woman, former smoker (50 pk-yrs), hx COPD, HTN and diastolic CHF, PVD. She was admitted for AE-COPD 10/7-10/11. CT scan during that admission showed scattered LL nodular disease. She returns for f/u, has been taking symbicort and spiriva. This was her 2nd hospitalization for AE. She is now feeling almost back to baseline, still with DOE - hasn't been shopping by herself. Her FEV1 was 0.57L in 07/2010. Very little cough, sputum. She does occasionally wheeze.  She uses SABA several times a week.   ROV 08/07/12 -- Follows up for her severe COPD, associated hypoxemia, frequent exacerbations. Has been managed on Spiriva + Symbicort. O2 2L/min pulsed w exertion >> she increased to 3L/min on her own. Also LL pulm nodular dizease by CT scan, due for repeat April 2014. She began to have chest tightness, cough, chest burning over the weekend (4-5 days ago). She started an old script of pred on her own > a taper that she had from before. Her chest tightness and breathing have improved. Rare cough, clear mucous.   08/17/12 Follow up  Present for a follow up for COPD flare .  Pt saw Cardiology on Tuesday-2/4  and they ordered avelox and prednisone. Pt states  cough is improved, but she is still having  increased SOB with rest and minimal activity. She denies any hemoptysis, orthopnea, PND, or leg swelling Chest x-ray is without acute process.  ROV 09/13/12 -- Follows up for her severe COPD, associated hypoxemia, frequent exacerbations. Was treated for an AE-COPD last month with avelox and pred taper. She is feeling better - back to baseline. Wearing o2 at 2-3L/min. Using a walker to ambulate. No wheeze or sputum. Using SABA 2-3x a day.   ROV 11/08/12 -- severe COPD, associated hypoxemia, frequent exacerbations. Also with scattered B pulm nodules identified on CT scan 04/2012 predominantly RUL. She returns today after  CT scan 4/4 >> stable. She is asking about the coronary plaque seen on the CT scan. She denies CP.  She is stable on spiriva + symbicort. No exacerbations. No new sx  ROV 02/07/13 -- severe COPD, associated hypoxemia, frequent exacerbations. Also with scattered B pulm nodules identified on CT scan 04/2012 predominantly RUL, stable 10/12/12. She has been doing well, no flares. Uses albuterol most days. She wears o2 with exertion, 2l/min.   ROV 05/06/13 -- severe COPD, associated hypoxemia, frequent exacerbations. [facility] Also with scattered B pulm nodules identified on CT scan 04/2012 predominantly RUL, stable 10/12/12. Repeat CT done 05/02/13 shows stability of nodules, new irregular area of GG and consolidation.  She has been well, denies any PNA or exacerbations. No cough. She does have exertional dyspnea. Uses SABA 1-2x a day.   ROV 08/16/13 -- severe COPD, associated hypoxemia, frequent exacerbations. Pulmonary nodules and some GGI being followed by CT scan.  She started having some dry cough 1/28, was seen by her PCP and started Augmentin, has 2 more days to go. Was not treated with pred. Remains on symbicort, spiriva  ROV 09/13/13 -- severe COPD, associated hypoxemia, frequent exacerbations. We have also ben following some pulm nodules and GGI, last CT scan chest was 08/05/13 and showed stability. She need repeat CT in October. She has recovered from recent AE, treated with augmentin and pred.   ROV 05/13/14 -- follow up visit for severe COPD,  associated hypoxemia, pulmonary nodules. Her CT on 10/29 shows that her previously identified pulmonary nodules are unchanged and have been stable since 04/18/12 consistent with benign etiology. There is a new 1 cm sub-solid nodule in the right upper lobe. She tells me today that her exertion is limited. She is on 2L/min pulsed. Remains on spiriva and symbicort - the Anoro did not help as much.   ROV 12/02/14 --  Follow for COPD,  hypoxemia. She has pulm nodules on CT chest. PAULAs test since last time was reassuring. CT chest from 5/18 reviewed by me > the new sub solid RUL nodule is gone, all the other nodules are stable for > 2 years. She has been having more exertional dyspnea, the sensation that she cannot get a deep breath. We changed her oxygen to 3 L/m with exertion last time and she's been reliable with this. No real change in coughing or wheezing. No acute exacerbations  ROV 05/12/15 -- follow-up visit for COPD, hypoxemia. She has also had pulmonary nodules that we have followed with serial CT scans and which have been benign for greater than 2 years. She underwent PAULAs testing that was reassuring. At her last version we change Spiriva to respimat. She is also on Symbicort. She is using oxygen at 3 L/m. She is having dyspnea w exertion, is doing a bit less.   No cough, occasional wheeze. She is quite limited, can walk through the house. She believes that she is using her albuterol more.   ROV 01/05/16 -- Mrs. Dopson has a history of severe COPD and chronic hypoxemic respiratory failure. She also has a history of pulmonary nodules that we've followed with serial CT scans and deemed benign based on their stability. She is currently managed on Spiriva respimat, Symbicort. She was admitted in 3/17 for AE-COPD and resp failure. She believes that her breathing has improved. She has been dealing with labile BP, HTN. She is using O2 at 3L/min w exertion.      ROV 04/06/16 -- visit for history of severe COPD, chronic hypoxemic respiratory failure, pulmonary nodules were benign on serial scans. She uses Spiriva and Symbicort. She is on o2 at 3L/min. No flares since last time. She had a bad reaction to pneumovax in the past.    CAT Score 05/12/2015 05/13/2014 09/13/2012 08/07/2012  Total CAT Score 18 18 17 16        Objective:   Physical Exam Vitals:   04/06/16 1519  BP: 110/60  BP Location: Left Arm  Cuff Size: Normal  Pulse: 72   SpO2: 96%  Weight: 120 lb (54.4 kg)  Height: 5\' 1"  (1.549 m)   Gen: Pleasant, elderly woman, in no distress,  normal affect  ENT: No lesions,  mouth clear,  oropharynx clear, no postnasal drip  Neck: No JVD, no TMG, no carotid bruits  Lungs: No use of accessory muscles, distant, diminished BS in bases, no wheeze  Cardiovascular: RRR, heart sounds normal, no murmur or gallops, no peripheral edema  Musculoskeletal: No deformities, no cyanosis or clubbing  Neuro: alert, non focal  Skin: Warm, no lesions or rashes    CT scan chest , personally reviewed by me 11/26/14 --  COMPARISON: CT 05/08/2014 04/18/2012  FINDINGS: Mediastinum/Nodes: No axillary supraclavicular lymphadenopathy. No mediastinal hilar lymphadenopathy no pericardial fluid. Esophagus is normal.  Lungs/Pleura: The new sub solid nodule described on comparison CT is no longer measurable.  Additional solid nodules are not changed. For example 7 mm nodule in the right upper  lobe on image 27, series 3 is unchanged. Adjacent 6 mm nodule is also unchanged (image 26).  More superiorly in the right upper lobe band of linear nodular thickening measures 5 mm compared to 7 mm (image 11, series 3. This right upper lobe linear nodularity first appear CT of 05/02/2013 and has lengthed and thinned.  There is additional scattered sub 5 mm nodules, stable. 3 mm left upper lobe nodule image 17, unchanged  Upper abdomen: Limited view of the liver, kidneys, pancreas are unremarkable. Normal adrenal glands.  Musculoskeletal: No aggressive osseous lesion.  IMPRESSION: 1. Resolution of right upper lobe sub solid nodule. 2. Stable bilateral noncalcified nodules for greater than 2 years. 3. Band of linear nodular thickening at the right lung apex first presented on CT of 05/02/2013. This nodular thickening appears to continue to decrease in volume. Consider one additional follow-up for this right upper lobe bandlike  nodular thickening in 6-12 months       Assessment:     COPD (chronic obstructive pulmonary disease) (HCC) Please continue your Spiriva and Symbicort Wear oxygen at 3L/min Flu shot today.  Keep albuterol available to use 2 puffs up to every 4 hours if needed for shortness of breath.  Follow with Dr Delton CoombesByrum in 4 months or sooner if you have any problems.  Please continue your Spiriva and Symbicort Wear oxygen at 3L/min Flu shot today.  Keep albuterol available to use 2 puffs up to every 4 hours if needed for shortness of breath.  Follow with Dr Delton CoombesByrum in 4 months or sooner if you have any problems.   Levy Pupaobert Lexia Vandevender, MD, PhD 04/06/2016, 5:17 PM [facility] [phone] or if no answer [phone]

## 2016-04-06 NOTE — Patient Instructions (Signed)
Please continue your Spiriva and Symbicort Wear oxygen at 3L/min Flu shot today.  Keep albuterol available to use 2 puffs up to every 4 hours if needed for shortness of breath.  Follow with Dr Nayshawn Mesta in 4 months or sooner if you have any problems. 

## 2016-04-06 NOTE — Assessment & Plan Note (Signed)
Please continue your Spiriva and Symbicort Wear oxygen at 3L/min Flu shot today.  Keep albuterol available to use 2 puffs up to every 4 hours if needed for shortness of breath.  Follow with Dr Delton CoombesByrum in 4 months or sooner if you have any problems.

## 2016-04-12 ENCOUNTER — Other Ambulatory Visit: Payer: Self-pay

## 2016-04-12 NOTE — Patient Outreach (Signed)
[facility](THN) Care Management  04/12/2016  Terald SleeperHazel H Helfand [birth year] [id number]   Telephone call to patient for monthly call.  No answer.  HIPAA compliant voice message left.  Plan: RN Health Coach will attempt patient again in the month of October.    Bary Lericheionne J Evans Levee, RN, MSN [facility] Care [facility] RN Telephonic Health Coach [phone]

## 2016-04-12 NOTE — Patient Outreach (Signed)
[facility]N) Care Management  [facility]  04/12/2016   Terald SleeperHazel H Beske 07-22-1934 [id number]  Subjective: Incoming call from patient for monthly call. Patient reports she is doing well. She reports that she is still not needing her oxygen at night and she is thrilled about that. Patient reports that her breathing is good and that she had her flu vaccine last week.  Reviewed with patient COPD action plan and when to notify physician.  She verbalized understanding.  No concerns.    Objective:   Encounter Medications:  Outpatient Encounter Prescriptions as of 04/12/2016  Medication Sig Note  . albuterol (PROVENTIL HFA;VENTOLIN HFA) 108 (90 BASE) MCG/ACT inhaler Inhale 2 puffs into the lungs every 6 (six) hours as needed. For shortness of breath   . aspirin 81 MG tablet Take 81 mg by mouth daily.     Marland Kitchen. aspirin-acetaminophen-caffeine (EXCEDRIN MIGRAINE) 250-250-65 MG tablet Take 1 tablet by mouth every 6 (six) hours as needed for headache.   Marland Kitchen. atorvastatin (LIPITOR) 10 MG tablet Take 10 mg by mouth daily.     . budesonide-formoterol (SYMBICORT) 160-4.5 MCG/ACT inhaler INHALE 2 PUFFS INTO THE LUNGS 2 (TWO) TIMES DAILY.   . Calcium Carb-Cholecalciferol (CALCIUM 600 + D PO) Take 1 tablet by mouth daily.   . clonazePAM (KLONOPIN) 0.5 MG tablet Take 0.5 tablets (0.25 mg total) by mouth 2 (two) times daily.   . furosemide (LASIX) 40 MG tablet Take 40mg  in the morning and 20mg  in the afternoon daily   . losartan (COZAAR) 25 MG tablet Take 1 tablet (25 mg total) by mouth daily.   . Magnesium 500 MG CAPS Take 1 capsule by mouth daily.   . Multiple Vitamin (MULTIVITAMIN) tablet Take 1 tablet by mouth daily.     . nebivolol (BYSTOLIC) 5 MG tablet Take 1 tablet (5 mg total) by mouth daily.   . niacin (NIASPAN) 1000 MG CR tablet Take 1,000 mg by mouth daily.   . potassium chloride (K-DUR) 10 MEQ tablet Take 10 mEq by mouth 2 (two) times daily.   . Tiotropium Bromide Monohydrate  (SPIRIVA RESPIMAT) 2.5 MCG/ACT AERS INHALE 2 PUFFS INTO THE LUNGS DAILY.   Marland Kitchen. UNABLE TO FIND Inhale 1.5-3 L into the lungs continuous.  [facility]17/2015: [facility] Sig:    No facility-administered encounter medications on file as of 04/12/2016.     Functional Status:  In your present state of health, do you have any difficulty performing the following activities: 11/11/2015 10/30/2015  Hearing? Malvin JohnsY Y  Vision? N N  Difficulty concentrating or making decisions? N N  Walking or climbing stairs? Y Y  Dressing or bathing? N N  Doing errands, shopping? Malvin JohnsY Y  Preparing Food and eating ? Y Y  Using the Toilet? N N  In the past six months, have you accidently leaked urine? N N  Do you have problems with loss of bowel control? N N  Managing your Medications? N N  Managing your Finances? N N  Housekeeping or managing your Housekeeping? Malvin JohnsY Y  Some recent data might be hidden    Fall/Depression Screening: PHQ 2/9 Scores 04/12/2016 03/17/2016 02/01/2016 12/08/2015 11/11/2015 10/30/2015 01/14/2013  PHQ - 2 Score 0 0 0 0 0 0 2  PHQ- 9 Score - - - - - - 3    Assessment: Patient continues to benefit from health coach outreach for disease management and support.    Plan:  [facility] Problem [facility]  Flowsheet Row Most Recent Value  Care Plan Problem One  COPD knowledge deficit  Role Documenting the Problem One  Health Coach  Care Plan for Problem One  Active  THN Long Term Goal (31-90 days)  Patient will be able to explain COPD zones on action plan within the next 90 days   THN Long Term Goal Start Date  03/17/16 Staci Righter continued]  Interventions for Problem One Long Term Goal  RN Health Coach reviewed with patient COPD action plan and purpose.    THN CM Short Term Goal #3 (0-30 days)  Patient will be able to name at least 3 of COPD yellow zone symptoms within 30 days.  THN CM Short Term Goal #3 Start Date  04/12/16 [goal continued]  Interventions for Short Tern Goal  #3  RN Health Coach reviewed with patient yellow zone of heart failure zone chart.       RN Health Coach will contact patient in the month of November and patient agrees to next outreach.  Bary Leriche, RN, MSN [facility] Care Management RN Telephonic Health Coach [phone]

## 2016-04-13 ENCOUNTER — Encounter: Payer: Self-pay | Admitting: Cardiology

## 2016-04-27 ENCOUNTER — Ambulatory Visit (INDEPENDENT_AMBULATORY_CARE_PROVIDER_SITE_OTHER): Payer: PPO | Admitting: Cardiology

## 2016-04-27 ENCOUNTER — Encounter: Payer: Self-pay | Admitting: Cardiology

## 2016-04-27 VITALS — BP 120/68 | HR 85 | Ht 61.0 in | Wt 121.4 lb

## 2016-04-27 DIAGNOSIS — I5033 Acute on chronic diastolic (congestive) heart failure: Secondary | ICD-10-CM | POA: Diagnosis not present

## 2016-04-27 DIAGNOSIS — R0609 Other forms of dyspnea: Secondary | ICD-10-CM | POA: Diagnosis not present

## 2016-04-27 DIAGNOSIS — J449 Chronic obstructive pulmonary disease, unspecified: Secondary | ICD-10-CM

## 2016-04-27 DIAGNOSIS — R0600 Dyspnea, unspecified: Secondary | ICD-10-CM

## 2016-04-27 MED ORDER — FUROSEMIDE 40 MG PO TABS: ORAL_TABLET | ORAL | 3 refills | Status: DC

## 2016-04-27 NOTE — Progress Notes (Addendum)
CARDIOLOGY OFFICE NOTE  Date:  04/28/2016    [name] Date of Birth: March 07, 1935 Medical Record #[id number]  PCP:  [name], MD  Cardiologist:  Delton See  Chief complain: 2 months follow up  History of Present Illness: [name] is a 81 y.o. female who presents today for a 2 week check. Seen for Dr. Delton See.   She has a hx of PAD, HTN, COPD. She's had a question of infrarenal abdominal aorta dilatation on CT in the past. Subsequent ultrasounds have been negative for aneurysm. She last saw Dr. Daleen Squibb 8/12 after undergoing a "Life Screening Survey". This demonstrated mild carotid artery stenosis bilaterally (no percentage given) and mildly reduced ABIs bilaterally (0.9). Recommendation was to repeat her carotid Dopplers in 02/2013. Admitted 04/2012 with a COPD exacerbation. She was followed by pulmonary/critical care. Chest CT was negative for pulmonary embolism. She did have multiple lung nodules noted and needs followup CT in 3-6 months. Echocardiogram 04/16/12: Mild focal basal septal hypertrophy, EF 70-75% with dynamic obstruction with mid cavity obliteration, grade 1 diastolic dysfunction, MAC, atrial septal thickening with findings consistent with lipomatous hypertrophy.  Dr. Delton See saw her 05/09/12 and titrated her beta blocker given her hypertrophic cardiomyopathy to slow her HR some. She is on chronic O2. Chronic dyspnea. She saw her again on 07/23/12 and she complained of chest pain. Of note, she had coronary calcifications on chest CT in 04/2012. Dobutamine Myoview 08/06/12: No ischemia, EF 85%.   The patient was hospitalized March 1 of 09/15/2015 for acute on chronic COPD exacerbation with pneumonia. With hypoxia and hypercarbia. Her troponin was elevated 0.61 with no acute EKG findings, she was treated for acute diastolic CHF exacerbation as well, weight at admission was 134 pounds and at discharge it was 125. BNP on 3/1 was 58.0.   She saw Dr. Delton See 10/05/15  with worsening dyspnea on exertion that was felt multifactorial secondary to COPD and hypertrophic cardiomyopathy. She recommended continuing beta blockers. She felt if her symptoms continued to deteriorate she might consider left heart catheterization.  Seen back 2 weeks ago - more short of breath and more swelling. Using oxygen constantly. BP elevated. She was started on Losartan and had her Lasix increased.   Comes in today. Here with her husband. She is in a wheelchair. She remains short of breath - this is chronic. Very limited with her activities. Really does not go out of the house very much. She feels ok for the most part and seems to be at her baseline. No chest pain. On her oxygen all the time. Husband is asking about if an EP consult would be helpful but she does not have any EP issues. Not swelling. Weight seems stable. Don't really see where extra diuretics have helped. She is for labs today. Her BP has improved with the low dose ARB therapy.   04/27/2016 - 2 months follow up, she feels better, continues to use 3 L of home O2, not at night anymore. She is able to walk and is not using a wheelchair anymore. Lower extremity edema has improved, no orthopna or PND. No chest pain.  Past Medical History:  Diagnosis Date  . Actinomycosis, cervicofacial   . CAD (coronary artery disease)    a. coronary calcification on prior Chest CT;  b. Dob MV 1/14:  EF 85%, no ischemia  . COPD (chronic obstructive pulmonary disease) (HCC)   . Emphysema   . HTN (hypertension)   . Hx of echocardiogram  Echocardiogram 04/16/12: Mild focal basal septal hypertrophy, EF 70-75% with dynamic obstruction with mid cavity obliteration, grade 1 diastolic dysfunction, MAC, atrial septal thickening with findings consistent with lipomatous hypertrophy  . Hyperlipidemia   . PVD (peripheral vascular disease) (HCC)     Past Surgical History:  Procedure Laterality Date  . APPENDECTOMY    . BONE GRAFT HIP ILIAC CREST     . TONSILLECTOMY       Medications: Current Outpatient Prescriptions  Medication Sig Dispense Refill  . albuterol (PROVENTIL HFA;VENTOLIN HFA) 108 (90 BASE) MCG/ACT inhaler Inhale 2 puffs into the lungs every 6 (six) hours as needed. For shortness of breath    . aspirin 81 MG tablet Take 81 mg by mouth daily.      Marland Kitchen. aspirin-acetaminophen-caffeine (EXCEDRIN MIGRAINE) 250-250-65 MG tablet Take 1 tablet by mouth every 6 (six) hours as needed for headache.    Marland Kitchen. atorvastatin (LIPITOR) 10 MG tablet Take 10 mg by mouth daily.      . budesonide-formoterol (SYMBICORT) 160-4.5 MCG/ACT inhaler INHALE 2 PUFFS INTO THE LUNGS 2 (TWO) TIMES DAILY. 3 Inhaler 3  . Calcium Carb-Cholecalciferol (CALCIUM 600 + D PO) Take 1 tablet by mouth daily.    . clonazePAM (KLONOPIN) 0.5 MG tablet Take 0.5 tablets (0.25 mg total) by mouth 2 (two) times daily. 60 tablet 0  . furosemide (LASIX) 40 MG tablet Take 40mg  in the morning and 20mg  in the afternoon daily 140 tablet 3  . losartan (COZAAR) 25 MG tablet Take 1 tablet (25 mg total) by mouth daily. 90 tablet 3  . Magnesium 500 MG CAPS Take 1 capsule by mouth daily.    . Multiple Vitamin (MULTIVITAMIN) tablet Take 1 tablet by mouth daily.      . nebivolol (BYSTOLIC) 5 MG tablet Take 1 tablet (5 mg total) by mouth daily. 90 tablet 3  . niacin (NIASPAN) 1000 MG CR tablet Take 1,000 mg by mouth daily.    . potassium chloride (K-DUR) 10 MEQ tablet Take 10 mEq by mouth 2 (two) times daily.    . Tiotropium Bromide Monohydrate (SPIRIVA RESPIMAT) 2.5 MCG/ACT AERS INHALE 2 PUFFS INTO THE LUNGS DAILY. 3 Inhaler 3  . UNABLE TO FIND Inhale 1.5-3 L into the lungs continuous.      No current facility-administered medications for this visit.     Allergies: Allergies  Allergen Reactions  . Penicillins Hives    Has taken it since.  . Pneumococcal Vaccine Swelling    Severe localized swelling in arm  . Pneumovax [Pneumococcal Polysaccharide Vaccine] Hives    Social  History: The patient  reports that she quit smoking about 10 years ago. Her smoking use included Cigarettes. She has a 49.00 pack-year smoking history. She has never used smokeless tobacco. She reports that she does not drink alcohol or use drugs.   Family History: The patient's family history includes Coronary artery disease in her brother; Emphysema in her father, sister, sister, and sister; Heart disease in her father and mother; Stomach cancer in her father and sister.   Review of Systems: Please see the history of present illness.   Otherwise, the review of systems is positive for none.   All other systems are reviewed and negative.   Physical Exam: VS:  BP 120/68   Pulse 85   Ht 5\' 1"  (1.549 m)   Wt 121 lb 6.4 oz (55.1 kg)   SpO2 99%   BMI 22.94 kg/m  .  BMI Body mass index is 22.94 kg/m.  Wt Readings from Last 3 Encounters:  04/27/16 121 lb 6.4 oz (55.1 kg)  04/06/16 120 lb (54.4 kg)  02/10/16 120 lb (54.4 kg)    General: Pleasant.  Chronically ill appearing. She is alert and in no acute distress. She has oxygen in place. In a wheelchair.  HEENT: Normal.  Neck: Supple, no JVD, carotid bruits, or masses noted.  Cardiac: Heart tones are distant. No edema.  Respiratory:  Lungs with decreased breath sounds and mildly increased work of breathing.  GI: Soft and nontender.  MS: No deformity or atrophy. Gait not tested Skin: Warm and dry. Color is normal.  Neuro:  Strength and sensation are intact and no gross focal deficits noted.  Psych: Alert, appropriate and with normal affect.   LABORATORY DATA:  EKG:  EKG is not ordered today.   Lab Results  Component Value Date   WBC 8.0 12/07/2015   HGB 13.8 12/07/2015   HCT 42.9 12/07/2015   PLT 319 12/07/2015   GLUCOSE 100 (H) 02/10/2016   CHOL 213 12/16/2008   TRIG 166 12/16/2008   HDL 62 12/16/2008   LDLCALC 116 12/16/2008   ALT 17 02/10/2016   AST 14 02/10/2016   NA 140 02/10/2016   K 4.6 02/10/2016   CL 99  02/10/2016   CREATININE 0.83 02/10/2016   BUN 25 02/10/2016   CO2 31 02/10/2016   TSH 0.603 09/13/2015   INR 0.91 09/09/2015    BNP (last 3 results)  Recent Labs  09/09/15 0255 10/05/15 1159 02/10/16 1615  BNP 58.0 20.1 15.7    ProBNP (last 3 results) No results for input(s): PROBNP in the last 8760 hours.   Other Studies Reviewed Today:   TTE: 09/15/2015 Left ventricle: The cavity size was normal. There was mild focal  basal hypertrophy of the septum. Systolic function was vigorous.  The estimated ejection fraction was in the range of 65% to 70%.  Wall motion was normal; there were no regional wall motion  abnormalities. Doppler parameters are consistent with abnormal  left ventricular relaxation (grade 1 diastolic dysfunction).  Myoview Impression from 02/2014 Exercise Capacity:  Lexiscan with no exercise. BP Response:  Hypertensive blood pressure response. Clinical Symptoms:  There is dyspnea. ECG Impression:  No significant ST segment change suggestive of ischemia. Comparison with Prior Nuclear Study: Compared to 08/06/12, no change.  Overall Impression:  Normal stress nuclear study.  LV Ejection Fraction: 71%.  LV Wall Motion:  NL LV Function; NL Wall Motion  Olga Millers   Assessment/Plan:  1. Chronic dyspnea - I suspect this is multifactorial and more related to her lung disease. She seems to be at her baseline.   2. Hypertrophic CM - tolerating nebivolol  3. Acute on chronic diastolic HF - BNP at the last visit 15, she is euvolemic.   4. COPD - I suspect this is her primary problem - she is on chronic oxygen therapy  5. HTN - controlled on current regimen.   6. Hyperlipidemia - she would like to d/c niacin, I will obtain lipids from her PCP and reevaluate   Current medicines are reviewed with the patient today.  The patient does not have concerns regarding medicines other than what has been noted above.  The following changes have been  made:  See above.  Labs/ tests ordered today include:   No orders of the defined types were placed in this encounter.   Signed: Tobias Alexander, MD 04/28/2016 6:31 AM  [facility] [phone]  [address] [place], [place]  [address] Phone: [phone] Fax: [phone]

## 2016-04-27 NOTE — Patient Instructions (Signed)
Medication Instructions:   Your physician recommends that you continue on your current medications as directed. Please refer to the Current Medication list given to you today.  *WE HAVE REQUESTED LABS FROM YOUR PCP--DR NELSON WILL REVIEW THIS AND WILL ADVISE IF NECESSARY--WILL FOLLOW-UP WITH YOU ACCORDINGLY*    Follow-Up:  3 MONTHS WITH DR Delton SeeNELSON       If you need a refill on your cardiac medications before your next appointment, please call your pharmacy.

## 2016-04-28 ENCOUNTER — Encounter: Payer: Self-pay | Admitting: *Deleted

## 2016-04-28 ENCOUNTER — Encounter: Payer: Self-pay | Admitting: Cardiology

## 2016-04-29 ENCOUNTER — Telehealth: Payer: Self-pay | Admitting: Cardiology

## 2016-04-29 MED ORDER — ATORVASTATIN CALCIUM 20 MG PO TABS: 20.0000 mg | ORAL_TABLET | Freq: Every day | ORAL | 3 refills | Status: AC

## 2016-04-29 NOTE — Telephone Encounter (Signed)
PT RETURNED CALL TO [facility] CALL [phone]

## 2016-04-29 NOTE — Telephone Encounter (Signed)
-----   Message from Lars MassonKatarina H Nelson, MD sent at 04/28/2016  1:11 PM EDT ----- I reviewed patient's labs, she has normal triglycerides and elevated LDL, based on those results I will discontinue niacin and increase atorvastatin to 20 mg daily.

## 2016-04-29 NOTE — Telephone Encounter (Signed)
Notified the pt that per Dr Delton SeeNelson, she reviewed her labs sent from her PCP's office and has recommendations.  Informed the pt that per Dr Delton SeeNelson, she states that she has normal triglycerides and elevated LDL.  Informed the pt that Dr Delton SeeNelson recommends that we discontinue her Niacin, and increase her atorvastatin to 20 mg po daily.  Confirmed the pharmacy of choice with the pt.  Pt verbalized understanding and agrees with this plan.

## 2016-05-05 ENCOUNTER — Ambulatory Visit: Payer: Self-pay

## 2016-05-12 ENCOUNTER — Other Ambulatory Visit: Payer: Self-pay

## 2016-05-12 NOTE — Patient Outreach (Signed)
[facility]) Care Management  [place]  05/12/2016   [name] November 05, 1934 [id number]  Subjective: Telephone call to patient for monthly call.  Patient reports she is doing ok but having some swelling to ankles but patient admits to not taking lasix on Monday due to trip. Discussed with patient taking medication and heart failure signs.  She verbalized understanding.  She reports that her breathing has been ok.  Discussed with patient COPD action plan and when to notify physician.  She verbalized understanding.    Objective:   Encounter Medications:  Outpatient Encounter Prescriptions as of 05/12/2016  Medication Sig Note  . albuterol (PROVENTIL HFA;VENTOLIN HFA) 108 (90 BASE) MCG/ACT inhaler Inhale 2 puffs into the lungs every 6 (six) hours as needed. For shortness of breath   . aspirin 81 MG tablet Take 81 mg by mouth daily.     Marland Kitchen aspirin-acetaminophen-caffeine (EXCEDRIN MIGRAINE) 250-250-65 MG tablet Take 1 tablet by mouth every 6 (six) hours as needed for headache.   Marland Kitchen atorvastatin (LIPITOR) 20 MG tablet Take 1 tablet (20 mg total) by mouth daily.   . budesonide-formoterol (SYMBICORT) 160-4.5 MCG/ACT inhaler INHALE 2 PUFFS INTO THE LUNGS 2 (TWO) TIMES DAILY.   . Calcium Carb-Cholecalciferol (CALCIUM 600 + D PO) Take 1 tablet by mouth daily.   . clonazePAM (KLONOPIN) 0.5 MG tablet Take 0.5 tablets (0.25 mg total) by mouth 2 (two) times daily.   . furosemide (LASIX) 40 MG tablet Take 6m in the morning and 262min the afternoon daily   . losartan (COZAAR) 25 MG tablet Take 1 tablet (25 mg total) by mouth daily.   . Magnesium 500 MG CAPS Take 1 capsule by mouth daily.   . Multiple Vitamin (MULTIVITAMIN) tablet Take 1 tablet by mouth daily.     . nebivolol (BYSTOLIC) 5 MG tablet Take 1 tablet (5 mg total) by mouth daily.   . potassium chloride (K-DUR) 10 MEQ tablet Take 10 mEq by mouth 2 (two) times daily.   . Tiotropium Bromide Monohydrate (SPIRIVA  RESPIMAT) 2.5 MCG/ACT AERS INHALE 2 PUFFS INTO THE LUNGS DAILY.   . Marland KitchenNABLE TO FIND Inhale 1.5-3 L into the lungs continuous.  05/12/2016: Patient using oxygen as needed   No facility-administered encounter medications on file as of 05/12/2016.     Functional Status:  In your present state of health, do you have any difficulty performing the following activities: 11/11/2015 10/30/2015  Hearing? Y Tempie DonningVision? N N  Difficulty concentrating or making decisions? N N  Walking or climbing stairs? Y Y  Dressing or bathing? N N  Doing errands, shopping? Y Tempie DonningPreparing Food and eating ? Y Y  Using the Toilet? N N  In the past six months, have you accidently leaked urine? N N  Do you have problems with loss of bowel control? N N  Managing your Medications? N N  Managing your Finances? N N  Housekeeping or managing your Housekeeping? Y Tempie DonningSome recent data might be hidden    Fall/Depression Screening: PHQ 2/9 Scores 05/12/2016 04/12/2016 03/17/2016 02/01/2016 12/08/2015 11/11/2015 10/30/2015  PHQ - 2 Score 0 0 0 0 0 0 0  PHQ- 9 Score - - - - - - -    Assessment: Patient continues to benefit from health coach outreach for disease management and support.    Plan:  [facility] Problem [facility]   Flowsheet Row Most Recent Value  Care Plan Problem One  COPD knowledge  deficit  Role Documenting the Problem One  [place] for Problem One  Active  THN Long Term Goal (31-90 days)  Patient will be able to explain COPD zones on action plan within the next 90 days   THN Long Term Goal Start Date  05/12/16 Barrie Folk continued]  Interventions for Problem One Long Term Goal  RN Health Coach discussed with patient COPD action plan and purpose.    THN CM Short Term Goal #1 (0-30 days)  Patient will be able to describe COPD red zone within 30 days.    THN CM Short Term Goal #1 Start Date  05/12/16  Interventions for Short Term Goal #1  RN Health Coach discussed with patient red zone of COPD action plan.     THN CM Short Term Goal #3 (0-30 days)  Patient will be able to name at least 3 of COPD yellow zone symptoms within 30 days.  THN CM Short Term Goal #3 Start Date  04/12/16  [facility] CM Short Term Goal #3 Met Date  05/12/16  Interventions for Short Tern Goal #3  Patient able to describe yellow zone of COPD action plan.        RN Health Coach will contact patient in the month of December and patient agrees to next outreach.  Jone Baseman, RN, MSN [place] [phone]

## 2016-06-06 DIAGNOSIS — J449 Chronic obstructive pulmonary disease, unspecified: Secondary | ICD-10-CM | POA: Diagnosis not present

## 2016-06-06 DIAGNOSIS — I509 Heart failure, unspecified: Secondary | ICD-10-CM | POA: Diagnosis not present

## 2016-06-06 DIAGNOSIS — E7870 Disorder of bile acid and cholesterol metabolism, unspecified: Secondary | ICD-10-CM | POA: Diagnosis not present

## 2016-06-06 DIAGNOSIS — Z6823 Body mass index (BMI) 23.0-23.9, adult: Secondary | ICD-10-CM | POA: Diagnosis not present

## 2016-06-10 ENCOUNTER — Other Ambulatory Visit: Payer: Self-pay

## 2016-06-10 NOTE — Patient Outreach (Signed)
[facility](THN) Care Management  06/10/2016  Sheri SleeperHazel H Mcdonald 01-30-1935 [id number]   Telephone call to patient for monthly call. No answer. HIPAA compliant voice message left.  Plan: RN Health Coach will contact patient again in the month of December.    Bary Leriche.Larue Lightner J Josslyn Ciolek, RN, MSN [facility] Care [facility] RN Telephonic Health Coach [phone]

## 2016-06-24 ENCOUNTER — Encounter: Payer: Self-pay | Admitting: Cardiology

## 2016-06-27 ENCOUNTER — Other Ambulatory Visit: Payer: Self-pay

## 2016-06-27 NOTE — Patient Outreach (Signed)
[facility](THN) Care Management  06/27/2016  [name] 30-Jul-1934 [id number]   2nd Telephone call to patient for monthly call.  No answer. HIPAA compliant voice message left.  Plan: RN Health Coach will attempt patient again in the month of December.    Bary Lericheionne J Iwao Shamblin, RN, MSN [facility] Care Management RN Telephonic Health Coach [phone]

## 2016-07-05 ENCOUNTER — Other Ambulatory Visit: Payer: Self-pay

## 2016-07-05 NOTE — Patient Outreach (Signed)
[facility]) Care Management  [place]  07/05/2016   AAIRAH NEGRETTE 09/28/1934 [id number]  Subjective: Telephone call to patient for monthly call. Patient reports that she has some increased shortness of breath today.  Patient reports she has some good and bad days and today is one of her bad ones. Patient feels that her shortness of breath is manageable with rest and her medications.  Patient reports that she is using her oxygen most of the time now.  Reiterated with patient COPD action plan and when to notify physician.  She verbalized understanding.   Objective:   Encounter Medications:  Outpatient Encounter Prescriptions as of 07/05/2016  Medication Sig Note  . albuterol (PROVENTIL HFA;VENTOLIN HFA) 108 (90 BASE) MCG/ACT inhaler Inhale 2 puffs into the lungs every 6 (six) hours as needed. For shortness of breath   . aspirin 81 MG tablet Take 81 mg by mouth daily.     Marland Kitchen aspirin-acetaminophen-caffeine (EXCEDRIN MIGRAINE) 250-250-65 MG tablet Take 1 tablet by mouth every 6 (six) hours as needed for headache.   Marland Kitchen atorvastatin (LIPITOR) 20 MG tablet Take 1 tablet (20 mg total) by mouth daily.   . budesonide-formoterol (SYMBICORT) 160-4.5 MCG/ACT inhaler INHALE 2 PUFFS INTO THE LUNGS 2 (TWO) TIMES DAILY.   . Calcium Carb-Cholecalciferol (CALCIUM 600 + D PO) Take 1 tablet by mouth daily.   . clonazePAM (KLONOPIN) 0.5 MG tablet Take 0.5 tablets (0.25 mg total) by mouth 2 (two) times daily.   . furosemide (LASIX) 40 MG tablet Take 68m in the morning and 237min the afternoon daily   . losartan (COZAAR) 25 MG tablet Take 1 tablet (25 mg total) by mouth daily.   . Magnesium 500 MG CAPS Take 1 capsule by mouth daily.   . Multiple Vitamin (MULTIVITAMIN) tablet Take 1 tablet by mouth daily.     . nebivolol (BYSTOLIC) 5 MG tablet Take 1 tablet (5 mg total) by mouth daily.   . potassium chloride (K-DUR) 10 MEQ tablet Take 10 mEq by mouth 2 (two) times daily.   .  Tiotropium Bromide Monohydrate (SPIRIVA RESPIMAT) 2.5 MCG/ACT AERS INHALE 2 PUFFS INTO THE LUNGS DAILY.   . Marland KitchenNABLE TO FIND Inhale 1.5-3 L into the lungs continuous.  05/12/2016: Patient using oxygen as needed   No facility-administered encounter medications on file as of 07/05/2016.     Functional Status:  In your present state of health, do you have any difficulty performing the following activities: 11/11/2015 10/30/2015  Hearing? Y Tempie DonningVision? N N  Difficulty concentrating or making decisions? N N  Walking or climbing stairs? Y Y  Dressing or bathing? N N  Doing errands, shopping? Y Tempie DonningPreparing Food and eating ? Y Y  Using the Toilet? N N  In the past six months, have you accidently leaked urine? N N  Do you have problems with loss of bowel control? N N  Managing your Medications? N N  Managing your Finances? N N  Housekeeping or managing your Housekeeping? Y Tempie DonningSome recent data might be hidden    Fall/Depression Screening: PHQ 2/9 Scores 07/05/2016 05/12/2016 04/12/2016 03/17/2016 02/01/2016 12/08/2015 11/11/2015  PHQ - 2 Score 0 0 0 0 0 0 0  PHQ- 9 Score - - - - - - -    Assessment: Patient continues to benefit from health coach outreach for disease management and support.    Plan:  [facility]   Flowsheet Row Most Recent  Value  Care Plan Problem One  COPD knowledge deficit  Role Documenting the Problem One  Health Coach  Care Plan for Problem One  Active  [facility] Long Term Goal (31-90 days)  Patient will be able to explain COPD zones on action plan within the next 90 days   [facility] Long Term Goal Start Date  07/05/16 [goal continued]  Interventions for Problem One Long Term Goal  RN Health Coach reviewed with patient COPD action plan and purpose.    [facility] CM Short Term Goal #1 (0-30 days)  Patient will be able to describe COPD red zone within 30 days.    [facility] CM Short Term Goal #1 Start Date  05/12/16  [facility] CM Short Term Goal #1 Met Date  07/05/16  Interventions for Short  Term Goal #1  patient able to describe red zone.     RN Health Coach will contact patient in the month of January and patient agrees to next outreach.  Jone Baseman, RN, MSN [place] [phone]

## 2016-07-15 ENCOUNTER — Ambulatory Visit (INDEPENDENT_AMBULATORY_CARE_PROVIDER_SITE_OTHER): Payer: PPO | Admitting: Cardiology

## 2016-07-15 VITALS — BP 126/70 | HR 87 | Ht 61.0 in | Wt 122.0 lb

## 2016-07-15 DIAGNOSIS — I110 Hypertensive heart disease with heart failure: Secondary | ICD-10-CM | POA: Diagnosis not present

## 2016-07-15 DIAGNOSIS — I5033 Acute on chronic diastolic (congestive) heart failure: Secondary | ICD-10-CM | POA: Diagnosis not present

## 2016-07-15 MED ORDER — FUROSEMIDE 40 MG PO TABS: 40.0000 mg | ORAL_TABLET | Freq: Two times a day (BID) | ORAL | 3 refills | Status: DC

## 2016-07-15 NOTE — Patient Instructions (Addendum)
Medication Instructions:   TAKE LASIX 40 MG TWICE DAILY---TAKE 40 MG IN THE MORNING AROUND 8 AM AND THEN TAKE 40 MG AT 2 PM EVERYDAY    Lab work:  TODAY--BMET AND BNP    Follow-Up:  2 WEEKS WITH DR Delton SeeNELSON       If you need a refill on your cardiac medications before your next appointment, please call your pharmacy.

## 2016-07-15 NOTE — Progress Notes (Signed)
CARDIOLOGY OFFICE NOTE  Date:  07/15/2016    [name] Date of Birth: 12-15-1934 Medical Record #[id number]  PCP:  Velna Hatchet, MD  Cardiologist:  Meda Coffee  Chief complain: 2 months follow up  History of Present Illness: [name] is a 82 y.o. female who presents today for a 2 week check. Seen for Dr. Meda Coffee.   She has a hx of PAD, HTN, COPD. She's had a question of infrarenal abdominal aorta dilatation on CT in the past. Subsequent ultrasounds have been negative for aneurysm. She last saw Dr. Verl Blalock 8/12 after undergoing a "Life Screening Survey". This demonstrated mild carotid artery stenosis bilaterally (no percentage given) and mildly reduced ABIs bilaterally (0.9). Recommendation was to repeat her carotid Dopplers in 02/2013. Admitted 04/2012 with a COPD exacerbation. She was followed by pulmonary/critical care. Chest CT was negative for pulmonary embolism. She did have multiple lung nodules noted and needs followup CT in 3-6 months. Echocardiogram 04/16/12: Mild focal basal septal hypertrophy, EF 70-75% with dynamic obstruction with mid cavity obliteration, grade 1 diastolic dysfunction, MAC, atrial septal thickening with findings consistent with lipomatous hypertrophy.  Dr. Meda Coffee saw her 05/09/12 and titrated her beta blocker given her hypertrophic cardiomyopathy to slow her HR some. She is on chronic O2. Chronic dyspnea. She saw her again on 07/23/12 and she complained of chest pain. Of note, she had coronary calcifications on chest CT in 04/2012. Dobutamine Myoview 08/06/12: No ischemia, EF 85%.   The patient was hospitalized March 1 of 09/15/2015 for acute on chronic COPD exacerbation with pneumonia. With hypoxia and hypercarbia. Her troponin was elevated 0.61 with no acute EKG findings, she was treated for acute diastolic CHF exacerbation as well, weight at admission was 134 pounds and at discharge it was 125. BNP on 3/1 was 58.0.   She saw Dr. Meda Coffee 10/05/15 with  worsening dyspnea on exertion that was felt multifactorial secondary to COPD and hypertrophic cardiomyopathy. She recommended continuing beta blockers. She felt if her symptoms continued to deteriorate she might consider left heart catheterization.  Seen back 2 weeks ago - more short of breath and more swelling. Using oxygen constantly. BP elevated. She was started on Losartan and had her Lasix increased.   Comes in today. Here with her husband. She is in a wheelchair. She remains short of breath - this is chronic. Very limited with her activities. Really does not go out of the house very much. She feels ok for the most part and seems to be at her baseline. No chest pain. On her oxygen all the time. Husband is asking about if an EP consult would be helpful but she does not have any EP issues. Not swelling. Weight seems stable. Don't really see where extra diuretics have helped. She is for labs today. Her BP has improved with the low dose ARB therapy.   04/27/2016 - 2 months follow up, she feels better, continues to use 3 L of home O2, not at night anymore. She is able to walk and is not using a wheelchair anymore. Lower extremity edema has improved, no orthopna or PND. No chest pain.  07/15/2016 - the patient is coming after 3 months, accompanied by her husband, she states that she has been feeling worse for about a week with worsening shortness of breath at rest. She has also noticed worsening lower extremity edema. She denies any palpitations or syncope. No chest pain. She is on oxygen 24/7 with progressively worsening shortness of breath.  Past  Medical History:  Diagnosis Date  . Actinomycosis, cervicofacial   . CAD (coronary artery disease)    a. coronary calcification on prior Chest CT;  b. Dob MV 1/14:  EF 85%, no ischemia  . COPD (chronic obstructive pulmonary disease) ([place])   . Emphysema   . HTN (hypertension)   . Hx of echocardiogram    Echocardiogram 04/16/12: Mild focal basal septal  hypertrophy, EF 70-75% with dynamic obstruction with mid cavity obliteration, grade 1 diastolic dysfunction, MAC, atrial septal thickening with findings consistent with lipomatous hypertrophy  . Hyperlipidemia   . PVD (peripheral vascular disease) ([place])     Past Surgical History:  Procedure Laterality Date  . APPENDECTOMY    . BONE GRAFT HIP ILIAC CREST    . TONSILLECTOMY       Medications: Current Outpatient Prescriptions  Medication Sig Dispense Refill  . albuterol (PROVENTIL HFA;VENTOLIN HFA) 108 (90 BASE) MCG/ACT inhaler Inhale 2 puffs into the lungs every 6 (six) hours as needed. For shortness of breath    . aspirin 81 MG tablet Take 81 mg by mouth daily.      Marland Kitchen aspirin-acetaminophen-caffeine (EXCEDRIN MIGRAINE) 250-250-65 MG tablet Take 1 tablet by mouth every 6 (six) hours as needed for headache.    Marland Kitchen atorvastatin (LIPITOR) 20 MG tablet Take 1 tablet (20 mg total) by mouth daily. 90 tablet 3  . budesonide-formoterol (SYMBICORT) 160-4.5 MCG/ACT inhaler INHALE 2 PUFFS INTO THE LUNGS 2 (TWO) TIMES DAILY. 3 Inhaler 3  . Calcium Carb-Cholecalciferol (CALCIUM 600 + D PO) Take 1 tablet by mouth daily.    . clonazePAM (KLONOPIN) 0.5 MG tablet Take 0.5 tablets (0.25 mg total) by mouth 2 (two) times daily. 60 tablet 0  . furosemide (LASIX) 40 MG tablet Take 1 tablet (40 mg total) by mouth 2 (two) times daily. 180 tablet 3  . losartan (COZAAR) 25 MG tablet Take 1 tablet (25 mg total) by mouth daily. 90 tablet 3  . Magnesium 500 MG CAPS Take 1 capsule by mouth daily.    . Multiple Vitamin (MULTIVITAMIN) tablet Take 1 tablet by mouth daily.      . nebivolol (BYSTOLIC) 5 MG tablet Take 1 tablet (5 mg total) by mouth daily. 90 tablet 3  . potassium chloride (K-DUR) 10 MEQ tablet Take 10 mEq by mouth 2 (two) times daily.    . Tiotropium Bromide Monohydrate (SPIRIVA RESPIMAT) 2.5 MCG/ACT AERS INHALE 2 PUFFS INTO THE LUNGS DAILY. 3 Inhaler 3  . UNABLE TO FIND Inhale 1.5-3 L into the lungs  continuous.      No current facility-administered medications for this visit.     Allergies: Allergies  Allergen Reactions  . Penicillins Hives    Has taken it since.  . Pneumococcal Vaccine Swelling    Severe localized swelling in arm  . Pneumovax [Pneumococcal Polysaccharide Vaccine] Hives    Social History: The patient  reports that she quit smoking about 11 years ago. Her smoking use included Cigarettes. She has a 49.00 pack-year smoking history. She has never used smokeless tobacco. She reports that she does not drink alcohol or use drugs.   Family History: The patient's family history includes Coronary artery disease in her brother; Emphysema in her father, sister, sister, and sister; Heart disease in her father and mother; Stomach cancer in her father and sister.   Review of Systems: Please see the history of present illness.   Otherwise, the review of systems is positive for none.   All other systems are reviewed  and negative.   Physical Exam: VS:  BP 126/70   Pulse 87   Ht _0  (1.549 m)   Wt 122 lb (55.3 kg)   BMI 23.05 kg/m  .  BMI Body mass index is 23.05 kg/m.  Wt Readings from Last 3 Encounters:  07/15/16 122 lb (55.3 kg)  04/27/16 121 lb 6.4 oz (55.1 kg)  04/06/16 120 lb (54.4 kg)    General: Pleasant.  Chronically ill appearing. She is alert and in no acute distress. She has oxygen in place. In a wheelchair.  HEENT: Normal.  Neck: Supple, no JVD, carotid bruits, or masses noted.  Cardiac: Heart tones are distant. Mild bilateral lower extremity edema up to her mid calves. Respiratory:  Lungs with decreased breath sounds and mildly increased work of breathing.  GI: Soft and nontender.  MS: No deformity or atrophy. Gait not tested Skin: Warm and dry. Color is normal.  Neuro:  Strength and sensation are intact and no gross focal deficits noted.  Psych: Alert, appropriate and with normal affect.  LABORATORY DATA:  EKG:  EKG is not ordered today.   Lab  Results  Component Value Date   WBC 8.0 12/07/2015   HGB 13.8 12/07/2015   HCT 42.9 12/07/2015   PLT 319 12/07/2015   GLUCOSE 100 (H) 02/10/2016   CHOL 213 12/16/2008   TRIG 166 12/16/2008   HDL 62 12/16/2008   LDLCALC 116 12/16/2008   ALT 17 02/10/2016   AST 14 02/10/2016   NA 140 02/10/2016   K 4.6 02/10/2016   CL 99 02/10/2016   CREATININE 0.83 02/10/2016   BUN 25 02/10/2016   CO2 31 02/10/2016   TSH 0.603 09/13/2015   INR 0.91 09/09/2015    BNP (last 3 results)  Recent Labs  09/09/15 0255 10/05/15 1159 02/10/16 1615  BNP 58.0 20.1 15.7    ProBNP (last 3 results) No results for input(s): PROBNP in the last 8760 hours.  Other Studies Reviewed Today:  TTE: 09/15/2015 Left ventricle: The cavity size was normal. There was mild focal  basal hypertrophy of the septum. Systolic function was vigorous.  The estimated ejection fraction was in the range of 65% to 70%.  Wall motion was normal; there were no regional wall motion  abnormalities. Doppler parameters are consistent with abnormal  left ventricular relaxation (grade 1 diastolic dysfunction).  Myoview Impression from 02/2014 Exercise Capacity:  Lexiscan with no exercise. BP Response:  Hypertensive blood pressure response. Clinical Symptoms:  There is dyspnea. ECG Impression:  No significant ST segment change suggestive of ischemia. Comparison with Prior Nuclear Study: Compared to 08/06/12, no change.  Overall Impression:  Normal stress nuclear study.  LV Ejection Fraction: 71%.  LV Wall Motion:  NL LV Function; NL Wall Motion  Kirk Ruths   Assessment/Plan:  1. Acute on chronic diastolic HF -  The patient has mild lower extremity edema however her lungs sound clear and her weight is unchanged. A we'll increase Lasix from 40 in the morning and 20 afternoon to 40 in the morning and 40 in the afternoon and follow in 2 weeks.  Her lower extremity edema might be also secondary to sedentary lifestyle  she sits abo BNP at the last visit 15, she is euvolemic.   2. Chronic dyspnea - This is multifactorial and more related to her lung disease.   3. Hypertrophic CM - tolerating nebivolol, she doesn't have significant murmur on physical exam.  4. COPD - I suspect this is her primary  problem - she is on chronic oxygen therapy however her dyspnea is worsening.  5. HTN - controlled on current regimen.   The following changes have been made:  See above.  Labs/ tests ordered today include:    Orders Placed This Encounter  Procedures  . Basic Metabolic Panel (BMET)  . Pro b natriuretic peptide  . EKG 12-Lead    Signed: Ena Dawley, MD 07/15/2016 4:12 PM  [facility] [address] [place], [place]  [address] Phone: [phone] Fax: [phone]

## 2016-07-16 LAB — SPECIMEN STATUS

## 2016-07-18 ENCOUNTER — Telehealth: Payer: Self-pay | Admitting: *Deleted

## 2016-07-18 DIAGNOSIS — I5033 Acute on chronic diastolic (congestive) heart failure: Secondary | ICD-10-CM

## 2016-07-18 DIAGNOSIS — I110 Hypertensive heart disease with heart failure: Secondary | ICD-10-CM

## 2016-07-18 LAB — BASIC METABOLIC PANEL
BUN/Creatinine Ratio: 25 (ref 12–28)
BUN: 22 mg/dL (ref 8–27)
CO2: 31 mmol/L — ABNORMAL HIGH (ref 18–29)
Calcium: 9.8 mg/dL (ref 8.7–10.3)
Chloride: 98 mmol/L (ref 96–106)
Creatinine, Ser: 0.87 mg/dL (ref 0.57–1.00)
GFR calc Af Amer: 73 mL/min/{1.73_m2} (ref >59)
GFR calc non Af Amer: 64 mL/min/{1.73_m2} (ref >59)
Glucose: 86 mg/dL (ref 65–99)
Potassium: 5.3 mmol/L — ABNORMAL HIGH (ref 3.5–5.2)
Sodium: 140 mmol/L (ref 134–144)

## 2016-07-18 LAB — PRO B NATRIURETIC PEPTIDE: NT-Pro BNP: 90 pg/mL (ref 0–738)

## 2016-07-18 MED ORDER — FUROSEMIDE 20 MG PO TABS: 20.0000 mg | ORAL_TABLET | Freq: Two times a day (BID) | ORAL | 1 refills | Status: DC

## 2016-07-18 NOTE — Telephone Encounter (Signed)
Notified the pt that per Dr Delton SeeNelson, her blood work is not suggestive of heart failure but rather pulmonary etiology of her shortness of breath. Informed the pt that per Dr Delton SeeNelson, she recommends that we cut down her Lasix to 20 mg in the mornings and 20 in the afternoon and discontinue potassium pills as she is hyperkalemic. Confirmed the pharmacy of choice with the pt.  Pt verbalized understanding and agrees with this plan.

## 2016-07-18 NOTE — Telephone Encounter (Signed)
-----   Message from Lars MassonKatarina H Nelson, MD sent at 07/18/2016  4:42 PM EST ----- Her blood work is not suggestive of heart failure but rather pulmonary etiology of her shortness of breath. I would cut down her Lasix to 20 mg in the mornings and 20 in the afternoon and discontinue potassium pills as she is hyperkalemic.

## 2016-07-21 LAB — CBC WITH DIFFERENTIAL/PLATELET
Basophils Absolute: 0 10*3/uL (ref 0.0–0.2)
Basos: 0 %
EOS (ABSOLUTE): 0.1 10*3/uL (ref 0.0–0.4)
Eos: 2 %
Hematocrit: 36.7 % (ref 34.0–46.6)
Hemoglobin: 11.9 g/dL (ref 11.1–15.9)
Immature Grans (Abs): 0 10*3/uL (ref 0.0–0.1)
Immature Granulocytes: 0 %
Lymphocytes Absolute: 1.7 10*3/uL (ref 0.7–3.1)
Lymphs: 25 %
MCH: 30.6 pg (ref 26.6–33.0)
MCHC: 32.4 g/dL (ref 31.5–35.7)
MCV: 94 fL (ref 79–97)
Monocytes Absolute: 0.7 10*3/uL (ref 0.1–0.9)
Monocytes: 10 %
Neutrophils Absolute: 4.4 10*3/uL (ref 1.4–7.0)
Neutrophils: 63 %
Platelets: 325 10*3/uL (ref 150–379)
RBC: 3.89 x10E6/uL (ref 3.77–5.28)
RDW: 13.3 % (ref 12.3–15.4)
WBC: 7 10*3/uL (ref 3.4–10.8)

## 2016-07-21 LAB — SPECIMEN STATUS REPORT

## 2016-07-29 ENCOUNTER — Other Ambulatory Visit: Payer: Self-pay

## 2016-07-29 ENCOUNTER — Ambulatory Visit (INDEPENDENT_AMBULATORY_CARE_PROVIDER_SITE_OTHER): Payer: PPO | Admitting: Physician Assistant

## 2016-07-29 ENCOUNTER — Encounter: Payer: Self-pay | Admitting: Physician Assistant

## 2016-07-29 VITALS — BP 124/64 | HR 84 | Ht 61.0 in | Wt 125.0 lb

## 2016-07-29 DIAGNOSIS — R0602 Shortness of breath: Secondary | ICD-10-CM | POA: Diagnosis not present

## 2016-07-29 DIAGNOSIS — I2510 Atherosclerotic heart disease of native coronary artery without angina pectoris: Secondary | ICD-10-CM

## 2016-07-29 DIAGNOSIS — E875 Hyperkalemia: Secondary | ICD-10-CM

## 2016-07-29 DIAGNOSIS — I10 Essential (primary) hypertension: Secondary | ICD-10-CM

## 2016-07-29 DIAGNOSIS — I422 Other hypertrophic cardiomyopathy: Secondary | ICD-10-CM

## 2016-07-29 DIAGNOSIS — I5032 Chronic diastolic (congestive) heart failure: Secondary | ICD-10-CM | POA: Diagnosis not present

## 2016-07-29 MED ORDER — ISOSORBIDE MONONITRATE ER 30 MG PO TB24: 15.0000 mg | ORAL_TABLET | Freq: Every day | ORAL | 5 refills | Status: DC

## 2016-07-29 NOTE — Patient Instructions (Addendum)
Medication Instructions:   START TAKING  IMDUR 15 MG DAILY  ( 1/2 TABLET OF 30 MG TABLET )  If you need a refill on your cardiac medications before your next appointment, please call your pharmacy.  Labwork: BMET TODAY    Testing/Procedures: NONE ORDERED  TODAY   Follow-Up: Your physician wants you to follow-up in:  IN  6  MONTHS WITH DR Johnell ComingsNELSON   You will receive a reminder letter in the mail two months in advance. If you don't receive a letter, please call our office to schedule the follow-up appointment.      Any Other Special Instructions Will Be Listed Below (If Applicable).

## 2016-07-29 NOTE — Patient Outreach (Signed)
[facility]) Care Management  07/29/2016  [name] [birth year] [id number]   Telephone call to patient for monthly call. Spoke with designated party release (spouse) as patient unavailable. He states that patient is getting ready for a doctor's appointment for follow up with cardiologist.  He states that patient was given increase in lasix but taken off as patient was not having problems with her heart failure. He states that the appointment today is a follow up with the cardiologist.  He asked why they were told patient does not have heart failure as he thought that was a diagnosis she would always have.  Explained to him that patient would always have heart failure diagnosis but that at this time after examination and blood work there was no indications of acute heart failure and that the increase in lasix  would cause more problems if patient left on increased dose and her not needing it. He verbalized understanding.  Discussed with him also that patient has COPD and that is it a progressive disease and that she will probably need more treatment for her COPD. Suggestion made for patient to see her pulmonologist for evaluation.  He verbalized understanding and states that she does have an appointment this month.    Plan: RN Health Coach will call for follow up again in the month of January.    Bary Lericheionne J Delio Slates, RN, MSN [facility] Care [facility] RN Telephonic Health Coach [phone]

## 2016-07-29 NOTE — Progress Notes (Signed)
Cardiology Office Note    Date:  07/29/2016  ID:  [name], DOB [birth year], MRN [id number] PCP:  Velna Hatchet, MD  Cardiologist:  Dr. Meda Coffee   Chief Complaint: f/u shortness of breath  History of Present Illness:  [name] is a 82 y.o. female with history of HTN, COPD, ?hypertrophic cardiomyopathy 2013, mild carotid artery disease, coronary calcification on CT 2013 (neg dobutamine nuc 2014, neg Lexiscan nuc 02/2014), chronic diastolic CHF, hyperlipidemia who presents for f/u of shortness of breath. She saw Dr. Verl Blalock 8/12 after undergoing a "Life Screening Survey" which reportedly demonstrated mild carotid artery stenosis bilaterally (no percentage given) and mildly reduced ABIs bilaterally (0.9). She has since gone on to have normal ABIs in 2015. She's had a question of infrarenal abdominal aorta dilatation on CT in the past but subsequent ultrasounds have been negative for aneurysm. She's not had any recent carotid duplex. Her last echo 09/2015 showed normal EF, mild focal basal hypertrophy of the septum, EF 65-70%, grade 1 DD (however, note: prior echo 2013 showed EF 70-75% with dynamic obstruction and mid-cavity obliteration). She is also followed by pulmonology. In 2017 she was hospitalized for a/c COPD exacerbation with PNA, hypoxia and hypercarbia. She was also treated for potential HF as well. She saw Dr. Meda Coffee 10/05/15 with worsening dyspnea on exertion that was felt multifactorial secondary to COPD and hypertrophic cardiomyopathy. She recommended continuing beta blockers. She felt if her symptoms continued to deteriorate she might consider left heart catheterization.  Last CXR 11/2015 demonstrated irregular linear opacity and two small pulmonary nodules not significantly changed from 11/2014. More recently the patient was seen by Dr. Meda Coffee on 07/15/16 with worsening lower extremity edema and shortness of braeth. Labs 07/15/16: normal CBC (Hgb11.9), pBNP 90, BUN 22, Cr 0.87, K 5.3, CO2  31. Dr. Meda Coffee recommended to cut down Lasix to 44m BID and d/c potassium supplementation as she felt sx were more c/w pulm process.  The patient returns today for follow-up. She reports initial improvement on higher dose of Lasix in the past but feels her LEE is returning. Upon exam I cannot appreciate any significant edema. She reports her dyspnea on exertion has actually been chronic for the last 6 months or more. She doesn't feel like it's any worse today than it has been in quite a while. She's not had any chest pain, dizziness, or syncope. She uses home O2 24/7. She has f/u with pulmonology on 08/08/16.  Past Medical History:  Diagnosis Date  . Actinomycosis, cervicofacial   . CAD (coronary artery disease)    a. coronary calcification on prior Chest CT;  b. Dob MV 1/14:  EF 85%, no ischemia  . COPD (chronic obstructive pulmonary disease) ([place]   . Emphysema   . HTN (hypertension)   . Hx of echocardiogram    Echocardiogram 04/16/12: Mild focal basal septal hypertrophy, EF 70-75% with dynamic obstruction with mid cavity obliteration, grade 1 diastolic dysfunction, MAC, atrial septal thickening with findings consistent with lipomatous hypertrophy  . Hyperlipidemia   . PVD (peripheral vascular disease) ([facility]     Past Surgical History:  Procedure Laterality Date  . APPENDECTOMY    . BONE GRAFT HIP ILIAC CREST    . TONSILLECTOMY      Current Medications: Current Outpatient Prescriptions  Medication Sig Dispense Refill  . albuterol (PROVENTIL HFA;VENTOLIN HFA) 108 (90 BASE) MCG/ACT inhaler Inhale 2 puffs into the lungs every 6 (six) hours as needed. For shortness of breath    .  aspirin 81 MG tablet Take 81 mg by mouth daily.      Marland Kitchen aspirin-acetaminophen-caffeine (EXCEDRIN MIGRAINE) 250-250-65 MG tablet Take 1 tablet by mouth every 6 (six) hours as needed for headache.    . budesonide-formoterol (SYMBICORT) 160-4.5 MCG/ACT inhaler INHALE 2 PUFFS INTO THE LUNGS 2 (TWO) TIMES DAILY. 3  Inhaler 3  . Calcium Carb-Cholecalciferol (CALCIUM 600 + D PO) Take 1 tablet by mouth daily.    . clonazePAM (KLONOPIN) 0.5 MG tablet Take 0.5 tablets (0.25 mg total) by mouth 2 (two) times daily. 60 tablet 0  . furosemide (LASIX) 20 MG tablet Take 1 tablet (20 mg total) by mouth 2 (two) times daily. Take 1 tab at 8 am and the 2nd tab at 2 pm 180 tablet 1  . losartan (COZAAR) 25 MG tablet Take 1 tablet (25 mg total) by mouth daily. 90 tablet 3  . Magnesium 500 MG CAPS Take 1 capsule by mouth daily.    . Multiple Vitamin (MULTIVITAMIN) tablet Take 1 tablet by mouth daily.      . nebivolol (BYSTOLIC) 5 MG tablet Take 1 tablet (5 mg total) by mouth daily. 90 tablet 3  . Tiotropium Bromide Monohydrate (SPIRIVA RESPIMAT) 2.5 MCG/ACT AERS INHALE 2 PUFFS INTO THE LUNGS DAILY. 3 Inhaler 3  . UNABLE TO FIND Inhale 1.5-3 L into the lungs continuous.     Marland Kitchen atorvastatin (LIPITOR) 20 MG tablet Take 1 tablet (20 mg total) by mouth daily. 90 tablet 3   No current facility-administered medications for this visit.      Allergies:   Penicillins; Pneumococcal vaccine; and Pneumovax [pneumococcal polysaccharide vaccine]   Social History   Social History  . Marital status: Married    Spouse name: N/A  . Number of children: 4  . Years of education: N/A   Occupational History  . retired Retired    Air cabin crew   Social History Main Topics  . Smoking status: Former Smoker    Packs/day: 1.00    Years: 49.00    Types: Cigarettes    Quit date: 07/11/2005  . Smokeless tobacco: Never Used     Comment: smoked for 48 years 1 ppd  . Alcohol use No  . Drug use: No  . Sexual activity: Not Asked   Other Topics Concern  . None   Social History Narrative  . None     Family History:  The patient's family history includes Coronary artery disease in her brother; Emphysema in her father, sister, sister, and sister; Heart disease in her father and mother; Stomach cancer in her father and sister.   ROS:     Please see the history of present illness. No recent travel, surgery, bedrest. No LE erythema or pain/tenderness. All other systems are reviewed and otherwise negative.    PHYSICAL EXAM:   VS:  BP 124/64   Pulse 84   Ht _0  (1.549 m)   Wt 125 lb (56.7 kg)   BMI 23.62 kg/m   BMI: Body mass index is 23.62 kg/m. GEN: Thin well developed WF in no acute distress  HEENT: normocephalic, atraumatic Neck: no JVD, carotid bruits, or masses Cardiac: RRR; no murmurs, rubs, or gallops, no edema  Respiratory:  Diminished throughout bilaterally without wheezing/rales/rhonchi, normal work of breathing GI: soft, nontender, nondistended, + BS MS: no deformity or atrophy  Skin: warm and dry, no rash Neuro:  Alert and Oriented x 3, Strength and sensation are intact, follows commands Psych: euthymic mood, full affect  Wt  Readings from Last 3 Encounters:  07/29/16 125 lb (56.7 kg)  07/15/16 122 lb (55.3 kg)  04/27/16 121 lb 6.4 oz (55.1 kg)      Studies/Labs Reviewed:   EKG:  EKG was ordered today and personally reviewed by me and demonstrates NSR,  85bpm, TWI avL. Rounded ST upsloped pattern inferiorly, unchanged from prior.   Recent Labs: 09/13/2015: TSH 0.603 09/15/2015: Magnesium 1.9 12/07/2015: Hemoglobin 13.8 02/10/2016: ALT 17; Brain Natriuretic Peptide 15.7 07/15/2016: BUN 22; Creatinine, Ser 0.87; NT-Pro BNP 90; Platelets WILL FOLLOW; Platelets 325; Potassium 5.3; Sodium 140   Lipid Panel    Component Value Date/Time   CHOL 213 12/16/2008   TRIG 166 12/16/2008   HDL 62 12/16/2008   LDLCALC 116 12/16/2008    Additional studies/ records that were reviewed today include: Summarized above.    ASSESSMENT & PLAN:   1. Shortness of breath - suspect predominantly due to COPD. Echo in 09/2015 did not show any significant hypertrophic cardiomyopathy or obstruction, despite prior diagnosis of this. She does not appear significantly volume overloaded today. pBNP recently was normal. Also in  the differential could be progressive CAD. Discussed potential options with patient. Will try a trial of Imdur 77m daily to see if this helps. 2. Coronary calcification on CT - prior negative stress testing as above. Will try a trial of Imdur to see if it helps with breathing. If zero improvement I suspect dyspnea primarily due to COPD. 3. Chronic diastolic CHF - appears euvolemic on exam. Weight is up several lbs but with clunky shoes today. Recheck BMET today. If labs stable, anticipate continuing Lasix 270mBID with optional additional tablet three times a week if needed. 4. Hypertrophic cardiomyopathy - most recent echocardiogram did not suggest this finding. No significant murmur on exam. I asked patient to follow her symptoms cautiously on long-acting nitrate and call our office with update several days after starting. If she develops any dizziness this will need to be stopped. 5. Hyperkalemia - reassess today with BMET, Potassium supplements have been discontinued. 6. HTN - controlled, follow.  Disposition: F/u with Dr. NeMeda Coffeen 6 months. She also has f/u with pulmonology end of the month, needs attention to abnormal CXR findings from earlier this year.   Medication Adjustments/Labs and Tests Ordered: Current medicines are reviewed at length with the patient today.  Concerns regarding medicines are outlined above. Medication changes, Labs and Tests ordered today are summarized above and listed in the Patient Instructions accessible in Encounters.   SiRaechel AcheA-C  07/29/2016 3:03 PM    [facility] [address]  [id number]: [phone]: [phone]

## 2016-07-30 LAB — BASIC METABOLIC PANEL
BUN / CREAT RATIO: 34 — AB (ref 12–28)
BUN: 25 mg/dL (ref 8–27)
CHLORIDE: 95 mmol/L — AB (ref 96–106)
CO2: 32 mmol/L — AB (ref 18–29)
CREATININE: 0.74 mg/dL (ref 0.57–1.00)
Calcium: 9.8 mg/dL (ref 8.7–10.3)
GFR calc Af Amer: 89 mL/min/{1.73_m2} (ref >59)
GFR calc non Af Amer: 77 mL/min/{1.73_m2} (ref >59)
GLUCOSE: 98 mg/dL (ref 65–99)
Potassium: 4.4 mmol/L (ref 3.5–5.2)
SODIUM: 141 mmol/L (ref 134–144)

## 2016-08-04 ENCOUNTER — Other Ambulatory Visit: Payer: Self-pay

## 2016-08-04 NOTE — Patient Outreach (Signed)
[facility]) Care Management  08/04/2016  [name] Feb 14, 1935 [id number]   Telephone call to patient for monthly call. Husband states patient is not available at this time and asked for a call another time.  Plan: RN Health Coach will attempt patient again in the month of January.  Sheri Lericheionne J Kayliegh Boyers, RN, MSN [facility] Care Management RN Telephonic Health Coach [phone]

## 2016-08-05 ENCOUNTER — Other Ambulatory Visit: Payer: Self-pay

## 2016-08-05 NOTE — Patient Outreach (Signed)
[facility]) Care Management  08/05/2016  Sheri SleeperHazel H Mcdonald 08/07/1934 [id number]   Telephone call to patient for monthly outreach.  No answer.  HIPAA compliant voice message left.  Plan: RN Health Coach will attempt patient in the month of February.    Sheri Lericheionne J Lilyanah Celestin, RN, MSN [facility] Care Management RN Telephonic Health Coach [phone]

## 2016-08-08 ENCOUNTER — Encounter: Payer: Self-pay | Admitting: Emergency Medicine

## 2016-08-08 ENCOUNTER — Ambulatory Visit (INDEPENDENT_AMBULATORY_CARE_PROVIDER_SITE_OTHER): Payer: PPO | Admitting: Emergency Medicine

## 2016-08-08 DIAGNOSIS — J449 Chronic obstructive pulmonary disease, unspecified: Secondary | ICD-10-CM | POA: Diagnosis not present

## 2016-08-08 MED ORDER — BUDESONIDE-FORMOTEROL FUMARATE 160-4.5 MCG/ACT IN AERO: 2.0000 | INHALATION_SPRAY | Freq: Two times a day (BID) | RESPIRATORY_TRACT | 0 refills | Status: DC

## 2016-08-08 NOTE — Addendum Note (Signed)
Addended by: Pamalee LeydenWIGGINS, Jamarco Zaldivar J on: 08/08/2016 03:45 PM   Modules accepted: Orders

## 2016-08-08 NOTE — Progress Notes (Signed)
Subjective:     Patient ID: [name], female   DOB: 1934/09/22, 82 y.o.   MRN: [id number]  HPI 82 yo woman, former smoker (50 pk-yrs), hx COPD, HTN and diastolic CHF, PVD. She was admitted for AE-COPD 10/7-10/11. CT scan during that admission showed scattered LL nodular disease.    ROV 01/05/16 -- [name] has a history of severe COPD and chronic hypoxemic respiratory failure. She also has a history of pulmonary nodules that we've followed with serial CT scans and deemed benign based on their stability. She is currently managed on Spiriva respimat, Symbicort. She was admitted in 3/17 for AE-COPD and resp failure. She believes that her breathing has improved. She has been dealing with labile BP, HTN. She is using O2 at 3L/min w exertion.      ROV 04/06/16 -- visit for history of severe COPD, chronic hypoxemic respiratory failure, pulmonary nodules were benign on serial scans. She uses Spiriva and Symbicort. She is on o2 at 3L/min. No flares since last time. She had a bad reaction to pneumovax in the past.   ROV 08/08/16 -- Follow-up visit for severe COPD with associated chronic hypoxemic respiratory failure. She is also been followed in the past with serial CT scans of the chest for nodular disease that has been stable and deemed benign. She's currently using  . She is quite limited, can walk 40 feet, has to stop with household chores. Oxygen at 3L/min at all times. She uses albuterol rarely. No cough or wheeze. No flares since last time. Flu shot up to date. She is looking into Stem Cell therapy for her COPD   No flowsheet data found.     Objective:   Physical Exam Vitals:   08/08/16 1449  BP: 118/70  BP Location: Right Arm  Patient Position: Sitting  Cuff Size: Normal  Pulse: 81  SpO2: 97%  Weight: 125 lb 3.2 oz (56.8 kg)  Height: 5\' 1"  (1.549 m)   Gen: Pleasant, elderly woman, in no distress,  normal affect  ENT: No lesions,  mouth clear,  oropharynx clear, no postnasal  drip  Neck: No JVD, no TMG, no carotid bruits  Lungs: No use of accessory muscles, distant, diminished BS in bases, no wheeze  Cardiovascular: RRR, heart sounds normal, no murmur or gallops, no peripheral edema  Musculoskeletal: No deformities, no cyanosis or clubbing  Neuro: alert, non focal  Skin: Warm, no lesions or rashes    CT scan chest , personally reviewed by me 11/26/14 --  COMPARISON: CT 05/08/2014 04/18/2012  FINDINGS: Mediastinum/Nodes: No axillary supraclavicular lymphadenopathy. No mediastinal hilar lymphadenopathy no pericardial fluid. Esophagus is normal.  Lungs/Pleura: The new sub solid nodule described on comparison CT is no longer measurable.  Additional solid nodules are not changed. For example 7 mm nodule in the right upper lobe on image 27, series 3 is unchanged. Adjacent 6 mm nodule is also unchanged (image 26).  More superiorly in the right upper lobe band of linear nodular thickening measures 5 mm compared to 7 mm (image 11, series 3. This right upper lobe linear nodularity first appear CT of 05/02/2013 and has lengthed and thinned.  There is additional scattered sub 5 mm nodules, stable. 3 mm left upper lobe nodule image 17, unchanged  Upper abdomen: Limited view of the liver, kidneys, pancreas are unremarkable. Normal adrenal glands.  Musculoskeletal: No aggressive osseous lesion.  IMPRESSION: 1. Resolution of right upper lobe sub solid nodule. 2. Stable bilateral noncalcified nodules for greater than  2 years. 3. Band of linear nodular thickening at the right lung apex first presented on CT of 05/02/2013. This nodular thickening appears to continue to decrease in volume. Consider one additional follow-up for this right upper lobe bandlike nodular thickening in 6-12 months       Assessment:     COPD (chronic obstructive pulmonary disease) (HCC) Please continue your current medications as you are taking them  Continue your  oxygen at 3L/min.  We discussed pulmonary rehab today - we will defer this for now.  We also talked about stem cell therapy from [facility]. As mentioned, I do not have any data to suggest that there is benefit or harm associated with the procedure.  Flu shot up to date  Follow with Dr Delton CoombesByrum in 4 months or sooner if you have any problems.   Levy Pupaobert Ramadan Couey, MD, PhD 08/08/2016, 3:35 PM [facility] [phone] or if no answer [phone]

## 2016-08-08 NOTE — Patient Instructions (Signed)
Please continue your current medications as you are taking them  Continue your oxygen at 3L/min.  We discussed pulmonary rehab today - we will defer this for now.  We also talked about stem cell therapy from [facility]. As mentioned, I do not have any data to suggest that there is benefit or harm associated with the procedure.  Flu shot up to date  Follow with Dr Jachai Okazaki in 4 months or sooner if you have any problems. 

## 2016-08-08 NOTE — Assessment & Plan Note (Signed)
Please continue your current medications as you are taking them  Continue your oxygen at 3L/min.  We discussed pulmonary rehab today - we will defer this for now.  We also talked about stem cell therapy from [facility]. As mentioned, I do not have any data to suggest that there is benefit or harm associated with the procedure.  Flu shot up to date  Follow with Dr Delton CoombesByrum in 4 months or sooner if you have any problems.

## 2016-08-22 ENCOUNTER — Ambulatory Visit: Payer: Self-pay

## 2016-08-22 ENCOUNTER — Other Ambulatory Visit: Payer: Self-pay

## 2016-08-22 NOTE — Patient Outreach (Signed)
[facility]) Care Management  [facility]  08/22/2016   Terald SleeperHazel H [place] 16-Jan-1935 [id number]  Subjective: Incoming return call from patient.  She reports with the shift of the weather she has had some increased shortness of breath.  She reports that she saw the pulmonologist recently and no changes ordered to current regimen.  Patient report she manages her symptoms and stays in most of the time. Discussed with patient COPD and when to notify physician.  She verbalized understanding.    Objective:   Encounter Medications:  Outpatient Encounter Prescriptions as of 08/22/2016  Medication Sig Note  . albuterol (PROVENTIL HFA;VENTOLIN HFA) 108 (90 BASE) MCG/ACT inhaler Inhale 2 puffs into the lungs every 6 (six) hours as needed. For shortness of breath   . aspirin 81 MG tablet Take 81 mg by mouth daily.     Marland Kitchen. aspirin-acetaminophen-caffeine (EXCEDRIN MIGRAINE) 250-250-65 MG tablet Take 1 tablet by mouth every 6 (six) hours as needed for headache.   . budesonide-formoterol (SYMBICORT) 160-4.5 MCG/ACT inhaler INHALE 2 PUFFS INTO THE LUNGS 2 (TWO) TIMES DAILY.   . Calcium Carb-Cholecalciferol (CALCIUM 600 + D PO) Take 1 tablet by mouth daily.   . clonazePAM (KLONOPIN) 0.5 MG tablet Take 0.5 tablets (0.25 mg total) by mouth 2 (two) times daily.   . furosemide (LASIX) 20 MG tablet Take 1 tablet (20 mg total) by mouth 2 (two) times daily. Take 1 tab at 8 am and the 2nd tab at 2 pm   . isosorbide mononitrate (IMDUR) 30 MG 24 hr tablet Take 0.5 tablets (15 mg total) by mouth daily.   Marland Kitchen. losartan (COZAAR) 25 MG tablet Take 1 tablet (25 mg total) by mouth daily.   . Magnesium 500 MG CAPS Take 1 capsule by mouth daily.   . Multiple Vitamin (MULTIVITAMIN) tablet Take 1 tablet by mouth daily.     . nebivolol (BYSTOLIC) 5 MG tablet Take 1 tablet (5 mg total) by mouth daily.   . Tiotropium Bromide Monohydrate (SPIRIVA RESPIMAT) 2.5 MCG/ACT AERS INHALE 2 PUFFS INTO THE LUNGS DAILY.   Marland Kitchen.  UNABLE TO FIND Inhale 1.5-3 L into the lungs continuous.  05/12/2016: Patient using oxygen as needed  . atorvastatin (LIPITOR) 20 MG tablet Take 1 tablet (20 mg total) by mouth daily.   . budesonide-formoterol (SYMBICORT) 160-4.5 MCG/ACT inhaler Inhale 2 puffs into the lungs 2 (two) times daily. (Patient not taking: Reported on 08/22/2016)    No facility-administered encounter medications on file as of 08/22/2016.     Functional Status:  In your present state of health, do you have any difficulty performing the following activities: 11/11/2015 10/30/2015  Hearing? Malvin JohnsY Y  Vision? N N  Difficulty concentrating or making decisions? N N  Walking or climbing stairs? Y Y  Dressing or bathing? N N  Doing errands, shopping? Malvin JohnsY Y  Preparing Food and eating ? Y Y  Using the Toilet? N N  In the past six months, have you accidently leaked urine? N N  Do you have problems with loss of bowel control? N N  Managing your Medications? N N  Managing your Finances? N N  Housekeeping or managing your Housekeeping? Malvin JohnsY Y  Some recent data might be hidden    Fall/Depression Screening: PHQ 2/9 Scores 08/22/2016 07/05/2016 05/12/2016 04/12/2016 03/17/2016 02/01/2016 12/08/2015  PHQ - 2 Score 0 0 0 0 0 0 0  PHQ- 9 Score - - - - - - -    Assessment: Patient continues to benefit  from health coach outreach for disease management and support.     Plan:  [facility] CM Care Plan Problem One   Flowsheet Row Most Recent Value  Care Plan Problem One  COPD knowledge deficit  Role Documenting the Problem One  Health Coach  Care Plan for Problem One  Active  [facility] Long Term Goal (31-90 days)  Patient will be able to explain COPD zones on action plan within the next 90 days   [facility] Long Term Goal Start Date  08/22/16 Staci Righter continued]  Interventions for Problem One Long Term Goal  RN Health Coach reiterated with patient COPD action plan and purpose.       RN Health Coach will contact patient in the month of March and patient agrees to  next outreach.;  Bary Leriche, RN, MSN [facility] Care Management RN Telephonic Health Coach [phone]

## 2016-08-22 NOTE — Patient Outreach (Signed)
[facility]) Care Management  08/22/2016  Terald SleeperHazel H [place] [birth year] [id number]   Telephone call to patient for monthly call.  No answer.  HIPAA compliant voice message left.  Plan: RN Health Coach will send a letter to attempt outreach.  If no response will close case.    Bary Lericheionne J Laurelin Elson, RN, MSN [facility] Care Management RN Telephonic Health Coach [phone]

## 2016-09-19 ENCOUNTER — Emergency Department (HOSPITAL_COMMUNITY): Payer: PPO

## 2016-09-19 ENCOUNTER — Encounter (HOSPITAL_COMMUNITY): Payer: Self-pay | Admitting: Emergency Medicine

## 2016-09-19 ENCOUNTER — Emergency Department (HOSPITAL_COMMUNITY)
Admission: EM | Admit: 2016-09-19 | Discharge: 2016-09-19 | Disposition: A | Discharge status: Home / Self Care | Payer: PPO | Attending: Emergency Medicine | Admitting: Emergency Medicine

## 2016-09-19 ENCOUNTER — Ambulatory Visit: Payer: Self-pay

## 2016-09-19 DIAGNOSIS — I110 Hypertensive heart disease with heart failure: Secondary | ICD-10-CM | POA: Insufficient documentation

## 2016-09-19 DIAGNOSIS — I2510 Atherosclerotic heart disease of native coronary artery without angina pectoris: Secondary | ICD-10-CM | POA: Insufficient documentation

## 2016-09-19 DIAGNOSIS — J189 Pneumonia, unspecified organism: Secondary | ICD-10-CM

## 2016-09-19 DIAGNOSIS — Z87891 Personal history of nicotine dependence: Secondary | ICD-10-CM | POA: Insufficient documentation

## 2016-09-19 DIAGNOSIS — J181 Lobar pneumonia, unspecified organism: Secondary | ICD-10-CM | POA: Insufficient documentation

## 2016-09-19 DIAGNOSIS — J449 Chronic obstructive pulmonary disease, unspecified: Secondary | ICD-10-CM | POA: Insufficient documentation

## 2016-09-19 DIAGNOSIS — R0602 Shortness of breath: Secondary | ICD-10-CM | POA: Diagnosis not present

## 2016-09-19 DIAGNOSIS — I5033 Acute on chronic diastolic (congestive) heart failure: Secondary | ICD-10-CM | POA: Diagnosis not present

## 2016-09-19 DIAGNOSIS — R071 Chest pain on breathing: Secondary | ICD-10-CM | POA: Diagnosis not present

## 2016-09-19 DIAGNOSIS — R079 Chest pain, unspecified: Secondary | ICD-10-CM | POA: Diagnosis not present

## 2016-09-19 DIAGNOSIS — R0781 Pleurodynia: Secondary | ICD-10-CM | POA: Diagnosis not present

## 2016-09-19 LAB — CBC
HCT: 36.5 % (ref 36.0–46.0)
HEMOGLOBIN: 11.9 g/dL — AB (ref 12.0–15.0)
MCH: 30.5 pg (ref 26.0–34.0)
MCHC: 32.6 g/dL (ref 30.0–36.0)
MCV: 93.6 fL (ref 78.0–100.0)
Platelets: 309 10*3/uL (ref 150–400)
RBC: 3.9 MIL/uL (ref 3.87–5.11)
RDW: 12.6 % (ref 11.5–15.5)
WBC: 10.8 10*3/uL — ABNORMAL HIGH (ref 4.0–10.5)

## 2016-09-19 LAB — COMPREHENSIVE METABOLIC PANEL
ALBUMIN: 3.8 g/dL (ref 3.5–5.0)
ALK PHOS: 49 U/L (ref 38–126)
ALT: 16 U/L (ref 14–54)
ANION GAP: 11 (ref 5–15)
AST: 17 U/L (ref 15–41)
BILIRUBIN TOTAL: 0.7 mg/dL (ref 0.3–1.2)
BUN: 16 mg/dL (ref 6–20)
CALCIUM: 9.5 mg/dL (ref 8.9–10.3)
CO2: 29 mmol/L (ref 22–32)
Chloride: 96 mmol/L — ABNORMAL LOW (ref 101–111)
Creatinine, Ser: 0.89 mg/dL (ref 0.44–1.00)
GFR calc non Af Amer: >60 mL/min (ref >60)
GLUCOSE: 114 mg/dL — AB (ref 65–99)
POTASSIUM: 4.3 mmol/L (ref 3.5–5.1)
SODIUM: 136 mmol/L (ref 135–145)
TOTAL PROTEIN: 6.7 g/dL (ref 6.5–8.1)

## 2016-09-19 LAB — BRAIN NATRIURETIC PEPTIDE: B Natriuretic Peptide: 38.9 pg/mL (ref 0.0–100.0)

## 2016-09-19 LAB — I-STAT TROPONIN, ED: TROPONIN I, POC: 0 ng/mL (ref 0.00–0.08)

## 2016-09-19 MED ORDER — IOPAMIDOL (ISOVUE-370) INJECTION 76%
INTRAVENOUS | Status: AC
  Administered 2016-09-19: 100 mL
  Filled 2016-09-19: qty 100

## 2016-09-19 MED ORDER — LEVOFLOXACIN 750 MG PO TABS
750.0000 mg | ORAL_TABLET | Freq: Once | ORAL | Status: AC
  Administered 2016-09-19: 750 mg via ORAL
  Filled 2016-09-19: qty 1

## 2016-09-19 MED ORDER — ALBUTEROL SULFATE HFA 108 (90 BASE) MCG/ACT IN AERS
2.0000 | INHALATION_SPRAY | Freq: Once | RESPIRATORY_TRACT | Status: AC
  Administered 2016-09-19: 2 via RESPIRATORY_TRACT
  Filled 2016-09-19: qty 6.7

## 2016-09-19 MED ORDER — LEVOFLOXACIN 750 MG PO TABS: 750.0000 mg | ORAL_TABLET | Freq: Every day | ORAL | 0 refills | Status: AC

## 2016-09-19 NOTE — Discharge Instructions (Signed)
We believe that your symptoms are caused today by pneumonia, an infection in your lung(s).  Fortunately you should start to improve quickly after taking your antibiotics.  Please take the full course of antibiotics as prescribed and drink plenty of fluids.   ° °Follow up with your doctor within 1-2 days.  If you develop any new or worsening symptoms, including but not limited to fever in spite of taking over-the-counter ibuprofen and/or Tylenol, persistent vomiting, worsening shortness of breath, or other symptoms that concern you, please return to the Emergency Department immediately.  ° ° °Pneumonia °Pneumonia is an infection of the lungs.  °CAUSES °Pneumonia may be caused by bacteria or a virus. Usually, these infections are caused by breathing infectious particles into the lungs (respiratory tract). °SIGNS AND SYMPTOMS  °Cough. °Fever. °Chest pain. °Increased rate of breathing. °Wheezing. °Mucus production. °DIAGNOSIS  °If you have the common symptoms of pneumonia, your health care provider will typically confirm the diagnosis with a chest X-ray. The X-ray will show an abnormality in the lung (pulmonary infiltrate) if you have pneumonia. Other tests of your blood, urine, or sputum may be done to find the specific cause of your pneumonia. Your health care provider may also do tests (blood gases or pulse oximetry) to see how well your lungs are working. °TREATMENT  °Some forms of pneumonia may be spread to other people when you cough or sneeze. You may be asked to wear a mask before and during your exam. Pneumonia that is caused by bacteria is treated with antibiotic medicine. Pneumonia that is caused by the influenza virus may be treated with an antiviral medicine. Most other viral infections must run their course. These infections will not respond to antibiotics.  °HOME CARE INSTRUCTIONS  °Cough suppressants may be used if you are losing too much rest. However, coughing protects you by clearing your lungs. You  should avoid using cough suppressants if you can. °Your health care provider may have prescribed medicine if he or she thinks your pneumonia is caused by bacteria or influenza. Finish your medicine even if you start to feel better. °Your health care provider may also prescribe an expectorant. This loosens the mucus to be coughed up. °Take medicines only as directed by your health care provider. °Do not smoke. Smoking is a common cause of bronchitis and can contribute to pneumonia. If you are a smoker and continue to smoke, your cough may last several weeks after your pneumonia has cleared. °A cold steam vaporizer or humidifier in your room or home may help loosen mucus. °Coughing is often worse at night. Sleeping in a semi-upright position in a recliner or using a couple pillows under your head will help with this. °Get rest as you feel it is needed. Your body will usually let you know when you need to rest. °PREVENTION °A pneumococcal shot (vaccine) is available to prevent a common bacterial cause of pneumonia. This is usually suggested for: °People over 65 years old. °Patients on chemotherapy. °People with chronic lung problems, such as bronchitis or emphysema. °People with immune system problems. °If you are over 65 or have a high risk condition, you may receive the pneumococcal vaccine if you have not received it before. In some countries, a routine influenza vaccine is also recommended. This vaccine can help prevent some cases of pneumonia. You may be offered the influenza vaccine as part of your care. °If you smoke, it is time to quit. You may receive instructions on how to stop smoking. Your   health care provider can provide medicines and counseling to help you quit. °SEEK MEDICAL CARE IF: °You have a fever. °SEEK IMMEDIATE MEDICAL CARE IF:  °Your illness becomes worse. This is especially true if you are elderly or weakened from any other disease. °You cannot control your cough with suppressants and are losing  sleep. °You begin coughing up blood. °You develop pain which is getting worse or is uncontrolled with medicines. °Any of the symptoms which initially brought you in for treatment are getting worse rather than better. °You develop shortness of breath or chest pain. °MAKE SURE YOU:  °Understand these instructions. °Will watch your condition. °Will get help right away if you are not doing well or get worse. °Document Released: 06/27/2005 Document Revised: 11/11/2013 Document Reviewed: 09/16/2010 °ExitCare® Patient Information ©2015 [employer]. This information is not intended to replace advice given to you by your health care provider. Make sure you discuss any questions you have with your health care provider. ° ° ° °

## 2016-09-19 NOTE — ED Provider Notes (Signed)
Emergency Department Provider Note   I have reviewed the triage vital signs and the nursing notes.   HISTORY  Chief Complaint side pain, rash, swelling feet   HPI [name] is a 82 y.o. female with PMH of CAD, HTN, HLD, and COPD presents to the emergency department for evaluation of left-sided chest pain for the past 3 days is worse with breathing or movement. She notes no worsening dyspnea has had some coughing. No fevers or chills. Feels the symptoms are worsening slightly. She states her significantly worse at night. No associated nausea, vomiting, or diarrhea. Patient is on oxygen at home and has not had increase her oxygen requirement. No falls or injury to the chest wall. No history of blood clot in the legs or lungs. No radiating symptoms.    Past Medical History:  Diagnosis Date  . Actinomycosis, cervicofacial   . CAD (coronary artery disease)    a. coronary calcification on prior Chest CT;  b. Dob MV 1/14:  EF 85%, no ischemia  . COPD (chronic obstructive pulmonary disease) ([place])   . Emphysema   . HTN (hypertension)   . Hx of echocardiogram    Echocardiogram 04/16/12: Mild focal basal septal hypertrophy, EF 70-75% with dynamic obstruction with mid cavity obliteration, grade 1 diastolic dysfunction, MAC, atrial septal thickening with findings consistent with lipomatous hypertrophy  . Hyperlipidemia   . PVD (peripheral vascular disease) [facility])     Patient Active Problem List   Diagnosis Date Noted  . Shortness of breath 07/29/2016  . Acute diastolic heart failure ([place]) 01/27/2016  . Acute on chronic diastolic CHF (congestive heart failure), NYHA class 3 ([place]) 01/27/2016  . DOE (dyspnea on exertion) 01/27/2016  . Hypertensive heart disease with CHF (congestive heart failure) ([place]) 01/27/2016  . Diastolic dysfunction 08/65/7846  . CAD in native artery   . HLD (hyperlipidemia)   . Anxiety state   . Hypertrophic cardiomyopathy ([place])   . Malnutrition of moderate  degree 09/14/2015  . Elevated troponin   . Respiratory distress 09/09/2015  . Acute on chronic respiratory failure with hypoxia and hypercapnia ([place]) 09/09/2015  . Sepsis ([place]) 09/09/2015  . Leukocytosis   . Elevated troponin I measurement   . CAD (coronary artery disease), native coronary artery 01/17/2013  . Carotid stenosis 05/09/2012  . Dynamic left ventricular outflow obstruction 04/19/2012  . Multiple lung nodules 04/19/2012  . Hyperlipidemia 04/16/2012  . Hypoxemia 04/16/2012  . Cardiorespiratory distress syndrome of newborn 04/16/2012  . Acute respiratory failure ([place]) 07/09/2011  . HTN (hypertension) 07/08/2011  . Carotid artery disease ([place]) 03/03/2011  . Carotid artery obstruction 02/14/2011  . COPD (chronic obstructive pulmonary disease) ([place]) 06/15/2010  . DYSPNEA 06/15/2010  . OP (osteoporosis) 01/29/2010  . PVD 09/29/2008  . Aortic aneurysm ([place]) 03/10/2008  . Phlebitis and thrombophlebitis of superficial veins of upper extremities 10/17/2007  . Embolism and thrombosis of artery ([place]) 09/08/2007  . Anemia, iron deficiency 09/08/2007  . Fast heart beat 09/08/2007  . Embolism and thrombosis of other specified artery(444.89) 09/08/2007  . Anxiety 01/09/2007  . CANDIDIASIS, VAGINAL 12/12/2006  . ACTINOMYCOSIS, CERVICOFACIAL 12/01/2006  . EMPHYSEMA NEC 12/01/2006    Past Surgical History:  Procedure Laterality Date  . APPENDECTOMY    . BONE GRAFT HIP ILIAC CREST    . TONSILLECTOMY      Current Outpatient Rx  . Order #: [id number] Class: Historical Med  . Order #: [id number] Class: Historical Med  . Order #: [id number] Class: Historical Med  . Order #:  [id number] Class: Normal  . Order #: [id number] Class: Normal  . Order #: [id number] Class: Sample  . Order #: [id number] Class: Historical Med  . Order #: [id number] Class: Phone In  . Order #: [id number] Class: Normal  . Order #: [id number] Class: Normal  . [START ON 09/20/2016] Order #: [id number] Class: Print  . Order #:  [id number] Class: Normal  . Order #: [id number] Class: Historical Med  . Order #: [id number] Class: Historical Med  . Order #: [id number] Class: Normal  . Order #: [id number] Class: Normal  . Order #: [id number] Class: Historical Med    Allergies Penicillins; Pneumococcal vaccine; and Pneumovax [pneumococcal polysaccharide vaccine]  Family History  Problem Relation Age of Onset  . Heart disease Mother   . Emphysema Father   . Heart disease Father   . Stomach cancer Father   . Coronary artery disease      family hx of female 1st. degree relative <50/family hx of 1st. degree relative <60  . Emphysema Sister     SMOKER  . Stomach cancer Sister   . Emphysema Sister     SMOKER  . Coronary artery disease Brother     SMOKER  . Emphysema Sister     Social History Social History  Substance Use Topics  . Smoking status: Former Smoker    Packs/day: 1.00    Years: 49.00    Types: Cigarettes    Quit date: 07/11/2005  . Smokeless tobacco: Never Used     Comment: smoked for 48 years 1 ppd  . Alcohol use No    Review of Systems  Constitutional: No fever/chills Eyes: No visual changes. ENT: No sore throat. Cardiovascular: Positive chest pain. Respiratory: Baseline shortness of breath. Gastrointestinal: No abdominal pain.  No nausea, no vomiting.  No diarrhea.  No constipation. Genitourinary: Negative for dysuria. Musculoskeletal: Negative for back pain. Skin: Negative for rash. Neurological: Negative for headaches, focal weakness or numbness.  10-point ROS otherwise negative.  ____________________________________________   PHYSICAL EXAM:  VITAL SIGNS: ED Triage Vitals [09/19/16 1130]  Enc Vitals Group     BP 130/70     Pulse Rate 88     Resp 24     Temp 98.6 F (37 C)     Temp src      SpO2 98 %   Constitutional: Alert and oriented. Well appearing and in no acute distress. Eyes: Conjunctivae are normal. Head: Atraumatic. Nose: No congestion/rhinnorhea. Mouth/Throat: Mucous  membranes are moist.  Oropharynx non-erythematous. Neck: No stridor.  Cardiovascular: Normal rate, regular rhythm. Good peripheral circulation. Grossly normal heart sounds.   Respiratory: Normal respiratory effort.  No retractions. Lungs CTAB. Gastrointestinal: Soft and nontender. No distention.  Musculoskeletal: No lower extremity tenderness nor edema. No gross deformities of extremities. Neurologic:  Normal speech and language. No gross focal neurologic deficits are appreciated.  Skin:  Skin is warm, dry and intact. No rash noted. Psychiatric: Mood and affect are normal. Speech and behavior are normal.  ____________________________________________   LABS (all labs ordered are listed, but only abnormal results are displayed)  Labs Reviewed  CBC - Abnormal; Notable for the following:       Result Value   WBC 10.8 (*)    Hemoglobin 11.9 (*)    All other components within normal limits  COMPREHENSIVE METABOLIC PANEL - Abnormal; Notable for the following:    Chloride 96 (*)    Glucose, Bld 114 (*)    All other components within normal limits  BRAIN NATRIURETIC PEPTIDE  I-STAT TROPOININ, ED   ____________________________________________  EKG  EKG Interpretation  Date/Time:  Monday September 19 2016 11:08:02 EDT Ventricular Rate:  91 PR Interval:  116 QRS Duration: 66 QT Interval:  354 QTC Calculation: 435 R Axis:   85 Text Interpretation:  Normal sinus rhythm Normal ECG No STEMI.  Confirmed by Ryn Peine MD, Imara Standiford [phone]) on 09/19/2016 4:09:42 PM       ____________________________________________  RADIOLOGY  Dg Chest 2 View  Result Date: 09/19/2016 CLINICAL DATA:  Left lateral lower chest pain for the past 3 days. Shortness of breath. COPD. EXAM: CHEST  2 VIEW COMPARISON:  12/07/2015. FINDINGS: Normal sized heart. Stable scarring in the right upper lobe. Interval mild patchy opacity in the left lower lobe. The remainder the lungs are hyperexpanded with mild diffuse  peribronchial thickening. Diffuse osteopenia and mild thoracic spine degenerative changes. IMPRESSION: 1. Small amount of patchy airspace opacity in the left lower lobe posteriorly. This could represent atelectasis, pneumonia or an area of pulmonary infarction. A mass is less likely. If the patient has clinical evidence of pneumonia, followup PA and lateral chest X-ray would be recommended in 3-4 weeks following trial of antibiotic therapy to ensure resolution and exclude underlying malignancy. If the patient does not have clinical evidence of pneumonia, recommend consideration of a chest CTA with contrast. 2. Stable changes of COPD and chronic bronchitis. Electronically Signed   By: Claudie Revering M.D.   On: 09/19/2016 12:35   Ct Angio Chest Pe W And/or Wo Contrast  Result Date: 09/19/2016 CLINICAL DATA:  Patient with chest pain and shortness of breath. EXAM: CT ANGIOGRAPHY CHEST WITH CONTRAST TECHNIQUE: Multidetector CT imaging of the chest was performed using the standard protocol during bolus administration of intravenous contrast. Multiplanar CT image reconstructions and MIPs were obtained to evaluate the vascular anatomy. CONTRAST:  100 cc Isovue 370 COMPARISON:  CT chest 11/26/2014 FINDINGS: Cardiovascular: Normal heart size. No pericardial effusion. Peripheral calcified atherosclerotic plaque involving the thoracic aorta. Adequate opacification of the pulmonary arterial system. No evidence for pulmonary embolus. Mediastinum/Nodes: The esophagus is unremarkable. No enlarged axillary, mediastinal or hilar lymphadenopathy. Lungs/Pleura: Expiratory phase imaging. Central airways are patent. Mild motion artifact particularly within the left lower lobe. Centrilobular and paraseptal emphysematous changes. Grossly unchanged bandlike opacity within the right upper lobe, favored to represent scarring (image 80; series 4 too). Interval decrease in size of previously described right upper lobe nodules with a reference  nodule measuring 5 mm, previously 7 mm (image 63; series 407). Unchanged 3 mm right upper lobe subpleural nodule (image 36; series 47). Calcified nodule anterior left upper lobe (image 45; series 407). Interval development of a 3.1 x 2.0 cm focal area of consolidation with peripheral ground-glass opacities within the left lower lobe (image 114; series 407). No pleural effusion or pneumothorax. Upper Abdomen: No acute process. Musculoskeletal: Thoracic spine degenerative changes. No aggressive or acute appearing osseous lesions. Review of the MIP images confirms the above findings. IMPRESSION: Interval development of a 3.1 cm area of focal consolidation with peripheral ground-glass opacities in the left lower lobe raising the possibility of pneumonia in the appropriate clinical setting. Underlying pulmonary mass is not entirely excluded. Recommend follow-up imaging in 6 weeks to assess for interval resolution. No evidence for pulmonary embolus. Grossly unchanged nodular thickening within the right upper lobe favored to represent an area of scarring given stability over time. Electronically Signed   By: Lovey Newcomer M.D.   On: 09/19/2016 16:01    ____________________________________________   PROCEDURES  Procedure(s) performed:   Procedures  None  ____________________________________________   INITIAL IMPRESSION / ASSESSMENT AND PLAN / ED COURSE  Pertinent labs & imaging results that were available during my care of the patient were reviewed by me and considered in my medical decision making (see chart for details).  Patient with past medical history of COPD presents with chest pain for the past several days. Symptoms are worse at night. Not currently having chest pain. No suspicion for pneumonia with a few clinical symptoms of this. Plan for chest x-ray, troponin, BNP, and reassessment.   Differential includes all life-threatening causes for chest pain. This includes but is not exclusive to acute  coronary syndrome, aortic dissection, pulmonary embolism, cardiac tamponade, community-acquired pneumonia, pericarditis, musculoskeletal chest wall pain, etc.  04:10 PM CT angiogram of the chest shows a groundglass opacity in the left lower lobe concerning for developing pneumonia. The patient does not have a strong clinical history to suggest pneumonia other than pain. She has no increased oxygen requirement here in the emergency department. No respiratory distress. Focal area of infection could cause discomfort and given her respiratory history of plan to treat with Levaquin for the next 5 days. Patient, despite her age, has no clear indication for admission at this time. I plan to treat with antibiotics and have her follow with her primary care physician in the next 3-4 days.   At this time, I do not feel there is any life-threatening condition present. I have reviewed and discussed all results (EKG, imaging, lab, urine as appropriate), exam findings with patient. I have reviewed nursing notes and appropriate previous records.  I feel the patient is safe to be discharged home without further emergent workup. Discussed usual and customary return precautions. Patient and family (if present) verbalize understanding and are comfortable with this plan.  Patient will follow-up with their primary care provider. If they do not have a primary care provider, information for follow-up has been provided to them. All questions have been answered.  ____________________________________________  FINAL CLINICAL IMPRESSION(S) / ED DIAGNOSES  Final diagnoses:  Pleurodynia  Community acquired pneumonia of left lower lobe of lung ([place])     MEDICATIONS GIVEN DURING THIS VISIT:  Medications  levofloxacin (LEVAQUIN) tablet 750 mg (not administered)  albuterol (PROVENTIL HFA;VENTOLIN HFA) 108 (90 Base) MCG/ACT inhaler 2 puff (2 puffs Inhalation Given 09/19/16 1355)  iopamidol (ISOVUE-370) 76 % injection (100 mLs   Contrast Given 09/19/16 1521)     NEW OUTPATIENT MEDICATIONS STARTED DURING THIS VISIT:  New Prescriptions   LEVOFLOXACIN (LEVAQUIN) 750 MG TABLET    Take 1 tablet (750 mg total) by mouth daily.      Note:  This document was prepared using Dragon voice recognition software and may include unintentional dictation errors.  Nanda Quinton, MD Emergency Medicine   Margette Fast, MD 09/19/16 [phone]

## 2016-09-19 NOTE — ED Triage Notes (Signed)
Pt arrives via [facility] from home with left side pain for the last several days. States worse with breathing and movement. Pt reports today noticed feet swelling. Pt also concerned about rash to chest since this AM. Wears home O2 at 3L. VSS.

## 2016-09-19 NOTE — ED Notes (Signed)
Patient transported to CT 

## 2016-09-19 NOTE — ED Notes (Signed)
Patient Alert and oriented X4. Stable and ambulatory. Patient verbalized understanding of the discharge instructions.  Patient belongings were taken by the patient.  

## 2016-09-19 NOTE — ED Notes (Signed)
This RN [facility]  Patient is currently A&Ox4 with stable vitals.  Patient is waiting on CT angio.

## 2016-09-20 ENCOUNTER — Other Ambulatory Visit: Payer: Self-pay

## 2016-09-20 NOTE — Patient Outreach (Signed)
[facility] Care Management  [facility]  09/20/2016   [name] 04-Jul-1935 [id number]  Subjective: Telephone call to patient for monthly call. Patient noted to have gone to the ER and was diagnosed with pneumonia.  Patient reports that she did go to the ER after calling primary doctor office and was advised to go to the emergency room. Patient reports she only has the pain on her side when she takes a deep breath sometimes at this point.  Advised patient on pneumonia and worsening symptoms and importance of deep breaths.  Also advised patient to follow up with her primary care physician in the next few days.  She verbalized understanding.    Objective:   Encounter Medications:  Outpatient Encounter Prescriptions as of 09/20/2016  Medication Sig Note  . albuterol (PROVENTIL HFA;VENTOLIN HFA) 108 (90 BASE) MCG/ACT inhaler Inhale 2 puffs into the lungs every 6 (six) hours as needed. For shortness of breath   . aspirin 81 MG tablet Take 81 mg by mouth daily.     Marland Kitchen. aspirin-acetaminophen-caffeine (EXCEDRIN MIGRAINE) 250-250-65 MG tablet Take 1 tablet by mouth every 6 (six) hours as needed for headache.   . budesonide-formoterol (SYMBICORT) 160-4.5 MCG/ACT inhaler INHALE 2 PUFFS INTO THE LUNGS 2 (TWO) TIMES DAILY.   . Calcium Carb-Cholecalciferol (CALCIUM 600 + D PO) Take 1 tablet by mouth daily.   . clonazePAM (KLONOPIN) 0.5 MG tablet Take 0.5 tablets (0.25 mg total) by mouth 2 (two) times daily.   . furosemide (LASIX) 20 MG tablet Take 1 tablet (20 mg total) by mouth 2 (two) times daily. Take 1 tab at 8 am and the 2nd tab at 2 pm   . isosorbide mononitrate (IMDUR) 30 MG 24 hr tablet Take 0.5 tablets (15 mg total) by mouth daily.   Marland Kitchen. levofloxacin (LEVAQUIN) 750 MG tablet Take 1 tablet (750 mg total) by mouth daily.   Marland Kitchen. losartan (COZAAR) 25 MG tablet Take 1 tablet (25 mg total) by mouth daily.   . Magnesium 500 MG CAPS Take 1 capsule by mouth daily.   . Multiple  Vitamin (MULTIVITAMIN) tablet Take 1 tablet by mouth daily.     . nebivolol (BYSTOLIC) 5 MG tablet Take 1 tablet (5 mg total) by mouth daily.   . Tiotropium Bromide Monohydrate (SPIRIVA RESPIMAT) 2.5 MCG/ACT AERS INHALE 2 PUFFS INTO THE LUNGS DAILY.   Marland Kitchen. UNABLE TO FIND Inhale 1.5-3 L into the lungs continuous.  09/20/2016: OXYGEN  . atorvastatin (LIPITOR) 20 MG tablet Take 1 tablet (20 mg total) by mouth daily.   . budesonide-formoterol (SYMBICORT) 160-4.5 MCG/ACT inhaler Inhale 2 puffs into the lungs 2 (two) times daily. (Patient not taking: Reported on 08/22/2016) 09/20/2016: DUPLICATE   No facility-administered encounter medications on file as of 09/20/2016.     Functional Status:  In your present state of health, do you have any difficulty performing the following activities: 11/11/2015 10/30/2015  Hearing? Malvin JohnsY Y  Vision? N N  Difficulty concentrating or making decisions? N N  Walking or climbing stairs? Y Y  Dressing or bathing? N N  Doing errands, shopping? Malvin JohnsY Y  Preparing Food and eating ? Y Y  Using the Toilet? N N  In the past six months, have you accidently leaked urine? N N  Do you have problems with loss of bowel control? N N  Managing your Medications? N N  Managing your Finances? N N  Housekeeping or managing your Housekeeping? Y Y  Some recent data might be  hidden    Fall/Depression Screening: PHQ 2/9 Scores 09/20/2016 08/22/2016 07/05/2016 05/12/2016 04/12/2016 03/17/2016 02/01/2016  PHQ - 2 Score 0 0 0 0 0 0 0  PHQ- 9 Score - - - - - - -    Assessment: Patient continues to benefit from health coach outreach for disease management and support.   Plan:  [facility] CM Care Plan Problem One   Flowsheet Row Most Recent Value  Care Plan Problem One  COPD knowledge deficit  Role Documenting the Problem One  Health Coach  Care Plan for Problem One  Active  [facility] Long Term Goal (31-90 days)  Patient will be able to explain COPD zones on action plan within the next 90 days   [facility] Long Term Goal  Start Date  09/20/16 Staci Righter continued]  Interventions for Problem One Long Term Goal  RN Health Coach reinforced with patient COPD action plan and purpose.       RN Health Coach will contact patient in the month of April and patient agrees to next outreach.  Bary Leriche, RN, MSN [facility] Care Management RN Telephonic Health Coach [phone]

## 2016-09-21 ENCOUNTER — Other Ambulatory Visit: Payer: Self-pay

## 2016-09-21 NOTE — Patient Outreach (Signed)
[facility]) Care Management  09/21/2016  Sheri SleeperHazel H Mcdonald 1935/04/27 [id number]   Return phone call to patient after husband left a message.  Spoke with spouse he states that patient had a restless night and patient is having some problems with her breathing.  He states he is not sure if she is having a reaction to the medication or she is getting worse. Advised spouse that primary physician will need to be notified of patient changes to possibly be evaluated today.  He verbalized understanding and feels the same and states that going to primary physician will be easier for patient.    Plan RN Health Coach will follow up with patient again in the month of March.  Sheri Lericheionne J Sawyer Mentzer, RN, MSN [facility] Care Management RN Telephonic Health Coach [phone]

## 2016-09-30 ENCOUNTER — Other Ambulatory Visit: Payer: Self-pay

## 2016-09-30 NOTE — Patient Outreach (Signed)
[facility]N) Care Management  09/30/2016  [name] May 01, 1935 [id number]  Telephone call for follow up.  No answer.  HIPAA compliant voice message left.    Plan: RN Health Coach will attempt patient in the month of April.    Bary Lericheionne J Bhavana Kady, RN, MSN [facility] Care Management RN Telephonic Health Coach [phone]

## 2016-10-10 ENCOUNTER — Other Ambulatory Visit: Payer: Self-pay | Admitting: Emergency Medicine

## 2016-10-20 ENCOUNTER — Other Ambulatory Visit: Payer: Self-pay | Admitting: Emergency Medicine

## 2016-11-15 ENCOUNTER — Other Ambulatory Visit: Payer: Self-pay

## 2016-11-15 NOTE — Patient Outreach (Signed)
[facility]) Care Management  11/15/2016  Sheri SleeperHazel H Mcdonald [birth year] [id number]   Telephone call to patient for monthly call.  No answer.  HIPAA compliant voice message left.  Plan: RN Health Coach will attempt patient again in the month of May.    Bary Lericheionne J Vitalia Stough, RN, MSN [facility] Care Management RN Telephonic Health Coach [phone]

## 2016-11-29 ENCOUNTER — Other Ambulatory Visit: Payer: Self-pay

## 2016-11-29 NOTE — Patient Outreach (Signed)
[facility]) Care Management  11/29/2016  [name] [birth year] [id number]   3rd Telephone call to patient for monthly call.  No answer.  HIPAA compliant voice message left.  Plan: RN Health Coach will send letter to attempt outreach.  If no response after ten business days with proceed with case closure.    Bary Lericheionne J Reine Bristow, RN, MSN [facility] Care Management RN Telephonic Health Coach [phone]

## 2016-12-01 ENCOUNTER — Other Ambulatory Visit: Payer: Self-pay

## 2016-12-01 DIAGNOSIS — E784 Other hyperlipidemia: Secondary | ICD-10-CM | POA: Diagnosis not present

## 2016-12-01 DIAGNOSIS — J449 Chronic obstructive pulmonary disease, unspecified: Secondary | ICD-10-CM | POA: Diagnosis not present

## 2016-12-01 DIAGNOSIS — N183 Chronic kidney disease, stage 3 (moderate): Secondary | ICD-10-CM | POA: Diagnosis not present

## 2016-12-01 DIAGNOSIS — R0602 Shortness of breath: Secondary | ICD-10-CM | POA: Diagnosis not present

## 2016-12-01 DIAGNOSIS — J984 Other disorders of lung: Secondary | ICD-10-CM | POA: Diagnosis not present

## 2016-12-01 DIAGNOSIS — L298 Other pruritus: Secondary | ICD-10-CM | POA: Diagnosis not present

## 2016-12-01 NOTE — Patient Outreach (Signed)
[facility]) Care Management  12/01/2016  Terald SleeperHazel H Quraishi Jun 08, 1935 [id number]   Return phone call to patient for monthly outreach. No answer.  HIPAA compliant voice message left.  Plan: RN Health Coach will await patient return call.  If no return call will proceed with original case closure.    Bary Lericheionne J Jace Dowe, RN, MSN [facility] Care Management RN Telephonic Health Coach [phone]

## 2016-12-02 ENCOUNTER — Other Ambulatory Visit: Payer: Self-pay | Admitting: Internal Medicine

## 2016-12-02 ENCOUNTER — Other Ambulatory Visit: Payer: Self-pay

## 2016-12-02 DIAGNOSIS — R918 Other nonspecific abnormal finding of lung field: Secondary | ICD-10-CM

## 2016-12-02 NOTE — Patient Outreach (Addendum)
[facility]) Care Management  [facility]  12/02/2016   Sheri SleeperHazel H Mcdonald Jul 07, 1935 [id number]  Subjective: Incoming return call from patient.  Patient reports that she is having increased shortness of breath and saw physician on yesterday and was placed on prednisone and Levaquin.  She also reports that a CT scan was ordered for Tuesday as the physician thinks that her pneumonia is still around. Patient reports she had a Hypertensive crisis in the physician office on yesterday as well but it did go down.  She reports some jittery feelings.  Advised patient that her prednisone can cause some nervous feelings.  She verbalized understanding. Discussed with patient worsening symptoms and when to notify physician.  She verbalized understanding.   Objective:   Encounter Medications:  Outpatient Encounter Prescriptions as of 12/02/2016  Medication Sig Note  . albuterol (PROVENTIL HFA;VENTOLIN HFA) 108 (90 BASE) MCG/ACT inhaler Inhale 2 puffs into the lungs every 6 (six) hours as needed. For shortness of breath   . aspirin 81 MG tablet Take 81 mg by mouth daily.     Marland Kitchen. aspirin-acetaminophen-caffeine (EXCEDRIN MIGRAINE) 250-250-65 MG tablet Take 1 tablet by mouth every 6 (six) hours as needed for headache.   . Calcium Carb-Cholecalciferol (CALCIUM 600 + D PO) Take 1 tablet by mouth daily.   . clonazePAM (KLONOPIN) 0.5 MG tablet Take 0.5 tablets (0.25 mg total) by mouth 2 (two) times daily.   Marland Kitchen. levofloxacin (LEVAQUIN) 500 MG tablet Take 500 mg by mouth daily.   Marland Kitchen. losartan (COZAAR) 25 MG tablet Take 1 tablet (25 mg total) by mouth daily.   . Magnesium 500 MG CAPS Take 1 capsule by mouth daily.   . Multiple Vitamin (MULTIVITAMIN) tablet Take 1 tablet by mouth daily.     . nebivolol (BYSTOLIC) 5 MG tablet Take 1 tablet (5 mg total) by mouth daily.   . predniSONE (DELTASONE) 20 MG tablet Take 20 mg by mouth daily with breakfast. Take 2 tablets  For 2 days and then 1 tablet for 7 days.    Marland Kitchen. SPIRIVA RESPIMAT 2.5 MCG/ACT AERS INHALE 2 PUFFS INTO THE LUNGS DAILY.   . SYMBICORT 160-4.5 MCG/ACT inhaler INHALE 2 PUFFS INTO THE LUNGS 2 (TWO) TIMES DAILY.   Marland Kitchen. UNABLE TO FIND Inhale 1.5-3 L into the lungs continuous.  09/20/2016: OXYGEN  . atorvastatin (LIPITOR) 20 MG tablet Take 1 tablet (20 mg total) by mouth daily.   . budesonide-formoterol (SYMBICORT) 160-4.5 MCG/ACT inhaler Inhale 2 puffs into the lungs 2 (two) times daily. (Patient not taking: Reported on 08/22/2016) 09/20/2016: DUPLICATE  . furosemide (LASIX) 20 MG tablet Take 1 tablet (20 mg total) by mouth 2 (two) times daily. Take 1 tab at 8 am and the 2nd tab at 2 pm   . isosorbide mononitrate (IMDUR) 30 MG 24 hr tablet Take 0.5 tablets (15 mg total) by mouth daily.    No facility-administered encounter medications on file as of 12/02/2016.     Functional Status:  No flowsheet data found.  Fall/Depression Screening: Fall Risk  12/02/2016 09/20/2016 08/22/2016  Falls in the past year? No No No  Risk for fall due to : - - -  Risk for fall due to (comments): - - -   PHQ 2/9 Scores 12/02/2016 09/20/2016 08/22/2016 07/05/2016 05/12/2016 04/12/2016 03/17/2016  PHQ - 2 Score 0 0 0 0 0 0 0  PHQ- 9 Score - - - - - - -    Assessment: Patient continues to benefit from health coach  outreach for disease management and support.    Plan:  [facility] CM Care Plan Problem One     Most Recent Value  Care Plan Problem One  COPD knowledge deficit  Role Documenting the Problem One  Health Coach  Care Plan for Problem One  Active  [facility] Long Term Goal   Patient will be able to explain COPD zones on action plan within the next 90 days   [facility] Long Term Goal Start Date  12/02/16 [goal continued]  Interventions for Problem One Long Term Goal  RN Health Coach reviewed with patient COPD action plan and purpose.    [facility] CM Short Term Goal #1   Patient will report increased shortness of breath cleared within 30 days.  [facility] CM Short Term Goal #1 Start Date   12/02/16  Interventions for Short Term Goal #1  Patient to have CT Scan. Taking antibiotics. RN Health Coach discussed symptoms to notify physician.         RN Health Coach will contact patient in the month of June and patient agrees to next outreach.  Bary Leriche, RN, MSN [facility] Care Management RN Telephonic Health Coach [phone]

## 2016-12-06 ENCOUNTER — Ambulatory Visit
Admission: RE | Admit: 2016-12-06 | Discharge: 2016-12-06 | Disposition: A | Discharge status: Home / Self Care | Payer: PPO | Source: Ambulatory Visit | Attending: Internal Medicine | Admitting: Internal Medicine

## 2016-12-06 DIAGNOSIS — R918 Other nonspecific abnormal finding of lung field: Secondary | ICD-10-CM

## 2016-12-08 ENCOUNTER — Other Ambulatory Visit: Payer: Self-pay

## 2016-12-08 DIAGNOSIS — J9611 Chronic respiratory failure with hypoxia: Secondary | ICD-10-CM | POA: Diagnosis not present

## 2016-12-08 DIAGNOSIS — Z6823 Body mass index (BMI) 23.0-23.9, adult: Secondary | ICD-10-CM | POA: Diagnosis not present

## 2016-12-08 DIAGNOSIS — L818 Other specified disorders of pigmentation: Secondary | ICD-10-CM | POA: Diagnosis not present

## 2016-12-08 DIAGNOSIS — I7389 Other specified peripheral vascular diseases: Secondary | ICD-10-CM | POA: Diagnosis not present

## 2016-12-08 DIAGNOSIS — I10 Essential (primary) hypertension: Secondary | ICD-10-CM | POA: Diagnosis not present

## 2016-12-08 DIAGNOSIS — Z1389 Encounter for screening for other disorder: Secondary | ICD-10-CM | POA: Diagnosis not present

## 2016-12-08 DIAGNOSIS — R12 Heartburn: Secondary | ICD-10-CM | POA: Diagnosis not present

## 2016-12-08 DIAGNOSIS — Z0000 Encounter for general adult medical examination without abnormal findings: Secondary | ICD-10-CM | POA: Diagnosis not present

## 2016-12-08 DIAGNOSIS — J984 Other disorders of lung: Secondary | ICD-10-CM | POA: Diagnosis not present

## 2016-12-08 DIAGNOSIS — Z9981 Dependence on supplemental oxygen: Secondary | ICD-10-CM | POA: Diagnosis not present

## 2016-12-08 DIAGNOSIS — I509 Heart failure, unspecified: Secondary | ICD-10-CM | POA: Diagnosis not present

## 2016-12-08 DIAGNOSIS — M859 Disorder of bone density and structure, unspecified: Secondary | ICD-10-CM | POA: Diagnosis not present

## 2016-12-08 NOTE — Patient Outreach (Signed)
[facility]) Care Management  12/08/2016  [name] 1934-07-31 [id number]   Telephone call to patient for follow up call.  No answer.  HIPAA compliant voice message left.    Plan: RN Health Coach will attempt again in the month of June.    Bary Lericheionne J Keelin Sheridan, RN, MSN [facility] Care Management RN Telephonic Health Coach [phone]

## 2016-12-10 ENCOUNTER — Other Ambulatory Visit: Payer: Self-pay | Admitting: Physician Assistant

## 2016-12-12 ENCOUNTER — Other Ambulatory Visit (HOSPITAL_COMMUNITY): Payer: Self-pay | Admitting: Internal Medicine

## 2016-12-12 ENCOUNTER — Encounter: Payer: Self-pay | Admitting: Emergency Medicine

## 2016-12-12 ENCOUNTER — Ambulatory Visit (INDEPENDENT_AMBULATORY_CARE_PROVIDER_SITE_OTHER): Payer: PPO | Admitting: Emergency Medicine

## 2016-12-12 DIAGNOSIS — L819 Disorder of pigmentation, unspecified: Secondary | ICD-10-CM

## 2016-12-12 DIAGNOSIS — J449 Chronic obstructive pulmonary disease, unspecified: Secondary | ICD-10-CM | POA: Diagnosis not present

## 2016-12-12 MED ORDER — BUDESONIDE-FORMOTEROL FUMARATE 160-4.5 MCG/ACT IN AERO: 2.0000 | INHALATION_SPRAY | Freq: Two times a day (BID) | RESPIRATORY_TRACT | 0 refills | Status: AC

## 2016-12-12 NOTE — Progress Notes (Signed)
Subjective:     Patient ID: [name], female   DOB: 11/27/1934, 82 y.o.   MRN: [id number]  HPI 82 yo woman, former smoker (50 pk-yrs), hx COPD, HTN and diastolic CHF, PVD. She was admitted for AE-COPD 10/7-10/11. CT scan during that admission showed scattered LL nodular disease.    ROV 01/05/16 -- [name] has a history of severe COPD and chronic hypoxemic respiratory failure. She also has a history of pulmonary nodules that we've followed with serial CT scans and deemed benign based on their stability. She is currently managed on Spiriva respimat, Symbicort. She was admitted in 3/17 for AE-COPD and resp failure. She believes that her breathing has improved. She has been dealing with labile BP, HTN. She is using O2 at 3L/min w exertion.      ROV 04/06/16 -- visit for history of severe COPD, chronic hypoxemic respiratory failure, pulmonary nodules were benign on serial scans. She uses Spiriva and Symbicort. She is on o2 at 3L/min. No flares since last time. She had a bad reaction to pneumovax in the past.   ROV 08/08/16 -- Follow-up visit for severe COPD with associated chronic hypoxemic respiratory failure. She is also been followed in the past with serial CT scans of the chest for nodular disease that has been stable and deemed benign. She's currently using  . She is quite limited, can walk 40 feet, has to stop with household chores. Oxygen at 3L/min at all times. She uses albuterol rarely. No cough or wheeze. No flares since last time. Flu shot up to date. She is looking into Stem Cell therapy for her COPD  ROV 12/12/16 -- Patient has a history of severe COPD, chronic hypoxemic respiratory failure, pulmonary nodular disease (benign by serial CTs). She reports that she was treated for a possible LLL PNA with pleuritic pain. A CT chest at that time showed a GG infiltrate in LLL. She states that her BP has been labile. She was dyspneic at her PCP's office 2 weeks ago, w abx and prednisone. She notes  improvement in dyspnea since then. Another CT chest 12/06/16 showed that the LLL GGI has cleared, all nodules remains stable. She remains on Symbicort, Spiriva. She has albuterol, uses about 0-2x a day. Her o2 is at 1.5-3L/min. She is having some UA noise, ? Wheeze.    No flowsheet data found.     Objective:   Physical Exam Vitals:   12/12/16 1345  BP: 106/66  Pulse: 77  SpO2: 99%  Weight: 125 lb 3.2 oz (56.8 kg)  Height: 5\' 1"  (1.549 m)   Gen: Pleasant, elderly woman, in no distress,  normal affect  ENT: No lesions,  mouth clear,  oropharynx clear, no postnasal drip  Neck: No JVD, no TMG, no carotid bruits  Lungs: No use of accessory muscles, distant, diminished BS in bases, no wheeze  Cardiovascular: RRR, heart sounds normal, no murmur or gallops, no peripheral edema  Musculoskeletal: No deformities, no cyanosis or clubbing  Neuro: alert, non focal  Skin: Warm, no lesions or rashes    CT scan chest , personally reviewed by me 11/26/14 --  COMPARISON: CT 05/08/2014 04/18/2012  FINDINGS: Mediastinum/Nodes: No axillary supraclavicular lymphadenopathy. No mediastinal hilar lymphadenopathy no pericardial fluid. Esophagus is normal.  Lungs/Pleura: The new sub solid nodule described on comparison CT is no longer measurable.  Additional solid nodules are not changed. For example 7 mm nodule in the right upper lobe on image 27, series 3 is unchanged. Adjacent 6  mm nodule is also unchanged (image 26).  More superiorly in the right upper lobe band of linear nodular thickening measures 5 mm compared to 7 mm (image 11, series 3. This right upper lobe linear nodularity first appear CT of 05/02/2013 and has lengthed and thinned.  There is additional scattered sub 5 mm nodules, stable. 3 mm left upper lobe nodule image 17, unchanged  Upper abdomen: Limited view of the liver, kidneys, pancreas are unremarkable. Normal adrenal glands.  Musculoskeletal: No aggressive  osseous lesion.  IMPRESSION: 1. Resolution of right upper lobe sub solid nodule. 2. Stable bilateral noncalcified nodules for greater than 2 years. 3. Band of linear nodular thickening at the right lung apex first presented on CT of 05/02/2013. This nodular thickening appears to continue to decrease in volume. Consider one additional follow-up for this right upper lobe bandlike nodular thickening in 6-12 months       Assessment:     COPD (chronic obstructive pulmonary disease) (HCC) Difficulty describing and quantifying her symptoms. She did have a recent period of left-sided pleuritic pain with a groundglass infiltrate although this was not consistent with a typical pneumonia. Infiltrate resolved on follow-up CT scan. She has significant exertional limitation, question whether she is adequately oxygenated on 3 L/m pulsed. May be getting to the point where she needs to be on daily prednisone? Consider changing her bronchodilator regimen, although she's been tried on an Anoro before without benefit.  We will perform a walking oximetry on 3L/min pulsed.  We will continue Spiriva and Symbicort as you are taking them  Take albuterol 2 puffs up to every 4 hours if needed for shortness of breath.  Follow with Dr Delton CoombesByrum in 3 months or sooner if you have any problems.   Levy Pupaobert Eilish Mcdaniel, MD, PhD 12/12/2016, 2:19 PM [facility] [phone] or if no answer [phone]

## 2016-12-12 NOTE — Addendum Note (Signed)
Addended by: Garfield CorneaMABRY, JASMINE L on: 12/12/2016 03:25 PM   Modules accepted: Orders

## 2016-12-12 NOTE — Patient Instructions (Addendum)
We will perform a walking oximetry on 3L/min pulsed.  We will continue Spiriva and Symbicort as you are taking them  Take albuterol 2 puffs up to every 4 hours if needed for shortness of breath.  Follow with Dr Delton CoombesByrum in 3 months or sooner if you have any problems.

## 2016-12-12 NOTE — Assessment & Plan Note (Addendum)
Difficulty describing and quantifying her symptoms. She did have a recent period of left-sided pleuritic pain with a groundglass infiltrate although this was not consistent with a typical pneumonia. Infiltrate resolved on follow-up CT scan. She has significant exertional limitation, question whether she is adequately oxygenated on 3 L/m pulsed. May be getting to the point where she needs to be on daily prednisone? Consider changing her bronchodilator regimen, although she's been tried on an Anoro before without benefit.  We will perform a walking oximetry on 3L/min pulsed.  We will continue Spiriva and Symbicort as you are taking them  Take albuterol 2 puffs up to every 4 hours if needed for shortness of breath.  Follow with Dr Delton CoombesByrum in 3 months or sooner if you have any problems.

## 2016-12-13 ENCOUNTER — Ambulatory Visit (HOSPITAL_COMMUNITY)
Admission: RE | Admit: 2016-12-13 | Discharge: 2016-12-13 | Disposition: A | Discharge status: Home / Self Care | Payer: PPO | Source: Ambulatory Visit | Attending: Vascular Surgery | Admitting: Vascular Surgery

## 2016-12-13 DIAGNOSIS — Z87891 Personal history of nicotine dependence: Secondary | ICD-10-CM | POA: Insufficient documentation

## 2016-12-13 DIAGNOSIS — I10 Essential (primary) hypertension: Secondary | ICD-10-CM | POA: Diagnosis not present

## 2016-12-13 DIAGNOSIS — L819 Disorder of pigmentation, unspecified: Secondary | ICD-10-CM | POA: Diagnosis not present

## 2016-12-13 DIAGNOSIS — E785 Hyperlipidemia, unspecified: Secondary | ICD-10-CM | POA: Insufficient documentation

## 2017-01-02 ENCOUNTER — Other Ambulatory Visit: Payer: Self-pay

## 2017-01-02 ENCOUNTER — Encounter: Payer: Self-pay | Admitting: Emergency Medicine

## 2017-01-02 NOTE — Patient Outreach (Signed)
[facility]) Care Management  [place]  01/02/2017   [name] Jan 31, 1935 [id number]  Subjective: Telephone call to patient for monthly call. Patient reports she is doing ok.  She reports some swelling to feet and ankles. She states she saw the pulmonologist but he was not concerned about the swelling. Patient to see cardiologist next month and will address then. Patient denies any new or increased shortness of breath from her usual and her weight is about the same.  Patient asked if she could take an extra lasix. Cautioned patient against doing that without checking with her physician.  She verbalized understanding.  Patient reports that her COPD is doing ok and nothing unusual. Discussed with patient COPD and when to notify physician.  She verbalized understanding.    Objective:   Encounter Medications:  Outpatient Encounter Prescriptions as of 01/02/2017  Medication Sig Note  . albuterol (PROVENTIL HFA;VENTOLIN HFA) 108 (90 BASE) MCG/ACT inhaler Inhale 2 puffs into the lungs every 6 (six) hours as needed. For shortness of breath   . aspirin 81 MG tablet Take 81 mg by mouth daily.     Marland Kitchen aspirin-acetaminophen-caffeine (EXCEDRIN MIGRAINE) 250-250-65 MG tablet Take 1 tablet by mouth every 6 (six) hours as needed for headache.   . budesonide-formoterol (SYMBICORT) 160-4.5 MCG/ACT inhaler Inhale 2 puffs into the lungs 2 (two) times daily.   Marland Kitchen BYSTOLIC 5 MG tablet TAKE 1 TABLET (5 MG TOTAL) BY MOUTH DAILY.   . Calcium Carb-Cholecalciferol (CALCIUM 600 + D PO) Take 1 tablet by mouth daily.   . clonazePAM (KLONOPIN) 0.5 MG tablet Take 0.5 tablets (0.25 mg total) by mouth 2 (two) times daily.   Marland Kitchen losartan (COZAAR) 25 MG tablet Take 1 tablet (25 mg total) by mouth daily.   . Magnesium 500 MG CAPS Take 1 capsule by mouth daily.   . Multiple Vitamin (MULTIVITAMIN) tablet Take 1 tablet by mouth daily.     Marland Kitchen SPIRIVA RESPIMAT 2.5 MCG/ACT AERS INHALE 2 PUFFS INTO THE LUNGS  DAILY.   Marland Kitchen UNABLE TO FIND Inhale 1.5-3 L into the lungs continuous.  09/20/2016: OXYGEN  . atorvastatin (LIPITOR) 20 MG tablet Take 1 tablet (20 mg total) by mouth daily. 01/02/2017: Patient taking.  . furosemide (LASIX) 20 MG tablet Take 1 tablet (20 mg total) by mouth 2 (two) times daily. Take 1 tab at 8 am and the 2nd tab at 2 pm 01/02/2017: Patient taking 20 mg BID  . isosorbide mononitrate (IMDUR) 30 MG 24 hr tablet Take 0.5 tablets (15 mg total) by mouth daily. 01/02/2017: Not taking  . SYMBICORT 160-4.5 MCG/ACT inhaler INHALE 2 PUFFS INTO THE LUNGS 2 (TWO) TIMES DAILY. 03/03/2352: DUPLICATE   No facility-administered encounter medications on file as of 01/02/2017.     Functional Status:  No flowsheet data found.  Fall/Depression Screening: Fall Risk  01/02/2017 12/02/2016 09/20/2016  Falls in the past year? No No No  Risk for fall due to : - - -  Risk for fall due to (comments): - - -   PHQ 2/9 Scores 01/02/2017 12/02/2016 09/20/2016 08/22/2016 07/05/2016 05/12/2016 04/12/2016  PHQ - 2 Score 0 0 0 0 0 0 0  PHQ- 9 Score - - - - - - -    Assessment: Patient continues to benefit from health coach outreach for disease management and support.    Plan:  [facility] CM Care Plan Problem One     Most Recent Value  Care Plan Problem One  COPD knowledge  deficit  Role Documenting the Problem One  [place] for Problem One  Active  [facility] Long Term Goal   Patient will be able to explain COPD zones on action plan within the next 90 days   [facility] Long Term Goal Start Date  01/02/17 Barrie Folk continued]  Interventions for Problem One Long Term Goal  RN Health Coach reiterated with patient COPD action plan and purpose.    [facility] CM Short Term Goal #1   Patient will report increased shortness of breath cleared within 30 days.  [facility] CM Short Term Goal #1 Start Date  12/02/16  [facility] CM Short Term Goal #1 Met Date  01/02/17  Interventions for Short Term Goal #1  goal met.  Shortness of breath back at baseline per  patient.      RN Health Coach will contact patient in the month of July and patient agrees to next outreach.  Jone Baseman, RN, MSN [place] [phone]

## 2017-01-04 ENCOUNTER — Ambulatory Visit: Payer: PPO

## 2017-01-23 ENCOUNTER — Other Ambulatory Visit: Payer: Self-pay

## 2017-01-23 NOTE — Patient Outreach (Signed)
[facility]) Care Management  01/23/2017  [name] 08/10/1934 [id number]   Telephone call to patient for monthly call.  No answer.  HIPAA compliant voice message left.  Plan: RN Health Coach will attempt patient again in the month of July.    Bary Lericheionne J Dannis Deroche, RN, MSN [facility] Care Management RN Telephonic Health Coach [phone]

## 2017-01-26 ENCOUNTER — Ambulatory Visit (INDEPENDENT_AMBULATORY_CARE_PROVIDER_SITE_OTHER): Payer: PPO | Admitting: Cardiology

## 2017-01-26 ENCOUNTER — Encounter: Payer: Self-pay | Admitting: Cardiology

## 2017-01-26 VITALS — BP 146/86 | HR 96 | Ht 61.0 in | Wt 126.0 lb

## 2017-01-26 DIAGNOSIS — J449 Chronic obstructive pulmonary disease, unspecified: Secondary | ICD-10-CM

## 2017-01-26 DIAGNOSIS — I110 Hypertensive heart disease with heart failure: Secondary | ICD-10-CM

## 2017-01-26 DIAGNOSIS — I5043 Acute on chronic combined systolic (congestive) and diastolic (congestive) heart failure: Secondary | ICD-10-CM | POA: Diagnosis not present

## 2017-01-26 DIAGNOSIS — I5033 Acute on chronic diastolic (congestive) heart failure: Secondary | ICD-10-CM

## 2017-01-26 DIAGNOSIS — I2510 Atherosclerotic heart disease of native coronary artery without angina pectoris: Secondary | ICD-10-CM | POA: Diagnosis not present

## 2017-01-26 DIAGNOSIS — I5031 Acute diastolic (congestive) heart failure: Secondary | ICD-10-CM | POA: Diagnosis not present

## 2017-01-26 LAB — BASIC METABOLIC PANEL
BUN/Creatinine Ratio: 22 (ref 12–28)
BUN: 19 mg/dL (ref 8–27)
CO2: 28 mmol/L (ref 20–29)
Calcium: 10.8 mg/dL — ABNORMAL HIGH (ref 8.7–10.3)
Chloride: 95 mmol/L — ABNORMAL LOW (ref 96–106)
Creatinine, Ser: 0.85 mg/dL (ref 0.57–1.00)
GFR calc Af Amer: 75 mL/min/{1.73_m2} (ref >59)
GFR calc non Af Amer: 65 mL/min/{1.73_m2} (ref >59)
Glucose: 111 mg/dL — ABNORMAL HIGH (ref 65–99)
Potassium: 5 mmol/L (ref 3.5–5.2)
Sodium: 140 mmol/L (ref 134–144)

## 2017-01-26 LAB — PRO B NATRIURETIC PEPTIDE: NT-Pro BNP: 91 pg/mL (ref 0–738)

## 2017-01-26 MED ORDER — FUROSEMIDE 40 MG PO TABS: 40.0000 mg | ORAL_TABLET | Freq: Two times a day (BID) | ORAL | 1 refills | Status: DC

## 2017-01-26 MED ORDER — LOSARTAN POTASSIUM 50 MG PO TABS: 50.0000 mg | ORAL_TABLET | Freq: Every day | ORAL | 2 refills | Status: DC

## 2017-01-26 NOTE — Patient Instructions (Signed)
Medication Instructions:   INCREASE YOUR LASIX TO 40 MG TWICE DAILY  INCREASE YOUR LOSARTAN TO 50 MG ONCE DAILY    Labwork:  TODAY---BMET AND PRO-BNP     Follow-Up:  1 WEEK WITH AN EXTENDER IN OUR OFFICE PER DR NELSON       If you need a refill on your cardiac medications before your next appointment, please call your pharmacy.

## 2017-01-26 NOTE — Progress Notes (Signed)
Cardiology Office Note    Date:  01/28/2017  ID:  [name], DOB Oct 04, 1934, MRN [id number] PCP:  Velna Hatchet, MD  Cardiologist:  Dr. Meda Coffee   Chief Complaint: 6 months follow up  History of Present Illness:  [name] is a 82 y.o. female with history of HTN, COPD, ?hypertrophic cardiomyopathy 2013, mild carotid artery disease, coronary calcification on CT 2013 (neg dobutamine nuc 2014, neg Lexiscan nuc 02/2014), chronic diastolic CHF, hyperlipidemia who presents for f/u of shortness of breath. She saw Dr. Verl Blalock 8/12 after undergoing a "Life Screening Survey" which reportedly demonstrated mild carotid artery stenosis bilaterally (no percentage given) and mildly reduced ABIs bilaterally (0.9). She has since gone on to have normal ABIs in 2015. She's had a question of infrarenal abdominal aorta dilatation on CT in the past but subsequent ultrasounds have been negative for aneurysm. She's not had any recent carotid duplex. Her last echo 09/2015 showed normal EF, mild focal basal hypertrophy of the septum, EF 65-70%, grade 1 DD (however, note: prior echo 2013 showed EF 70-75% with dynamic obstruction and mid-cavity obliteration). She is also followed by pulmonology. In 2017 she was hospitalized for a/c COPD exacerbation with PNA, hypoxia and hypercarbia. She was also treated for potential HF as well. She saw Dr. Meda Coffee 10/05/15 with worsening dyspnea on exertion that was felt multifactorial secondary to COPD and hypertrophic cardiomyopathy. She recommended continuing beta blockers. She felt if her symptoms continued to deteriorate she might consider left heart catheterization.  Last CXR 11/2015 demonstrated irregular linear opacity and two small pulmonary nodules not significantly changed from 11/2014. More recently the patient was seen by Dr. Meda Coffee on 07/15/16 with worsening lower extremity edema and shortness of braeth. Labs 07/15/16: normal CBC (Hgb11.9), pBNP 90, BUN 22, Cr 0.87, K 5.3, CO2 31. Dr.  Meda Coffee recommended to cut down Lasix to 83m BID and d/c potassium supplementation as she felt sx were more c/w pulm process.  01/26/17 - the patient states that she has been feeling profoundly SOB, she feels SOB at rest on 24/7 O2 at home and is also experiencing PND. She sleeps on 2 pillows. Mild LE edema. No chest pain. She is almost in tears. She also states taht her BP has been running high.  Past Medical History:  Diagnosis Date  . Actinomycosis, cervicofacial   . CAD (coronary artery disease)    a. coronary calcification on prior Chest CT;  b. Dob MV 1/14:  EF 85%, no ischemia  . COPD [place]   . Emphysema   . HTN (hypertension)   . Hx of echocardiogram    Echocardiogram 04/16/12: Mild focal basal septal hypertrophy, EF 70-75% with dynamic obstruction with mid cavity obliteration, grade 1 diastolic dysfunction, MAC, atrial septal thickening with findings consistent with lipomatous hypertrophy  . Hyperlipidemia   . PVD [place]ipheral vascular disease[place]     Past Surgical History:  Procedure Laterality Date  . APPENDECTOMY    . BONE GRAFT HIP ILIAC CREST    . TONSILLECTOMY      Current Medications: Current Outpatient Prescriptions  Medication Sig Dispense Refill  . albuterol (PROVENTIL HFA;VENTOLIN HFA) 108 (90 BASE) MCG/ACT inhaler Inhale 2 puffs into the lungs every 6 (six) hours as needed. For shortness of breath    . aspirin 81 MG tablet Take 81 mg by mouth daily.      .Marland Kitchenaspirin-acetaminophen-caffeine (EXCEDRIN MIGRAINE) 250-250-65 MG tablet Take 1 tablet by mouth every 6 (six) hours as  needed for headache.    Marland Kitchen atorvastatin (LIPITOR) 20 MG tablet Take 1 tablet (20 mg total) by mouth daily. 90 tablet 3  . budesonide-formoterol (SYMBICORT) 160-4.5 MCG/ACT inhaler Inhale 2 puffs into the lungs 2 (two) times daily. 2 Inhaler 0  . BYSTOLIC 5 MG tablet TAKE 1 TABLET (5 MG TOTAL) BY MOUTH DAILY. 90 tablet 1  . Calcium Carb-Cholecalciferol  (CALCIUM 600 + D PO) Take 1 tablet by mouth daily.    . clonazePAM (KLONOPIN) 0.5 MG tablet Take 0.5 tablets (0.25 mg total) by mouth 2 (two) times daily. 60 tablet 0  . Magnesium 500 MG CAPS Take 1 capsule by mouth daily.    . Multiple Vitamin (MULTIVITAMIN) tablet Take 1 tablet by mouth daily.      Marland Kitchen SPIRIVA RESPIMAT 2.5 MCG/ACT AERS INHALE 2 PUFFS INTO THE LUNGS DAILY. 12 g 3  . UNABLE TO FIND Inhale 1.5-3 L into the lungs continuous.     . furosemide (LASIX) 40 MG tablet Take 1 tablet (40 mg total) by mouth 2 (two) times daily. 90 tablet 1  . isosorbide mononitrate (IMDUR) 30 MG 24 hr tablet Take 0.5 tablets (15 mg total) by mouth daily. (Patient not taking: Reported on 01/26/2017) 30 tablet 5  . losartan (COZAAR) 50 MG tablet Take 1 tablet (50 mg total) by mouth daily. 90 tablet 2   No current facility-administered medications for this visit.      Allergies:   Penicillins; Pneumococcal vaccine; and Pneumovax [pneumococcal polysaccharide vaccine]   Social History   Social History  . Marital status: Married    Spouse name: N/A  . Number of children: 4  . Years of education: N/A   Occupational History  . retired Retired    Air cabin crew   Social History Main Topics  . Smoking status: Former Smoker    Packs/day: 1.00    Years: 49.00    Types: Cigarettes    Quit date: 07/11/2005  . Smokeless tobacco: Never Used     Comment: smoked for 48 years 1 ppd  . Alcohol use No  . Drug use: No  . Sexual activity: Not Asked   Other Topics Concern  . None   Social History Narrative  . None     Family History:  The patient's family history includes Coronary artery disease in her brother and unknown relative; Emphysema in her father, sister, sister, and sister; Heart disease in her father and mother; Stomach cancer in her father and sister.   ROS:   Please see the history of present illness. No recent travel, surgery, bedrest. No LE erythema or pain/tenderness. All other systems are  reviewed and otherwise negative.    PHYSICAL EXAM:   VS:  BP (!) 146/86   Pulse 96   Ht _0  (1.549 m)   Wt 126 lb (57.2 kg)   SpO2 97%   BMI 23.81 kg/m   BMI: Body mass index is 23.81 kg/m. GEN: Thin well developed WF in no acute distress  HEENT: normocephalic, atraumatic Neck: no JVD, carotid bruits, or masses Cardiac: RRR; no murmurs, rubs, or gallops, mild B/L edema  Respiratory:  B/L crackles at lung bases GI: soft, nontender, nondistended, + BS MS: no deformity or atrophy  Skin: warm and dry, no rash Neuro:  Alert and Oriented x 3, Strength and sensation are intact, follows commands Psych: euthymic mood, full affect  Wt Readings from Last 3 Encounters:  01/26/17 126 lb (57.2 kg)  12/12/16 125 lb 3.2 oz (  56.8 kg)  08/08/16 125 lb 3.2 oz (56.8 kg)      Studies/Labs Reviewed:   EKG:  EKG was ordered today and personally reviewed by me and demonstrates NSR,  85bpm, TWI avL. Rounded ST upsloped pattern inferiorly, unchanged from prior.   Recent Labs: 09/19/2016: ALT 16; B Natriuretic Peptide 38.9; Hemoglobin 11.9; Platelets 309 01/26/2017: BUN 19; Creatinine, Ser 0.85; NT-Pro BNP 91; Potassium 5.0; Sodium 140   Lipid Panel    Component Value Date/Time   CHOL 213 12/16/2008   TRIG 166 12/16/2008   HDL 62 12/16/2008   LDLCALC 116 12/16/2008    Additional studies/ records that were reviewed today include: Summarized above.    ASSESSMENT & PLAN:   1. Shortness of breath - suspect predominantly due to COPD. Echo in 09/2015 did not show any significant hypertrophic cardiomyopathy or obstruction, despite prior diagnosis of this. She appears to be fluid overloaded today, I will increase her Lasix to 40 mg po BID. BNP today 91 - normal. Crea 0.89, K 5.0. Follow up in 1 week. 2. Coronary calcification on CT - prior negative stress testing as above. Imdur didn't improve DOE, I suspect dyspnea primarily due to COPD. 3. Acute on Chronic diastolic CHF - as  above. 4. Hypertrophic cardiomyopathy - most recent echocardiogram did not suggest this finding. No significant murmur on exam. I asked patient to follow her symptoms cautiously on long-acting nitrate and call our office with update several days after starting. If she develops any dizziness this will need to be stopped. 5. HTN - uncontrolled, increase losartan to 50 mg po daily.  Disposition: F/u with Dr. Meda Coffee in 6 months. She also has f/u with pulmonology end of the month, needs attention to abnormal CXR findings from earlier this year.   Medication Adjustments/Labs and Tests Ordered: Current medicines are reviewed at length with the patient today.  Concerns regarding medicines are outlined above. Medication changes, Labs and Tests ordered today are summarized above and listed in the Patient Instructions accessible in Encounters.   Signed, Ena Dawley, MD  01/28/2017 11:49 PM

## 2017-02-01 ENCOUNTER — Other Ambulatory Visit: Payer: Self-pay

## 2017-02-01 NOTE — Patient Outreach (Signed)
[facility]) Care Management  [facility] Care [facility]  02/01/2017   [name] 1935/04/25 [id number]  Subjective: Telephone call to patient for monthly call.  Patient reports she feels better.  Asked patient about what she meant.  She states that she saw the cardiologist last week and she had fluid on her lungs.  She reports that she did not have any weight gain but the doctor told her that pulmonary was were her issue was.  She reports that her lasix and losartan was increased. She reports minimal weight loss as she weighs daily.  Discussed with patient symptoms and when to call the physician. She verbalized understanding.  Patient to have a follow up appointment next week but not sure she was going to keep it. Advised patient to keep her follow up as labs are probably going to be done to assess her kidney function with the increase in her lasix.  She verbalized understanding.   Objective:   Encounter Medications:  Outpatient Encounter Prescriptions as of 02/01/2017  Medication Sig Note  . albuterol (PROVENTIL HFA;VENTOLIN HFA) 108 (90 BASE) MCG/ACT inhaler Inhale 2 puffs into the lungs every 6 (six) hours as needed. For shortness of breath   . aspirin 81 MG tablet Take 81 mg by mouth daily.     Marland Kitchen. aspirin-acetaminophen-caffeine (EXCEDRIN MIGRAINE) 250-250-65 MG tablet Take 1 tablet by mouth every 6 (six) hours as needed for headache.   Marland Kitchen. atorvastatin (LIPITOR) 20 MG tablet Take 1 tablet (20 mg total) by mouth daily. 01/02/2017: Patient taking.  . budesonide-formoterol (SYMBICORT) 160-4.5 MCG/ACT inhaler Inhale 2 puffs into the lungs 2 (two) times daily.   Marland Kitchen. BYSTOLIC 5 MG tablet TAKE 1 TABLET (5 MG TOTAL) BY MOUTH DAILY.   . Calcium Carb-Cholecalciferol (CALCIUM 600 + D PO) Take 1 tablet by mouth daily.   . clonazePAM (KLONOPIN) 0.5 MG tablet Take 0.5 tablets (0.25 mg total) by mouth 2 (two) times daily.   . furosemide (LASIX) 40 MG tablet Take 1 tablet (40 mg total) by mouth 2  (two) times daily.   Marland Kitchen. losartan (COZAAR) 50 MG tablet Take 1 tablet (50 mg total) by mouth daily.   . Magnesium 500 MG CAPS Take 1 capsule by mouth daily.   . Multiple Vitamin (MULTIVITAMIN) tablet Take 1 tablet by mouth daily.     Marland Kitchen. SPIRIVA RESPIMAT 2.5 MCG/ACT AERS INHALE 2 PUFFS INTO THE LUNGS DAILY.   Marland Kitchen. UNABLE TO FIND Inhale 1.5-3 L into the lungs continuous.  09/20/2016: OXYGEN  . isosorbide mononitrate (IMDUR) 30 MG 24 hr tablet Take 0.5 tablets (15 mg total) by mouth daily. (Patient not taking: Reported on 01/26/2017) 01/02/2017: Not taking   No facility-administered encounter medications on file as of 02/01/2017.     Functional Status:  No flowsheet data found.  Fall/Depression Screening: Fall Risk  02/01/2017 01/02/2017 12/02/2016  Falls in the past year? No No No  Risk for fall due to : - - -  Risk for fall due to (comments): - - -   PHQ 2/9 Scores 02/01/2017 01/02/2017 12/02/2016 09/20/2016 08/22/2016 07/05/2016 05/12/2016  PHQ - 2 Score 0 0 0 0 0 0 0  PHQ- 9 Score - - - - - - -    Assessment: Patient continues to benefit from health coach outreach for disease management and support.    Plan:  [facility] Problem [facility]     Most Recent Value  Care Plan Problem One  COPD knowledge deficit  Role  Documenting the Problem One  Health Coach  Care Plan for Problem One  Active  [facility] Goal   Patient will understand COPD exacerbation symptoms and when to seek help within the next 90 days   [facility] Long Term Goal Start Date  02/01/17 [goal continued]  Interventions for Problem One Long Term Goal  RN Health Coach reviewed with patient COPD exacerbation symptoms and when to seek help.     RN Health Coach will contact patient in the month of August and patient agrees to next outreach.  Bary Lericheionne J Janari Gagner, RN, MSN [facility] Care Management RN Telephonic Health Coach [phone]

## 2017-02-08 ENCOUNTER — Encounter: Payer: Self-pay | Admitting: Pulmonary Disease

## 2017-02-08 ENCOUNTER — Ambulatory Visit (INDEPENDENT_AMBULATORY_CARE_PROVIDER_SITE_OTHER): Payer: PPO | Admitting: Pulmonary Disease

## 2017-02-08 ENCOUNTER — Ambulatory Visit (INDEPENDENT_AMBULATORY_CARE_PROVIDER_SITE_OTHER)
Admission: RE | Admit: 2017-02-08 | Discharge: 2017-02-08 | Disposition: A | Discharge status: Home / Self Care | Payer: PPO | Source: Ambulatory Visit | Attending: Pulmonary Disease | Admitting: Pulmonary Disease

## 2017-02-08 VITALS — BP 138/80 | HR 88 | Ht 61.0 in | Wt 125.8 lb

## 2017-02-08 DIAGNOSIS — R05 Cough: Secondary | ICD-10-CM | POA: Diagnosis not present

## 2017-02-08 DIAGNOSIS — J441 Chronic obstructive pulmonary disease with (acute) exacerbation: Secondary | ICD-10-CM | POA: Diagnosis not present

## 2017-02-08 DIAGNOSIS — J9611 Chronic respiratory failure with hypoxia: Secondary | ICD-10-CM | POA: Diagnosis not present

## 2017-02-08 MED ORDER — PREDNISONE 10 MG PO TABS: ORAL_TABLET | ORAL | 0 refills | Status: DC

## 2017-02-08 NOTE — Patient Instructions (Signed)
Chest xray today  Prednisone 10 mg pill >> 3 pills daily for 2 days, 2 pills daily for 2 days, 1 pill daily for 2 days  Call if not feeling better after finishing prednisone  Follow up with Dr. Delton CoombesByrum in September 2018

## 2017-02-08 NOTE — Progress Notes (Signed)
Current Outpatient Prescriptions on File Prior to Visit  Medication Sig  . albuterol (PROVENTIL HFA;VENTOLIN HFA) 108 (90 BASE) MCG/ACT inhaler Inhale 2 puffs into the lungs every 6 (six) hours as needed. For shortness of breath  . aspirin 81 MG tablet Take 81 mg by mouth daily.    Marland Kitchen. aspirin-acetaminophen-caffeine (EXCEDRIN MIGRAINE) 250-250-65 MG tablet Take 1 tablet by mouth every 6 (six) hours as needed for headache.  Marland Kitchen. atorvastatin (LIPITOR) 20 MG tablet Take 1 tablet (20 mg total) by mouth daily.  . budesonide-formoterol (SYMBICORT) 160-4.5 MCG/ACT inhaler Inhale 2 puffs into the lungs 2 (two) times daily.  Marland Kitchen. BYSTOLIC 5 MG tablet TAKE 1 TABLET (5 MG TOTAL) BY MOUTH DAILY.  . Calcium Carb-Cholecalciferol (CALCIUM 600 + D PO) Take 1 tablet by mouth daily.  . clonazePAM (KLONOPIN) 0.5 MG tablet Take 0.5 tablets (0.25 mg total) by mouth 2 (two) times daily.  . furosemide (LASIX) 40 MG tablet Take 1 tablet (40 mg total) by mouth 2 (two) times daily.  Marland Kitchen. losartan (COZAAR) 50 MG tablet Take 1 tablet (50 mg total) by mouth daily.  . Magnesium 500 MG CAPS Take 1 capsule by mouth daily.  . Multiple Vitamin (MULTIVITAMIN) tablet Take 1 tablet by mouth daily.    Marland Kitchen. SPIRIVA RESPIMAT 2.5 MCG/ACT AERS INHALE 2 PUFFS INTO THE LUNGS DAILY.  Marland Kitchen. UNABLE TO FIND Inhale 1.5-3 L into the lungs continuous.    No current facility-administered medications on file prior to visit.      Chief Complaint  Patient presents with  . Acute Visit    Pt c/o increased SOB x 1 day -- started at 3am this morning and worsening since. Pt states that she has been coughing and coughing up mucus x 2 days - thick. Pt very emotional today. Denies feeling any congestion in lungs, pressure or tightness. Pt has a history of having fluid on her lungs back in July.     Past medical history, Past surgical history, Family history, Social history, Allergies all reviewed.  Vital Signs BP 138/80 (BP Location: Right Arm, Cuff Size: Normal)    Pulse 88   Ht 5\' 1"  (1.549 m)   Wt 125 lb 12.8 oz (57.1 kg)   SpO2 95%   BMI 23.77 kg/m   History of Present Illness [name] is a 82 y.o. female with COPD and chronic hypoxic respiratory failure.  She is followed by Dr. Delton CoombesByrum.  She is here for an acute visit.  She was seen in cardiology clinic two weeks ago.  There was concern for fluid in her lungs.  She had lasix and losartan increased.  Her symptoms improved.  Over the past few days she has noticed more cough with chest congestion.  She feels like it is harder to get air into her lungs.  Her albuterol helps some.  She feels she can take a deep enough breath to get her inhalers into her system.  She tried nebulizer before, but this made her heart race.  She is not having sinus congestion, sore throat, chest pain, or fever.  She hasn't noticed any color to her sputum, and denies hemoptysis.  She gets ankle swelling during the day, but this gets better at night when she puts her legs up.  She is using 3 liters pulsed oxygen 24/7.  Her pulse ox readings at home are above 90%.   Physical Exam  General - sitting in wheelchair, wearing oxygen Eyes - pupils reactive, wearing glasses ENT - No  sinus tenderness, no oral exudate, no LAN, wears dentures Cardiac - s1s2 regular, no murmur Chest - poor air movement, prolonged exhalation, no wheeze or rales Back - No focal tenderness Abd - Soft, non-tender Ext - mild ankle edema Neuro - Normal strength Skin - No rashes Psych - normal mood, and behavior   CMP Latest Ref Rng & Units 01/26/2017 09/19/2016 07/29/2016  Glucose 65 - 99 mg/dL 244(W111(H) 102(V114(H) 98  BUN 8 - 27 mg/dL 19 16 25   Creatinine 0.57 - 1.00 mg/dL 2.530.85 6.640.89 4.030.74  Sodium 134 - 144 mmol/L 140 136 141  Potassium 3.5 - 5.2 mmol/L 5.0 4.3 4.4  Chloride 96 - 106 mmol/L 95(L) 96(L) 95(L)  CO2 20 - 29 mmol/L 28 29 32(H)  Calcium 8.7 - 10.3 mg/dL 10.8(H) 9.5 9.8  Total Protein 6.5 - 8.1 g/dL - 6.7 -  Total Bilirubin 0.3 - 1.2  mg/dL - 0.7 -  Alkaline Phos 38 - 126 U/L - 49 -  AST 15 - 41 U/L - 17 -  ALT 14 - 54 U/L - 16 -    CBC Latest Ref Rng & Units 09/19/2016 07/15/2016 07/15/2016  WBC 4.0 - 10.5 K/uL 10.8(H) 7.0 WILL FOLLOW  Hemoglobin 12.0 - 15.0 g/dL 11.9(L) 11.9 WILL FOLLOW  Hematocrit 36.0 - 46.0 % 36.5 36.7 WILL FOLLOW  Platelets 150 - 400 K/uL 309 325 WILL FOLLOW    ABG    Component Value Date/Time   PHART 7.329 (L) 09/09/2015 0019   PCO2ART 56.7 (H) 09/09/2015 0019   PO2ART 217.0 (H) 09/09/2015 0019   HCO3 29.9 (H) 09/09/2015 0019   TCO2 32 09/09/2015 0019   ACIDBASEDEF 1.0 04/16/2012 1655   O2SAT 100.0 09/09/2015 0019    PFT 08/05/10 >> FEV1 0.60 (36%), FEV1% 32, TLC 5.20 (124%), DLCO 50% Echo 09/15/15 >> EF 65 to 70%, grade 1 DD CT chest 12/06/16 >> 6 mm nodule RUL stable, centrilobular emphysema   Assessment/Plan  COPD exacerbation. - will give course of prednisone - defer Abx for now - will get chest xray and call with results  GOLD 4 D COPD with emphysema. - she can d/w Dr. Delton CoombesByrum about whether changing to nebulized LAMA, LABA, and ICS might work better for her - continue spiriva, symbicort, prn albuterol for now - she might also benefit from palliative care assessment  Chronic respiratory failure with hypoxia, hypercapnia. - continue 3 liters pulses oxygen 24/7 - she might benefit from assessment for home NIPPV set up  Chronic diastolic CHF. - she has f/u with Cardiology later this week   Patient Instructions  Chest xray today  Prednisone 10 mg pill >> 3 pills daily for 2 days, 2 pills daily for 2 days, 1 pill daily for 2 days  Call if not feeling better after finishing prednisone  Follow up with Dr. Delton CoombesByrum in September 2018    Coralyn HellingVineet Tedrick Port, MD [facility] [facility] Pager:  [phone] 02/08/2017, 11:21 AM

## 2017-02-09 ENCOUNTER — Telehealth: Payer: Self-pay | Admitting: Pulmonary Disease

## 2017-02-09 NOTE — Telephone Encounter (Signed)
Dg Chest 2 View  Result Date: 02/08/2017 CLINICAL DATA:  Dyspnea, cough. EXAM: CHEST  2 VIEW COMPARISON:  Radiographs of September 19, 2016. FINDINGS: The heart size and mediastinal contours are within normal limits. Atherosclerosis of thoracic aorta is noted. No pneumothorax or pleural effusion is noted. Both lungs are clear. The visualized skeletal structures are unremarkable. IMPRESSION: No active cardiopulmonary disease.  Aortic atherosclerosis. Electronically Signed   By: Lupita RaiderJames  Green Jr, M.D.   On: 02/08/2017 13:17     Please let her know her CXR showed expected changes from COPD.  No other worrisome findings.

## 2017-02-10 ENCOUNTER — Encounter: Payer: Self-pay | Admitting: Cardiology

## 2017-02-10 ENCOUNTER — Ambulatory Visit (INDEPENDENT_AMBULATORY_CARE_PROVIDER_SITE_OTHER): Payer: PPO | Admitting: Cardiology

## 2017-02-10 DIAGNOSIS — R0602 Shortness of breath: Secondary | ICD-10-CM

## 2017-02-10 DIAGNOSIS — I5031 Acute diastolic (congestive) heart failure: Secondary | ICD-10-CM | POA: Diagnosis not present

## 2017-02-10 DIAGNOSIS — I2510 Atherosclerotic heart disease of native coronary artery without angina pectoris: Secondary | ICD-10-CM | POA: Diagnosis not present

## 2017-02-10 DIAGNOSIS — J449 Chronic obstructive pulmonary disease, unspecified: Secondary | ICD-10-CM

## 2017-02-10 LAB — BASIC METABOLIC PANEL
BUN/Creatinine Ratio: 26 (ref 12–28)
BUN: 22 mg/dL (ref 8–27)
CO2: 29 mmol/L (ref 20–29)
Calcium: 9.8 mg/dL (ref 8.7–10.3)
Chloride: 98 mmol/L (ref 96–106)
Creatinine, Ser: 0.84 mg/dL (ref 0.57–1.00)
GFR calc Af Amer: 76 mL/min/{1.73_m2} (ref >59)
GFR calc non Af Amer: 66 mL/min/{1.73_m2} (ref >59)
Glucose: 88 mg/dL (ref 65–99)
Potassium: 5.2 mmol/L (ref 3.5–5.2)
Sodium: 143 mmol/L (ref 134–144)

## 2017-02-10 NOTE — Patient Instructions (Addendum)
Medication Instructions:  Your physician recommends that you continue on your current medications as directed. Please refer to the Current Medication list given to you today. If you need a refill on your cardiac medications before your next appointment, please call your pharmacy.  Labwork: BMP TODAY HERE IN OUR OFFICE AT [facility]  Follow-Up: Your physician wants you to follow-up in: 5 MONTHS WITH DR Johnell ComingsNELSON You should receive a reminder letter in the mail two months in advance. If you do not receive a letter, please call our [address] [phone]) in [place] to schedule the DECEMBER follow-up appointment.  [facility]!!

## 2017-02-10 NOTE — Assessment & Plan Note (Signed)
This is predominantly secondary to COPD. No significant change in wgt with additional Lasix

## 2017-02-10 NOTE — Telephone Encounter (Signed)
Results have been explained to patient, pt expressed understanding. Nothing further needed.  

## 2017-02-10 NOTE — Assessment & Plan Note (Signed)
Coronary Ca++ seen on CT. Myoview low risk

## 2017-02-10 NOTE — Assessment & Plan Note (Signed)
Followed by [facility] pulmonary Steroid dose pack added recently with some improvement

## 2017-02-10 NOTE — Assessment & Plan Note (Signed)
July 2018- Lasix increased- BNP was 91 Echo march 2017 showed "vigorous" LVF with an EF of 65-70% with mild LVF ang grade 1 DD

## 2017-02-10 NOTE — Progress Notes (Addendum)
02/10/2017 [name]   Jan 06, 1935  [id number]  Primary Physician Velna Hatchet, MD Primary Cardiologist: Dr Meda Coffee  HPI:  82 y/o female with COPD on chronic O2. She saw Dr Meda Coffee 01/26/17 with complaints of increased dyspnea. The pt had a history of prior echo suggesting HCM. Dr Meda Coffee suspected her dyspnea was predominantly due to COPD. The pt's echo in 09/2015 did not show any significant hypertrophic cardiomyopathy or obstruction. Dr Meda Coffee did feel she was slightly fluid overloaded then and increased her Lasix from 20 mg BID to 40 mg po BID. She was placed on my schedule today as the [facility] APP schedule was full.  The pt was seen by Dr Halford Chessman 02/08/17. She was placed on a steroid dose pack. She thinks she has noticed some improvement. Her wgt is unchanged. She still has mild dependent LE edema.   Current Outpatient Prescriptions  Medication Sig Dispense Refill  . albuterol (PROVENTIL HFA;VENTOLIN HFA) 108 (90 BASE) MCG/ACT inhaler Inhale 2 puffs into the lungs every 6 (six) hours as needed. For shortness of breath    . aspirin 81 MG tablet Take 81 mg by mouth daily.      Marland Kitchen aspirin-acetaminophen-caffeine (EXCEDRIN MIGRAINE) 250-250-65 MG tablet Take 1 tablet by mouth every 6 (six) hours as needed for headache.    Marland Kitchen atorvastatin (LIPITOR) 20 MG tablet Take 1 tablet (20 mg total) by mouth daily. 90 tablet 3  . budesonide-formoterol (SYMBICORT) 160-4.5 MCG/ACT inhaler Inhale 2 puffs into the lungs 2 (two) times daily. 2 Inhaler 0  . BYSTOLIC 5 MG tablet TAKE 1 TABLET (5 MG TOTAL) BY MOUTH DAILY. 90 tablet 1  . Calcium Carb-Cholecalciferol (CALCIUM 600 + D PO) Take 1 tablet by mouth daily.    . clonazePAM (KLONOPIN) 0.5 MG tablet Take 0.5 tablets (0.25 mg total) by mouth 2 (two) times daily. 60 tablet 0  . furosemide (LASIX) 40 MG tablet Take 1 tablet (40 mg total) by mouth 2 (two) times daily. 90 tablet 1  . losartan (COZAAR) 50 MG tablet Take 1 tablet (50 mg total) by mouth daily. 90  tablet 2  . Magnesium 500 MG CAPS Take 1 capsule by mouth daily.    . Multiple Vitamin (MULTIVITAMIN) tablet Take 1 tablet by mouth daily.      . predniSONE (DELTASONE) 10 MG tablet 3 pills daily for 2 days, 2 pills daily for 2 days, 1 pill daily for 2 days 12 tablet 0  . SPIRIVA RESPIMAT 2.5 MCG/ACT AERS INHALE 2 PUFFS INTO THE LUNGS DAILY. 12 g 3  . UNABLE TO FIND Inhale 1.5-3 L into the lungs continuous.      No current facility-administered medications for this visit.     Allergies  Allergen Reactions  . Penicillins Hives    Has taken it since.  . Pneumococcal Vaccine Swelling    Severe localized swelling in arm  . Pneumovax [Pneumococcal Polysaccharide Vaccine] Hives    Past Medical History:  Diagnosis Date  . Actinomycosis, cervicofacial   . CAD (coronary artery disease)    a. coronary calcification on prior Chest CT;  b. Dob MV 1/14:  EF 85%, no ischemia  . COPD (chronic obstructive pulmonary disease) ([place])   . Emphysema   . HTN (hypertension)   . Hx of echocardiogram    Echocardiogram 04/16/12: Mild focal basal septal hypertrophy, EF 70-75% with dynamic obstruction with mid cavity obliteration, grade 1 diastolic dysfunction, MAC, atrial septal thickening with findings consistent with lipomatous hypertrophy  .  Hyperlipidemia   . PVD (peripheral vascular disease) ([place])     Social History   Social History  . Marital status: Married    Spouse name: N/A  . Number of children: 4  . Years of education: N/A   Occupational History  . retired Retired    Air cabin crew   Social History Main Topics  . Smoking status: Former Smoker    Packs/day: 1.00    Years: 49.00    Types: Cigarettes    Quit date: 07/11/2005  . Smokeless tobacco: Never Used     Comment: smoked for 48 years 1 ppd  . Alcohol use No  . Drug use: No  . Sexual activity: Not on file   Other Topics Concern  . Not on file   Social History Narrative  . No narrative on file     Family History  Problem  Relation Age of Onset  . Heart disease Mother   . Emphysema Father   . Heart disease Father   . Stomach cancer Father   . Coronary artery disease Unknown        family hx of female 1st. degree relative <50/family hx of 1st. degree relative <60  . Emphysema Sister        SMOKER  . Stomach cancer Sister   . Emphysema Sister        SMOKER  . Coronary artery disease Brother        SMOKER  . Emphysema Sister      Review of Systems: General: negative for chills, fever, night sweats or weight changes.  Cardiovascular: negative for chest pain, orthopnea, palpitations, paroxysmal nocturnal dyspnea or shortness of breath Dermatological: negative for rash Respiratory: negative for cough or wheezing Urologic: negative for hematuria Abdominal: negative for nausea, vomiting, diarrhea, bright red blood per rectum, melena, or hematemesis Neurologic: negative for visual changes, syncope, or dizziness All other systems reviewed and are otherwise negative except as noted above.    Blood pressure 128/80, pulse 72, height _0  (1.549 m), weight 127 lb 3.2 oz (57.7 kg).  General appearance: alert, cooperative, no distress and in wheel chair, on O2 Lungs: decreased breath sounds c/w COPD Heart: regular rate and rhythm Extremities: trace edema Skin: Skin color, texture, turgor normal. No rashes or lesions Neurologic: Grossly normal   ASSESSMENT AND PLAN:   Shortness of breath This is predominantly secondary to COPD. No significant change in wgt with additional Lasix  Acute diastolic heart failure [facility]) July 2018- Lasix increased- BNP was 91 Echo march 2017 showed "vigorous" LVF with an EF of 65-70% with mild LVF ang grade 1 DD  COPD (chronic obstructive pulmonary disease) (HCC) Followed by [place] [facility] Steroid dose pack added recently with some improvement  CAD (coronary artery disease), native coronary artery Coronary Ca++ seen on CT. Myoview low risk   PLAN  Check BMP as  suggested by Dr Meda Coffee, though the recent addition of a steroid a dose pack may have elevated her BUN. As long as her SCr, B/P, and wgt remains stable will keep her on Lasix 40 mg BID to attempt to keep her volume status as dry as is safe for her. .She has a f/u in Sept and should have another BMP then.   Kerin Ransom PA-C 02/10/2017 8:18 AM

## 2017-02-14 ENCOUNTER — Telehealth: Payer: Self-pay | Admitting: *Deleted

## 2017-02-14 DIAGNOSIS — Z79899 Other long term (current) drug therapy: Secondary | ICD-10-CM

## 2017-02-14 NOTE — Telephone Encounter (Signed)
Results and recommendations discussed with patient, who verbalized understanding and thanks. BMET ordered and put in lab accordion file.

## 2017-02-14 NOTE — Telephone Encounter (Signed)
-----   Message from Abelino DerrickLuke K Kilroy, [place]PA-C sent at 02/13/2017  4:52 PM EDT ----- Please let the pt know her labs looked OK. Avoid high potasium foods. Check BMP in one month.  Corine ShelterLUKE KILROY PA-C 02/13/2017 4:52 PM

## 2017-02-27 ENCOUNTER — Other Ambulatory Visit: Payer: Self-pay

## 2017-02-27 NOTE — Patient Outreach (Signed)
[facility]) Care Management  [facility] Care Manager  02/27/2017   [name] 10/30/1934 [id number]  Subjective: Telephone call to patient for monthly call. Patient reports she is doing good today.  Patient reports that her potassium was up on her last visit but no changes made.  Patient to follow up next month for more blood work.  She reports some days are good and others are not so good.  Discussed with patient COPD symptoms and when to notify her physician.  She verbalized understanding. Discussed with patient case closure next month as she has been able to maintain her COPD and meet her goals.  She verbalized understanding and is in agreement.   Objective:   Encounter Medications:  Outpatient Encounter Prescriptions as of 02/27/2017  Medication Sig Note  . albuterol (PROVENTIL HFA;VENTOLIN HFA) 108 (90 BASE) MCG/ACT inhaler Inhale 2 puffs into the lungs every 6 (six) hours as needed. For shortness of breath   . aspirin 81 MG tablet Take 81 mg by mouth daily.     Marland Kitchen aspirin-acetaminophen-caffeine (EXCEDRIN MIGRAINE) 250-250-65 MG tablet Take 1 tablet by mouth every 6 (six) hours as needed for headache.   Marland Kitchen atorvastatin (LIPITOR) 20 MG tablet Take 1 tablet (20 mg total) by mouth daily.   . budesonide-formoterol (SYMBICORT) 160-4.5 MCG/ACT inhaler Inhale 2 puffs into the lungs 2 (two) times daily.   Marland Kitchen BYSTOLIC 5 MG tablet TAKE 1 TABLET (5 MG TOTAL) BY MOUTH DAILY.   . Calcium Carb-Cholecalciferol (CALCIUM 600 + D PO) Take 1 tablet by mouth daily.   . clonazePAM (KLONOPIN) 0.5 MG tablet Take 0.5 tablets (0.25 mg total) by mouth 2 (two) times daily.   . furosemide (LASIX) 40 MG tablet Take 1 tablet (40 mg total) by mouth 2 (two) times daily.   Marland Kitchen losartan (COZAAR) 50 MG tablet Take 1 tablet (50 mg total) by mouth daily.   . Magnesium 500 MG CAPS Take 1 capsule by mouth daily.   . Multiple Vitamin (MULTIVITAMIN) tablet Take 1 tablet by mouth daily.     Marland Kitchen SPIRIVA RESPIMAT 2.5  MCG/ACT AERS INHALE 2 PUFFS INTO THE LUNGS DAILY.   Marland Kitchen UNABLE TO FIND Inhale 1.5-3 L into the lungs continuous.  09/20/2016: OXYGEN  . predniSONE (DELTASONE) 10 MG tablet 3 pills daily for 2 days, 2 pills daily for 2 days, 1 pill daily for 2 days (Patient not taking: Reported on 02/27/2017)    No facility-administered encounter medications on file as of 02/27/2017.     Functional Status:  No flowsheet data found.  Fall/Depression Screening: Fall Risk  02/01/2017 01/02/2017 12/02/2016  Falls in the past year? No No No  Risk for fall due to : - - -  Risk for fall due to: Comment - - -   PHQ 2/9 Scores 02/27/2017 02/01/2017 01/02/2017 12/02/2016 09/20/2016 08/22/2016 07/05/2016  PHQ - 2 Score 0 0 0 0 0 0 0  PHQ- 9 Score - - - - - - -    Assessment: Patient continues to benefit from health coach outreach for disease management and support.    Plan:  [facility] CM Care Plan Problem One     Most Recent Value  Care Plan Problem One  COPD knowledge deficit  Role Documenting the Problem One  Health Coach  Care Plan for Problem One  Active  [facility] Long Term Goal   Patient will understand COPD exacerbation symptoms and when to seek help within the next 90 days   [facility] Long  Term Goal Start Date  02/27/17 [goal continued]  Interventions for Problem One Long Term Goal  RN Health Coach reiterated with patient COPD exacerbation symptoms and when to seek help.     RN Health Coach will contact patient in the month of September and patient agrees to next outreach.  Bary Leriche, RN, MSN [facility] Care Management RN Telephonic Health Coach [phone]

## 2017-03-07 ENCOUNTER — Encounter (HOSPITAL_COMMUNITY): Payer: Self-pay | Admitting: Emergency Medicine

## 2017-03-07 ENCOUNTER — Emergency Department (HOSPITAL_COMMUNITY): Payer: PPO

## 2017-03-07 ENCOUNTER — Inpatient Hospital Stay (HOSPITAL_COMMUNITY)
Admission: EM | Admit: 2017-03-07 | Discharge: 2017-03-14 | DRG: 190 | Disposition: A | Discharge status: Home / Self Care | Payer: PPO | Attending: Internal Medicine | Admitting: Internal Medicine

## 2017-03-07 ENCOUNTER — Other Ambulatory Visit: Payer: Self-pay

## 2017-03-07 DIAGNOSIS — J9622 Acute and chronic respiratory failure with hypercapnia: Secondary | ICD-10-CM | POA: Diagnosis present

## 2017-03-07 DIAGNOSIS — X58XXXA Exposure to other specified factors, initial encounter: Secondary | ICD-10-CM | POA: Diagnosis present

## 2017-03-07 DIAGNOSIS — M7989 Other specified soft tissue disorders: Secondary | ICD-10-CM | POA: Diagnosis not present

## 2017-03-07 DIAGNOSIS — R069 Unspecified abnormalities of breathing: Secondary | ICD-10-CM | POA: Diagnosis not present

## 2017-03-07 DIAGNOSIS — J439 Emphysema, unspecified: Secondary | ICD-10-CM | POA: Diagnosis not present

## 2017-03-07 DIAGNOSIS — T380X5A Adverse effect of glucocorticoids and synthetic analogues, initial encounter: Secondary | ICD-10-CM | POA: Diagnosis present

## 2017-03-07 DIAGNOSIS — I2510 Atherosclerotic heart disease of native coronary artery without angina pectoris: Secondary | ICD-10-CM | POA: Diagnosis present

## 2017-03-07 DIAGNOSIS — D509 Iron deficiency anemia, unspecified: Secondary | ICD-10-CM | POA: Diagnosis present

## 2017-03-07 DIAGNOSIS — J9621 Acute and chronic respiratory failure with hypoxia: Secondary | ICD-10-CM

## 2017-03-07 DIAGNOSIS — I110 Hypertensive heart disease with heart failure: Secondary | ICD-10-CM | POA: Diagnosis present

## 2017-03-07 DIAGNOSIS — Z881 Allergy status to other antibiotic agents status: Secondary | ICD-10-CM | POA: Diagnosis not present

## 2017-03-07 DIAGNOSIS — T148XXA Other injury of unspecified body region, initial encounter: Secondary | ICD-10-CM | POA: Diagnosis not present

## 2017-03-07 DIAGNOSIS — R000 Tachycardia, unspecified: Secondary | ICD-10-CM | POA: Diagnosis present

## 2017-03-07 DIAGNOSIS — N179 Acute kidney failure, unspecified: Secondary | ICD-10-CM | POA: Diagnosis not present

## 2017-03-07 DIAGNOSIS — I491 Atrial premature depolarization: Secondary | ICD-10-CM | POA: Diagnosis present

## 2017-03-07 DIAGNOSIS — F419 Anxiety disorder, unspecified: Secondary | ICD-10-CM | POA: Diagnosis present

## 2017-03-07 DIAGNOSIS — S40021A Contusion of right upper arm, initial encounter: Secondary | ICD-10-CM | POA: Diagnosis present

## 2017-03-07 DIAGNOSIS — Z7982 Long term (current) use of aspirin: Secondary | ICD-10-CM

## 2017-03-07 DIAGNOSIS — D72829 Elevated white blood cell count, unspecified: Secondary | ICD-10-CM | POA: Diagnosis not present

## 2017-03-07 DIAGNOSIS — J9611 Chronic respiratory failure with hypoxia: Secondary | ICD-10-CM | POA: Diagnosis present

## 2017-03-07 DIAGNOSIS — E785 Hyperlipidemia, unspecified: Secondary | ICD-10-CM | POA: Diagnosis not present

## 2017-03-07 DIAGNOSIS — Z7189 Other specified counseling: Secondary | ICD-10-CM | POA: Diagnosis not present

## 2017-03-07 DIAGNOSIS — I422 Other hypertrophic cardiomyopathy: Secondary | ICD-10-CM

## 2017-03-07 DIAGNOSIS — I739 Peripheral vascular disease, unspecified: Secondary | ICD-10-CM | POA: Diagnosis present

## 2017-03-07 DIAGNOSIS — Z887 Allergy status to serum and vaccine status: Secondary | ICD-10-CM

## 2017-03-07 DIAGNOSIS — K5900 Constipation, unspecified: Secondary | ICD-10-CM | POA: Diagnosis not present

## 2017-03-07 DIAGNOSIS — J449 Chronic obstructive pulmonary disease, unspecified: Secondary | ICD-10-CM

## 2017-03-07 DIAGNOSIS — J441 Chronic obstructive pulmonary disease with (acute) exacerbation: Secondary | ICD-10-CM | POA: Diagnosis present

## 2017-03-07 DIAGNOSIS — I700 Atherosclerosis of aorta: Secondary | ICD-10-CM | POA: Diagnosis present

## 2017-03-07 DIAGNOSIS — Z87891 Personal history of nicotine dependence: Secondary | ICD-10-CM | POA: Diagnosis not present

## 2017-03-07 DIAGNOSIS — Z79899 Other long term (current) drug therapy: Secondary | ICD-10-CM

## 2017-03-07 DIAGNOSIS — I519 Heart disease, unspecified: Secondary | ICD-10-CM | POA: Diagnosis not present

## 2017-03-07 DIAGNOSIS — S40022A Contusion of left upper arm, initial encounter: Secondary | ICD-10-CM | POA: Diagnosis present

## 2017-03-07 DIAGNOSIS — Z800 Family history of malignant neoplasm of digestive organs: Secondary | ICD-10-CM

## 2017-03-07 DIAGNOSIS — Z880 Allergy status to penicillin: Secondary | ICD-10-CM

## 2017-03-07 DIAGNOSIS — Z515 Encounter for palliative care: Secondary | ICD-10-CM

## 2017-03-07 DIAGNOSIS — J9612 Chronic respiratory failure with hypercapnia: Secondary | ICD-10-CM | POA: Diagnosis not present

## 2017-03-07 DIAGNOSIS — R739 Hyperglycemia, unspecified: Secondary | ICD-10-CM | POA: Diagnosis not present

## 2017-03-07 DIAGNOSIS — Z7951 Long term (current) use of inhaled steroids: Secondary | ICD-10-CM

## 2017-03-07 DIAGNOSIS — Z9981 Dependence on supplemental oxygen: Secondary | ICD-10-CM

## 2017-03-07 DIAGNOSIS — I5189 Other ill-defined heart diseases: Secondary | ICD-10-CM | POA: Diagnosis present

## 2017-03-07 DIAGNOSIS — I340 Nonrheumatic mitral (valve) insufficiency: Secondary | ICD-10-CM | POA: Diagnosis not present

## 2017-03-07 DIAGNOSIS — Z825 Family history of asthma and other chronic lower respiratory diseases: Secondary | ICD-10-CM

## 2017-03-07 DIAGNOSIS — R0602 Shortness of breath: Secondary | ICD-10-CM

## 2017-03-07 DIAGNOSIS — I5030 Unspecified diastolic (congestive) heart failure: Secondary | ICD-10-CM | POA: Diagnosis not present

## 2017-03-07 DIAGNOSIS — I10 Essential (primary) hypertension: Secondary | ICD-10-CM | POA: Diagnosis not present

## 2017-03-07 DIAGNOSIS — Z8249 Family history of ischemic heart disease and other diseases of the circulatory system: Secondary | ICD-10-CM

## 2017-03-07 DIAGNOSIS — M810 Age-related osteoporosis without current pathological fracture: Secondary | ICD-10-CM | POA: Diagnosis present

## 2017-03-07 DIAGNOSIS — Z66 Do not resuscitate: Secondary | ICD-10-CM | POA: Diagnosis not present

## 2017-03-07 DIAGNOSIS — R0902 Hypoxemia: Secondary | ICD-10-CM | POA: Diagnosis not present

## 2017-03-07 LAB — BASIC METABOLIC PANEL
ANION GAP: 13 (ref 5–15)
BUN: 32 mg/dL — ABNORMAL HIGH (ref 6–20)
CO2: 27 mmol/L (ref 22–32)
Calcium: 9.2 mg/dL (ref 8.9–10.3)
Chloride: 99 mmol/L — ABNORMAL LOW (ref 101–111)
Creatinine, Ser: 1.26 mg/dL — ABNORMAL HIGH (ref 0.44–1.00)
GFR calc Af Amer: 46 mL/min — ABNORMAL LOW (ref >60)
GFR calc non Af Amer: 39 mL/min — ABNORMAL LOW (ref >60)
GLUCOSE: 187 mg/dL — AB (ref 65–99)
POTASSIUM: 4.2 mmol/L (ref 3.5–5.1)
Sodium: 139 mmol/L (ref 135–145)

## 2017-03-07 LAB — I-STAT ARTERIAL BLOOD GAS, ED
ACID-BASE EXCESS: 4 mmol/L — AB (ref 0.0–2.0)
Acid-Base Excess: 5 mmol/L — ABNORMAL HIGH (ref 0.0–2.0)
Bicarbonate: 31.6 mmol/L — ABNORMAL HIGH (ref 20.0–28.0)
Bicarbonate: 29.5 mmol/L — ABNORMAL HIGH (ref 20.0–28.0)
O2 Saturation: 99 %
O2 Saturation: 98 %
PCO2 ART: 52.7 mmHg — AB (ref 32.0–48.0)
PH ART: 7.383 (ref 7.350–7.450)
PH ART: 7.4 (ref 7.350–7.450)
PO2 ART: 167 mmHg — AB (ref 83.0–108.0)
Patient temperature: 97.6
TCO2: 33 mmol/L — ABNORMAL HIGH (ref 22–32)
TCO2: 31 mmol/L (ref 22–32)
pCO2 arterial: 47.6 mmHg (ref 32.0–48.0)
pO2, Arterial: 112 mmHg — ABNORMAL HIGH (ref 83.0–108.0)

## 2017-03-07 LAB — CBC WITH DIFFERENTIAL/PLATELET
BASOS ABS: 0.1 10*3/uL (ref 0.0–0.1)
Basophils Relative: 1 %
Eosinophils Absolute: 0.3 10*3/uL (ref 0.0–0.7)
Eosinophils Relative: 3 %
HEMATOCRIT: 35 % — AB (ref 36.0–46.0)
Hemoglobin: 11.4 g/dL — ABNORMAL LOW (ref 12.0–15.0)
LYMPHS PCT: 34 %
Lymphs Abs: 3.5 10*3/uL (ref 0.7–4.0)
MCH: 30.7 pg (ref 26.0–34.0)
MCHC: 32.6 g/dL (ref 30.0–36.0)
MCV: 94.3 fL (ref 78.0–100.0)
MONO ABS: 0.9 10*3/uL (ref 0.1–1.0)
Monocytes Relative: 9 %
NEUTROS ABS: 5.4 10*3/uL (ref 1.7–7.7)
Neutrophils Relative %: 53 %
Platelets: 375 10*3/uL (ref 150–400)
RBC: 3.71 MIL/uL — ABNORMAL LOW (ref 3.87–5.11)
RDW: 12.6 % (ref 11.5–15.5)
WBC: 10.2 10*3/uL (ref 4.0–10.5)

## 2017-03-07 LAB — I-STAT VENOUS BLOOD GAS, ED
ACID-BASE EXCESS: 10 mmol/L — AB (ref 0.0–2.0)
Bicarbonate: 34.5 mmol/L — ABNORMAL HIGH (ref 20.0–28.0)
O2 Saturation: 99 %
PH VEN: 7.497 — AB (ref 7.250–7.430)
TCO2: 36 mmol/L — ABNORMAL HIGH (ref 22–32)
pCO2, Ven: 44.5 mmHg (ref 44.0–60.0)
pO2, Ven: 128 mmHg — ABNORMAL HIGH (ref 32.0–45.0)

## 2017-03-07 LAB — CBG MONITORING, ED
GLUCOSE-CAPILLARY: 221 mg/dL — AB (ref 65–99)
GLUCOSE-CAPILLARY: 225 mg/dL — AB (ref 65–99)
Glucose-Capillary: 133 mg/dL — ABNORMAL HIGH (ref 65–99)

## 2017-03-07 LAB — BRAIN NATRIURETIC PEPTIDE: B Natriuretic Peptide: 61.5 pg/mL (ref 0.0–100.0)

## 2017-03-07 LAB — HEMOGLOBIN A1C
Hgb A1c MFr Bld: 5.6 % (ref 4.8–5.6)
MEAN PLASMA GLUCOSE: 114.02 mg/dL

## 2017-03-07 LAB — GLUCOSE, CAPILLARY: Glucose-Capillary: 134 mg/dL — ABNORMAL HIGH (ref 65–99)

## 2017-03-07 LAB — TROPONIN I: Troponin I: <0.03 ng/mL (ref <0.03)

## 2017-03-07 MED ORDER — ATORVASTATIN CALCIUM 20 MG PO TABS
20.0000 mg | ORAL_TABLET | Freq: Every day | ORAL | Status: DC
  Administered 2017-03-07 – 2017-03-14 (×8): 20 mg via ORAL
  Filled 2017-03-07 (×8): qty 1

## 2017-03-07 MED ORDER — ALBUTEROL (5 MG/ML) CONTINUOUS INHALATION SOLN
15.0000 mg/h | INHALATION_SOLUTION | Freq: Once | RESPIRATORY_TRACT | Status: AC
  Administered 2017-03-07: 15 mg/h via RESPIRATORY_TRACT
  Filled 2017-03-07: qty 20

## 2017-03-07 MED ORDER — MOMETASONE FURO-FORMOTEROL FUM 200-5 MCG/ACT IN AERO
2.0000 | INHALATION_SPRAY | Freq: Two times a day (BID) | RESPIRATORY_TRACT | Status: DC
  Administered 2017-03-07 – 2017-03-14 (×14): 2 via RESPIRATORY_TRACT
  Filled 2017-03-07: qty 8.8

## 2017-03-07 MED ORDER — CALCIUM CARBONATE-VITAMIN D 500-200 MG-UNIT PO TABS
1.0000 | ORAL_TABLET | Freq: Every day | ORAL | Status: DC
  Administered 2017-03-08 – 2017-03-14 (×7): 1 via ORAL
  Filled 2017-03-07 (×7): qty 1

## 2017-03-07 MED ORDER — METHYLPREDNISOLONE SODIUM SUCC 125 MG IJ SOLR
60.0000 mg | Freq: Four times a day (QID) | INTRAMUSCULAR | Status: DC
  Administered 2017-03-07 – 2017-03-10 (×13): 60 mg via INTRAVENOUS
  Filled 2017-03-07 (×13): qty 2

## 2017-03-07 MED ORDER — MAGNESIUM GLUCONATE 500 MG PO TABS
500.0000 mg | ORAL_TABLET | Freq: Every day | ORAL | Status: DC
  Administered 2017-03-07 – 2017-03-14 (×8): 500 mg via ORAL
  Filled 2017-03-07 (×8): qty 1

## 2017-03-07 MED ORDER — IPRATROPIUM BROMIDE 0.02 % IN SOLN
0.5000 mg | RESPIRATORY_TRACT | Status: DC
  Administered 2017-03-07 (×2): 0.5 mg via RESPIRATORY_TRACT
  Filled 2017-03-07 (×2): qty 2.5

## 2017-03-07 MED ORDER — ALBUTEROL SULFATE (2.5 MG/3ML) 0.083% IN NEBU: 2.5000 mg | INHALATION_SOLUTION | Freq: Four times a day (QID) | RESPIRATORY_TRACT | Status: DC

## 2017-03-07 MED ORDER — ONDANSETRON HCL 4 MG/2ML IJ SOLN: 4.0000 mg | Freq: Four times a day (QID) | INTRAMUSCULAR | Status: DC | PRN

## 2017-03-07 MED ORDER — GUAIFENESIN ER 600 MG PO TB12
600.0000 mg | ORAL_TABLET | Freq: Two times a day (BID) | ORAL | Status: DC
  Administered 2017-03-07 (×2): 600 mg via ORAL
  Filled 2017-03-07 (×2): qty 1

## 2017-03-07 MED ORDER — ACETAMINOPHEN 650 MG RE SUPP
650.0000 mg | Freq: Four times a day (QID) | RECTAL | Status: DC | PRN
Start: 2017-03-07 — End: 2017-03-14

## 2017-03-07 MED ORDER — ASPIRIN-ACETAMINOPHEN-CAFFEINE 250-250-65 MG PO TABS: 1.0000 | ORAL_TABLET | Freq: Four times a day (QID) | ORAL | Status: DC | PRN

## 2017-03-07 MED ORDER — ASPIRIN 81 MG PO TABS: 81.0000 mg | ORAL_TABLET | Freq: Every day | ORAL | Status: DC

## 2017-03-07 MED ORDER — ALBUTEROL SULFATE (2.5 MG/3ML) 0.083% IN NEBU
2.5000 mg | INHALATION_SOLUTION | RESPIRATORY_TRACT | Status: DC | PRN
  Administered 2017-03-09: 2.5 mg via RESPIRATORY_TRACT
  Filled 2017-03-07: qty 3

## 2017-03-07 MED ORDER — TRAZODONE HCL 50 MG PO TABS
25.0000 mg | ORAL_TABLET | Freq: Every evening | ORAL | Status: DC | PRN
  Administered 2017-03-08: 25 mg via ORAL
  Filled 2017-03-07: qty 1

## 2017-03-07 MED ORDER — FUROSEMIDE 20 MG PO TABS: 40.0000 mg | ORAL_TABLET | Freq: Two times a day (BID) | ORAL | Status: DC

## 2017-03-07 MED ORDER — CALCIUM CARBONATE-VITAMIN D 600-400 MG-UNIT PO TABS: 1.0000 | ORAL_TABLET | Freq: Every morning | ORAL | Status: DC

## 2017-03-07 MED ORDER — MAGNESIUM 500 MG PO CAPS: 1.0000 | ORAL_CAPSULE | Freq: Every day | ORAL | Status: DC

## 2017-03-07 MED ORDER — LORAZEPAM 2 MG/ML IJ SOLN
INTRAMUSCULAR | Status: AC
  Filled 2017-03-07: qty 1

## 2017-03-07 MED ORDER — LEVALBUTEROL HCL 0.63 MG/3ML IN NEBU
0.6300 mg | INHALATION_SOLUTION | Freq: Four times a day (QID) | RESPIRATORY_TRACT | Status: DC | PRN
  Administered 2017-03-07: 0.63 mg via RESPIRATORY_TRACT
  Filled 2017-03-07: qty 3

## 2017-03-07 MED ORDER — IPRATROPIUM BROMIDE 0.02 % IN SOLN
0.5000 mg | Freq: Once | RESPIRATORY_TRACT | Status: AC
  Administered 2017-03-07: 0.5 mg via RESPIRATORY_TRACT
  Filled 2017-03-07: qty 2.5

## 2017-03-07 MED ORDER — SENNOSIDES-DOCUSATE SODIUM 8.6-50 MG PO TABS
1.0000 | ORAL_TABLET | Freq: Every evening | ORAL | Status: DC | PRN
  Filled 2017-03-07: qty 1

## 2017-03-07 MED ORDER — FUROSEMIDE 40 MG PO TABS
40.0000 mg | ORAL_TABLET | Freq: Two times a day (BID) | ORAL | Status: DC
  Administered 2017-03-07 – 2017-03-10 (×6): 40 mg via ORAL
  Filled 2017-03-07 (×4): qty 1
  Filled 2017-03-07: qty 2
  Filled 2017-03-07: qty 1

## 2017-03-07 MED ORDER — ASPIRIN 81 MG PO CHEW
81.0000 mg | CHEWABLE_TABLET | Freq: Every day | ORAL | Status: DC
  Administered 2017-03-07 – 2017-03-14 (×8): 81 mg via ORAL
  Filled 2017-03-07 (×8): qty 1

## 2017-03-07 MED ORDER — IPRATROPIUM BROMIDE 0.02 % IN SOLN
0.5000 mg | Freq: Four times a day (QID) | RESPIRATORY_TRACT | Status: DC
  Administered 2017-03-07 – 2017-03-08 (×5): 0.5 mg via RESPIRATORY_TRACT
  Filled 2017-03-07 (×5): qty 2.5

## 2017-03-07 MED ORDER — SODIUM CHLORIDE 0.9 % IV SOLN
INTRAVENOUS | Status: AC
  Administered 2017-03-07: 15:00:00 via INTRAVENOUS

## 2017-03-07 MED ORDER — ENOXAPARIN SODIUM 30 MG/0.3ML ~~LOC~~ SOLN
30.0000 mg | SUBCUTANEOUS | Status: DC
  Administered 2017-03-07 – 2017-03-10 (×4): 30 mg via SUBCUTANEOUS
  Filled 2017-03-07 (×5): qty 0.3

## 2017-03-07 MED ORDER — ACETAMINOPHEN 325 MG PO TABS: 650.0000 mg | ORAL_TABLET | Freq: Four times a day (QID) | ORAL | Status: DC | PRN

## 2017-03-07 MED ORDER — HYDROCODONE-ACETAMINOPHEN 5-325 MG PO TABS: 1.0000 | ORAL_TABLET | ORAL | Status: DC | PRN

## 2017-03-07 MED ORDER — INSULIN ASPART 100 UNIT/ML ~~LOC~~ SOLN
0.0000 [IU] | Freq: Three times a day (TID) | SUBCUTANEOUS | Status: DC
  Administered 2017-03-07: 3 [IU] via SUBCUTANEOUS
  Administered 2017-03-07 – 2017-03-12 (×8): 1 [IU] via SUBCUTANEOUS
  Filled 2017-03-07 (×2): qty 1

## 2017-03-07 MED ORDER — ALBUTEROL SULFATE (2.5 MG/3ML) 0.083% IN NEBU
2.5000 mg | INHALATION_SOLUTION | RESPIRATORY_TRACT | Status: DC
  Administered 2017-03-07 (×2): 2.5 mg via RESPIRATORY_TRACT
  Filled 2017-03-07 (×2): qty 3

## 2017-03-07 MED ORDER — ONDANSETRON HCL 4 MG PO TABS: 4.0000 mg | ORAL_TABLET | Freq: Four times a day (QID) | ORAL | Status: DC | PRN

## 2017-03-07 MED ORDER — IPRATROPIUM-ALBUTEROL 0.5-2.5 (3) MG/3ML IN SOLN: 3.0000 mL | RESPIRATORY_TRACT | Status: DC | PRN

## 2017-03-07 MED ORDER — NEBIVOLOL HCL 5 MG PO TABS
5.0000 mg | ORAL_TABLET | Freq: Every day | ORAL | Status: DC
  Administered 2017-03-07 – 2017-03-14 (×8): 5 mg via ORAL
  Filled 2017-03-07 (×8): qty 1

## 2017-03-07 MED ORDER — CLONAZEPAM 0.5 MG PO TABS
0.2500 mg | ORAL_TABLET | Freq: Two times a day (BID) | ORAL | Status: DC
  Administered 2017-03-07: 0.25 mg via ORAL
  Filled 2017-03-07: qty 1

## 2017-03-07 MED ORDER — LEVOFLOXACIN 500 MG PO TABS
500.0000 mg | ORAL_TABLET | ORAL | Status: DC
  Administered 2017-03-09: 500 mg via ORAL
  Filled 2017-03-07 (×3): qty 1

## 2017-03-07 MED ORDER — LORAZEPAM 2 MG/ML IJ SOLN
0.5000 mg | Freq: Once | INTRAMUSCULAR | Status: AC
  Administered 2017-03-07: 0.5 mg via INTRAVENOUS

## 2017-03-07 MED ORDER — LOSARTAN POTASSIUM 50 MG PO TABS
50.0000 mg | ORAL_TABLET | Freq: Every day | ORAL | Status: DC
  Administered 2017-03-07 – 2017-03-14 (×8): 50 mg via ORAL
  Filled 2017-03-07 (×8): qty 1

## 2017-03-07 MED ORDER — CLONAZEPAM 0.5 MG PO TABS
0.2500 mg | ORAL_TABLET | Freq: Three times a day (TID) | ORAL | Status: DC | PRN
  Administered 2017-03-07 – 2017-03-11 (×5): 0.25 mg via ORAL
  Filled 2017-03-07 (×6): qty 1

## 2017-03-07 NOTE — Progress Notes (Signed)
Called by RN regarding frequency of nebulizers. Patient declining nebs. States makes her "jittery". Reports breathing no better but not worse either   PE Gen: sitting up in bed looking anxious and moderately sob CV: RRR no M/g/r no LE edema Resp: moderate increased work of breathing. Breath sound with little movement, faint wheeze no crackles. Tripod position, using accessory muscles.  Neuro: alert and oriented x3   Obj.  Resp 22 Oxy sat 92 on 3L HR 97 BP 99/50 ABG PH 7.40, PCo2 47.6, PO2, 112, Bicarb 29.5, total 31  Plan:  Continue nebs but change to every 6 hours Continue  steroids Increase frequency of anti-anxiety Will keep SD bed request for close monitoring  Follow Palliative care recommendation           Toya Smothers NP

## 2017-03-07 NOTE — ED Notes (Signed)
Pt states she doesn't feel like eating anything and would rather have a shake, ordered pt a high calorie shake and also order pt a hospital bed.

## 2017-03-07 NOTE — ED Notes (Signed)
CBG 225. 

## 2017-03-07 NOTE — ED Notes (Signed)
Report attempted 

## 2017-03-07 NOTE — Plan of Care (Signed)
Discussed patient with Dr. Manus Gunning. [name] is a 82 year old with pmh of emphysema/COPD, HTN, HLD, PVD, CAD; who presented with complaints of shortness of breath. Initial ABG revealed pH of 7.383, PCO2 52.7, PO2 167. Patient was placed on BiPAP due to respiratory distress with improvement respiratory rate. Chest x-ray showed no acute infiltrate. Patient had been given DuoNeb breathing treatment, 125 mg of Solu-Medrol, and 2 g of magnesium sulfate en route with EMS. In the emergency department patient received an hour-long nebulized treatment. Currently in the process of trying to wean patient off of BiPAP at this time. No bed order placed in case patient needs to higher level of care.

## 2017-03-07 NOTE — ED Notes (Signed)
Pt moved onto hospital bed. Pt is resting comfortably in bed with family at bed side.

## 2017-03-07 NOTE — ED Triage Notes (Signed)
Pt arrived from home via EMS. Per EMS pt has been progressively SOB, uses 2-3L [place] at home normally, and was found to be sating 92% Resp. 42/min. EMS placed of CPAP and administered , 125 solumedrol, and 2 duo nebs PTA

## 2017-03-07 NOTE — ED Notes (Signed)
Family at bedside. 

## 2017-03-07 NOTE — Progress Notes (Signed)
@  1935 Paged Dr. Antionette Char for Xopenex at pt's request to replace Albuterol. Order received. @2057  Paged Dr. Antionette Char for Bi-Pap due to increased WOB. Order received but ultimately not needed after pt's anti-anxiety medication administered.

## 2017-03-07 NOTE — ED Notes (Signed)
CBG 221. RN notified. 

## 2017-03-07 NOTE — ED Notes (Signed)
Report given to Melissa RN

## 2017-03-07 NOTE — Patient Outreach (Signed)
[facility]) Care Management  03/07/2017  [name] Jul 04, 1935 [id number]   Patient hospitalized with COPD exacerbation.  Hospital liaison made aware of admit.  Will notify physician of discipline closure.  Bary Leriche, RN, MSN [facility] Care Management RN Telephonic Health Coach [phone]

## 2017-03-07 NOTE — Progress Notes (Signed)
Patient off BIPAP for trial.  RT will continue to monitor.

## 2017-03-07 NOTE — ED Provider Notes (Addendum)
[facility] DEPT Provider Note   CSN: [id number] Arrival date & time: 03/07/17  1638     History   Chief Complaint No chief complaint on file.   HPI [name] is a 82 y.o. female.  Level V caveat for respiratory distress. Patient presents via EMS with respiratory distress and difficulty breathing worsening over the past 2 days. She does have a history of COPD on home oxygen 2-3 L at all times. Has been using her nebulizer at home with minimal relief. She arrives in distress on the palm by EMS. She received nebulizers, steroids and magnesium. She denies any chest pain or fever. Cough is nonproductive. No leg pain or leg swelling. Denies any cardiac history.  Denies any leg pain or leg swelling. She has required BiPAP in the past but never been intubated. Denies any history of heart failure.   The history is provided by the patient and the EMS personnel. The history is limited by the condition of the patient.    Past Medical History:  Diagnosis Date  . Actinomycosis, cervicofacial   . CAD (coronary artery disease)    a. coronary calcification on prior Chest CT;  b. Dob MV 1/14:  EF 85%, no ischemia  . COPD (chronic obstructive pulmonary disease) ([place])   . Emphysema   . HTN (hypertension)   . Hx of echocardiogram    Echocardiogram 04/16/12: Mild focal basal septal hypertrophy, EF 70-75% with dynamic obstruction with mid cavity obliteration, grade 1 diastolic dysfunction, MAC, atrial septal thickening with findings consistent with lipomatous hypertrophy  . Hyperlipidemia   . PVD (peripheral vascular disease) [facility])     Patient Active Problem List   Diagnosis Date Noted  . Shortness of breath 07/29/2016  . Acute diastolic heart failure ([place]) 01/27/2016  . Acute on chronic diastolic CHF (congestive heart failure), NYHA class 3 ([place]) 01/27/2016  . DOE (dyspnea on exertion) 01/27/2016  . Hypertensive heart disease with CHF (congestive heart failure) ([place]) 01/27/2016  .  Diastolic dysfunction 46/65/9935  . CAD in native artery   . HLD (hyperlipidemia)   . Anxiety state   . Hypertrophic cardiomyopathy ([place])   . Malnutrition of moderate degree 09/14/2015  . Elevated troponin   . Respiratory distress 09/09/2015  . Acute on chronic respiratory failure with hypoxia and hypercapnia ([place]) 09/09/2015  . Sepsis ([place]) 09/09/2015  . Leukocytosis   . Elevated troponin I measurement   . CAD (coronary artery disease), native coronary artery 01/17/2013  . Carotid stenosis 05/09/2012  . Dynamic left ventricular outflow obstruction 04/19/2012  . Multiple lung nodules 04/19/2012  . Hyperlipidemia 04/16/2012  . Hypoxemia 04/16/2012  . Respiratory distress syndrome of newborn 04/16/2012  . Acute respiratory failure ([place]) 07/09/2011  . HTN (hypertension) 07/08/2011  . Carotid artery disease ([place]) 03/03/2011  . Occlusion and stenosis of carotid artery without mention of cerebral infarction 02/14/2011  . COPD (chronic obstructive pulmonary disease) ([place]) 06/15/2010  . DYSPNEA 06/15/2010  . Osteoporosis 01/29/2010  . PVD 09/29/2008  . Aortic aneurysm ([place]) 03/10/2008  . Phlebitis and thrombophlebitis of superficial veins of upper extremities 10/17/2007  . 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 Iron deficiency anemia 09/08/2007  . Tachycardia 09/08/2007  . Embolism and thrombosis of other specified artery(444.89) 09/08/2007  . Anxiety 01/09/2007  . CANDIDIASIS, VAGINAL 12/12/2006  . ACTINOMYCOSIS, CERVICOFACIAL 12/01/2006  . EMPHYSEMA NEC 12/01/2006    Past Surgical History:  Procedure Laterality Date  . APPENDECTOMY    . BONE GRAFT HIP ILIAC  CREST    . TONSILLECTOMY      OB History    No data available       Home Medications    Prior to Admission medications   Medication Sig Start Date End Date Taking? Authorizing Provider  albuterol (PROVENTIL HFA;VENTOLIN HFA) 108 (90 BASE) MCG/ACT inhaler Inhale 2 puffs into the lungs every 6 (six)  hours as needed. For shortness of breath    [provider]  aspirin 81 MG tablet Take 81 mg by mouth daily.      [provider]  aspirin-acetaminophen-caffeine (EXCEDRIN MIGRAINE) [phone] MG tablet Take 1 tablet by mouth every 6 (six) hours as needed for headache.    [provider]  atorvastatin (LIPITOR) 20 MG tablet Take 1 tablet (20 mg total) by mouth daily. 04/29/16   Dorothy Spark, MD  budesonide-formoterol [facility]) 160-4.5 MCG/ACT inhaler Inhale 2 puffs into the lungs 2 (two) times daily. 12/12/16   Byrum, Rose Fillers, MD  BYSTOLIC 5 MG tablet TAKE 1 TABLET (5 MG TOTAL) BY MOUTH DAILY. 12/12/16   Imogene Burn, PA-C  Calcium Carb-Cholecalciferol (CALCIUM 600 + D PO) Take 1 tablet by mouth daily.    [provider]  clonazePAM (KLONOPIN) 0.5 MG tablet Take 0.5 tablets (0.25 mg total) by mouth 2 (two) times daily. 01/05/16   Collene Gobble, MD  furosemide (LASIX) 40 MG tablet Take 1 tablet (40 mg total) by mouth 2 (two) times daily. 01/26/17 04/26/17  Dorothy Spark, MD  losartan (COZAAR) 50 MG tablet Take 1 tablet (50 mg total) by mouth daily. 01/26/17 04/26/17  Dorothy Spark, MD  Magnesium 500 MG CAPS Take 1 capsule by mouth daily.    [provider]  Multiple Vitamin (MULTIVITAMIN) tablet Take 1 tablet by mouth daily.      [provider]  predniSONE (DELTASONE) 10 MG tablet 3 pills daily for 2 days, 2 pills daily for 2 days, 1 pill daily for 2 days Patient not taking: Reported on 02/27/2017 02/08/17   Chesley Mires, MD  SPIRIVA RESPIMAT 2.5 MCG/ACT AERS INHALE 2 PUFFS INTO THE LUNGS DAILY. 10/21/16   Collene Gobble, MD  UNABLE TO FIND Inhale 1.5-3 L into the lungs continuous.     [provider]    Family History Family History  Problem Relation Age of Onset  . Heart disease Mother   . Emphysema Father   . Heart disease Father   . Stomach cancer Father   . Coronary artery disease Unknown        family hx of  female 1st. degree relative <50/family hx of 1st. degree relative <60  . Emphysema Sister        SMOKER  . Stomach cancer Sister   . Emphysema Sister        SMOKER  . Coronary artery disease Brother        SMOKER  . Emphysema Sister     Social History Social History  Substance Use Topics  . Smoking status: Former Smoker    Packs/day: 1.00    Years: 49.00    Types: Cigarettes    Quit date: 07/11/2005  . Smokeless tobacco: Never Used     Comment: smoked for 48 years 1 ppd  . Alcohol use No     Allergies   Penicillins; Pneumococcal vaccine; and Pneumovax [pneumococcal polysaccharide vaccine]   Review of Systems Review of Systems  Unable to perform ROS: Severe respiratory distress     Physical  Exam Updated Vital Signs BP 100/68   Pulse (!) 118   Temp (!) 97.4 F (36.3 C) (Temporal)   Resp (!) 24   Ht _0  (1.549 m)   Wt 56.2 kg (124 lb)   SpO2 100%   BMI 23.43 kg/m   Physical Exam  Constitutional: She is oriented to person, place, and time. She appears well-developed and well-nourished. She appears distressed.  Tachypnea, tripoding on BiPAP respirations on the 40s.  HENT:  Head: Normocephalic and atraumatic.  Mouth/Throat: Oropharynx is clear and moist. No oropharyngeal exudate.  Eyes: Pupils are equal, round, and reactive to light. Conjunctivae and EOM are normal. Right eye exhibits no discharge. Left eye exhibits no discharge.  Neck: Normal range of motion. Neck supple.  No meningismus.  Cardiovascular: Normal rate, normal heart sounds and intact distal pulses.   No murmur heard. tachycardic  Pulmonary/Chest: She is in respiratory distress.  Very diminished breath sounds with minimal auditory wheezing, poor air exchange  Abdominal: Soft. There is no tenderness. There is no rebound and no guarding.  Musculoskeletal: Normal range of motion. She exhibits no edema or tenderness.  Neurological: She is alert and oriented to person, place, and time. No cranial  nerve deficit. She exhibits normal muscle tone. Coordination normal.   5/5 strength throughout. CN 2-12 intact.Equal grip strength.   Skin: Skin is warm. Capillary refill takes less than 2 seconds.  Psychiatric: She has a normal mood and affect. Her behavior is normal.  Nursing note and vitals reviewed. which is   ED Treatments / Results  Labs (all labs ordered are listed, but only abnormal results are displayed) Labs Reviewed  CBC WITH DIFFERENTIAL/PLATELET - Abnormal; Notable for the following:       Result Value   RBC 3.71 (*)    Hemoglobin 11.4 (*)    HCT 35.0 (*)    All other components within normal limits  BASIC METABOLIC PANEL - Abnormal; Notable for the following:    Chloride 99 (*)    Glucose, Bld 187 (*)    BUN 32 (*)    Creatinine, Ser 1.26 (*)    GFR calc non Af Amer 39 (*)    GFR calc Af Amer 46 (*)    All other components within normal limits  I-STAT ARTERIAL BLOOD GAS, ED - Abnormal; Notable for the following:    pCO2 arterial 52.7 (*)    pO2, Arterial 167.0 (*)    Bicarbonate 31.6 (*)    TCO2 33 (*)    Acid-Base Excess 5.0 (*)    All other components within normal limits  I-STAT VENOUS BLOOD GAS, ED - Abnormal; Notable for the following:    pH, Ven 7.497 (*)    pO2, Ven 128.0 (*)    Bicarbonate 34.5 (*)    TCO2 36 (*)    Acid-Base Excess 10.0 (*)    All other components within normal limits  BRAIN NATRIURETIC PEPTIDE  TROPONIN I    EKG  EKG Interpretation  Date/Time:  Tuesday March 07 2017 04:58:36 EDT Ventricular Rate:  117 PR Interval:    QRS Duration: 64 QT Interval:  328 QTC Calculation: 458 R Axis:   81 Text Interpretation:  Sinus tachycardia Atrial premature complex Right atrial enlargement Borderline right axis deviation Minimal ST depression, inferior leads No significant change was found Confirmed by Ezequiel Essex [phone]) on 03/07/2017 5:31:51 AM       Radiology Dg Chest Portable 1 View  Result Date: 03/07/2017 CLINICAL DATA:  Shortness of breath EXAM: PORTABLE CHEST 1 VIEW COMPARISON:  02/08/2017 FINDINGS: Hyperinflation. Right lower probable nipple shadow. No acute consolidation or effusion. Stable cardiomediastinal silhouette with atherosclerosis. Emphysematous changes. Scarring in the right upper lobe. No pneumothorax. IMPRESSION: Hyperinflation with emphysematous changes and scarring in the right lung a PACs. No acute infiltrate or edema Electronically Signed   By: Donavan Foil M.D.   On: 03/07/2017 04:20    Procedures Procedures (including critical care time)  Medications Ordered in ED Medications  albuterol (PROVENTIL,VENTOLIN) solution continuous neb (not administered)  ipratropium (ATROVENT) nebulizer solution 0.5 mg (not administered)     Initial Impression / Assessment and Plan / ED Course  I have reviewed the triage vital signs and the nursing notes.  Pertinent labs & imaging results that were available during my care of the patient were reviewed by me and considered in my medical decision making (see chart for details).    Patient arrives from home with respiratory distress. She has a history of COPD on home oxygen. As increased difficulty breathing with tachypnea over the past day. Using her nebulizers at home without relief.  Continues BiPAP on arrival. Very diminished breath sounds with scattered respiratory wheezing. Chest x-ray shows no significant infiltrate.  Continued on bronchodilators, magnesium, steroids. Patient improving. ABG shows no significant CO2 retention.  Plan admission for COPD exacerbation. Discussed with Dr. Tamala Julian.  CRITICAL CARE Performed by: Ezequiel Essex Total critical care time: 40 minutes Critical care time was exclusive of separately billable procedures and treating other patients. Critical care was necessary to treat or prevent imminent or life-threatening deterioration. Critical care was time spent personally by me on the following activities: development of  treatment plan with patient and/or surrogate as well as nursing, discussions with consultants, evaluation of patient's response to treatment, examination of patient, obtaining history from patient or surrogate, ordering and performing treatments and interventions, ordering and review of laboratory studies, ordering and review of radiographic studies, pulse oximetry and re-evaluation of patient's condition.   Final Clinical Impressions(s) / ED Diagnoses   Final diagnoses:  COPD exacerbation [facility])    New Prescriptions New Prescriptions   No medications on file     Ezequiel Essex, MD 03/07/17 [id number]    Ezequiel Essex, MD 03/07/17 [phone]

## 2017-03-07 NOTE — ED Notes (Signed)
Karen Black NP at bedside  

## 2017-03-07 NOTE — ED Notes (Signed)
CBG 133 

## 2017-03-07 NOTE — ED Notes (Signed)
Meal Tray ordered.  

## 2017-03-07 NOTE — Progress Notes (Signed)
PT Cancellation Note  Patient Details Name: [name] MRN: [id number] DOB: 1935-03-16   Cancelled Treatment:    Reason Eval/Treat Not Completed: Patient not medically ready. Pt awaiting step down bed and remains SOB. Pt just got off BiPap. . Acute PT to return as able.   Parul Porcelli M Juanjesus Pepperman 03/07/2017, 1:46 PM   Lewis Shock, PT, DPT Pager #: [phone] Office #: [phone]

## 2017-03-07 NOTE — Progress Notes (Signed)
RT called to pt's room for increased WOB, RN concerned about pt resp status. Pt states this has become "her new normal" way of breathing, despite pt with purse lip breathing. Pt states he breathing is no worse than when she came in. Pt offered HHN breathing tx, but states they make her too jittery and will wait to her next scheduled one tonight (Q6). Pt informed if she changes her mind and needs a tx she can call and RT will come back give one anytime. RN made aware. RT will continue to closely monitor pt

## 2017-03-07 NOTE — H&P (Signed)
History and Physical    [name] DJS:[id number] DOB: 1935-04-06 DOA: 03/07/2017  PCP: Velna Hatchet, MD Patient coming from: home  Chief Complaint: worsening sob  HPI: [name] is a pleasant 82 y.o. female with medical history significant for hypoxic respiratory failure on home oxygen related to COPD, diastolic dysfunction, hypertension, CAD, anemia, anxiety presents to the emergency department with the chief complaint of worsening shortness of breath. Initial evaluation reveals acute on chronic respiratory failure likely related to COPD exacerbation.  Information is obtained from the chart and the patient. She states that over the last week she's experience worsening shortness of breath. She is also had intermittent productive cough and mild increase in lower extremity edema. She denies chest pain palpitations headache dizziness syncope or near-syncope. She denies any abdominal pain nausea vomiting diarrhea constipation melena or bright red blood per rectum. She saw her neurologist 3 weeks ago she was placed on prednisone which she's completed. She also reports she saw a cardiologist around the same time increase her Lasix from 20-40 mg.    ED Course: She is provided with Solu-Medrol nebulizers, and magnesium per EMS. and placed on BiPAP. At the time of admission he is on BiPAP trial. She is hemodynamically stable afebrile  Review of Systems: As per HPI otherwise all other systems reviewed and are negative.   Ambulatory Status: Ambulates intermittently with a walker. She lives at home with her husband. Is fairly independent with ADLs  Past Medical History:  Diagnosis Date  . Actinomycosis, cervicofacial   . CAD (coronary artery disease)    a. coronary calcification on prior Chest CT;  b. Dob MV 1/14:  EF 85%, no ischemia  . COPD (chronic obstructive pulmonary disease) ([place])   . Emphysema   . HTN (hypertension)   . Hx of echocardiogram    Echocardiogram 04/16/12: Mild  focal basal septal hypertrophy, EF 70-75% with dynamic obstruction with mid cavity obliteration, grade 1 diastolic dysfunction, MAC, atrial septal thickening with findings consistent with lipomatous hypertrophy  . Hyperlipidemia   . PVD (peripheral vascular disease) ([place])     Past Surgical History:  Procedure Laterality Date  . APPENDECTOMY    . BONE GRAFT HIP ILIAC CREST    . TONSILLECTOMY      Social History   Social History  . Marital status: Married    Spouse name: N/A  . Number of children: 4  . Years of education: N/A   Occupational History  . retired Retired    Air cabin crew   Social History Main Topics  . Smoking status: Former Smoker    Packs/day: 1.00    Years: 49.00    Types: Cigarettes    Quit date: 07/11/2005  . Smokeless tobacco: Never Used     Comment: smoked for 48 years 1 ppd  . Alcohol use No  . Drug use: No  . Sexual activity: Not on file   Other Topics Concern  . Not on file   Social History Narrative  . No narrative on file    Allergies  Allergen Reactions  . Penicillins Hives    Has taken it since.  . Pneumococcal Vaccine Swelling    Severe localized swelling in arm  . Pneumovax [Pneumococcal Polysaccharide Vaccine] Hives    Family History  Problem Relation Age of Onset  . Heart disease Mother   . Emphysema Father   . Heart disease Father   . Stomach cancer Father   . Coronary artery disease Unknown  family hx of female 1st. degree relative <50/family hx of 1st. degree relative <60  . Emphysema Sister        SMOKER  . Stomach cancer Sister   . Emphysema Sister        SMOKER  . Coronary artery disease Brother        SMOKER  . Emphysema Sister     Prior to Admission medications   Medication Sig Start Date End Date Taking? Authorizing Provider  albuterol (PROVENTIL HFA;VENTOLIN HFA) 108 (90 BASE) MCG/ACT inhaler Inhale 2 puffs into the lungs every 6 (six) hours as needed. For shortness of breath   Yes [provider]  aspirin 81 MG tablet Take 81 mg by mouth daily.     Yes [provider]  aspirin-acetaminophen-caffeine (EXCEDRIN MIGRAINE) [phone] MG tablet Take 1 tablet by mouth every 6 (six) hours as needed for headache.   Yes [provider]  atorvastatin (LIPITOR) 20 MG tablet Take 1 tablet (20 mg total) by mouth daily. 04/29/16  Yes Dorothy Spark, MD  budesonide-formoterol [facility]) 160-4.5 MCG/ACT inhaler Inhale 2 puffs into the lungs 2 (two) times daily. 12/12/16  Yes Byrum, Rose Fillers, MD  BYSTOLIC 5 MG tablet TAKE 1 TABLET (5 MG TOTAL) BY MOUTH DAILY. 12/12/16  Yes Imogene Burn, PA-C  Calcium Carb-Cholecalciferol (CALCIUM 600 + D PO) Take 1 tablet by mouth daily.   Yes [provider]  clonazePAM (KLONOPIN) 0.5 MG tablet Take 0.5 tablets (0.25 mg total) by mouth 2 (two) times daily. 01/05/16  Yes Collene Gobble, MD  furosemide (LASIX) 40 MG tablet Take 1 tablet (40 mg total) by mouth 2 (two) times daily. 01/26/17 04/26/17 Yes Dorothy Spark, MD  losartan (COZAAR) 50 MG tablet Take 1 tablet (50 mg total) by mouth daily. 01/26/17 04/26/17 Yes Dorothy Spark, MD  Magnesium 500 MG CAPS Take 1 capsule by mouth daily.   Yes [provider]  Multiple Vitamin (MULTIVITAMIN) tablet Take 1 tablet by mouth daily.     Yes [provider]  SPIRIVA RESPIMAT 2.5 MCG/ACT AERS INHALE 2 PUFFS INTO THE LUNGS DAILY. 10/21/16  Yes Collene Gobble, MD  UNABLE TO FIND Inhale 1.5-3 L into the lungs continuous.     [provider]    Physical Exam: Vitals:   03/07/17 0430 03/07/17 0500 03/07/17 0600 03/07/17 0645  BP: 128/81 100/68 111/82 111/64  Pulse: (!) 111 (!) 118 (!) 127 (!) 116  Resp: (!) 23 (!) 24 (!) 27 (!) 27  Temp:      TempSrc:      SpO2: 100% 100% 100% 97%  Weight:      Height:         General:  Appears Somewhat anxious somewhat frail sitting straight up in bed mild distress Eyes:  PERRL, EOMI, normal lids, iris ENT:  grossly  normal hearing, lips & tongue, mucous membranes of her mouth are pink but dry Neck:  no LAD, masses or thyromegaly Cardiovascular:  Tachycardic but regular, no m/r/g. No LE edema.  Respiratory:  Moderate increased work of breathing at rest. Using accessory muscles. Breath sounds are quite faint do hear some some air movement along with end expiratory wheezing Abdomen:  soft, ntnd, positive bowel sounds no guarding or rebounding Skin:  no rash or induration seen on limited exam Musculoskeletal:  grossly normal tone BUE/BLE, good ROM, no bony abnormality Psychiatric:  grossly normal mood and affect, speech fluent and appropriate, AOx3 Neurologic:  CN 2-12  grossly intact, moves all extremities in coordinated fashion, sensation intact  Labs on Admission: I have personally reviewed following labs and imaging studies  CBC:  Recent Labs Lab 03/07/17 0335  WBC 10.2  NEUTROABS 5.4  HGB 11.4*  HCT 35.0*  MCV 94.3  PLT 027   Basic Metabolic Panel:  Recent Labs Lab 03/07/17 0335  NA 139  K 4.2  CL 99*  CO2 27  GLUCOSE 187*  BUN 32*  CREATININE 1.26*  CALCIUM 9.2   GFR: Estimated Creatinine Clearance: 27.3 mL/min (A) (by C-G formula based on SCr of 1.26 mg/dL (H)). Liver Function Tests: No results for input(s): AST, ALT, ALKPHOS, BILITOT, PROT, ALBUMIN in the last 168 hours. No results for input(s): LIPASE, AMYLASE in the last 168 hours. No results for input(s): AMMONIA in the last 168 hours. Coagulation Profile: No results for input(s): INR, PROTIME in the last 168 hours. Cardiac Enzymes:  Recent Labs Lab 03/07/17 0335  TROPONINI <0.03   BNP (last 3 results)  Recent Labs  07/15/16 1542 01/26/17 1137  PROBNP 90 91   HbA1C: No results for input(s): HGBA1C in the last 72 hours. CBG: No results for input(s): GLUCAP in the last 168 hours. Lipid Profile: No results for input(s): CHOL, HDL, LDLCALC, TRIG, CHOLHDL, LDLDIRECT in the last 72 hours. Thyroid Function  Tests: No results for input(s): TSH, T4TOTAL, FREET4, T3FREE, THYROIDAB in the last 72 hours. Anemia Panel: No results for input(s): VITAMINB12, FOLATE, FERRITIN, TIBC, IRON, RETICCTPCT in the last 72 hours. Urine analysis:    Component Value Date/Time   COLORURINE YELLOW 10/16/2009 2023   APPEARANCEUR CLEAR 10/16/2009 2023   LABSPEC 1.018 10/16/2009 2023   PHURINE 6.0 10/16/2009 2023   GLUCOSEU NEGATIVE 10/16/2009 2023   HGBUR NEGATIVE 10/16/2009 2023   BILIRUBINUR NEGATIVE 10/16/2009 2023   KETONESUR 15 (A) 10/16/2009 2023   PROTEINUR 30 (A) 10/16/2009 2023   UROBILINOGEN 0.2 10/16/2009 2023   NITRITE NEGATIVE 10/16/2009 2023   LEUKOCYTESUR NEGATIVE 10/16/2009 2023    Creatinine Clearance: Estimated Creatinine Clearance: 27.3 mL/min (A) (by C-G formula based on SCr of 1.26 mg/dL (H)).  Sepsis Labs: @LABRCNTIP (procalcitonin:4,lacticidven:4) )No results found for this or any previous visit (from the past 240 hour(s)).   Radiological Exams on Admission: Dg Chest Portable 1 View  Result Date: 03/07/2017 CLINICAL DATA:  Shortness of breath EXAM: PORTABLE CHEST 1 VIEW COMPARISON:  02/08/2017 FINDINGS: Hyperinflation. Right lower probable nipple shadow. No acute consolidation or effusion. Stable cardiomediastinal silhouette with atherosclerosis. Emphysematous changes. Scarring in the right upper lobe. No pneumothorax. IMPRESSION: Hyperinflation with emphysematous changes and scarring in the right lung a PACs. No acute infiltrate or edema Electronically Signed   By: Donavan Foil M.D.   On: 03/07/2017 04:20    EKG: Independently reviewed. Sinus tachycardia Atrial premature complex Right atrial enlargement Borderline right axis deviation Minimal ST depression, inferior leads No significant change was found  Assessment/Plan Principal Problem:   Acute on chronic respiratory failure (HCC) Active Problems:   HTN (hypertension)   CAD (coronary artery disease), native coronary artery    HLD (hyperlipidemia)   Hypertrophic cardiomyopathy (HCC)   Anxiety   Iron deficiency anemia   Tachycardia   Diastolic dysfunction   COPD with acute exacerbation ([place])   #1. Acute on chronic respiratory failure likely related to COPD with acute exacerbation. Patient is on home oxygen at 2-3 L 24/ 7. Initial ABG reveals pH 7.38, PCO2 52.7, PO2 167. Chest x-ray reveals hyperinflation with emphysematous changes and scarring in  the right lung. No infiltrate. EKG is tachycardia at 101 with atrial premature complex right atrial enlargement. Time of admission she is undergoing a BiPAP wean with moderate increased work of breathing and oxygen saturation level 88% on 3 L nasal cannula -Admit to step down -Scheduled nebs -Solu-Medrol -Levaquin -Monitor respiratory effort and oxygen saturation level and put back on BiPAP as indicated  #2. COPD with acute exacerbation. 2 unclear. Chart review indicates patient has had worsening shortness of breath over the last couple of weeks. Saw her pulmonology M.D. 3 weeks ago. Office note indicates she was given a course of prednisone, antibiotics were deferred at that time. Office note also indicates considering changing to nebulized LAMA, LABA and ICS. In addition office note indicates patient may benefit from palliative care assessment -Nebulizers as noted above -Continue Spiriva and Symbicort -Solu-Medrol -Palliative care consult  #3. Tachycardia/CAD. Likely related to nebs. Mild. EKG as noted above. Initial troponin negative. Patient denies chest pain. Review indicates recent Myoview low risk -Monitor on telemetry -Continue home meds  #4. Hyperglycemia. Likely steroid-induced. Serum glucose 187 on admission.  -Hemoglobin A1c -Sliding scale insulin  #5. Hypertension. Lopressor, low end of normal in the emergency department. Home medications include Bystolic, Lasix, Cozaar -Hold Lasix for now -Continue home meds -Monitor  #6. Diastolic  dysfunction/hypertrophic cardiomyopathy. Appears stable at baseline Chart review indicates office visit with cardiology 2 weeks ago. Echo in March 2017 showed vigorous LVEF with an EF of 65% mild grade 1 diastolic dysfunction. -Daily weights -Monitor intake and output -Hold Lasix until tomorrow -Monitor  #7. Acute kidney injury. Creatinine 1.26. Likely related decreased oral intake in the setting of Lasix and Cozaar. -Gentle IV fluids -Hold Lasix for now -Recheck in the morning  #8. Iron deficiency anemia. Hemoglobin 11.4 on admission. This appears close to her baseline -Continue home meds  #9. Anxiety. Patient appears slightly anxious. -Continue home meds   DVT prophylaxis:  lovenox Code Status: full  Family Communication: husband at bedside  Disposition Plan: home  Consults called: palliative care  Admission status: inpateint    Radene Gunning MD [facility]  If 7PM-7AM, please contact night-coverage [email or web address] Password [email or web address]  03/07/2017, 7:10 AM

## 2017-03-08 DIAGNOSIS — R000 Tachycardia, unspecified: Secondary | ICD-10-CM

## 2017-03-08 DIAGNOSIS — Z515 Encounter for palliative care: Secondary | ICD-10-CM

## 2017-03-08 DIAGNOSIS — R739 Hyperglycemia, unspecified: Secondary | ICD-10-CM

## 2017-03-08 DIAGNOSIS — I2510 Atherosclerotic heart disease of native coronary artery without angina pectoris: Secondary | ICD-10-CM

## 2017-03-08 DIAGNOSIS — Z7189 Other specified counseling: Secondary | ICD-10-CM

## 2017-03-08 DIAGNOSIS — I519 Heart disease, unspecified: Secondary | ICD-10-CM

## 2017-03-08 DIAGNOSIS — F419 Anxiety disorder, unspecified: Secondary | ICD-10-CM

## 2017-03-08 LAB — BASIC METABOLIC PANEL
ANION GAP: 10 (ref 5–15)
BUN: 31 mg/dL — ABNORMAL HIGH (ref 6–20)
CALCIUM: 8.9 mg/dL (ref 8.9–10.3)
CO2: 29 mmol/L (ref 22–32)
Chloride: 97 mmol/L — ABNORMAL LOW (ref 101–111)
Creatinine, Ser: 0.94 mg/dL (ref 0.44–1.00)
GFR calc Af Amer: >60 mL/min (ref >60)
GFR, EST NON AFRICAN AMERICAN: 56 mL/min — AB (ref >60)
GLUCOSE: 144 mg/dL — AB (ref 65–99)
POTASSIUM: 4.7 mmol/L (ref 3.5–5.1)
SODIUM: 136 mmol/L (ref 135–145)

## 2017-03-08 LAB — GLUCOSE, CAPILLARY
GLUCOSE-CAPILLARY: 133 mg/dL — AB (ref 65–99)
GLUCOSE-CAPILLARY: 112 mg/dL — AB (ref 65–99)
GLUCOSE-CAPILLARY: 151 mg/dL — AB (ref 65–99)
GLUCOSE-CAPILLARY: 126 mg/dL — AB (ref 65–99)

## 2017-03-08 LAB — CBC
HCT: 29.5 % — ABNORMAL LOW (ref 36.0–46.0)
HEMOGLOBIN: 9.7 g/dL — AB (ref 12.0–15.0)
MCH: 30.7 pg (ref 26.0–34.0)
MCHC: 32.9 g/dL (ref 30.0–36.0)
MCV: 93.4 fL (ref 78.0–100.0)
Platelets: 332 10*3/uL (ref 150–400)
RBC: 3.16 MIL/uL — ABNORMAL LOW (ref 3.87–5.11)
RDW: 13 % (ref 11.5–15.5)
WBC: 11.7 10*3/uL — AB (ref 4.0–10.5)

## 2017-03-08 MED ORDER — LEVALBUTEROL HCL 0.63 MG/3ML IN NEBU
0.6300 mg | INHALATION_SOLUTION | Freq: Three times a day (TID) | RESPIRATORY_TRACT | Status: DC
  Administered 2017-03-09 (×2): 0.63 mg via RESPIRATORY_TRACT
  Filled 2017-03-08 (×3): qty 3

## 2017-03-08 MED ORDER — LEVALBUTEROL HCL 0.63 MG/3ML IN NEBU
0.6300 mg | INHALATION_SOLUTION | Freq: Four times a day (QID) | RESPIRATORY_TRACT | Status: DC
  Administered 2017-03-08 (×2): 0.63 mg via RESPIRATORY_TRACT
  Filled 2017-03-08 (×2): qty 3

## 2017-03-08 MED ORDER — GUAIFENESIN ER 600 MG PO TB12
1200.0000 mg | ORAL_TABLET | Freq: Two times a day (BID) | ORAL | Status: DC
  Administered 2017-03-08 – 2017-03-14 (×13): 1200 mg via ORAL
  Filled 2017-03-08 (×13): qty 2

## 2017-03-08 MED ORDER — IPRATROPIUM BROMIDE 0.02 % IN SOLN
0.5000 mg | Freq: Three times a day (TID) | RESPIRATORY_TRACT | Status: DC
  Administered 2017-03-09 (×2): 0.5 mg via RESPIRATORY_TRACT
  Filled 2017-03-08 (×3): qty 2.5

## 2017-03-08 NOTE — Consult Note (Signed)
Consultation Note Date: 03/08/2017   Patient Name: [name]  DOB: 1935-07-09  MRN: [id number]  Age / Sex: 82 y.o., female  PCP: Velna Hatchet, MD Referring Physician: Kerney Elbe, DO  Reason for Consultation: Establishing goals of care and Psychosocial/spiritual support  HPI/Patient Profile: 82 y.o. female  with past medical history of GOLD stage 4 COPD with emphysema on 3L chronic O2, chronic respiratory failure with hypoxia and hypercapnia, dCHF, HTN, iron deficiency anemia, and CAD. She now presents from home with worsening shortness of breath. She was admitted on 03/07/2017 for acute on chronic respiratory failure secondary to COPD exacerbation. She required BiPAP initially, and then was able to be weaned off. Palliative consulted to assist in clarifying goals of care.   Clinical Assessment and Goals of Care: I met with [name] and her husband at her bedside. She was in the bedside chair having just finished lunch. I introduced myself and introduced the role of Palliative Care in her care team. She was open to talking with me and was glad her husband was present for the conversation.  [name] gave me a good overview of how things have been going over the past few years. She shared that she has found her breathing has been getting progressively worse and, even with continuous oxygen, her tolerance for activities have been declining. She is now at the point that she can complete all of her own self-care needs, however is unable to do any house tasks. Her husband has been assuming responsibility for everything, including shopping, cooking, cleaning, laundry, and lawn-care. He has been managing so far, but recognizes he may need help in the future.   We then had a good conversation about COPD, including talking through the natural disease trajectory and clinical signs to monitor for that signify worsening disease. While her activity  tolerance is quite poor at this point, she has not experienced frequent exacerbations and has been able to be managed outpatient with very infrequent hospitalizations. I shared the potential of recurrent exacerbations, with closer frequency. I also talked through self-care activites--healthy eating, adequate sleep, avoiding sick people--that she can focus her energy on in an effort to optimize her overall health. Of note, her father and multiple sisters had COPD (two sisters died from the disease). She has a good grasp on what the future may look like, as well as the usefulness of Hospice support to ensure comfort.    In talking through goals, she is still at the point that she wants full aggressive care, including hospitalizations for acute issues. We talked through the point when that type of care (i.e aggressive) may change. She expressed that she would want a shift towards comfort focused care when she is spending the majority of her time in the hospital with minimal improvement, or when she is spending the majority of her time in bed at home and unable to meaningfully interact with her family (she has four children, all local). Being able to verbally engage her family and having the energy/respiratory tolerance to be involved with family activity is the most important thing for her. I reinforced the value of keeping this goal at the center of her decisions, and that she has the right to determine how and where she spends her time--whatever she has left.   Unfortunately, I did not have a chance to discuss code status. Family arrived and there was not an appropriate time to bring it up.   Primary Decision Maker PATIENT  SUMMARY OF RECOMMENDATIONS    Full code, full scope aggressive care  PMT will f/u tomorrow and discuss code status  Code Status/Advance Care Planning:  Full code  Additional Recommendations (Limitations, Scope, Preferences):  Full Scope Treatment  Psycho-social/Spiritual:     Desire for further Chaplaincy support:no  Additional Recommendations: Education on Hospice  Prognosis:   Unable to determine  Discharge Planning: Home with Home Health      Primary Diagnoses: Present on Admission: . Anxiety . CAD (coronary artery disease), native coronary artery . COPD with acute exacerbation ([place]) . HLD (hyperlipidemia) . HTN (hypertension) . Iron deficiency anemia . Tachycardia . Acute on chronic respiratory failure ([place]) . Diastolic dysfunction . Hyperglycemia  I have reviewed the medical record, interviewed the patient and family, and examined the patient. The following aspects are pertinent.  Past Medical History:  Diagnosis Date  . Actinomycosis, cervicofacial   . CAD (coronary artery disease)    a. coronary calcification on prior Chest CT;  b. Dob MV 1/14:  EF 85%, no ischemia  . COPD (chronic obstructive pulmonary disease) ([place])   . Emphysema   . HTN (hypertension)   . Hx of echocardiogram    Echocardiogram 04/16/12: Mild focal basal septal hypertrophy, EF 70-75% with dynamic obstruction with mid cavity obliteration, grade 1 diastolic dysfunction, MAC, atrial septal thickening with findings consistent with lipomatous hypertrophy  . Hyperlipidemia   . PVD (peripheral vascular disease) ([place])    Social History   Social History  . Marital status: Married    Spouse name: N/A  . Number of children: 4  . Years of education: N/A   Occupational History  . retired Retired    Air cabin crew   Social History Main Topics  . Smoking status: Former Smoker    Packs/day: 1.00    Years: 49.00    Types: Cigarettes    Quit date: 07/11/2005  . Smokeless tobacco: Never Used     Comment: smoked for 48 years 1 ppd  . Alcohol use No  . Drug use: No  . Sexual activity: Not Asked   Other Topics Concern  . None   Social History Narrative  . None   Family History  Problem Relation Age of Onset  . Heart disease Mother   . Emphysema Father   . Heart  disease Father   . Stomach cancer Father   . Coronary artery disease Unknown        family hx of female 1st. degree relative <50/family hx of 1st. degree relative <60  . Emphysema Sister        SMOKER  . Stomach cancer Sister   . Emphysema Sister        SMOKER  . Coronary artery disease Brother        SMOKER  . Emphysema Sister    Scheduled Meds: . aspirin  81 mg Oral Daily  . atorvastatin  20 mg Oral Daily  . calcium-vitamin D  1 tablet Oral Q breakfast  . enoxaparin (LOVENOX) injection  30 mg Subcutaneous Q24H  . furosemide  40 mg Oral BID  . guaiFENesin  600 mg Oral BID  . insulin aspart  0-9 Units Subcutaneous TID WC  . ipratropium  0.5 mg Nebulization Q6H  . levofloxacin  500 mg Oral Q48H  . losartan  50 mg Oral Daily  . magnesium gluconate  500 mg Oral Daily  . methylPREDNISolone (SOLU-MEDROL) injection  60 mg Intravenous Q6H  . mometasone-formoterol  2 puff Inhalation BID  .  nebivolol  5 mg Oral Daily   Continuous Infusions: PRN Meds:.acetaminophen **OR** acetaminophen, albuterol, aspirin-acetaminophen-caffeine, clonazePAM, HYDROcodone-acetaminophen, levalbuterol, ondansetron **OR** ondansetron (ZOFRAN) IV, senna-docusate, traZODone Allergies  Allergen Reactions  . Levaquin [Levofloxacin In D5w] Other (See Comments)    Can't sleep   . Penicillins Hives    Has taken it since.  . Pneumococcal Vaccine Swelling    Severe localized swelling in arm  . Pneumovax [Pneumococcal Polysaccharide Vaccine] Hives   Review of Systems  Constitutional: Positive for activity change and fatigue. Negative for appetite change.  HENT: Positive for congestion. Negative for facial swelling, hearing loss, sinus pressure, sore throat and trouble swallowing.   Eyes: Negative for visual disturbance.  Respiratory: Positive for shortness of breath and wheezing. Negative for chest tightness.   Cardiovascular: Negative for chest pain, palpitations and leg swelling.  Gastrointestinal: Negative for  abdominal distention, abdominal pain and nausea.  Genitourinary: Negative for difficulty urinating.  Musculoskeletal: Positive for gait problem (r/t breathing). Negative for back pain.  Skin: Positive for color change (bruising).  Neurological: Negative for dizziness, speech difficulty and light-headedness.  Hematological: Bruises/bleeds easily.  Psychiatric/Behavioral: Negative for confusion, decreased concentration and sleep disturbance. The patient is nervous/anxious.    Physical Exam  Constitutional: She is oriented to person, place, and time. She appears well-developed and well-nourished. No distress.  HENT:  Head: Normocephalic and atraumatic.  Mouth/Throat: Oropharynx is clear and moist. No oropharyngeal exudate.  Eyes: EOM are normal.  Neck: Normal range of motion. Neck supple.  Cardiovascular: Normal rate and regular rhythm.   Pulmonary/Chest: No accessory muscle usage. Tachypnea noted. She has decreased breath sounds in the right lower field and the left lower field. She has wheezes.  Moderate SOB with prolonged conversation, maintaining O2 saturation during conversation--as noted on bedside monitor  Abdominal: Soft. Bowel sounds are normal.  Musculoskeletal: Normal range of motion. She exhibits no edema.  Neurological: She is alert and oriented to person, place, and time.  Skin: Skin is warm and dry. Bruising noted.  Psychiatric: She has a normal mood and affect. Her speech is normal and behavior is normal. Judgment and thought content normal. Cognition and memory are normal.   Vital Signs: BP 125/78 (BP Location: Right Arm)   Pulse 89   Temp 98.8 F (37.1 C) (Oral)   Resp (!) 22   Ht _0  (1.549 m)   Wt 57.9 kg (127 lb 11.2 oz)   SpO2 100%   BMI 24.13 kg/m  Pain Assessment: No/denies pain   Pain Score: 0-No pain  SpO2: SpO2: 100 % O2 Device:SpO2: 100 % O2 Flow Rate: .O2 Flow Rate (L/min): 3 L/min  IO: Intake/output summary:  Intake/Output Summary (Last 24  hours) at 03/08/17 0907 Last data filed at 03/08/17 0500  Gross per 24 hour  Intake              360 ml  Output              700 ml  Net             -340 ml   LBM:   Baseline Weight: Weight: 56.2 kg (124 lb) Most recent weight: Weight: 57.9 kg (127 lb 11.2 oz)     Palliative Assessment/Data: PPS 50-60%    Time Total: 50 minutes Greater than 50%  of this time was spent counseling and coordinating care related to the above assessment and plan.  Signed by: Charlynn Court, NP Palliative Medicine Team Pager # [phone] (M-F 7a-5p) Team Phone #  [phone] (Nights/Weekends)

## 2017-03-08 NOTE — Progress Notes (Signed)
PT Cancellation Note  Patient Details Name: [name] MRN: [id number] DOB: 10-01-1934   Cancelled Treatment:    Reason Eval/Treat Not Completed: (P) Patient not medically ready Pt with SoB, awaiting Respiratory, nursing requests PT evaluation later. PT will return this afternoon as able.  Dorethea Strubel B. Beverely RisenVan Mcdonald PT, DPT Acute Rehabilitation  [phone] Pager [phone]     [name] 03/08/2017, 10:39 AM

## 2017-03-08 NOTE — Progress Notes (Signed)
Assumed care from off going RN @ 0710; patient resting during this time; assessed patient @ 0800; [facility] having some SOB; patient now up in chair still use of accessory muscles with O2 @ 3L with SATs 100%. Will continue to monitor patient throughout shift

## 2017-03-08 NOTE — Progress Notes (Signed)
PROGRESS NOTE    MYSTIC LABO  ZOX:[id number] DOB: 1935-03-28 DOA: 03/07/2017 PCP: [name], MD  Brief Narrative:  [name] is a pleasant 82 y.o. female with medical history significant for hypoxic respiratory failure on home oxygen related to COPD, diastolic dysfunction, hypertension, CAD, anemia, anxiety presents to the emergency department with the chief complaint of worsening shortness of breath. Initial evaluation reveals acute on chronic respiratory failure likely related to COPD exacerbation. She stated that over the last week she's experience worsening shortness of breath. She is also had intermittent productive cough and mild increase in lower extremity edema. She denies chest pain palpitations headache dizziness syncope or near-syncope. She denies any abdominal pain nausea vomiting diarrhea constipation melena or bright red blood per rectum. She saw her neurologist 3 weeks ago she was placed on prednisone which she's completed. She also reports she saw a cardiologist around the same time increase her Lasix from 20-40 mg. She was admitted for Acute on Chronic Hypoxic Respiratory Failure from COPD Exacerbation.   Assessment & Plan:   Principal Problem:   Acute on chronic respiratory failure (HCC) Active Problems:   HTN (hypertension)   CAD (coronary artery disease), native coronary artery   HLD (hyperlipidemia)   Hypertrophic cardiomyopathy (HCC)   Anxiety   Iron deficiency anemia   Tachycardia   Diastolic dysfunction   COPD with acute exacerbation (HCC)   Hyperglycemia  1. Acute on Chronic respiratory failure requiring NIPPV likely related to COPD with acute exacerbation. Now weaned to [facility] and 3 Liters  -Patient is on home oxygen at 2-3 L 24/ 7.  -Initial ABG reveals pH 7.38, PCO2 52.7, PO2 167. Repeat was 7.40, PCO2 was 47.6, and PO2 was 112.0.  -Chest x-ray reveals hyperinflation with emphysematous changes and scarring in the right lung. No infiltrate.  -EKG was  tachycardic at 101 with atrial premature complex right atrial enlargement.  -Time of admission she was on BiPAP with moderate increased work of breathing and oxygen saturation level 88% on 3 L nasal cannula -Admitted to step down -Scheduled Ipratropium 0.5 mg q6h with Levalbuterol 0.63 q6h and Albuterol 2.5 mg Neb q2hprn wheezing -C/w IV Methylprednisolone 60 mg q6h -Levaquin 500 mg po  -Monitor respiratory effort and oxygen saturation level and put back on BiPAP as indicated -Added Flutter Valve and increased Guaifenesin.   2. COPD with acute exacerbation.  -Chart review indicates patient has had worsening shortness of breath over the last couple of weeks.  -Saw her pulmonology M.D. 3 weeks ago. Office note indicates she was given a course of prednisone, antibiotics were deferred at that time.  -Office note also indicates considering changing to nebulized LAMA, LABA and ICS. In addition office note indicates patient may benefit from palliative care assessment -Nebulizers as noted above -Continue Dulera substitution  -C/w Magnesium Gluconate 500 mg po Daily -Solu-Medrol as above -Palliative care consulted for Goals of Care and Psychospiritual Support; C/w Aggressive Care  3. Tachycardia/CAD.  -Likely related to nebs. Mild. -Initial troponin negative.  -Patient denies chest pain.  -Review indicates recent Myoview low risk -Monitor on telemetry -Continue home meds  4. Hyperglycemia.  Likely steroid-induced. Serum glucose 187 on admission.  -Hemoglobin A1c was 5.6 -C/w Sensitive Novolog Sliding scale insulin AC  5. Hypertension.  -BP, low end of normal in the emergency department.  -Home medications include Bystolic, Lasix, Cozaar -Hold Lasix for now -Continue home meds -Continue to Monitor  6. Diastolic dysfunction/hypertrophic cardiomyopathy.  -Appears stable at baseline  -Chart  review indicates office visit with cardiology 2 weeks ago. Echo in March 2017 showed vigorous  LVEF with an EF of 65% mild grade 1 diastolic dysfunction. -Daily weights -Monitor intake and output -Held Lasix  -Monitor  7. Acute kidney Injury.  -Creatinine 1.26 on Admission. Likely related decreased oral intake in the setting of Lasix and Cozaar. -Gentle IV fluids -Hold Lasix for now -BUN/Cr improved to 31/0.94 -Repeat CMP in AM  8. Iron deficiency anemia.  -Hemoglobin 11.4 on admission. Hb/Hct Dropped to 9.7/29.5. ?Dilutional Drop -Continue home meds  9. Anxiety. -Patient appears slightly anxious. -Continue home meds  10. Leukocytosis  -Mild -Likely from IV Steroid Demargination -Continue to Monitor for S/Sx of Infection  DVT prophylaxis: Enoxaparin 30 mg sq q24 Code Status: FULL CODE Family Communication: Discussed with Husband at bedside Disposition Plan: Remain in SDU and transfer to floor if Stable in AM not requiring BiPAP  Consultants:   Palliative Care Medicine   Procedures: None   Antimicrobials: Anti-infectives    Start     Dose/Rate Route Frequency Ordered Stop   03/07/17 0800  levofloxacin (LEVAQUIN) tablet 500 mg     500 mg Oral Every 48 hours 03/07/17 0707       Subjective: Seen and examined and was unable to cough up sputum. States she is still SOB but slightly improved. No nausea or vomiting. Feels better since coming in.   Objective: Vitals:   03/07/17 1738 03/07/17 1933 03/07/17 2346 03/08/17 0416  BP: 105/62 137/71 (!) 114/53 111/61  Pulse: (!) 101 94 84 81  Resp: (!) 21 19 20 17   Temp: 98.2 F (36.8 C) 98.4 F (36.9 C) 98.1 F (36.7 C) 98.1 F (36.7 C)  TempSrc: Oral Oral Oral Oral  SpO2: 98% 99% 100% 100%  Weight:    57.9 kg (127 lb 11.2 oz)  Height:        Intake/Output Summary (Last 24 hours) at 03/08/17 2703 Last data filed at 03/08/17 0500  Gross per 24 hour  Intake              360 ml  Output              700 ml  Net             -340 ml   Filed Weights   03/07/17 0346 03/08/17 0416  Weight: 56.2 kg (124 lb)  57.9 kg (127 lb 11.2 oz)   Examination: Physical Exam:  Constitutional: Thin Caucasian female in NAD and appears calm Eyes: Lids and conjunctivae normal, sclerae anicteric  ENMT: External Ears, Nose appear normal. Grossly normal hearing. Mucous membranes are moist. Neck: Appears normal, supple, no cervical masses, normal ROM, no appreciable thyromegaly, no JVD Respiratory: Diminished to auscultation bilaterally with wheezing and rhonchi. Slightly increased respiratory effort. No appreciable accessory muscle use.  Cardiovascular: RRR, no murmurs / rubs / gallops. S1 and S2 auscultated. No extremity edema.  Abdomen: Soft, non-tender, non-distended. No masses palpated. No appreciable hepatosplenomegaly. Bowel sounds positive.  GU: Deferred. Musculoskeletal: No clubbing / cyanosis of digits/nails. No joint deformity upper and lower extremities. Good ROM, no contractures.   Skin: No rashes, lesions, ulcers on a limited skin eval. No induration; Warm and dry.  Neurologic: CN 2-12 grossly intact with no focal deficits. Romberg sign cerebellar reflexes not assessed.  Psychiatric: Normal judgment and insight. Alert and oriented x 3. Normal mood and appropriate affect.   Data Reviewed: I have personally reviewed following labs and imaging studies  CBC:  Recent Labs Lab 03/07/17 0335 03/08/17 0357  WBC 10.2 11.7*  NEUTROABS 5.4  --   HGB 11.4* 9.7*  HCT 35.0* 29.5*  MCV 94.3 93.4  PLT 375 332   Basic Metabolic Panel:  Recent Labs Lab 03/07/17 0335 03/08/17 0357  NA 139 136  K 4.2 4.7  CL 99* 97*  CO2 27 29  GLUCOSE 187* 144*  BUN 32* 31*  CREATININE 1.26* 0.94  CALCIUM 9.2 8.9   GFR: Estimated Creatinine Clearance: 39.7 mL/min (by C-G formula based on SCr of 0.94 mg/dL). Liver Function Tests: No results for input(s): AST, ALT, ALKPHOS, BILITOT, PROT, ALBUMIN in the last 168 hours. No results for input(s): LIPASE, AMYLASE in the last 168 hours. No results for input(s):  AMMONIA in the last 168 hours. Coagulation Profile: No results for input(s): INR, PROTIME in the last 168 hours. Cardiac Enzymes:  Recent Labs Lab 03/07/17 0335  TROPONINI <0.03   BNP (last 3 results)  Recent Labs  07/15/16 1542 01/26/17 1137  PROBNP 90 91   HbA1C:  Recent Labs  03/07/17 0731  HGBA1C 5.6   CBG:  Recent Labs Lab 03/07/17 0805 03/07/17 1239 03/07/17 1659 03/07/17 2142 03/08/17 0605  GLUCAP 221* 133* 225* 134* 133*   Lipid Profile: No results for input(s): CHOL, HDL, LDLCALC, TRIG, CHOLHDL, LDLDIRECT in the last 72 hours. Thyroid Function Tests: No results for input(s): TSH, T4TOTAL, FREET4, T3FREE, THYROIDAB in the last 72 hours. Anemia Panel: No results for input(s): VITAMINB12, FOLATE, FERRITIN, TIBC, IRON, RETICCTPCT in the last 72 hours. Sepsis Labs: No results for input(s): PROCALCITON, LATICACIDVEN in the last 168 hours.  No results found for this or any previous visit (from the past 240 hour(s)).   Radiology Studies: Dg Chest Portable 1 View  Result Date: 03/07/2017 CLINICAL DATA:  Shortness of breath EXAM: PORTABLE CHEST 1 VIEW COMPARISON:  02/08/2017 FINDINGS: Hyperinflation. Right lower probable nipple shadow. No acute consolidation or effusion. Stable cardiomediastinal silhouette with atherosclerosis. Emphysematous changes. Scarring in the right upper lobe. No pneumothorax. IMPRESSION: Hyperinflation with emphysematous changes and scarring in the right lung a PACs. No acute infiltrate or edema Electronically Signed   By: Jasmine Pang M.D.   On: 03/07/2017 04:20   Scheduled Meds: . aspirin  81 mg Oral Daily  . atorvastatin  20 mg Oral Daily  . calcium-vitamin D  1 tablet Oral Q breakfast  . enoxaparin (LOVENOX) injection  30 mg Subcutaneous Q24H  . furosemide  40 mg Oral BID  . guaiFENesin  600 mg Oral BID  . insulin aspart  0-9 Units Subcutaneous TID WC  . ipratropium  0.5 mg Nebulization Q6H  . levofloxacin  500 mg Oral Q48H  .  losartan  50 mg Oral Daily  . magnesium gluconate  500 mg Oral Daily  . methylPREDNISolone (SOLU-MEDROL) injection  60 mg Intravenous Q6H  . mometasone-formoterol  2 puff Inhalation BID  . nebivolol  5 mg Oral Daily   Continuous Infusions:   LOS: 1 day   Merlene Laughter, DO [facility] Pager [phone]  If 7PM-7AM, please contact night-coverage [email or web address] Password [email or web address] 03/08/2017, 7:22 AM

## 2017-03-08 NOTE — Evaluation (Signed)
Occupational Therapy Evaluation Patient Details Name: [name] MRN: [id number] DOB: 1934/10/05 Today's Date: 03/08/2017    History of Present Illness 82 y.o. female admitted with complaints of increasing shortness of breath Initial evaluation reveals acute on chronic respiratory failure likely related to COPD exacerbation. Prior medical history significant for hypoxic respiratory failure on home oxygen related to COPD, diastolic dysfunction, hypertension, CAD, anemia, anxiety    Clinical Impression   Pt admitted with above. She demonstrates the below listed deficits and will benefit from continued OT to maximize safety and independence with BADLs.  Pt presents to OT with decreased activity tolerance, generalized weakness.  She required mod A for ADLs due to DOE 4/4 with minimal activity.  She reports she was able to complete ADLs with multiple rest breaks and increased time PTA.   She lives with spouse who is supportive.   Will follow.       Follow Up Recommendations  Other (comment) (pulmonary rehab )    Equipment Recommendations  None recommended by OT    Recommendations for Other Services       Precautions / Restrictions Precautions Precautions: Fall      Mobility Bed Mobility               General bed mobility comments: in recliner   Transfers Overall transfer level: Needs assistance Equipment used: Rolling walker (2 wheeled) Transfers: Sit to/from [employer] Sit to Stand: Min guard Stand pivot transfers: Min guard            Balance Overall balance assessment: Needs assistance Sitting-balance support: Feet unsupported;No upper extremity supported Sitting balance-Leahy Scale: Good     Standing balance support: No upper extremity supported Standing balance-Leahy Scale: Fair                             ADL either performed or assessed with clinical judgement   ADL Overall ADL's : Needs  assistance/impaired Eating/Feeding: Independent   Grooming: Wash/dry hands;Wash/dry face;Oral care;Brushing hair;Sitting   Upper Body Bathing: Minimal assistance;Sitting   Lower Body Bathing: Moderate assistance;Sit to/from stand   Upper Body Dressing : Minimal assistance;Sitting   Lower Body Dressing: Maximal assistance;Sit to/from stand Lower Body Dressing Details (indicate cue type and reason): due to DOE  Toilet Transfer: Min guard;Stand-pivot;BSC;RW   Toileting- Clothing Manipulation and Hygiene: Moderate assistance;Sit to/from stand Toileting - Clothing Manipulation Details (indicate cue type and reason): due to DOE      Functional mobility during ADLs: Min guard;Rolling walker General ADL Comments: Pt fatigues rapidly with minimal activity and requires assist to complete tasks. DOE 4/4      Vision         Perception     Praxis      Pertinent Vitals/Pain Pain Assessment: No/denies pain     Hand Dominance     Extremity/Trunk Assessment Upper Extremity Assessment Upper Extremity Assessment: Overall WFL for tasks assessed   Lower Extremity Assessment Lower Extremity Assessment: Defer to PT evaluation   Cervical / Trunk Assessment Cervical / Trunk Assessment: Kyphotic   Communication Communication Communication: No difficulties   Cognition Arousal/Alertness: Awake/alert Behavior During Therapy: WFL for tasks assessed/performed Overall Cognitive Status: Within Functional Limits for tasks assessed                                     General Comments  Pt  on 3L supplemental 02.  02 sats dropped to 85% with activity, but quickly rebounded to mid 90s with pursed lip breathing and rest.  DOE 4/4.  began discussion of energy conservation techniques.  Pt currently employs many of these     Exercises     Shoulder Instructions      Home Living Family/patient expects to be discharged to:: Private residence Living Arrangements: Spouse/significant  other Available Help at Discharge: Family;Available 24 hours/day Type of Home: House Home Access: Stairs to enter [employer] CorporationEntrance Stairs-Number of Steps: 3 Entrance Stairs-Rails: Left Home Layout: One level     Bathroom Shower/Tub: Producer, television/film/videoWalk-in shower   Bathroom Toilet: Handicapped height Bathroom Accessibility: Yes   Home Equipment: Shower seat;Shower seat - built in;Walker - 2 wheels;Grab bars - tub/shower;Hand held shower head;Bedside commode;Wheelchair - power;Electric scooter   Additional Comments: home 02      Prior Functioning/Environment Level of Independence: Needs assistance;Independent with assistive device(s)  Gait / Transfers Assistance Needed: Pt reports she uses power w/c predominantly in the home for mobility. Will ambulate short distances with RW ADL's / Homemaking Assistance Needed: Pt reports she gets bathed and dressed in am, but it may take her 2+ hours and then she is exhausted for rest of the day    Comments: uses walker and power chair depending how she feels, bathes and dresses herself with difficulty        OT Problem List: Decreased strength;Decreased activity tolerance;Impaired balance (sitting and/or standing);Cardiopulmonary status limiting activity      OT Treatment/Interventions: Self-care/ADL training;Therapeutic exercise;Therapeutic activities;Patient/family education;Balance training;DME and/or AE instruction;Energy conservation    OT Goals(Current goals can be found in the care plan section) Acute Rehab OT Goals Patient Stated Goal: to get back to her normal  OT Goal Formulation: With patient Time For Goal Achievement: 03/22/17 Potential to Achieve Goals: Good ADL Goals Additional ADL Goal #1: Pt will independently incorporate energy conservation techniques into daily activities Additional ADL Goal #2: Pt will complete simple ADL task with no more than 3 rest breaks and DOE 3/4   OT Frequency: Min 2X/week   Barriers to D/C:             Co-evaluation              AM-PAC PT "6 Clicks" Daily Activity     Outcome Measure Help from another person eating meals?: None Help from another person taking care of personal grooming?: A Little Help from another person toileting, which includes using toliet, bedpan, or urinal?: A Lot Help from another person bathing (including washing, rinsing, drying)?: A Lot Help from another person to put on and taking off regular upper body clothing?: A Little Help from another person to put on and taking off regular lower body clothing?: A Lot 6 Click Score: 16   End of Session Equipment Utilized During Treatment: Rolling walker;Oxygen Nurse Communication: Mobility status  Activity Tolerance: Patient limited by fatigue Patient left: in chair;with call bell/phone within reach  OT Visit Diagnosis: Unsteadiness on feet (R26.81)                Time: [id number] OT Time Calculation (min): 29 min Charges:  OT General Charges $OT Visit: 1 Visit OT Evaluation $OT Eval Moderate Complexity: 1 Mod OT Treatments $Self Care/Home Management : 8-22 mins G-Codes:     Reynolds AmericanWendi Merleen Picazo, OTR/L [phone]   Jessamy Torosyan M 03/08/2017, 5:18 PM

## 2017-03-08 NOTE — Progress Notes (Signed)
Initial Nutrition Assessment  INTERVENTION:   Educated pt and husband on importance of including protein sources at meals and snacks.  Encouraged pt to use [employer] Instant Breakfast as needed  NUTRITION DIAGNOSIS:   Increased nutrient needs related to  (COPD) as evidenced by estimated needs.  GOAL:   Patient will meet greater than or equal to 90% of their needs  MONITOR:   PO intake, I & O's  REASON FOR ASSESSMENT:   Consult COPD Protocol  ASSESSMENT:   Pt with PMH of HTN, diastolic dysfunction, anemia, anxiety, and COPD on home O2 2-3 L 24/7admitted with acute respiratory failure secondary to COPD exacerbation.     Spoke with pt who reports no recent weight changes and good appetite PTA. 24 hr recall reviewed. Breakfast: [employer] Instant Breakfast vs fruit and milk vs sausage and biscuit Lunch: tomato or cheese sandwich with chips and tea Dinner: meat with vegetables or vegetable plate Discussed with pt and husband that pt's diet may be lacking in protein on days where she eats lower protein foods. Discussed alternatives.  She does have problems chewing tough meats due to decreased muscle strength in her jaw.   Pt has limited mobility due to SOB. She ambulates to chair and bathroom but this is most of what she is able to do at home. She reports having ankle edema most of the time. She does not usually have her feet up as she does not like her recliner.  Her husband does the cooking, shopping, and cleaning.   Nutrition-Focused physical exam completed. Findings are no fat depletion, mild/moderate calf muscle depletion (likely from limited mobility), and no edema.    Medications reviewed and include: lasix, SSI, oscal with D, solumedrol  CBG's: [phone] Lab Results  Component Value Date   HGBA1C 5.6 03/07/2017    Diet Order:  Diet heart healthy/carb modified Room service appropriate? Yes; Fluid consistency: Thin  Skin:  Reviewed, no issues  Last BM:   8/29  Height:   Ht Readings from Last 1 Encounters:  03/07/17 5\' 1"  (1.549 m)    Weight:   Wt Readings from Last 1 Encounters:  03/08/17 127 lb 11.2 oz (57.9 kg)    Ideal Body Weight:  47.7 kg  BMI:  Body mass index is 24.13 kg/m.  Estimated Nutritional Needs:   Kcal:  1400-1600  Protein:  70-85 grams  Fluid:  > 1.5 L/day  EDUCATION NEEDS:   Education needs addressed  Kendell BaneHeather Lamira Borin RD, LDN, CNSC [phone] Pager [phone] After Hours Pager

## 2017-03-08 NOTE — Evaluation (Signed)
Physical Therapy Evaluation Patient Details Name: [name] MRN: [id number] DOB: 07-Jul-1935 Today's Date: 03/08/2017   History of Present Illness  82 y.o. female admitted with complaints of increasing shortness of breath Initial evaluation reveals acute on chronic respiratory failure likely related to COPD exacerbation. Prior medical history significant for hypoxic respiratory failure on home oxygen related to COPD, diastolic dysfunction, hypertension, CAD, anemia, anxiety   Clinical Impression  Pt admitted with above diagnosis. Pt currently with functional limitations due to the deficits listed below (see PT Problem List). Pt mobility currently limited by dyspnea and oxygen desaturation. Pt currently requires supervision for transfers and min guard for ambulation of 20 feet with RW. Pt will benefit from skilled PT to increase their independence and safety with mobility to allow discharge to the venue listed below.       Follow Up Recommendations Home health PT;Supervision for mobility/OOB    Equipment Recommendations  None recommended by PT       Precautions / Restrictions Restrictions Weight Bearing Restrictions: No      Mobility  Bed Mobility               General bed mobility comments: in recliner at entry  Transfers Overall transfer level: Needs assistance Equipment used: Rolling walker (2 wheeled) Transfers: Sit to/from Stand Sit to Stand: Min guard         General transfer comment: min guard for safety, good power up and steadying in standing,   Ambulation/Gait Ambulation/Gait assistance: Min guard Ambulation Distance (Feet): 20 Feet Assistive device: Rolling walker (2 wheeled) Gait Pattern/deviations: Step-through pattern;Decreased step length - right;Decreased step length - left;Shuffle;Trunk flexed Gait velocity: slowed Gait velocity interpretation: Below normal speed for age/gender General Gait Details: min guard for safety, vc for staying inside  walker and for upright posture      Balance Overall balance assessment: Needs assistance Sitting-balance support: Feet unsupported;No upper extremity supported Sitting balance-Leahy Scale: Good     Standing balance support: No upper extremity supported Standing balance-Leahy Scale: Fair Standing balance comment: able to steady herself in standing before reaching to RW                             Pertinent Vitals/Pain Pain Assessment: No/denies pain    Home Living Family/patient expects to be discharged to:: Private residence Living Arrangements: Spouse/significant other Available Help at Discharge: Family;Available 24 hours/day Type of Home: House Home Access: Stairs to enter Entrance Stairs-Rails: Left Entrance Stairs-Number of Steps: 3 Home Layout: One level Home Equipment: Shower seat;Shower seat - built in;Walker - 2 wheels;Grab bars - tub/shower;Hand held shower head;Bedside commode;Wheelchair - power;Electric scooter      Prior Function Level of Independence: Independent with assistive device(s)         Comments: uses walker and power chair depending how she feels, bathes and dresses herself with difficulty        Extremity/Trunk Assessment   Upper Extremity Assessment Upper Extremity Assessment: Overall WFL for tasks assessed    Lower Extremity Assessment Lower Extremity Assessment: Overall WFL for tasks assessed    Cervical / Trunk Assessment Cervical / Trunk Assessment: Kyphotic  Communication   Communication: No difficulties  Cognition Arousal/Alertness: Awake/alert Behavior During Therapy: WFL for tasks assessed/performed Overall Cognitive Status: Within Functional Limits for tasks assessed  General Comments General comments (skin integrity, edema, etc.): Pt on 3 L O2 via nasal cannula, SaO2 at rest 98%O2, with ambulation pt experienced 3/4 DoE and SaO2 dropped to 85%, with  standing rest break and vc for pursed lipped breathig SaO2 rebounded to 90%O2, all other VSS        Assessment/Plan    PT Assessment Patient needs continued PT services  PT Problem List Decreased strength;Decreased activity tolerance;Decreased mobility;Decreased knowledge of use of DME;Decreased safety awareness;Cardiopulmonary status limiting activity       PT Treatment Interventions DME instruction;Gait training;Stair training;Functional mobility training;Therapeutic activities;Therapeutic exercise;Balance training;Patient/family education    PT Goals (Current goals can be found in the Care Plan section)  Acute Rehab PT Goals Patient Stated Goal: breathe easier PT Goal Formulation: With patient Time For Goal Achievement: 03/15/17 Potential to Achieve Goals: Good    Frequency Min 3X/week    AM-PAC PT "6 Clicks" Daily Activity  Outcome Measure Difficulty turning over in bed (including adjusting bedclothes, sheets and blankets)?: A Lot Difficulty moving from lying on back to sitting on the side of the bed? : A Lot Difficulty sitting down on and standing up from a chair with arms (e.g., wheelchair, bedside commode, etc,.)?: A Little Help needed moving to and from a bed to chair (including a wheelchair)?: None Help needed walking in hospital room?: A Little Help needed climbing 3-5 steps with a railing? : A Lot 6 Click Score: 16    End of Session Equipment Utilized During Treatment: Gait belt;Oxygen Activity Tolerance: Patient limited by fatigue Patient left: in chair;with call bell/phone within reach;with family/visitor present Nurse Communication: Mobility status;Other (comment) (pt requesting nebulizer treatment) PT Visit Diagnosis: Other abnormalities of gait and mobility (R26.89);Muscle weakness (generalized) (M62.81);Difficulty in walking, not elsewhere classified (R26.2)    Time: 1338-1400 PT Time Calculation (min) (ACUTE ONLY): 22 min   Charges:   PT Evaluation $PT  Eval Moderate Complexity: 1 Mod     PT G Codes:        Genie Mirabal B. Beverely Risen PT, DPT Acute Rehabilitation  [phone] Pager [phone]    Elon Alas Fleet 03/08/2017, 2:46 PM

## 2017-03-09 ENCOUNTER — Ambulatory Visit (HOSPITAL_COMMUNITY): Payer: PPO

## 2017-03-09 ENCOUNTER — Inpatient Hospital Stay (HOSPITAL_COMMUNITY): Payer: PPO

## 2017-03-09 DIAGNOSIS — I340 Nonrheumatic mitral (valve) insufficiency: Secondary | ICD-10-CM

## 2017-03-09 DIAGNOSIS — R0602 Shortness of breath: Secondary | ICD-10-CM

## 2017-03-09 LAB — CBC WITH DIFFERENTIAL/PLATELET
Basophils Absolute: 0 10*3/uL (ref 0.0–0.1)
Basophils Relative: 0 %
EOS ABS: 0 10*3/uL (ref 0.0–0.7)
EOS PCT: 0 %
HCT: 30.4 % — ABNORMAL LOW (ref 36.0–46.0)
HEMOGLOBIN: 9.7 g/dL — AB (ref 12.0–15.0)
LYMPHS ABS: 0.8 10*3/uL (ref 0.7–4.0)
Lymphocytes Relative: 5 %
MCH: 30 pg (ref 26.0–34.0)
MCHC: 31.9 g/dL (ref 30.0–36.0)
MCV: 94.1 fL (ref 78.0–100.0)
MONO ABS: 0.7 10*3/uL (ref 0.1–1.0)
MONOS PCT: 4 %
NEUTROS PCT: 91 %
Neutro Abs: 13.4 10*3/uL — ABNORMAL HIGH (ref 1.7–7.7)
Platelets: 346 10*3/uL (ref 150–400)
RBC: 3.23 MIL/uL — ABNORMAL LOW (ref 3.87–5.11)
RDW: 12.9 % (ref 11.5–15.5)
WBC: 14.8 10*3/uL — ABNORMAL HIGH (ref 4.0–10.5)

## 2017-03-09 LAB — ECHOCARDIOGRAM COMPLETE
Area-P 1/2: 2.59 cm2
E decel time: 289 msec
EERAT: 8.92
FS: 25 % — AB (ref 28–44)
HEIGHTINCHES: 61 in
IVS/LV PW RATIO, ED: 1.34
LA ID, A-P, ES: 31 mm
LA diam end sys: 31 mm
LA vol index: 22.3 mL/m2
LADIAMINDEX: 1.98 cm/m2
LAVOL: 35 mL
LAVOLA4C: 32.9 mL
LV TDI E'MEDIAL: 6.09
LV e' LATERAL: 8.7 cm/s
LVEEAVG: 8.92
LVEEMED: 8.92
LVOT area: 2.54 cm2
LVOTD: 18 mm
Lateral S' vel: 12.4 cm/s
MV Dec: 289
MV Peak grad: 2 mmHg
MVPKAVEL: 105 m/s
MVPKEVEL: 77.6 m/s
MVSPHT: 85 ms
PW: 8.89 mm — AB (ref 0.6–1.1)
RV TAPSE: 15.5 mm
TDI e' lateral: 8.7
WEIGHTICAEL: 1995.2 [oz_av]

## 2017-03-09 LAB — COMPREHENSIVE METABOLIC PANEL
ALK PHOS: 35 U/L — AB (ref 38–126)
ALT: 21 U/L (ref 14–54)
ANION GAP: 8 (ref 5–15)
AST: 25 U/L (ref 15–41)
Albumin: 3.4 g/dL — ABNORMAL LOW (ref 3.5–5.0)
BUN: 34 mg/dL — ABNORMAL HIGH (ref 6–20)
CHLORIDE: 100 mmol/L — AB (ref 101–111)
CO2: 32 mmol/L (ref 22–32)
Calcium: 8.3 mg/dL — ABNORMAL LOW (ref 8.9–10.3)
Creatinine, Ser: 1.03 mg/dL — ABNORMAL HIGH (ref 0.44–1.00)
GFR calc non Af Amer: 50 mL/min — ABNORMAL LOW (ref >60)
GFR, EST AFRICAN AMERICAN: 58 mL/min — AB (ref >60)
GLUCOSE: 140 mg/dL — AB (ref 65–99)
POTASSIUM: 4.2 mmol/L (ref 3.5–5.1)
SODIUM: 140 mmol/L (ref 135–145)
Total Bilirubin: 0.5 mg/dL (ref 0.3–1.2)
Total Protein: 5.9 g/dL — ABNORMAL LOW (ref 6.5–8.1)

## 2017-03-09 LAB — GLUCOSE, CAPILLARY
GLUCOSE-CAPILLARY: 149 mg/dL — AB (ref 65–99)
GLUCOSE-CAPILLARY: 145 mg/dL — AB (ref 65–99)
GLUCOSE-CAPILLARY: 104 mg/dL — AB (ref 65–99)
Glucose-Capillary: 146 mg/dL — ABNORMAL HIGH (ref 65–99)

## 2017-03-09 LAB — MAGNESIUM: Magnesium: 2.4 mg/dL (ref 1.7–2.4)

## 2017-03-09 LAB — PHOSPHORUS: Phosphorus: 4.1 mg/dL (ref 2.5–4.6)

## 2017-03-09 MED ORDER — DEXTROSE 5 % IV SOLN
100.0000 mg | Freq: Two times a day (BID) | INTRAVENOUS | Status: DC
  Administered 2017-03-09 (×2): 100 mg via INTRAVENOUS
  Filled 2017-03-09 (×3): qty 100

## 2017-03-09 MED ORDER — SODIUM CHLORIDE 0.9 % IV SOLN: INTRAVENOUS | Status: DC

## 2017-03-09 MED ORDER — POLYETHYLENE GLYCOL 3350 17 G PO PACK
17.0000 g | PACK | Freq: Two times a day (BID) | ORAL | Status: DC
  Administered 2017-03-09 – 2017-03-12 (×5): 17 g via ORAL
  Filled 2017-03-09 (×10): qty 1

## 2017-03-09 NOTE — Progress Notes (Signed)
Patient did not have BIPAP on when RT entered room. Patient stated she feels comfortable without it and does not wish to wear it at this time. RT informed patient to have RT called if she changes her mind. RT will monitor as needed.

## 2017-03-09 NOTE — Progress Notes (Signed)
RT contacted about breathing treatment. Upon entering room, patient SOB and wheezing. No relief after treatment. RT set up and placed patient on BiPAP. 14/6 60%. Patient tolerating well at this time. RT will continue to monitor.

## 2017-03-09 NOTE — Progress Notes (Signed)
Pt having c/o SOB and dyspnea at rest. Respiratory contacted and neb treatment given with no improvement. Pt now placed on BIPAP. Will continue to monitor. Gregor HamsAlisha Aqil Goetting, RN

## 2017-03-09 NOTE — Care Management Note (Signed)
Case Management Note Donn PieriniKristi Shernita Rabinovich RN, BSN Unit 4E-Case Manager [phone]  Patient Details  Name: [name] MRN: [id number] Date of Birth: 1934/10/20  Subjective/Objective:  Pt admitted with COPD                Action/Plan: PTA pt lived at home with spouse- has home 7902- PCP-  Holwerda - CM to follow for d/c needs- referral for COPD GOLD pt does not have greater than 3 admissions in last 6 mo.   Expected Discharge Date:                  Expected Discharge Plan:  Home w Home Health Services  In-House Referral:  Clinical Social Work  Discharge planning Services  CM Consult  Post Acute Care Choice:    Choice offered to:     DME Arranged:    DME Agency:     HH Arranged:    HH Agency:     Status of Service:  In process, will continue to follow  If discussed at Long Length of Stay Meetings, dates discussed:    Discharge Disposition:   Additional Comments:  Darrold SpanWebster, Rena Sweeden Hall, RN 03/09/2017, 10:18 AM

## 2017-03-09 NOTE — Progress Notes (Signed)
  Echocardiogram 2D Echocardiogram has been performed.  [name] 03/09/2017, 3:21 PM

## 2017-03-09 NOTE — Progress Notes (Signed)
PROGRESS NOTE    Sheri SleeperHazel H Servais  NGE:[id number] DOB: [birth year] DOA: 03/07/2017 PCP: Alysia PennaHolwerda, Scott, MD  Brief Narrative:  [name] is a pleasant 82 y.o. female with medical history significant for hypoxic respiratory failure on home oxygen related to COPD, diastolic dysfunction, hypertension, CAD, anemia, anxiety presents to the emergency department with the chief complaint of worsening shortness of breath. Initial evaluation reveals acute on chronic respiratory failure likely related to COPD exacerbation. She stated that over the last week she's experience worsening shortness of breath. She is also had intermittent productive cough and mild increase in lower extremity edema. She denies chest pain palpitations headache dizziness syncope or near-syncope. She denies any abdominal pain nausea vomiting diarrhea constipation melena or bright red blood per rectum. She saw her neurologist 3 weeks ago she was placed on prednisone which she's completed. She also reports she saw a cardiologist around the same time increase her Lasix from 20-40 mg. She was admitted for Acute on Chronic Hypoxic Respiratory Failure from COPD Exacerbation.   Assessment & Plan:   Principal Problem:   Acute on chronic respiratory failure (HCC) Active Problems:   HTN (hypertension)   CAD (coronary artery disease), native coronary artery   HLD (hyperlipidemia)   Hypertrophic cardiomyopathy (HCC)   Anxiety   Iron deficiency anemia   Tachycardia   Diastolic dysfunction   COPD with acute exacerbation (HCC)   Hyperglycemia   Goals of care, counseling/discussion   Palliative care by specialist  1. Acute on Chronic respiratory failure requiring NIPPV likely related to COPD with acute exacerbation. [facility] and [facility] Liters  -Patient is on home oxygen at 2-3 L 24/ 7.  -Initial ABG reveals pH 7.38, PCO2 52.7, PO2 167. Repeat was 7.40, PCO2 was 47.6, and PO2 was 112.0.  -Chest x-ray reveals hyperinflation with  emphysematous changes and scarring in the right lung. No infiltrate.  -EKG was tachycardic at 101 with atrial premature complex right atrial enlargement.  -Time of admission she was on BiPAP with moderate increased work of breathing and oxygen saturation level 88% on 3 L nasal cannula; C/w BiPAP qHS and Prn -Admitted to step down -Scheduled Ipratropium 0.5 mg q6h with Levalbuterol 0.63 q6h and Albuterol 2.5 mg Neb q2hprn wheezing -C/w IV Methylprednisolone 60 mg q6h -Levaquin 500 mg po changed to IV Doxycyxline -Monitor respiratory effort and oxygen saturation level and put back on BiPAP as indicated -Added Flutter Valve and increased Guaifenesin.  -Check ECHOcardiogram  2. COPD with acute exacerbation.  -Chart review indicates patient has had worsening shortness of breath over the last couple of weeks.  -Saw her pulmonology M.D. 3 weeks ago. Office note indicates she was given a course of prednisone, antibiotics were deferred at that time.  -Office note also indicates considering changing to nebulized LAMA, LABA and ICS. In addition office note indicates patient may benefit from palliative care assessment -Nebulizers as noted above -Continue Dulera substitution  -C/w Magnesium Gluconate 500 mg po Daily -Solu-Medrol as above -Check Respiratory Virus Panel and Check ECHO -If not improving consider obtaining CTA of Chest to r/o PE and consider Pulmonary Consultation.  -Repeat CXR showed COPD  -Palliative care consulted for Goals of Care and Psychospiritual Support; C/w Aggressive Care  3. Tachycardia/CAD.  -Likely related to nebs. Mild. -Initial troponin negative.  -Patient denies chest pain.  -Review indicates recent Myoview low risk -Monitor on telemetry -Continue home meds  4. Hyperglycemia.  -Likely steroid-induced. CBG's ranging from 104-146  -Hemoglobin A1c was 5.6 -  C/w Sensitive Novolog Sliding scale insulin AC  5. Hypertension.  -BP, low end of normal in the emergency  department.  -Home medications include Bystolic, Lasix, Cozaar -Hold Lasix for now -Continue home meds -Continue to Monitor  6. Diastolic dysfunction/hypertrophic cardiomyopathy.  -Appears stable at baseline  -Chart review indicates office visit with cardiology 2 weeks ago. Echo in March 2017 showed vigorous LVEF with an EF of 65% mild grade 1 diastolic dysfunction. -Repeat ECHOCARDIOGRAM -Daily weights -Monitor intake and output -Held Lasix  -Monitor  7. Acute kidney Injury.  -Creatinine 1.26 on Admission. Likely related decreased oral intake in the setting of Lasix and Cozaar. -Gentle IV fluids -Hold Lasix for now -BUN/Cr went from 31/0.94 -> 34/1.03 -Repeat CMP in AM  8. Iron deficiency anemia.  -Hemoglobin 11.4 on admission. Hb/Hct Dropped to 9.7/30.4. ?Dilutional Drop -Continue home meds  9. Anxiety. -Patient appears slightly anxious. -Continue home meds  10. Leukocytosis  -Mild as WBC went from 11.7 -> 14.8 -Likely from IV Steroid Demargination -Continue to Monitor for S/Sx of Infection  DVT prophylaxis: Enoxaparin 30 mg sq q24 Code Status: FULL CODE Family Communication: Discussed with Husband at bedside Disposition Plan: Remain in SDU and transfer to floor if Stable in AM not requiring BiPAP  Consultants:   Palliative Care Medicine   Procedures:  ECHOCARDIOGRAM   Antimicrobials: Anti-infectives    Start     Dose/Rate Route Frequency Ordered Stop   03/09/17 1000  doxycycline (VIBRAMYCIN) 100 mg in dextrose 5 % 250 mL IVPB     100 mg 125 mL/hr over 120 Minutes Intravenous Every 12 hours 03/09/17 0922     03/07/17 0800  levofloxacin (LEVAQUIN) tablet 500 mg  Status:  Discontinued     500 mg Oral Every 48 hours 03/07/17 0707 03/09/17 6962     Subjective: Seen and examined and stated she wasn't doing as well breathing wise but was not wheezing. No nausea or vomiting but stated she didn't sleep last night.   Objective: Vitals:   03/09/17 1921  03/09/17 1922 03/09/17 1950 03/09/17 2059  BP:   (!) 156/87   Pulse:  96 95 (!) 105  Resp:  (!) 25 20 (!) 26  Temp:   98.3 F (36.8 C)   TempSrc:   Axillary   SpO2: 99% 96% 100% 100%  Weight:      Height:        Intake/Output Summary (Last 24 hours) at 03/09/17 2203 Last data filed at 03/09/17 1700  Gross per 24 hour  Intake              720 ml  Output              850 ml  Net             -130 ml   Filed Weights   03/07/17 0346 03/08/17 0416 03/09/17 0401  Weight: 56.2 kg (124 lb) 57.9 kg (127 lb 11.2 oz) 56.6 kg (124 lb 11.2 oz)   Examination: Physical Exam:  Constitutional: Pleasant thin Caucasian female in mild respiratory distress Eyes: Sclerae anicteric. Conjunctivae Non-injected ENMT: Grossly normal hearing. Mucous membranes appear moist Neck: Supple with no JVD Respiratory: Diminished with mild crackles but no appreciable wheezing. Patient slightly tachypenic with pursed lip breathing and wearing supplemental O2 via [place] Cardiovascular: Slightly tachycardic rate but regular rhythm. No LE Edema Abdomen: Soft, NT, ND.  GU: Deferred Musculoskeletal: No contractures; No cyanosis Skin: Warm and dry; No rashes or lesions on a limited skin  eval Neurologic: CN 2-12 grossly intact. No appreciable focal deficits Psychiatric: Pleasant mood and affect. Intact judgement and insight  Data Reviewed: I have personally reviewed following labs and imaging studies  CBC:  Recent Labs Lab 03/07/17 0335 03/08/17 0357 03/09/17 0315  WBC 10.2 11.7* 14.8*  NEUTROABS 5.4  --  13.4*  HGB 11.4* 9.7* 9.7*  HCT 35.0* 29.5* 30.4*  MCV 94.3 93.4 94.1  PLT 375 332 346   Basic Metabolic Panel:  Recent Labs Lab 03/07/17 0335 03/08/17 0357 03/09/17 0315  NA 139 136 140  K 4.2 4.7 4.2  CL 99* 97* 100*  CO2 27 29 32  GLUCOSE 187* 144* 140*  BUN 32* 31* 34*  CREATININE 1.26* 0.94 1.03*  CALCIUM 9.2 8.9 8.3*  MG  --   --  2.4  PHOS  --   --  4.1   GFR: Estimated Creatinine  Clearance: 33.4 mL/min (A) (by C-G formula based on SCr of 1.03 mg/dL (H)). Liver Function Tests:  Recent Labs Lab 03/09/17 0315  AST 25  ALT 21  ALKPHOS 35*  BILITOT 0.5  PROT 5.9*  ALBUMIN 3.4*   No results for input(s): LIPASE, AMYLASE in the last 168 hours. No results for input(s): AMMONIA in the last 168 hours. Coagulation Profile: No results for input(s): INR, PROTIME in the last 168 hours. Cardiac Enzymes:  Recent Labs Lab 03/07/17 0335  TROPONINI <0.03   BNP (last 3 results)  Recent Labs  07/15/16 1542 01/26/17 1137  PROBNP 90 91   HbA1C:  Recent Labs  03/07/17 0731  HGBA1C 5.6   CBG:  Recent Labs Lab 03/08/17 2114 03/09/17 0613 03/09/17 1138 03/09/17 1615 03/09/17 2128  GLUCAP 126* 149* 145* 104* 146*   Lipid Profile: No results for input(s): CHOL, HDL, LDLCALC, TRIG, CHOLHDL, LDLDIRECT in the last 72 hours. Thyroid Function Tests: No results for input(s): TSH, T4TOTAL, FREET4, T3FREE, THYROIDAB in the last 72 hours. Anemia Panel: No results for input(s): VITAMINB12, FOLATE, FERRITIN, TIBC, IRON, RETICCTPCT in the last 72 hours. Sepsis Labs: No results for input(s): PROCALCITON, LATICACIDVEN in the last 168 hours.  No results found for this or any previous visit (from the past 240 hour(s)).   Radiology Studies: Dg Chest Port 1 View  Result Date: 03/09/2017 CLINICAL DATA:  Shortness of Breath EXAM: PORTABLE CHEST 1 VIEW COMPARISON:  03/07/2017 FINDINGS: There is hyperinflation of the lungs compatible with COPD. Heart and mediastinal contours are within normal limits. Scarring in the right upper lobe. Otherwise, no focal opacities or effusions. No acute bony abnormality. IMPRESSION: COPD.  No active disease. Electronically Signed   By: Charlett Nose M.D.   On: 03/09/2017 10:30   Scheduled Meds: . aspirin  81 mg Oral Daily  . atorvastatin  20 mg Oral Daily  . calcium-vitamin D  1 tablet Oral Q breakfast  . enoxaparin (LOVENOX) injection  30  mg Subcutaneous Q24H  . furosemide  40 mg Oral BID  . guaiFENesin  1,200 mg Oral BID  . insulin aspart  0-9 Units Subcutaneous TID WC  . ipratropium  0.5 mg Nebulization TID  . levalbuterol  0.63 mg Nebulization TID  . losartan  50 mg Oral Daily  . magnesium gluconate  500 mg Oral Daily  . methylPREDNISolone (SOLU-MEDROL) injection  60 mg Intravenous Q6H  . mometasone-formoterol  2 puff Inhalation BID  . nebivolol  5 mg Oral Daily  . polyethylene glycol  17 g Oral BID   Continuous Infusions: . doxycycline (VIBRAMYCIN) IV  Stopped (03/09/17 1255)    LOS: 2 days   Merlene Laughter, DO [facility] Pager [phone]  If 7PM-7AM, please contact night-coverage [email or web address] Password [email or web address] 03/09/2017, 10:03 PM

## 2017-03-10 DIAGNOSIS — J9622 Acute and chronic respiratory failure with hypercapnia: Secondary | ICD-10-CM

## 2017-03-10 DIAGNOSIS — Z515 Encounter for palliative care: Secondary | ICD-10-CM

## 2017-03-10 LAB — RESPIRATORY PANEL BY PCR
ADENOVIRUS-RVPPCR: NOT DETECTED
Bordetella pertussis: NOT DETECTED
CORONAVIRUS HKU1-RVPPCR: NOT DETECTED
CORONAVIRUS NL63-RVPPCR: NOT DETECTED
Chlamydophila pneumoniae: NOT DETECTED
Coronavirus 229E: NOT DETECTED
Coronavirus OC43: NOT DETECTED
Influenza A: NOT DETECTED
Influenza B: NOT DETECTED
METAPNEUMOVIRUS-RVPPCR: NOT DETECTED
Mycoplasma pneumoniae: NOT DETECTED
PARAINFLUENZA VIRUS 3-RVPPCR: NOT DETECTED
PARAINFLUENZA VIRUS 4-RVPPCR: NOT DETECTED
Parainfluenza Virus 1: NOT DETECTED
Parainfluenza Virus 2: NOT DETECTED
RHINOVIRUS / ENTEROVIRUS - RVPPCR: NOT DETECTED
Respiratory Syncytial Virus: NOT DETECTED

## 2017-03-10 LAB — PHOSPHORUS: Phosphorus: 3.9 mg/dL (ref 2.5–4.6)

## 2017-03-10 LAB — CBC WITH DIFFERENTIAL/PLATELET
BASOS ABS: 0 10*3/uL (ref 0.0–0.1)
Basophils Relative: 0 %
Eosinophils Absolute: 0 10*3/uL (ref 0.0–0.7)
Eosinophils Relative: 0 %
HEMATOCRIT: 32 % — AB (ref 36.0–46.0)
Hemoglobin: 10.2 g/dL — ABNORMAL LOW (ref 12.0–15.0)
LYMPHS ABS: 0.6 10*3/uL — AB (ref 0.7–4.0)
LYMPHS PCT: 5 %
MCH: 30.2 pg (ref 26.0–34.0)
MCHC: 31.9 g/dL (ref 30.0–36.0)
MCV: 94.7 fL (ref 78.0–100.0)
Monocytes Absolute: 0.7 10*3/uL (ref 0.1–1.0)
Monocytes Relative: 6 %
NEUTROS ABS: 11.6 10*3/uL — AB (ref 1.7–7.7)
Neutrophils Relative %: 89 %
Platelets: 340 10*3/uL (ref 150–400)
RBC: 3.38 MIL/uL — ABNORMAL LOW (ref 3.87–5.11)
RDW: 13 % (ref 11.5–15.5)
WBC: 12.9 10*3/uL — AB (ref 4.0–10.5)

## 2017-03-10 LAB — COMPREHENSIVE METABOLIC PANEL
ALK PHOS: 34 U/L — AB (ref 38–126)
ALT: 22 U/L (ref 14–54)
AST: 23 U/L (ref 15–41)
Albumin: 3.3 g/dL — ABNORMAL LOW (ref 3.5–5.0)
Anion gap: 8 (ref 5–15)
BILIRUBIN TOTAL: 0.5 mg/dL (ref 0.3–1.2)
BUN: 40 mg/dL — AB (ref 6–20)
CALCIUM: 8.5 mg/dL — AB (ref 8.9–10.3)
CO2: 32 mmol/L (ref 22–32)
CREATININE: 1.12 mg/dL — AB (ref 0.44–1.00)
Chloride: 100 mmol/L — ABNORMAL LOW (ref 101–111)
GFR, EST AFRICAN AMERICAN: 53 mL/min — AB (ref >60)
GFR, EST NON AFRICAN AMERICAN: 45 mL/min — AB (ref >60)
Glucose, Bld: 133 mg/dL — ABNORMAL HIGH (ref 65–99)
Potassium: 3.9 mmol/L (ref 3.5–5.1)
Sodium: 140 mmol/L (ref 135–145)
TOTAL PROTEIN: 6 g/dL — AB (ref 6.5–8.1)

## 2017-03-10 LAB — GLUCOSE, CAPILLARY
Glucose-Capillary: 132 mg/dL — ABNORMAL HIGH (ref 65–99)
Glucose-Capillary: 142 mg/dL — ABNORMAL HIGH (ref 65–99)
Glucose-Capillary: 129 mg/dL — ABNORMAL HIGH (ref 65–99)
Glucose-Capillary: 142 mg/dL — ABNORMAL HIGH (ref 65–99)

## 2017-03-10 LAB — MAGNESIUM: MAGNESIUM: 2.3 mg/dL (ref 1.7–2.4)

## 2017-03-10 MED ORDER — FUROSEMIDE 40 MG PO TABS
40.0000 mg | ORAL_TABLET | Freq: Every day | ORAL | Status: DC
  Administered 2017-03-11 – 2017-03-14 (×4): 40 mg via ORAL
  Filled 2017-03-10 (×4): qty 1

## 2017-03-10 MED ORDER — MORPHINE SULFATE (CONCENTRATE) 10 MG/0.5ML PO SOLN
2.5000 mg | ORAL | Status: DC | PRN
  Administered 2017-03-10 – 2017-03-13 (×20): 2.6 mg via SUBLINGUAL
  Administered 2017-03-14 (×3): 5 mg via SUBLINGUAL
  Filled 2017-03-10 (×23): qty 0.5

## 2017-03-10 MED ORDER — DOXYCYCLINE HYCLATE 100 MG IV SOLR
100.0000 mg | Freq: Two times a day (BID) | INTRAVENOUS | Status: AC
  Administered 2017-03-10: 100 mg via INTRAVENOUS
  Filled 2017-03-10: qty 100

## 2017-03-10 MED ORDER — LEVALBUTEROL HCL 0.63 MG/3ML IN NEBU: 0.6300 mg | INHALATION_SOLUTION | Freq: Four times a day (QID) | RESPIRATORY_TRACT | Status: DC | PRN

## 2017-03-10 MED ORDER — DOXYCYCLINE HYCLATE 100 MG PO TABS
100.0000 mg | ORAL_TABLET | Freq: Two times a day (BID) | ORAL | Status: DC
  Administered 2017-03-10 – 2017-03-13 (×7): 100 mg via ORAL
  Filled 2017-03-10 (×7): qty 1

## 2017-03-10 MED ORDER — MORPHINE SULFATE (CONCENTRATE) 10 MG/0.5ML PO SOLN: 2.5000 mg | ORAL | Status: DC | PRN

## 2017-03-10 MED ORDER — METHYLPREDNISOLONE SODIUM SUCC 125 MG IJ SOLR: 60.0000 mg | Freq: Two times a day (BID) | INTRAMUSCULAR | Status: DC

## 2017-03-10 MED ORDER — TIOTROPIUM BROMIDE MONOHYDRATE 18 MCG IN CAPS
18.0000 ug | ORAL_CAPSULE | Freq: Every day | RESPIRATORY_TRACT | Status: DC
  Administered 2017-03-11 – 2017-03-14 (×4): 18 ug via RESPIRATORY_TRACT
  Filled 2017-03-10: qty 5

## 2017-03-10 MED ORDER — METHYLPREDNISOLONE SODIUM SUCC 40 MG IJ SOLR
40.0000 mg | Freq: Four times a day (QID) | INTRAMUSCULAR | Status: DC
  Administered 2017-03-10 – 2017-03-12 (×7): 40 mg via INTRAVENOUS
  Filled 2017-03-10 (×7): qty 1

## 2017-03-10 NOTE — Consult Note (Signed)
   [facility]   03/10/2017  Terald SleeperHazel H Langelier 1935-01-11 [id number]   [facility]. Spoke with inaptient RNCM, Silva BandyKristi, who states that a Care Connections consult will be made for home based palliative care.  Chart reviewed of progress notes.  Patient has been engaged by a [facility] RN Health Coach Our community based plan of care has focused on disease management and community resource support.  Consent form signed and folder given.   Made Inpatient Case Manager aware that [facility] following for Care Connections assessment.  Of note, [facility] services does not replace or interfere with any services that are needed or arranged by inpatient case management or social work.  For additional questions or referrals please contact:  Charlesetta ShanksVictoria Jayma Volpi, RN BSN CCM [facility]  [phone] business mobile phone Toll free office [phone]

## 2017-03-10 NOTE — Progress Notes (Signed)
2.6 mg of morphine solution given orally.  Wasted 7.4 mg in sink witnessed by Dutch Grayeresa M, RN. empty syringe placed in sharps.   Will monitor closely for effectiveness.

## 2017-03-10 NOTE — Progress Notes (Addendum)
[age] yof with advanced COPD.  Talked with patient and husband at bedside.  Patient states this admission has showed me how sick I really am.  She has just refused to work with PT due to SOB and exhaustion.  We discussed code status.  She quickly states if I pass on do not resuscitate me.  We discussed whether or not she wants to continue to use bipap and she does.  Consequently in the hospital she will be a partial code - use bipap when necessary.  Outside of the hospital she will be a DNR.  We discussed hospice services vs home health services.  She and her husband state that they are not sure what she will need until she is at home.  Consequently we decided on Palliative care to follow at home.  Patient states the hospital bed has been very helpful in controlling her dyspnea and she would like to have one at home.  Recommendations: Changing code status from full to partial (allow bipap) inpatient.  DNR outpatient Palliative Care to follow at home on discharge.  PMT will follow peripherally.  Please call the office [phone] if we are needed.  Norvel RichardsMarianne Taytum Scheck, PA-C Palliative Medicine Pager: [phone]  Addendum:  Attending physician discussed used of PRN morphine for dyspnea with me.  We agreed to try SL morphine PRN to see if it benefits her.

## 2017-03-10 NOTE — Consult Note (Signed)
PULMONARY / CRITICAL CARE MEDICINE   Name: [name] MRN: [id number] DOB: Dec 13, 1934    ADMISSION DATE:  03/07/2017 CONSULTATION DATE:  03/10/2017  REFERRING MD:  Dr. Tiffany KocherShariff  CHIEF COMPLAINT:  Short or breath  HISTORY OF PRESENT ILLNESS:   82 yo female brought to ER with dyspnea.  This was associated with cough, and leg swelling.  She needed Bipap in ER.  She was felt to have COPD exacerbation.  She is normally followed in pulmonary office by Dr. Delton CoombesByrum for GOLD 4D COPD with emphysema and chronic respiratory failure.  I saw her on 02/08/17 for COPD exacerbation.  She felt better when she was on prednisone.  She is having cough with yellow sputum.  She was having wheeze, but feels this is better.  Still tight in her chest.  Feels that Bipap helps.  Denies sinus congestion, sore throat, fever, nausea.  Leg swelling better.  PAST MEDICAL HISTORY :  She  has a past medical history of Actinomycosis, cervicofacial; CAD (coronary artery disease); COPD (chronic obstructive pulmonary disease) (HCC); Emphysema; HTN (hypertension); echocardiogram; Hyperlipidemia; and PVD (peripheral vascular disease) (HCC).  PAST SURGICAL HISTORY: She  has a past surgical history that includes Appendectomy; Tonsillectomy; and Bone graft hip iliac crest.  Allergies  Allergen Reactions  . Levaquin [Levofloxacin In D5w] Other (See Comments)    Can't sleep   . Penicillins Hives    Has taken it since.  . Pneumococcal Vaccine Swelling    Severe localized swelling in arm  . Pneumovax [Pneumococcal Polysaccharide Vaccine] Hives    No current facility-administered medications on file prior to encounter.    Current Outpatient Prescriptions on File Prior to Encounter  Medication Sig  . albuterol (PROVENTIL HFA;VENTOLIN HFA) 108 (90 BASE) MCG/ACT inhaler Inhale 2 puffs into the lungs every 6 (six) hours as needed. For shortness of breath  . aspirin 81 MG tablet Take 81 mg by mouth daily.    Marland Kitchen.  aspirin-acetaminophen-caffeine (EXCEDRIN MIGRAINE) 250-250-65 MG tablet Take 1 tablet by mouth every 6 (six) hours as needed for headache.  Marland Kitchen. atorvastatin (LIPITOR) 20 MG tablet Take 1 tablet (20 mg total) by mouth daily.  . budesonide-formoterol (SYMBICORT) 160-4.5 MCG/ACT inhaler Inhale 2 puffs into the lungs 2 (two) times daily.  Marland Kitchen. BYSTOLIC 5 MG tablet TAKE 1 TABLET (5 MG TOTAL) BY MOUTH DAILY.  . Calcium Carb-Cholecalciferol (CALCIUM 600 + D PO) Take 1 tablet by mouth daily.  . clonazePAM (KLONOPIN) 0.5 MG tablet Take 0.5 tablets (0.25 mg total) by mouth 2 (two) times daily.  . furosemide (LASIX) 40 MG tablet Take 1 tablet (40 mg total) by mouth 2 (two) times daily.  Marland Kitchen. losartan (COZAAR) 50 MG tablet Take 1 tablet (50 mg total) by mouth daily.  . Magnesium 500 MG CAPS Take 1 capsule by mouth daily.  . Multiple Vitamin (MULTIVITAMIN) tablet Take 1 tablet by mouth daily.    Marland Kitchen. SPIRIVA RESPIMAT 2.5 MCG/ACT AERS INHALE 2 PUFFS INTO THE LUNGS DAILY.  Marland Kitchen. UNABLE TO FIND Inhale 1.5-3 L into the lungs continuous.     FAMILY HISTORY:  Her indicated that her mother is deceased. She indicated that her father is deceased. She indicated that three of her seven sisters are deceased. She indicated that the status of her brother is unknown. She indicated that her maternal grandmother is deceased. She indicated that her maternal grandfather is deceased. She indicated that her paternal grandmother is deceased. She indicated that her paternal grandfather is deceased. She indicated that  the status of her unknown relative is unknown.    SOCIAL HISTORY: She  reports that she quit smoking about 11 years ago. Her smoking use included Cigarettes. She has a 49.00 pack-year smoking history. She has never used smokeless tobacco. She reports that she does not drink alcohol or use drugs.  REVIEW OF SYSTEMS:   12 point negative except above  VITAL SIGNS: BP (!) 108/54 (BP Location: Right Arm)   Pulse 70   Temp 98.1 F  (36.7 C) (Oral)   Resp 15   Ht 5\' 1"  (1.549 m)   Wt 123 lb 14.4 oz (56.2 kg)   SpO2 100%   BMI 23.41 kg/m   HEMODYNAMICS:    VENTILATOR SETTINGS:    INTAKE / OUTPUT: I/O last 3 completed shifts: In: 970 [P.O.:720; IV Piggyback:250] Out: 1950 [Urine:1950]  PHYSICAL EXAMINATION: General:  thin Neuro:  Alert, normal strength, CN intact HEENT:  Pupils reactive, no sinus tenderness, no stridor Cardiovascular:  Regular, no murmur Lungs:  Prolonged exhalation, decreased breath sounds, no wheeze Abdomen:  Soft, non tender Musculoskeletal:  No edema Skin:  No rashes  LABS:  BMET  Recent Labs Lab 03/08/17 0357 03/09/17 0315 03/10/17 0247  NA 136 140 140  K 4.7 4.2 3.9  CL 97* 100* 100*  CO2 29 32 32  BUN 31* 34* 40*  CREATININE 0.94 1.03* 1.12*  GLUCOSE 144* 140* 133*    Electrolytes  Recent Labs Lab 03/08/17 0357 03/09/17 0315 03/10/17 0247  CALCIUM 8.9 8.3* 8.5*  MG  --  2.4 2.3  PHOS  --  4.1 3.9    CBC  Recent Labs Lab 03/08/17 0357 03/09/17 0315 03/10/17 0247  WBC 11.7* 14.8* 12.9*  HGB 9.7* 9.7* 10.2*  HCT 29.5* 30.4* 32.0*  PLT 332 346 340    Coag's No results for input(s): APTT, INR in the last 168 hours.  Sepsis Markers No results for input(s): LATICACIDVEN, PROCALCITON, O2SATVEN in the last 168 hours.  ABG  Recent Labs Lab 03/07/17 0439 03/07/17 1642  PHART 7.383 7.400  PCO2ART 52.7* 47.6  PO2ART 167.0* 112.0*    Liver Enzymes  Recent Labs Lab 03/09/17 0315 03/10/17 0247  AST 25 23  ALT 21 22  ALKPHOS 35* 34*  BILITOT 0.5 0.5  ALBUMIN 3.4* 3.3*    Cardiac Enzymes  Recent Labs Lab 03/07/17 0335  TROPONINI <0.03    Glucose  Recent Labs Lab 03/08/17 2114 03/09/17 0613 03/09/17 1138 03/09/17 1615 03/09/17 2128 03/10/17 0637  GLUCAP 126* 149* 145* 104* 146* 132*    Imaging No results found.   STUDIES:  Echo 8/30 >> EF 55 to 60%, grade 1 DD, mild MR  CULTURES: Respiratory viral panel  >>  ANTIBIOTICS: Levaquin 8/30 >> 8/30 Doxycycline 8/30 >>   SIGNIFICANT EVENTS: 8/28 Admit 8/29 Palliative care consult >> full code   ASSESSMENT / PLAN:  COPD exacerbation. Acute on chronic hypoxic, hypercapnic respiratory failure. Hx of GOLD D 4 COPD with emphysema. - she feels inhalers work better than nebulizer tx - dulera (use symbicort as outpt), spiriva - change solumedrol to 40 mg q6h - prn albuterol - bipap prn - oxygen to keep SpO2 90 to 95% - day 2 of doxycycline  Coralyn Helling, MD [facility] Pulmonary/Critical Care 03/10/2017, 11:15 AM Pager:  [phone] After 3pm call: [phone]

## 2017-03-10 NOTE — Progress Notes (Signed)
PHARMACIST - PHYSICIAN COMMUNICATION  CONCERNING: Antibiotic IV to Oral Route Change Policy  RECOMMENDATION: This patient is receiving doxycycline by the intravenous route.  Based on criteria approved by the Pharmacy and Therapeutics Committee, the antibiotic(s) is/are being converted to the equivalent oral dose form(s).   DESCRIPTION: These criteria include:  Patient being treated for a respiratory tract infection, urinary tract infection, cellulitis or clostridium difficile associated diarrhea if on metronidazole  The patient is not neutropenic and does not exhibit a GI malabsorption state  The patient is eating (either orally or via tube) and/or has been taking other orally administered medications for a least 24 hours  The patient is improving clinically and has a Tmax < 100.5  If you have questions about this conversion, please contact the Pharmacy Department  []   [phone]( [phone] )  [facility] []   [phone]( [phone] )  [facility] [x]   [phone]( [phone] )  [facility] []   [phone]( [phone] )  [facility] []   [phone]( [phone] )  [facility]   Harland Germanndrew Rafaella Kole, [place] 03/10/2017 9:38 AM

## 2017-03-10 NOTE — Progress Notes (Signed)
PROGRESS NOTE    [name]  ZOX:[id number] DOB: Sep 12, 1934 DOA: 03/07/2017 PCP: [name], MD  Brief Narrative:  [name] is a pleasant 82 y.o. female with medical history significant for hypoxic respiratory failure on home oxygen related to COPD, diastolic dysfunction, hypertension, CAD, anemia, anxiety presents to the emergency department with the chief complaint of worsening shortness of breath. Initial evaluation reveals acute on chronic respiratory failure likely related to COPD exacerbation. She stated that over the last week she's experience worsening shortness of breath. She is also had intermittent productive cough and mild increase in lower extremity edema. She denies chest pain palpitations headache dizziness syncope or near-syncope. She denies any abdominal pain nausea vomiting diarrhea constipation melena or bright red blood per rectum. She saw her neurologist 3 weeks ago she was placed on prednisone which she's completed. She also reports she saw a cardiologist around the same time increase her Lasix from 20-40 mg. She was admitted for Acute on Chronic Hypoxic Respiratory Failure from COPD Exacerbation. Palliative Care Medicine and Pulmonary have been consulted for further evaluation and management.   Assessment & Plan:   Principal Problem:   Acute on chronic respiratory failure (HCC) Active Problems:   HTN (hypertension)   CAD (coronary artery disease), native coronary artery   HLD (hyperlipidemia)   Hypertrophic cardiomyopathy (HCC)   Anxiety   Iron deficiency anemia   Tachycardia   Diastolic dysfunction   COPD with acute exacerbation (HCC)   Hyperglycemia   Goals of care, counseling/discussion   Palliative care by specialist   Palliative care encounter  1. Acute on Chronic respiratory failure requiring NIPPV likely related to COPD with acute exacerbation. Now weaned to [facility] and 3 Liters  -Patient is on home oxygen at 2-3 L 24/ 7.  -Initial ABG reveals pH  7.38, PCO2 52.7, PO2 167. Repeat was 7.40, PCO2 was 47.6, and PO2 was 112.0.  -Chest x-ray reveals hyperinflation with emphysematous changes and scarring in the right lung. No infiltrate.  -EKG was tachycardic at 101 with atrial premature complex right atrial enlargement.  -Time of admission she was on BiPAP with moderate increased work of breathing and oxygen saturation level 88% on 3 L nasal cannula; C/w BiPAP qHS and Prn -Goal is to Maintain O2 Saturations 90-95% -Admitted to Step Down -Scheduled Ipratropium 0.5 mg q6h with Levalbuterol 0.63 q6h now D/C'd by Pulmonary. C/w Albuterol 2.5 mg Neb q2hprn wheezing -C/w Dulera and Spiriva -IV Methylprednisolone 60 mg q6h changed to 40 mg IV q6h by Pulmonary  -Levaquin 500 mg po changed to IV Doxycyxline; Patient is Day 2 on Doxycyline -Monitor respiratory effort and oxygen saturation level and put back on BiPAP as indicated -Added Flutter Valve and increased Guaifenesin.  -Checked ECHOcardiogram and showed Systolic function was normal. The estimated ejection fraction was in the range of 55% to 60%. Wall motion was normal; there were no regional wall motion abnormalities. Doppler parameters are consistent with abnormal left ventricular relaxation (grade 1 diastolic dysfunction). -Pulmonary following and appreciate evaluation and recommendations   2. COPD with acute exacerbation.  -Chart review indicates patient has had worsening shortness of breath over the last couple of weeks.  -Saw her pulmonology M.D. 3 weeks ago. Office note indicates she was given a course of prednisone, antibiotics were deferred at that time.  -Office note also indicates considering changing to nebulized LAMA, LABA and ICS. In addition office note indicates patient may benefit from palliative care assessment -Nebulizers as noted above -Continue Dulera substitution;  Spiriva restarted by Pulmonary' -C/w Morphine Concentrate 2.6-5 mg SL q2hprn for SOB -C/w Magnesium Gluconate  500 mg po Daily -Solu-Medrol as above -Check Respiratory Virus Panel (Pending) and Checked ECHO which showed EF of 55-60% with G1DD -Pulmonary consulted for further evaluation and management -Repeat CXR showed COPD  -Palliative care consulted for Goals of Care and Psychospiritual Support; C/w Current care but no escalation in Care  3. Tachycardia/CAD.  -Likely related to nebs. Mild.; Nebs now D/C'd -Initial troponin negative.  -Patient denies chest pain.  -Review indicates recent Myoview low risk -Monitor on telemetry -Continue home meds and Home Nebivolol   4. Hyperglycemia.  -Likely steroid-induced. CBG's ranging from 104-146  -Hemoglobin A1c was 5.6 -C/w Sensitive Novolog Sliding scale insulin AC  5. Hypertension.  -BP, low end of normal in the emergency department.  -Home medications include Bystolic, Lasix, Cozaar -Restarted Lasix for now but will cut back to daily instead of BID -Continue home meds -Continue to Monitor  6. Diastolic dysfunction/hypertrophic cardiomyopathy.  -Appears stable at baseline  -Chart review indicates office visit with cardiology 2 weeks ago. Echo in March 2017 showed vigorous LVEF with an EF of 65% mild grade 1 diastolic dysfunction. -Repeat ECHOCARDIOGRAM as below -Daily weights -Monitor intake and output -Held Lasix but now restarted  -Monitor  7. Acute Kidney Injury.  -Creatinine 1.26 on Admission. Likely related decreased oral intake in the setting of Lasix and Cozaar. -IVF D/C'd and Lasix restarted; Changed po Lasix 40 mg BID to 40 mg Daily because of worsening Cr -BUN/Cr went from 31/0.94 -> 34/1.03 -> 40/1.12 -Repeat CMP in AM  8. Iron Deficiency Anemia.  -Hemoglobin 11.4 on admission. Hb/Hct Dropped to 9.7/30.4. ?Dilutional Drop -Continue home meds  9. Anxiety. -Patient appears slightly anxious. -Continue home meds of Clonazepam 0.25 mg po BID  10. Leukocytosis  -Mild as WBC went from 11.7 -> 14.8 -> 12.9 -Likely from  IV Steroid Demargination -Continue to Monitor for S/Sx of Infection  DVT prophylaxis: Enoxaparin 30 mg sq q24 Code Status: FULL CODE Family Communication: Discussed with Husband at bedside Disposition Plan: Remain in SDU and transfer to floor if Stable in AM not requiring BiPAP  Consultants:   Palliative Care Medicine  Pulmonary Dr. Craige Cotta   Procedures:  ECHOCARDIOGRAM  - Left ventricle: The cavity size was normal. There was mild focal   basal hypertrophy of the septum. Systolic function was normal.   The estimated ejection fraction was in the range of 55% to 60%.   Wall motion was normal; there were no regional wall motion   abnormalities. Doppler parameters are consistent with abnormal   left ventricular relaxation (grade 1 diastolic dysfunction).   Doppler parameters are consistent with indeterminate ventricular   filling pressure. - Aortic valve: Transvalvular velocity was within the normal range.   There was no stenosis. There was no regurgitation. - Mitral valve: Moderately calcified annulus. Transvalvular   velocity was within the normal range. There was no evidence for   stenosis. There was mild regurgitation. - Right ventricle: The cavity size was normal. Wall thickness was   normal. Systolic function was normal. - Atrial septum: No defect or patent foramen ovale was identified. - Tricuspid valve: There was no regurgitation.   Antimicrobials: Anti-infectives    Start     Dose/Rate Route Frequency Ordered Stop   03/10/17 2200  doxycycline (VIBRA-TABS) tablet 100 mg     100 mg Oral Every 12 hours 03/10/17 0940     03/10/17 1000  doxycycline (VIBRAMYCIN)  100 mg in dextrose 5 % 250 mL IVPB     100 mg 125 mL/hr over 120 Minutes Intravenous Every 12 hours 03/10/17 0940 03/10/17 1156   03/09/17 1000  doxycycline (VIBRAMYCIN) 100 mg in dextrose 5 % 250 mL IVPB  Status:  Discontinued     100 mg 125 mL/hr over 120 Minutes Intravenous Every 12 hours 03/09/17 0922 03/10/17  0940   03/07/17 0800  levofloxacin (LEVAQUIN) tablet 500 mg  Status:  Discontinued     500 mg Oral Every 48 hours 03/07/17 0707 03/09/17 6045     Subjective: Seen and examined and stated the breathing treatments are making her severely SOB. No nausea or vomiting. No CP but still remains dyspneic and was whezzing earlier. No other concerns or complaints at this time.    Objective: Vitals:   03/10/17 0539 03/10/17 0714 03/10/17 0759 03/10/17 1210  BP: (!) 121/54  (!) 108/54   Pulse: 70     Resp: 19  15 17   Temp: 99.1 F (37.3 C)  98.1 F (36.7 C)   TempSrc: Oral  Oral   SpO2: 100% 100%    Weight: 56.2 kg (123 lb 14.4 oz)     Height:        Intake/Output Summary (Last 24 hours) at 03/10/17 1622 Last data filed at 03/10/17 1057  Gross per 24 hour  Intake              490 ml  Output             1350 ml  Net             -860 ml   Filed Weights   03/08/17 0416 03/09/17 0401 03/10/17 0539  Weight: 57.9 kg (127 lb 11.2 oz) 56.6 kg (124 lb 11.2 oz) 56.2 kg (123 lb 14.4 oz)   Examination: Physical Exam:  Constitutional: Pleasant thin Caucasian female in mild respiratory distress Eyes: Sclerae anicteric; Conjunctivae non-injected ENMT: Grossly normal hearing. Mucous membranes appear moist Neck: Supple with no JVD;  Respiratory: Diminished breath sounds with pursed lip breathing. Wearing supplemental O2 via [place]; No appreciable wheezing but had mild Rhonchi Cardiovascular: Tachycardic Rate but regular rhythm. Trace edema Abdomen: Soft, NT, ND. Bowel sounds present GU: Deferred Musculoskeletal: No contractures; No cyanosis Skin: Warm and dry; No rashes or lesions on a limited skin eval Neurologic: CN2-12 grossly intact with no focal deficits. Psychiatric: Pleasant mood and affect. Intact judgement and insight  Data Reviewed: I have personally reviewed following labs and imaging studies  CBC:  Recent Labs Lab 03/07/17 0335 03/08/17 0357 03/09/17 0315 03/10/17 0247  WBC 10.2  11.7* 14.8* 12.9*  NEUTROABS 5.4  --  13.4* 11.6*  HGB 11.4* 9.7* 9.7* 10.2*  HCT 35.0* 29.5* 30.4* 32.0*  MCV 94.3 93.4 94.1 94.7  PLT 375 332 346 340   Basic Metabolic Panel:  Recent Labs Lab 03/07/17 0335 03/08/17 0357 03/09/17 0315 03/10/17 0247  NA 139 136 140 140  K 4.2 4.7 4.2 3.9  CL 99* 97* 100* 100*  CO2 27 29 32 32  GLUCOSE 187* 144* 140* 133*  BUN 32* 31* 34* 40*  CREATININE 1.26* 0.94 1.03* 1.12*  CALCIUM 9.2 8.9 8.3* 8.5*  MG  --   --  2.4 2.3  PHOS  --   --  4.1 3.9   GFR: Estimated Creatinine Clearance: 30.7 mL/min (A) (by C-G formula based on SCr of 1.12 mg/dL (H)). Liver Function Tests:  Recent Labs Lab 03/09/17 0315 03/10/17  0247  AST 25 23  ALT 21 22  ALKPHOS 35* 34*  BILITOT 0.5 0.5  PROT 5.9* 6.0*  ALBUMIN 3.4* 3.3*   No results for input(s): LIPASE, AMYLASE in the last 168 hours. No results for input(s): AMMONIA in the last 168 hours. Coagulation Profile: No results for input(s): INR, PROTIME in the last 168 hours. Cardiac Enzymes:  Recent Labs Lab 03/07/17 0335  TROPONINI <0.03   BNP (last 3 results)  Recent Labs  07/15/16 1542 01/26/17 1137  PROBNP 90 91   HbA1C: No results for input(s): HGBA1C in the last 72 hours. CBG:  Recent Labs Lab 03/09/17 1138 03/09/17 1615 03/09/17 2128 03/10/17 0637 03/10/17 1156  GLUCAP 145* 104* 146* 132* 142*   Lipid Profile: No results for input(s): CHOL, HDL, LDLCALC, TRIG, CHOLHDL, LDLDIRECT in the last 72 hours. Thyroid Function Tests: No results for input(s): TSH, T4TOTAL, FREET4, T3FREE, THYROIDAB in the last 72 hours. Anemia Panel: No results for input(s): VITAMINB12, FOLATE, FERRITIN, TIBC, IRON, RETICCTPCT in the last 72 hours. Sepsis Labs: No results for input(s): PROCALCITON, LATICACIDVEN in the last 168 hours.  No results found for this or any previous visit (from the past 240 hour(s)).   Radiology Studies: Dg Chest Port 1 View  Result Date: 03/09/2017 CLINICAL  DATA:  Shortness of Breath EXAM: PORTABLE CHEST 1 VIEW COMPARISON:  03/07/2017 FINDINGS: There is hyperinflation of the lungs compatible with COPD. Heart and mediastinal contours are within normal limits. Scarring in the right upper lobe. Otherwise, no focal opacities or effusions. No acute bony abnormality. IMPRESSION: COPD.  No active disease. Electronically Signed   By: Charlett NoseKevin  Dover M.D.   On: 03/09/2017 10:30   Scheduled Meds: . aspirin  81 mg Oral Daily  . atorvastatin  20 mg Oral Daily  . calcium-vitamin D  1 tablet Oral Q breakfast  . doxycycline  100 mg Oral Q12H  . enoxaparin (LOVENOX) injection  30 mg Subcutaneous Q24H  . furosemide  40 mg Oral BID  . guaiFENesin  1,200 mg Oral BID  . insulin aspart  0-9 Units Subcutaneous TID WC  . levalbuterol  0.63 mg Nebulization TID  . losartan  50 mg Oral Daily  . magnesium gluconate  500 mg Oral Daily  . methylPREDNISolone (SOLU-MEDROL) injection  40 mg Intravenous Q6H  . mometasone-formoterol  2 puff Inhalation BID  . nebivolol  5 mg Oral Daily  . polyethylene glycol  17 g Oral BID  . tiotropium  18 mcg Inhalation Daily   Continuous Infusions:   LOS: 3 days   Merlene Laughtermair Latif Sheikh, DO [facility] Pager [phone]  If 7PM-7AM, please contact night-coverage [email or web address] Password [email or web address] 03/10/2017, 4:22 PM

## 2017-03-10 NOTE — Progress Notes (Signed)
PT Cancellation Note  Patient Details Name: [name] MRN: [id number] DOB: January 24, 1935   Cancelled Treatment:    Reason Eval/Treat Not Completed: Patient declined, no reason specified Pt declined mobility. Therapist provided education on benefits of OOB mobility and encouragement. PT will check on pt later as time allows.    Derek MoundKellyn R Alexios Keown Johany Hansman, PTA Pager: [phone]   03/10/2017, 11:37 AM

## 2017-03-10 NOTE — Care Management Note (Signed)
Case Management Note Donn PieriniKristi Adonna Horsley RN, BSN Unit 4E-Case Manager [phone]  Patient Details  Name: Sheri SleeperHazel H Tisdel MRN: [id number] Date of Birth: 12-31-1934  Subjective/Objective:  Pt admitted with COPD                Action/Plan: PTA pt lived at home with spouse- has home 4002- PCP-  Holwerda - CM to follow for d/c needs- referral for COPD GOLD pt does not have greater than 3 admissions in last 6 mo.   Expected Discharge Date:                  Expected Discharge Plan:  Home w Home Health Services  In-House Referral:  Clinical Social Work, Hosp[facility] / Palliative Care, [facility]  Discharge planning Services  CM Consult  Post Acute Care Choice:  Durable Medical Equipment, Home Health Choice offered to:     DME Arranged:    DME Agency:     HH Arranged:    HH Agency:     Status of Service:  In process, will continue to follow  If discussed at Long Length of Stay Meetings, dates discussed:    Discharge Disposition:   Additional Comments:  03/09/17- 1430- Donn PieriniKristi Babbie Dondlinger RN, CM- [facility]- spoke with pt at bedside- pt [facility] that [facility]- [facility] CM- pt reports that [facility]- but [facility] over the [facility] pt is considering Home hospice- has decided on DNR for home- Gold DNR form on shadow chart for discharge. Pt has DME at home that includes- home 02 (baseline 3L), BSC, power w/c, 3 wheeled walker (tripod), scooter, ramps- she is thinking about hospital bed as she has to use step stool to get in her current bed at home. CM will follow for d/c needs- referral for PC- [facility]- msg left on referral line. Also [facility] with [place] [facility] who is also aware of [facility] referral.   Darrold SpanWebster, Naidelin Gugliotta Hall, RN 03/10/2017, 2:45 PM

## 2017-03-10 NOTE — Progress Notes (Signed)
PT Cancellation Note  Patient Details Name: [name] MRN: [id number] DOB: Jul 12, 1934   Cancelled Treatment:    Reason Eval/Treat Not Completed: Patient declined, no reason specified Patient declined. Second attempt today. Pt understands benefits of OOB mobility. PT will continue to follow acutely.    Derek MoundKellyn R Raveena Hebdon Cambry [place], PTA Pager: [phone]   03/10/2017, 2:44 PM

## 2017-03-10 NOTE — Progress Notes (Signed)
Pt on [place] at this time tolerating well. No distress noted. Pt states she does not feel like she needs Bipap at this time. RT will continue to monitor

## 2017-03-10 NOTE — Progress Notes (Signed)
OT Cancellation Note  Patient Details Name: [name] MRN: [id number] DOB: 1934/11/15   Cancelled Treatment:    Reason Eval/Treat Not Completed: Other (comment). Pt said she just did not feel like getting up, and that she understands why she needs to, but just not up to it".  Evette GeorgesLeonard, Ieisha Gao Eva [phone] 03/10/2017, 4:11 PM

## 2017-03-11 ENCOUNTER — Inpatient Hospital Stay (HOSPITAL_COMMUNITY): Payer: PPO

## 2017-03-11 DIAGNOSIS — J449 Chronic obstructive pulmonary disease, unspecified: Secondary | ICD-10-CM

## 2017-03-11 LAB — MAGNESIUM: MAGNESIUM: 2.5 mg/dL — AB (ref 1.7–2.4)

## 2017-03-11 LAB — CBC WITH DIFFERENTIAL/PLATELET
BASOS ABS: 0 10*3/uL (ref 0.0–0.1)
BASOS PCT: 0 %
EOS PCT: 0 %
Eosinophils Absolute: 0 10*3/uL (ref 0.0–0.7)
HCT: 33 % — ABNORMAL LOW (ref 36.0–46.0)
Hemoglobin: 10.6 g/dL — ABNORMAL LOW (ref 12.0–15.0)
Lymphocytes Relative: 9 %
Lymphs Abs: 0.9 10*3/uL (ref 0.7–4.0)
MCH: 29.9 pg (ref 26.0–34.0)
MCHC: 32.1 g/dL (ref 30.0–36.0)
MCV: 93 fL (ref 78.0–100.0)
Monocytes Absolute: 0.6 10*3/uL (ref 0.1–1.0)
Monocytes Relative: 6 %
Neutro Abs: 8.4 10*3/uL — ABNORMAL HIGH (ref 1.7–7.7)
Neutrophils Relative %: 85 %
PLATELETS: 341 10*3/uL (ref 150–400)
RBC: 3.55 MIL/uL — AB (ref 3.87–5.11)
RDW: 12.6 % (ref 11.5–15.5)
WBC: 9.8 10*3/uL (ref 4.0–10.5)

## 2017-03-11 LAB — COMPREHENSIVE METABOLIC PANEL
ALBUMIN: 3.3 g/dL — AB (ref 3.5–5.0)
ALT: 21 U/L (ref 14–54)
AST: 17 U/L (ref 15–41)
Alkaline Phosphatase: 31 U/L — ABNORMAL LOW (ref 38–126)
Anion gap: 10 (ref 5–15)
BUN: 41 mg/dL — AB (ref 6–20)
CHLORIDE: 100 mmol/L — AB (ref 101–111)
CO2: 30 mmol/L (ref 22–32)
Calcium: 8.7 mg/dL — ABNORMAL LOW (ref 8.9–10.3)
Creatinine, Ser: 0.97 mg/dL (ref 0.44–1.00)
GFR calc Af Amer: >60 mL/min (ref >60)
GFR calc non Af Amer: 54 mL/min — ABNORMAL LOW (ref >60)
GLUCOSE: 136 mg/dL — AB (ref 65–99)
POTASSIUM: 4.1 mmol/L (ref 3.5–5.1)
SODIUM: 140 mmol/L (ref 135–145)
Total Bilirubin: 0.3 mg/dL (ref 0.3–1.2)
Total Protein: 5.7 g/dL — ABNORMAL LOW (ref 6.5–8.1)

## 2017-03-11 LAB — GLUCOSE, CAPILLARY
GLUCOSE-CAPILLARY: 148 mg/dL — AB (ref 65–99)
GLUCOSE-CAPILLARY: 133 mg/dL — AB (ref 65–99)
GLUCOSE-CAPILLARY: 159 mg/dL — AB (ref 65–99)
Glucose-Capillary: 127 mg/dL — ABNORMAL HIGH (ref 65–99)

## 2017-03-11 LAB — PHOSPHORUS: Phosphorus: 3.7 mg/dL (ref 2.5–4.6)

## 2017-03-11 MED ORDER — ENOXAPARIN SODIUM 40 MG/0.4ML ~~LOC~~ SOLN
40.0000 mg | SUBCUTANEOUS | Status: DC
  Administered 2017-03-11 – 2017-03-13 (×3): 40 mg via SUBCUTANEOUS
  Filled 2017-03-11 (×3): qty 0.4

## 2017-03-11 MED ORDER — SENNOSIDES-DOCUSATE SODIUM 8.6-50 MG PO TABS
1.0000 | ORAL_TABLET | Freq: Two times a day (BID) | ORAL | Status: DC
  Administered 2017-03-11 – 2017-03-12 (×3): 1 via ORAL
  Filled 2017-03-11 (×2): qty 1

## 2017-03-11 MED ORDER — PANTOPRAZOLE SODIUM 40 MG PO TBEC
40.0000 mg | DELAYED_RELEASE_TABLET | Freq: Every day | ORAL | Status: DC
  Administered 2017-03-11 – 2017-03-14 (×4): 40 mg via ORAL
  Filled 2017-03-11 (×4): qty 1

## 2017-03-11 NOTE — Progress Notes (Signed)
RT called due to pt being SOB  Pt placed on Bipap 12/6 40%. Pt tolerating well at this time Rt will continue to monitor

## 2017-03-11 NOTE — Progress Notes (Signed)
PROGRESS NOTE    [name]  ZOX:[id number] DOB: 25-Feb-1935 DOA: 03/07/2017 PCP: [name], MD  Brief Narrative:  [name] is a pleasant 82 y.o. female with medical history significant for hypoxic respiratory failure on home oxygen related to COPD, diastolic dysfunction, hypertension, CAD, anemia, anxiety presents to the emergency department with the chief complaint of worsening shortness of breath. Initial evaluation reveals acute on chronic respiratory failure likely related to COPD exacerbation. She stated that over the last week she's experience worsening shortness of breath. She is also had intermittent productive cough and mild increase in lower extremity edema. She denies chest pain palpitations headache dizziness syncope or near-syncope. She denies any abdominal pain nausea vomiting diarrhea constipation melena or bright red blood per rectum. She saw her neurologist 3 weeks ago she was placed on prednisone which she's completed. She also reports she saw a cardiologist around the same time increase her Lasix from 20-40 mg. She was admitted for Acute on Chronic Hypoxic Respiratory Failure from COPD Exacerbation. Palliative Care Medicine and Pulmonary have been consulted for further evaluation and management. Patient slowly improving.   Assessment & Plan:   Principal Problem:   Acute on chronic respiratory failure (HCC) Active Problems:   HTN (hypertension)   CAD (coronary artery disease), native coronary artery   HLD (hyperlipidemia)   Hypertrophic cardiomyopathy (HCC)   Anxiety   Iron deficiency anemia   Tachycardia   Diastolic dysfunction   COPD with acute exacerbation (HCC)   Hyperglycemia   Goals of care, counseling/discussion   Palliative care by specialist   Palliative care encounter  1. Acute on Chronic respiratory failure requiring NIPPV likely related to COPD with acute exacerbation. Now weaned to [facility] and 3 Liters  -Patient is on home oxygen at 2-3 L 24/ 7.   -Initial Chest x-ray reveals hyperinflation with emphysematous changes and scarring in the right lung. No infiltrate.  -EKG was tachycardic at 101 with atrial premature complex right atrial enlargement.  -Time of admission she was on BiPAP with moderate increased work of breathing and oxygen saturation level 88% on 3 L nasal cannula; C/w BiPAP qHS and Prn -Goal is to Maintain O2 Saturations 90-95% -Admitted to Step Down -Scheduled Ipratropium 0.5 mg q6h with Levalbuterol 0.63 q6h now D/C'd by Pulmonary. C/w Albuterol 2.5 mg Neb q2hprn wheezing -C/w Dulera and Spiriva -IV Methylprednisolone 60 mg q6h changed to 40 mg IV q6h by Pulmonary and recommending changing to 40 mg q8h in AM; Started PPI given Long term Steroid use -Levaquin 500 mg po changed to IV Doxycyxline; Patient is Day 3 on Doxycyline -Monitor respiratory effort and oxygen saturation level and put back on BiPAP as indicated -Added Flutter Valve and increased Guaifenesin.  -Checked ECHOcardiogram and showed Systolic function was normal. The estimated ejection fraction was in the range of 55% to 60%. Wall motion was normal; there were no regional wall motion abnormalities. Doppler parameters are consistent with abnormal left ventricular relaxation (grade 1 diastolic dysfunction). -Pulmonary following and appreciate evaluation and recommendations   2. COPD with acute exacerbation.  -Chart review indicates patient has had worsening shortness of breath over the last couple of weeks.  -Saw her pulmonology M.D. 3 weeks ago. Office note indicates she was given a course of prednisone, antibiotics were deferred at that time.  -Office note also indicates considering changing to nebulized LAMA, LABA and ICS. In addition office note indicates patient may benefit from palliative care assessment -Nebulizers as noted above -Continue Dulera substitution; Spiriva  restarted by Pulmonary' -C/w Morphine Concentrate 2.6-5 mg SL q2hprn for SOB -C/w  Magnesium Gluconate 500 mg po Daily -Solu-Medrol as above -Checked Respiratory Virus Panel and was Negative and Checked ECHO which showed EF of 55-60% with G1DD -Pulmonary consulted for further evaluation and management -Repeat CXR 8/30 showed COPD  -Repeat CXR on 9/1 showed slight hyperinflation, no acute cardiopulmonary disease and aortic atherosclerosis  -Palliative care consulted for Goals of Care and Psychospiritual Support; C/w Current care but no escalation in Care -PT/OT to Evaluate and Treat  3. Tachycardia/CAD.  -Likely related to nebs. Mild.; Nebs now D/C'd -Initial troponin negative.  -Patient denies chest pain.  -Review indicates recent Myoview low risk -Monitor on telemetry -Continue home meds and Home Nebivolol   4. Hyperglycemia.  -Likely steroid-induced. CBG's ranging from 127-148 -Hemoglobin A1c was 5.6 -C/w Sensitive Novolog Sliding scale insulin AC  5. Hypertension.  -BP was low end of normal in the emergency department.  -Home medications include Bystolic, Lasix, Cozaar -Restarted Lasix for now but will cut back to daily instead of BID -Continue home meds -Continue to Monitor  6. Diastolic dysfunction/Hypertrophic Cardiomyopathy.  -Appears stable at baseline  -Chart review indicates office visit with cardiology 2 weeks ago. Echo in March 2017 showed vigorous LVEF with an EF of 65% mild grade 1 diastolic dysfunction. -Repeat ECHOCARDIOGRAM as below -Daily weights -Monitor intake and output; Patient is -2.390 Liters -Held Lasix initially but now restarted  -Monitor  7. Acute Kidney Injury.  -Creatinine 1.26 on Admission. Likely related decreased oral intake in the setting of Lasix and Cozaar. -IVF D/C'd and Lasix restarted; Changed po Lasix 40 mg BID to 40 mg Daily because of worsening Cr yesterday and Cr improved  -BUN/Cr went from 31/0.94 -> 34/1.03 -> 40/1.12 -> 41/0.97 -Repeat CMP in AM  8. Iron Deficiency Anemia.  -Hemoglobin 11.4 on  admission. Hb/Hct Dropped to 9.7/30.4. ?Dilutional Drop but now has Improved back to 10.6/33.0 -Continue home meds  9. Anxiety. -Patient appears slightly anxious. -Continue home meds of Clonazepam 0.25 mg po BID  10. Leukocytosis, improved -Mild as WBC went from 11.7 -> 14.8 -> 12.9 -> 9.8 -Likely from IV Steroid Demargination -Continue to Monitor for S/Sx of Infection  DVT prophylaxis: Enoxaparin 30 mg sq q24 Code Status: FULL CODE Family Communication: Discussed with Husband at bedside Disposition Plan: Remain in SDU and transfer to floor if Stable in AM not requiring BiPAP  Consultants:   Palliative Care Medicine  Pulmonary    Procedures:  ECHOCARDIOGRAM  - Left ventricle: The cavity size was normal. There was mild focal   basal hypertrophy of the septum. Systolic function was normal.   The estimated ejection fraction was in the range of 55% to 60%.   Wall motion was normal; there were no regional wall motion   abnormalities. Doppler parameters are consistent with abnormal   left ventricular relaxation (grade 1 diastolic dysfunction).   Doppler parameters are consistent with indeterminate ventricular   filling pressure. - Aortic valve: Transvalvular velocity was within the normal range.   There was no stenosis. There was no regurgitation. - Mitral valve: Moderately calcified annulus. Transvalvular   velocity was within the normal range. There was no evidence for   stenosis. There was mild regurgitation. - Right ventricle: The cavity size was normal. Wall thickness was   normal. Systolic function was normal. - Atrial septum: No defect or patent foramen ovale was identified. - Tricuspid valve: There was no regurgitation.   Antimicrobials: Anti-infectives  Start     Dose/Rate Route Frequency Ordered Stop   03/10/17 2200  doxycycline (VIBRA-TABS) tablet 100 mg     100 mg Oral Every 12 hours 03/10/17 0940     03/10/17 1000  doxycycline (VIBRAMYCIN) 100 mg in  dextrose 5 % 250 mL IVPB     100 mg 125 mL/hr over 120 Minutes Intravenous Every 12 hours 03/10/17 0940 03/10/17 1156   03/09/17 1000  doxycycline (VIBRAMYCIN) 100 mg in dextrose 5 % 250 mL IVPB  Status:  Discontinued     100 mg 125 mL/hr over 120 Minutes Intravenous Every 12 hours 03/09/17 0922 03/10/17 0940   03/07/17 0800  levofloxacin (LEVAQUIN) tablet 500 mg  Status:  Discontinued     500 mg Oral Every 48 hours 03/07/17 0707 03/09/17 0922     Subjective: Seen and examined and stated she had a rough morning but feeling better now. States morphine initially helped. No nausea or vomiting. No other concerns or complaints at this time.     Objective: Vitals:   03/11/17 0025 03/11/17 0430 03/11/17 0754 03/11/17 0800  BP: 131/77 115/61  (!) 144/79  Pulse: 77 69  69  Resp: 20 (!) 28  (!) 24  Temp: 98.1 F (36.7 C) 98.7 F (37.1 C)  97.7 F (36.5 C)  TempSrc: Oral Oral  Oral  SpO2: 100% 100% 100% 100%  Weight:  56.9 kg (125 lb 7.1 oz)    Height:        Intake/Output Summary (Last 24 hours) at 03/11/17 0814 Last data filed at 03/11/17 0443  Gross per 24 hour  Intake                0 ml  Output             1150 ml  Net            -1150 ml   Filed Weights   03/09/17 0401 03/10/17 0539 03/11/17 0430  Weight: 56.6 kg (124 lb 11.2 oz) 56.2 kg (123 lb 14.4 oz) 56.9 kg (125 lb 7.1 oz)   Examination: Physical Exam:  Constitutional: Pleasant Caucasian female who still feels dyspneic  Eyes: Sclerae anicteric. Conjunctivae non-injected.  ENMT: Grossly normal hearing. Mucous membranes appear moist Neck: Supple with no visible JVD Respiratory: Diminished but no wheezing/rales/rhonchi. Patient was again using pursed lip breathing and was wearing supplemental O2 via [place].  Cardiovascular: RRR; S1 S2; No appreciable LE edema Abdomen: Soft, NT, ND. Bowel sounds present GU: Deferred Musculoskeletal: No contractures; No cyanosis Skin: Warm and Dry. No rashes or lesions on a limited skin  eval Neurologic: CN 2-12 grossly intact. No appreciable focal deficits Psychiatric: Pleasant mood and affect. Intact judgement and insight. Awake and Ox3  Data Reviewed: I have personally reviewed following labs and imaging studies  CBC:  Recent Labs Lab 03/07/17 0335 03/08/17 0357 03/09/17 0315 03/10/17 0247 03/11/17 0533  WBC 10.2 11.7* 14.8* 12.9* 9.8  NEUTROABS 5.4  --  13.4* 11.6* 8.4*  HGB 11.4* 9.7* 9.7* 10.2* 10.6*  HCT 35.0* 29.5* 30.4* 32.0* 33.0*  MCV 94.3 93.4 94.1 94.7 93.0  PLT 375 332 346 340 341   Basic Metabolic Panel:  Recent Labs Lab 03/07/17 0335 03/08/17 0357 03/09/17 0315 03/10/17 0247 03/11/17 0533  NA 139 136 140 140 140  K 4.2 4.7 4.2 3.9 4.1  CL 99* 97* 100* 100* 100*  CO2 27 29 32 32 30  GLUCOSE 187* 144* 140* 133* 136*  BUN 32* 31* 34*  40* 41*  CREATININE 1.26* 0.94 1.03* 1.12* 0.97  CALCIUM 9.2 8.9 8.3* 8.5* 8.7*  MG  --   --  2.4 2.3 2.5*  PHOS  --   --  4.1 3.9 3.7   GFR: Estimated Creatinine Clearance: 35.5 mL/min (by C-G formula based on SCr of 0.97 mg/dL). Liver Function Tests:  Recent Labs Lab 03/09/17 0315 03/10/17 0247 03/11/17 0533  AST 25 23 17   ALT 21 22 21   ALKPHOS 35* 34* 31*  BILITOT 0.5 0.5 0.3  PROT 5.9* 6.0* 5.7*  ALBUMIN 3.4* 3.3* 3.3*   No results for input(s): LIPASE, AMYLASE in the last 168 hours. No results for input(s): AMMONIA in the last 168 hours. Coagulation Profile: No results for input(s): INR, PROTIME in the last 168 hours. Cardiac Enzymes:  Recent Labs Lab 03/07/17 0335  TROPONINI <0.03   BNP (last 3 results)  Recent Labs  07/15/16 1542 01/26/17 1137  PROBNP 90 91   HbA1C: No results for input(s): HGBA1C in the last 72 hours. CBG:  Recent Labs Lab 03/10/17 0637 03/10/17 1156 03/10/17 1721 03/10/17 2142 03/11/17 0557  GLUCAP 132* 142* 129* 142* 127*   Lipid Profile: No results for input(s): CHOL, HDL, LDLCALC, TRIG, CHOLHDL, LDLDIRECT in the last 72 hours. Thyroid  Function Tests: No results for input(s): TSH, T4TOTAL, FREET4, T3FREE, THYROIDAB in the last 72 hours. Anemia Panel: No results for input(s): VITAMINB12, FOLATE, FERRITIN, TIBC, IRON, RETICCTPCT in the last 72 hours. Sepsis Labs: No results for input(s): PROCALCITON, LATICACIDVEN in the last 168 hours.  Recent Results (from the past 240 hour(s))  Respiratory Panel by PCR     Status: None   Collection Time: 03/10/17  3:48 PM  Result Value Ref Range Status   Adenovirus NOT DETECTED NOT DETECTED Final   Coronavirus 229E NOT DETECTED NOT DETECTED Final   Coronavirus HKU1 NOT DETECTED NOT DETECTED Final   Coronavirus NL63 NOT DETECTED NOT DETECTED Final   Coronavirus OC43 NOT DETECTED NOT DETECTED Final   Metapneumovirus NOT DETECTED NOT DETECTED Final   Rhinovirus / Enterovirus NOT DETECTED NOT DETECTED Final   Influenza A NOT DETECTED NOT DETECTED Final   Influenza B NOT DETECTED NOT DETECTED Final   Parainfluenza Virus 1 NOT DETECTED NOT DETECTED Final   Parainfluenza Virus 2 NOT DETECTED NOT DETECTED Final   Parainfluenza Virus 3 NOT DETECTED NOT DETECTED Final   Parainfluenza Virus 4 NOT DETECTED NOT DETECTED Final   Respiratory Syncytial Virus NOT DETECTED NOT DETECTED Final   Bordetella pertussis NOT DETECTED NOT DETECTED Final   Chlamydophila pneumoniae NOT DETECTED NOT DETECTED Final   Mycoplasma pneumoniae NOT DETECTED NOT DETECTED Final    Radiology Studies: Dg Chest Port 1 View  Result Date: 03/09/2017 CLINICAL DATA:  Shortness of Breath EXAM: PORTABLE CHEST 1 VIEW COMPARISON:  03/07/2017 FINDINGS: There is hyperinflation of the lungs compatible with COPD. Heart and mediastinal contours are within normal limits. Scarring in the right upper lobe. Otherwise, no focal opacities or effusions. No acute bony abnormality. IMPRESSION: COPD.  No active disease. Electronically Signed   By: Charlett Nose M.D.   On: 03/09/2017 10:30   Scheduled Meds: . aspirin  81 mg Oral Daily  .  atorvastatin  20 mg Oral Daily  . calcium-vitamin D  1 tablet Oral Q breakfast  . doxycycline  100 mg Oral Q12H  . enoxaparin (LOVENOX) injection  30 mg Subcutaneous Q24H  . furosemide  40 mg Oral Daily  . guaiFENesin  1,200 mg Oral  BID  . insulin aspart  0-9 Units Subcutaneous TID WC  . losartan  50 mg Oral Daily  . magnesium gluconate  500 mg Oral Daily  . methylPREDNISolone (SOLU-MEDROL) injection  40 mg Intravenous Q6H  . mometasone-formoterol  2 puff Inhalation BID  . nebivolol  5 mg Oral Daily  . polyethylene glycol  17 g Oral BID  . tiotropium  18 mcg Inhalation Daily   Continuous Infusions:   LOS: 4 days   Merlene Laughter, DO [facility] Pager [phone]  If 7PM-7AM, please contact night-coverage [email or web address] Password [email or web address] 03/11/2017, 8:14 AM

## 2017-03-11 NOTE — Progress Notes (Addendum)
PULMONARY / CRITICAL CARE MEDICINE   Name: [name] MRN: [id number] DOB: 09/12/1934    ADMISSION DATE:  03/07/2017 CONSULTATION DATE:  03/10/2017  REFERRING MD:  Dr. Tiffany Kocher  CHIEF COMPLAINT:  Short or breath  HISTORY OF PRESENT ILLNESS:   82 yo female brought to ER with dyspnea.  This was associated with cough, and leg swelling.  She needed Bipap in ER.  She was felt to have COPD exacerbation.  She is normally followed in pulmonary office by Dr. Delton Coombes for GOLD 4D COPD with emphysema and chronic respiratory failure.  I saw her on 02/08/17 for COPD exacerbation.  She felt better when she was on prednisone.  She is having cough with yellow sputum.  She was having wheeze, but feels this is better.  Still tight in her chest.  Feels that Bipap helps.  Had a episode of dyspnea today AM, feels better now.   PAST MEDICAL HISTORY :  She  has a past medical history of Actinomycosis, cervicofacial; CAD (coronary artery disease); COPD (chronic obstructive pulmonary disease) (HCC); Emphysema; HTN (hypertension); echocardiogram; Hyperlipidemia; and PVD (peripheral vascular disease) (HCC).  PAST SURGICAL HISTORY: She  has a past surgical history that includes Appendectomy; Tonsillectomy; and Bone graft hip iliac crest.  Allergies  Allergen Reactions  . Levaquin [Levofloxacin In D5w] Other (See Comments)    Can't sleep   . Penicillins Hives    Has taken it since.  . Pneumococcal Vaccine Swelling    Severe localized swelling in arm  . Pneumovax [Pneumococcal Polysaccharide Vaccine] Hives    No current facility-administered medications on file prior to encounter.    Current Outpatient Prescriptions on File Prior to Encounter  Medication Sig  . albuterol (PROVENTIL HFA;VENTOLIN HFA) 108 (90 BASE) MCG/ACT inhaler Inhale 2 puffs into the lungs every 6 (six) hours as needed. For shortness of breath  . aspirin 81 MG tablet Take 81 mg by mouth daily.    Marland Kitchen aspirin-acetaminophen-caffeine (EXCEDRIN  MIGRAINE) 250-250-65 MG tablet Take 1 tablet by mouth every 6 (six) hours as needed for headache.  Marland Kitchen atorvastatin (LIPITOR) 20 MG tablet Take 1 tablet (20 mg total) by mouth daily.  . budesonide-formoterol (SYMBICORT) 160-4.5 MCG/ACT inhaler Inhale 2 puffs into the lungs 2 (two) times daily.  Marland Kitchen BYSTOLIC 5 MG tablet TAKE 1 TABLET (5 MG TOTAL) BY MOUTH DAILY.  . Calcium Carb-Cholecalciferol (CALCIUM 600 + D PO) Take 1 tablet by mouth daily.  . clonazePAM (KLONOPIN) 0.5 MG tablet Take 0.5 tablets (0.25 mg total) by mouth 2 (two) times daily.  . furosemide (LASIX) 40 MG tablet Take 1 tablet (40 mg total) by mouth 2 (two) times daily.  Marland Kitchen losartan (COZAAR) 50 MG tablet Take 1 tablet (50 mg total) by mouth daily.  . Magnesium 500 MG CAPS Take 1 capsule by mouth daily.  . Multiple Vitamin (MULTIVITAMIN) tablet Take 1 tablet by mouth daily.    Marland Kitchen SPIRIVA RESPIMAT 2.5 MCG/ACT AERS INHALE 2 PUFFS INTO THE LUNGS DAILY.  Marland Kitchen UNABLE TO FIND Inhale 1.5-3 L into the lungs continuous.     FAMILY HISTORY:  Her indicated that her mother is deceased. She indicated that her father is deceased. She indicated that three of her seven sisters are deceased. She indicated that the status of her brother is unknown. She indicated that her maternal grandmother is deceased. She indicated that her maternal grandfather is deceased. She indicated that her paternal grandmother is deceased. She indicated that her paternal grandfather is deceased. She indicated that  the status of her unknown relative is unknown.    SOCIAL HISTORY: She  reports that she quit smoking about 11 years ago. Her smoking use included Cigarettes. She has a 49.00 pack-year smoking history. She has never used smokeless tobacco. She reports that she does not drink alcohol or use drugs.  REVIEW OF SYSTEMS:   12 point negative except above  VITAL SIGNS: BP (!) 144/79 (BP Location: Right Arm)   Pulse 69   Temp 97.7 F (36.5 C) (Oral)   Resp (!) 24   Ht 5'  1" (1.549 m)   Wt 125 lb 7.1 oz (56.9 kg)   SpO2 100%   BMI 23.70 kg/m   HEMODYNAMICS:    VENTILATOR SETTINGS:    INTAKE / OUTPUT: I/O last 3 completed shifts: In: 250 [IV Piggyback:250] Out: 2250 [Urine:2250]  PHYSICAL EXAMINATION: Gen:      No acute distress HEENT:  EOMI, sclera anicteric Neck:     No masses; no thyromegaly Lungs:    Mild exp wheeze; normal respiratory effort CV:         Regular rate and rhythm; no murmurs Abd:      + bowel sounds; soft, non-tender; no palpable masses, no distension Ext:    No edema; adequate peripheral perfusion Skin:      Warm and dry; no rash Neuro: alert and oriented x 3 Psych: normal mood and affect  LABS:  BMET  Recent Labs Lab 03/09/17 0315 03/10/17 0247 03/11/17 0533  NA 140 140 140  K 4.2 3.9 4.1  CL 100* 100* 100*  CO2 32 32 30  BUN 34* 40* 41*  CREATININE 1.03* 1.12* 0.97  GLUCOSE 140* 133* 136*    Electrolytes  Recent Labs Lab 03/09/17 0315 03/10/17 0247 03/11/17 0533  CALCIUM 8.3* 8.5* 8.7*  MG 2.4 2.3 2.5*  PHOS 4.1 3.9 3.7    CBC  Recent Labs Lab 03/09/17 0315 03/10/17 0247 03/11/17 0533  WBC 14.8* 12.9* 9.8  HGB 9.7* 10.2* 10.6*  HCT 30.4* 32.0* 33.0*  PLT 346 340 341    Coag's No results for input(s): APTT, INR in the last 168 hours.  Sepsis Markers No results for input(s): LATICACIDVEN, PROCALCITON, O2SATVEN in the last 168 hours.  ABG  Recent Labs Lab 03/07/17 0439 03/07/17 1642  PHART 7.383 7.400  PCO2ART 52.7* 47.6  PO2ART 167.0* 112.0*    Liver Enzymes  Recent Labs Lab 03/09/17 0315 03/10/17 0247 03/11/17 0533  AST 25 23 17   ALT 21 22 21   ALKPHOS 35* 34* 31*  BILITOT 0.5 0.5 0.3  ALBUMIN 3.4* 3.3* 3.3*    Cardiac Enzymes  Recent Labs Lab 03/07/17 0335  TROPONINI <0.03    Glucose  Recent Labs Lab 03/09/17 2128 03/10/17 0637 03/10/17 1156 03/10/17 1721 03/10/17 2142 03/11/17 0557  GLUCAP 146* 132* 142* 129* 142* 127*    Imaging No results  found.   STUDIES:  Echo 8/30 >> EF 55 to 60%, grade 1 DD, mild MR CXR 9/1 > Hyperinflation. No acute changes. I have reviewed the images personally  CULTURES: Respiratory viral panel >> Neg  ANTIBIOTICS: Levaquin 8/30 >> 8/30 Doxycycline 8/30 >>   SIGNIFICANT EVENTS: 8/28 Admit 8/29 Palliative care consult >> partial code  ASSESSMENT / PLAN: COPD exacerbation. Acute on chronic hypoxic, hypercapnic respiratory failure. Hx of GOLD D 4 COPD with emphysema.  Feels inhalers work better than nebulizer tx- Continue dulera and spiriva Continue solumedrol to 40 mg q6h. Can change to 40 mg q 8 tomorrow.  She will need a slow taper.  PRN albuterol Bipap as needed and during the night Started on PRN morphine per palliative care. She is not sure if it is making the dyspnea worse. Will continue to monitor.  Oxygen to keep SpO2 90 to 95% Continue doxy day 3  PCCM will be available as needed over the long weekend. Please call with questions.  Chilton Greathouse MD [facility] Pager [phone] If no answer or after 3pm call: [phone] 03/11/2017, 8:53 AM

## 2017-03-12 DIAGNOSIS — K5900 Constipation, unspecified: Secondary | ICD-10-CM

## 2017-03-12 DIAGNOSIS — R0602 Shortness of breath: Secondary | ICD-10-CM

## 2017-03-12 LAB — CBC WITH DIFFERENTIAL/PLATELET
BASOS ABS: 0 10*3/uL (ref 0.0–0.1)
Basophils Relative: 0 %
EOS PCT: 0 %
Eosinophils Absolute: 0 10*3/uL (ref 0.0–0.7)
HCT: 32.3 % — ABNORMAL LOW (ref 36.0–46.0)
Hemoglobin: 10.5 g/dL — ABNORMAL LOW (ref 12.0–15.0)
LYMPHS ABS: 0.8 10*3/uL (ref 0.7–4.0)
LYMPHS PCT: 8 %
MCH: 30.3 pg (ref 26.0–34.0)
MCHC: 32.5 g/dL (ref 30.0–36.0)
MCV: 93.4 fL (ref 78.0–100.0)
MONO ABS: 0.8 10*3/uL (ref 0.1–1.0)
MONOS PCT: 8 %
Neutro Abs: 8 10*3/uL — ABNORMAL HIGH (ref 1.7–7.7)
Neutrophils Relative %: 84 %
PLATELETS: 344 10*3/uL (ref 150–400)
RBC: 3.46 MIL/uL — ABNORMAL LOW (ref 3.87–5.11)
RDW: 12.6 % (ref 11.5–15.5)
WBC: 9.6 10*3/uL (ref 4.0–10.5)

## 2017-03-12 LAB — COMPREHENSIVE METABOLIC PANEL
ALT: 22 U/L (ref 14–54)
ANION GAP: 8 (ref 5–15)
AST: 20 U/L (ref 15–41)
Albumin: 3.3 g/dL — ABNORMAL LOW (ref 3.5–5.0)
Alkaline Phosphatase: 30 U/L — ABNORMAL LOW (ref 38–126)
BILIRUBIN TOTAL: 0.4 mg/dL (ref 0.3–1.2)
BUN: 43 mg/dL — ABNORMAL HIGH (ref 6–20)
CHLORIDE: 99 mmol/L — AB (ref 101–111)
CO2: 33 mmol/L — ABNORMAL HIGH (ref 22–32)
Calcium: 8.8 mg/dL — ABNORMAL LOW (ref 8.9–10.3)
Creatinine, Ser: 0.94 mg/dL (ref 0.44–1.00)
GFR calc Af Amer: >60 mL/min (ref >60)
GFR, EST NON AFRICAN AMERICAN: 56 mL/min — AB (ref >60)
Glucose, Bld: 122 mg/dL — ABNORMAL HIGH (ref 65–99)
POTASSIUM: 4.2 mmol/L (ref 3.5–5.1)
Sodium: 140 mmol/L (ref 135–145)
TOTAL PROTEIN: 5.5 g/dL — AB (ref 6.5–8.1)

## 2017-03-12 LAB — GLUCOSE, CAPILLARY
GLUCOSE-CAPILLARY: 135 mg/dL — AB (ref 65–99)
Glucose-Capillary: 137 mg/dL — ABNORMAL HIGH (ref 65–99)
Glucose-Capillary: 88 mg/dL (ref 65–99)
Glucose-Capillary: 121 mg/dL — ABNORMAL HIGH (ref 65–99)

## 2017-03-12 LAB — PHOSPHORUS: PHOSPHORUS: 3.4 mg/dL (ref 2.5–4.6)

## 2017-03-12 LAB — MAGNESIUM: MAGNESIUM: 2.4 mg/dL (ref 1.7–2.4)

## 2017-03-12 MED ORDER — METHYLPREDNISOLONE SODIUM SUCC 40 MG IJ SOLR
40.0000 mg | Freq: Three times a day (TID) | INTRAMUSCULAR | Status: DC
  Administered 2017-03-12 – 2017-03-13 (×4): 40 mg via INTRAVENOUS
  Filled 2017-03-12 (×5): qty 1

## 2017-03-12 MED ORDER — SENNOSIDES-DOCUSATE SODIUM 8.6-50 MG PO TABS
2.0000 | ORAL_TABLET | Freq: Two times a day (BID) | ORAL | Status: DC
  Administered 2017-03-12 – 2017-03-14 (×4): 2 via ORAL
  Filled 2017-03-12 (×4): qty 2

## 2017-03-12 NOTE — Progress Notes (Signed)
PROGRESS NOTE    [name]  ZOX:[id number] DOB: 10-17-1934 DOA: 03/07/2017 PCP: Sheri Penna, MD  Brief Narrative:  [name] is a pleasant 82 y.o. female with medical history significant for hypoxic respiratory failure on home oxygen related to COPD, diastolic dysfunction, hypertension, CAD, anemia, anxiety presents to the emergency department with the chief complaint of worsening shortness of breath. Initial evaluation reveals acute on chronic respiratory failure likely related to COPD exacerbation. She stated that over the last week she's experience worsening shortness of breath. She is also had intermittent productive cough and mild increase in lower extremity edema. She denies chest pain palpitations headache dizziness syncope or near-syncope. She denies any abdominal pain nausea vomiting diarrhea constipation melena or bright red blood per rectum. She saw her neurologist 3 weeks ago she was placed on prednisone which she's completed. She also reports she saw a cardiologist around the same time increase her Lasix from 20-40 mg. She was admitted for Acute on Chronic Hypoxic Respiratory Failure from COPD Exacerbation. Palliative Care Medicine and Pulmonary have been consulted for further evaluation and management. Patient slowly improving. Will get PT Re-evaluation as patient feeling weaker.   Assessment & Plan:   Principal Problem:   Acute on chronic respiratory failure (HCC) Active Problems:   HTN (hypertension)   CAD (coronary artery disease), native coronary artery   HLD (hyperlipidemia)   Hypertrophic cardiomyopathy (HCC)   Anxiety   Iron deficiency anemia   Tachycardia   Diastolic dysfunction   COPD with acute exacerbation (HCC)   Hyperglycemia   Goals of care, counseling/discussion   Palliative care by specialist   Palliative care encounter  1. Acute on Chronic respiratory failure requiring NIPPV likely related to COPD with acute exacerbation. Now weaned to [employer] and  3 Liters  -Patient is on home oxygen at 2-3 L 24/ 7.  -Initial Chest x-ray reveals hyperinflation with emphysematous changes and scarring in the right lung. No infiltrate.  -EKG was tachycardic at 101 with atrial premature complex right atrial enlargement.  -Time of admission she was on BiPAP with moderate increased work of breathing and oxygen saturation level 88% on 3 L nasal cannula; C/w BiPAP qHS and Prn -Goal is to Maintain O2 Saturations 90-95% -Admitted to Step Down -Scheduled Ipratropium 0.5 mg q6h with Levalbuterol 0.63 q6h now D/C'd by Pulmonary. C/w Albuterol 2.5 mg Neb q2hprn wheezing -C/w Dulera and Spiriva -IV Methylprednisolone 40 mg IV q6h by Pulmonary and changed to 40 mg q8h this AM; Started PPI given Long term Steroid use -Levaquin 500 mg po changed to IV Doxycyxline; Patient is Day 4 on Doxycyline -Monitor respiratory effort and oxygen saturation level and put back on BiPAP as indicated -Added Flutter Valve and increased Guaifenesin.  -Checked ECHOcardiogram and showed Systolic function was normal. The estimated ejection fraction was in the range of 55% to 60%. Wall motion was normal; there were no regional wall motion abnormalities. Doppler parameters are consistent with abnormal left ventricular relaxation (grade 1 diastolic dysfunction). -Pulmonary following and appreciate evaluation and recommendations   2. COPD with Acute Exacerbation.  -Chart review indicates patient has had worsening shortness of breath over the last couple of weeks.  -Saw her pulmonology M.D. 3 weeks ago. Office note indicates she was given a course of prednisone, antibiotics were deferred at that time.  -Office note also indicates considering changing to nebulized LAMA, LABA and ICS. In addition office note indicates patient may benefit from palliative care assessment -Nebulizers as noted above -Continue [facility]  substitution; Spiriva restarted by Pulmonary' -C/w Morphine Concentrate 2.6-5 mg SL  q2hprn for SOB -C/w Magnesium Gluconate 500 mg po Daily -Solu-Medrol as above -Checked Respiratory Virus Panel and was Negative and Checked ECHO which showed EF of 55-60% with G1DD -Pulmonary consulted for further evaluation and management -Repeat CXR 8/30 showed COPD  -Repeat CXR on 9/1 showed slight hyperinflation, no acute cardiopulmonary disease and aortic atherosclerosis  -Palliative care consulted for Goals of Care and Psychospiritual Support; C/w Current care but no escalation in Care -PT/OT to Evaluate and Treat; Initially recommending Home Health PT but patient feels weaker and will get Re-evaluation   3. Tachycardia/CAD.  -Likely related to nebs. Mild.; Nebs now D/C'd -Initial troponin negative.  -Patient denies chest pain.  -Review indicates recent Myoview low risk -Monitor on telemetry -Continue home meds and Home Nebivolol   4. Hyperglycemia.  -Likely steroid-induced. CBG's ranging from 135-159 -Hemoglobin A1c was 5.6 -C/w Sensitive Novolog Sliding scale insulin AC  5. Hypertension.  -Home medications include Bystolic, Lasix, Cozaar -Continue Home Meds; Lasix Dose 40 mg po Daily instead of BID -Continue to Monitor  6. Diastolic dysfunction/Hypertrophic Cardiomyopathy.  -Appears stable at baseline  -Chart review indicates office visit with cardiology 2 weeks ago. Echo in March 2017 showed vigorous LVEF with an EF of 65% mild grade 1 diastolic dysfunction. -Repeat ECHOCARDIOGRAM as below -Daily weights -Monitor intake and output; Patient is -2.870 Liters -Held Lasix initially but now restarted  -Monitor  7. Acute Kidney Injury.  -Creatinine 1.26 on Admission. Likely related decreased oral intake in the setting of Lasix and Cozaar. -IVF D/C'd and Lasix restarted; Changed po Lasix 40 mg BID to 40 mg Daily because of worsening Cr yesterday and Cr improved  -BUN/Cr went from 31/0.94 -> 34/1.03 -> 40/1.12 -> 41/0.97 -> 43/0.94 -Repeat CMP in AM  8. Iron  Deficiency Anemia.  -Hemoglobin 11.4 on admission. Hb/Hct Dropped to 9.7/30.4. ?Dilutional Drop but now has Improved back to 10.5/32.3 -Continue home meds  9. Anxiety. -Patient appears slightly anxious. -Continue home meds of Clonazepam 0.25 mg po BID  10. Leukocytosis, improved -Mild as WBC went from 11.7 -> 14.8 -> 12.9 -> 9.8 -> 9.6 -Likely from IV Steroid Demargination -Continue to Monitor for S/Sx of Infection  11. Constipation  -Has not had a BM since Tuesday -C/w Miralax 17 grams po BID and Senna-Docusate 2 Tab po BID -If not improving will try Bisacodyl Suppository   12. BUN/Cr Dissociation -Steroid mediated as BUN 43 and Cr 0.95 -Continue to Monitor   DVT prophylaxis: Enoxaparin 30 mg sq q24 Code Status: FULL CODE Family Communication: Discussed with Husband at bedside Disposition Plan: Anticipate D/C within next 24-48 hours. PT recommending Home Health PT but will get re-evaluation as patient is feeling weaker.   Consultants:   Palliative Care Medicine  Pulmonary    Procedures:  ECHOCARDIOGRAM  - Left ventricle: The cavity size was normal. There was mild focal   basal hypertrophy of the septum. Systolic function was normal.   The estimated ejection fraction was in the range of 55% to 60%.   Wall motion was normal; there were no regional wall motion   abnormalities. Doppler parameters are consistent with abnormal   left ventricular relaxation (grade 1 diastolic dysfunction).   Doppler parameters are consistent with indeterminate ventricular   filling pressure. - Aortic valve: Transvalvular velocity was within the normal range.   There was no stenosis. There was no regurgitation. - Mitral valve: Moderately calcified annulus. Transvalvular   velocity  was within the normal range. There was no evidence for   stenosis. There was mild regurgitation. - Right ventricle: The cavity size was normal. Wall thickness was   normal. Systolic function was normal. -  Atrial septum: No defect or patent foramen ovale was identified. - Tricuspid valve: There was no regurgitation.   Antimicrobials: Anti-infectives    Start     Dose/Rate Route Frequency Ordered Stop   03/10/17 2200  doxycycline (VIBRA-TABS) tablet 100 mg     100 mg Oral Every 12 hours 03/10/17 0940     03/10/17 1000  doxycycline (VIBRAMYCIN) 100 mg in dextrose 5 % 250 mL IVPB     100 mg 125 mL/hr over 120 Minutes Intravenous Every 12 hours 03/10/17 0940 03/10/17 1156   03/09/17 1000  doxycycline (VIBRAMYCIN) 100 mg in dextrose 5 % 250 mL IVPB  Status:  Discontinued     100 mg 125 mL/hr over 120 Minutes Intravenous Every 12 hours 03/09/17 0922 03/10/17 0940   03/07/17 0800  levofloxacin (LEVAQUIN) tablet 500 mg  Status:  Discontinued     500 mg Oral Every 48 hours 03/07/17 0707 03/09/17 [id number]     Subjective: Seen and examined and stated she was feeling a little bit better and thinks morphine helps. No nausea or vomiting. States she had wheezing this AM. No other concerns or complaints at this time.   Objective: Vitals:   03/11/17 1406 03/11/17 2112 03/12/17 0012 03/12/17 0421  BP: (!) 147/73 (!) 145/79 (!) 117/55 116/69  Pulse: 64  (!) 58 69  Resp: 18  18 (!) 21  Temp: 97.8 F (36.6 C) 97.6 F (36.4 C) 98.9 F (37.2 C) 98.3 F (36.8 C)  TempSrc: Oral Oral Oral Oral  SpO2: 98%  100% 100%  Weight:    57 kg (125 lb 11.2 oz)  Height:        Intake/Output Summary (Last 24 hours) at 03/12/17 0820 Last data filed at 03/11/17 1600  Gross per 24 hour  Intake                0 ml  Output              500 ml  Net             -500 ml   Filed Weights   03/10/17 0539 03/11/17 0430 03/12/17 0421  Weight: 56.2 kg (123 lb 14.4 oz) 56.9 kg (125 lb 7.1 oz) 57 kg (125 lb 11.2 oz)   Examination: Physical Exam:  Constitutional: Pleasant Caucasian female in NAD sitting bedside about to take medications. Eyes: Sclerae anicteric; Conjunctivae non-injected ENMT: Grossly normal hearing.  External ears and nose appear normal. Mucous membranes are moist Neck: Supple with no JVD Respiratory: Diminished bilaterally but no appreciable wheezing/rales/rhonchi. Patient was slightly tachypenic. Wearing supplemental O2 via [place] Cardiovascular: RRR, S1, S2; No LE edema Abdomen: Soft, NT, ND. Bowel sounds present GU: Deferred Musculoskeletal: No contractures; No cyanosis Skin: Warm and Dry. No rashes appreciated Neurologic: CN 2-12 grossly intact. No appreciable focal deficits Psychiatric: Pleasant mood and affect. Intact judgement and insight  Data Reviewed: I have personally reviewed following labs and imaging studies  CBC:  Recent Labs Lab 03/07/17 0335 03/08/17 0357 03/09/17 0315 03/10/17 0247 03/11/17 0533 03/12/17 0254  WBC 10.2 11.7* 14.8* 12.9* 9.8 9.6  NEUTROABS 5.4  --  13.4* 11.6* 8.4* 8.0*  HGB 11.4* 9.7* 9.7* 10.2* 10.6* 10.5*  HCT 35.0* 29.5* 30.4* 32.0* 33.0* 32.3*  MCV 94.3 93.4 94.1  94.7 93.0 93.4  PLT 375 332 346 340 341 344   Basic Metabolic Panel:  Recent Labs Lab 03/08/17 0357 03/09/17 0315 03/10/17 0247 03/11/17 0533 03/12/17 0254  NA 136 140 140 140 140  K 4.7 4.2 3.9 4.1 4.2  CL 97* 100* 100* 100* 99*  CO2 29 32 32 30 33*  GLUCOSE 144* 140* 133* 136* 122*  BUN 31* 34* 40* 41* 43*  CREATININE 0.94 1.03* 1.12* 0.97 0.94  CALCIUM 8.9 8.3* 8.5* 8.7* 8.8*  MG  --  2.4 2.3 2.5* 2.4  PHOS  --  4.1 3.9 3.7 3.4   GFR: Estimated Creatinine Clearance: 36.6 mL/min (by C-G formula based on SCr of 0.94 mg/dL). Liver Function Tests:  Recent Labs Lab 03/09/17 0315 03/10/17 0247 03/11/17 0533 03/12/17 0254  AST 25 23 17 20   ALT 21 22 21 22   ALKPHOS 35* 34* 31* 30*  BILITOT 0.5 0.5 0.3 0.4  PROT 5.9* 6.0* 5.7* 5.5*  ALBUMIN 3.4* 3.3* 3.3* 3.3*   No results for input(s): LIPASE, AMYLASE in the last 168 hours. No results for input(s): AMMONIA in the last 168 hours. Coagulation Profile: No results for input(s): INR, PROTIME in the last 168  hours. Cardiac Enzymes:  Recent Labs Lab 03/07/17 0335  TROPONINI <0.03   BNP (last 3 results)  Recent Labs  07/15/16 1542 01/26/17 1137  PROBNP 90 91   HbA1C: No results for input(s): HGBA1C in the last 72 hours. CBG:  Recent Labs Lab 03/11/17 0557 03/11/17 1117 03/11/17 1643 03/11/17 2109 03/12/17 0629  GLUCAP 127* 148* 133* 159* 135*   Lipid Profile: No results for input(s): CHOL, HDL, LDLCALC, TRIG, CHOLHDL, LDLDIRECT in the last 72 hours. Thyroid Function Tests: No results for input(s): TSH, T4TOTAL, FREET4, T3FREE, THYROIDAB in the last 72 hours. Anemia Panel: No results for input(s): VITAMINB12, FOLATE, FERRITIN, TIBC, IRON, RETICCTPCT in the last 72 hours. Sepsis Labs: No results for input(s): PROCALCITON, LATICACIDVEN in the last 168 hours.  Recent Results (from the past 240 hour(s))  Respiratory Panel by PCR     Status: None   Collection Time: 03/10/17  3:48 PM  Result Value Ref Range Status   Adenovirus NOT DETECTED NOT DETECTED Final   Coronavirus 229E NOT DETECTED NOT DETECTED Final   Coronavirus HKU1 NOT DETECTED NOT DETECTED Final   Coronavirus NL63 NOT DETECTED NOT DETECTED Final   Coronavirus OC43 NOT DETECTED NOT DETECTED Final   Metapneumovirus NOT DETECTED NOT DETECTED Final   Rhinovirus / Enterovirus NOT DETECTED NOT DETECTED Final   Influenza A NOT DETECTED NOT DETECTED Final   Influenza B NOT DETECTED NOT DETECTED Final   Parainfluenza Virus 1 NOT DETECTED NOT DETECTED Final   Parainfluenza Virus 2 NOT DETECTED NOT DETECTED Final   Parainfluenza Virus 3 NOT DETECTED NOT DETECTED Final   Parainfluenza Virus 4 NOT DETECTED NOT DETECTED Final   Respiratory Syncytial Virus NOT DETECTED NOT DETECTED Final   Bordetella pertussis NOT DETECTED NOT DETECTED Final   Chlamydophila pneumoniae NOT DETECTED NOT DETECTED Final   Mycoplasma pneumoniae NOT DETECTED NOT DETECTED Final    Radiology Studies: Dg Chest Port 1 View  Result Date:  03/11/2017 CLINICAL DATA:  Shortness of breath and COPD. EXAM: PORTABLE CHEST 1 VIEW COMPARISON:  One-view chest x-ray 03/09/2017. FINDINGS: Heart size is normal. Atherosclerotic changes are noted at the aortic arch. Lungs are hyperinflated. There is no edema or effusion. No focal airspace disease is present. The visualized soft tissues and bony thorax are unremarkable.  IMPRESSION: 1. Stable hyperinflation. 2. No acute cardiopulmonary disease. 3. Aortic atherosclerosis. Electronically Signed   By: Marin Roberts M.D.   On: 03/11/2017 09:45   Scheduled Meds: . aspirin  81 mg Oral Daily  . atorvastatin  20 mg Oral Daily  . calcium-vitamin D  1 tablet Oral Q breakfast  . doxycycline  100 mg Oral Q12H  . enoxaparin (LOVENOX) injection  40 mg Subcutaneous Q24H  . furosemide  40 mg Oral Daily  . guaiFENesin  1,200 mg Oral BID  . insulin aspart  0-9 Units Subcutaneous TID WC  . losartan  50 mg Oral Daily  . magnesium gluconate  500 mg Oral Daily  . methylPREDNISolone (SOLU-MEDROL) injection  40 mg Intravenous Q6H  . mometasone-formoterol  2 puff Inhalation BID  . nebivolol  5 mg Oral Daily  . pantoprazole  40 mg Oral Daily  . polyethylene glycol  17 g Oral BID  . senna-docusate  1 tablet Oral BID  . tiotropium  18 mcg Inhalation Daily   Continuous Infusions:   LOS: 5 days   Merlene Laughter, DO [facility] Pager [phone]  If 7PM-7AM, please contact night-coverage [email or web address] Password [email or web address] 03/12/2017, 8:20 AM

## 2017-03-12 NOTE — Progress Notes (Addendum)
Daily Progress Note   Patient Name: [name]       Date: 03/12/2017 DOB: [birth year]  Age: 82 y.o. MRN#: [id number] Attending Physician: Merlene Laughter, DO Primary Care Physician: [name], MD Admit Date: 03/07/2017  Reason for Consultation/Follow-up: Establishing goals of care  Subjective: I feel much better.  I had an episode this morning, but the morphine really helped.  Questions about the long term negative effects of morphine were answered.  We then discussed SNF rehab.   Patient and her husband would like Palliative to follow at rehab.    She reports being mildly constipated.   Assessment: Advanced COPD.  Improving with hospital treatment.  Low dose liquid morphine helps with acute episodes of dyspnea.  Patient sitting up on the side of the bed looking chipper.  Has used the liquid morphine quite a bit over the last two days.   Patient Profile/HPI:  82 y.o. female  with past medical history of GOLD stage 4 COPD with emphysema on 3L chronic O2, chronic respiratory failure with hypoxia and hypercapnia, dCHF, HTN, iron deficiency anemia, and CAD. She now presents from home with worsening shortness of breath. She was admitted on 03/07/2017 for acute on chronic respiratory failure secondary to COPD exacerbation. She required BiPAP initially, and then was able to be weaned off. Palliative consulted to assist in clarifying goals of care.     Length of Stay: 5  Current Medications: Scheduled Meds:  . aspirin  81 mg Oral Daily  . atorvastatin  20 mg Oral Daily  . calcium-vitamin D  1 tablet Oral Q breakfast  . doxycycline  100 mg Oral Q12H  . enoxaparin (LOVENOX) injection  40 mg Subcutaneous Q24H  . furosemide  40 mg Oral Daily  . guaiFENesin  1,200 mg Oral BID  .  insulin aspart  0-9 Units Subcutaneous TID WC  . losartan  50 mg Oral Daily  . magnesium gluconate  500 mg Oral Daily  . methylPREDNISolone (SOLU-MEDROL) injection  40 mg Intravenous Q8H  . mometasone-formoterol  2 puff Inhalation BID  . nebivolol  5 mg Oral Daily  . pantoprazole  40 mg Oral Daily  . polyethylene glycol  17 g Oral BID  . senna-docusate  1 tablet Oral BID  . tiotropium  18 mcg Inhalation Daily  Continuous Infusions:   PRN Meds: acetaminophen **OR** acetaminophen, albuterol, aspirin-acetaminophen-caffeine, clonazePAM, HYDROcodone-acetaminophen, levalbuterol, morphine CONCENTRATE, ondansetron **OR** ondansetron (ZOFRAN) IV, senna-docusate, traZODone  Physical Exam        Well developed female, A&O with clear speech, sitting on the side of the bed, conversational Resp on 1L nasal cannula, no distress  Vital Signs: BP 124/81 (BP Location: Left Arm)   Pulse (!) 56   Temp 97.9 F (36.6 C) (Oral)   Resp 18   Ht 5\' 1"  (1.549 m)   Wt 57 kg (125 lb 11.2 oz)   SpO2 100%   BMI 23.75 kg/m  SpO2: SpO2: 100 % O2 Device: O2 Device: Nasal Cannula O2 Flow Rate: O2 Flow Rate (L/min): 1 L/min  Intake/output summary:  Intake/Output Summary (Last 24 hours) at 03/12/17 1205 Last data filed at 03/12/17 0859  Gross per 24 hour  Intake                0 ml  Output              900 ml  Net             -900 ml   LBM: Last BM Date: 03/07/17 Baseline Weight: Weight: 56.2 kg (124 lb) Most recent weight: Weight: 57 kg (125 lb 11.2 oz)       Palliative Assessment/Data:      Patient Active Problem List   Diagnosis Date Noted  . Palliative care encounter   . Goals of care, counseling/discussion   . Palliative care by specialist   . COPD with acute exacerbation (HCC) 03/07/2017  . Acute on chronic respiratory failure (HCC) 03/07/2017  . Hyperglycemia 03/07/2017  . Shortness of breath 07/29/2016  . Acute diastolic heart failure (HCC) 01/27/2016  . Acute on chronic  diastolic CHF (congestive heart failure), NYHA class 3 (HCC) 01/27/2016  . DOE (dyspnea on exertion) 01/27/2016  . Hypertensive heart disease with CHF (congestive heart failure) (HCC) 01/27/2016  . Diastolic dysfunction 11/02/2015  . CAD in native artery   . HLD (hyperlipidemia)   . Anxiety state   . Hypertrophic cardiomyopathy (HCC)   . Malnutrition of moderate degree 09/14/2015  . COPD exacerbation (HCC)   . Elevated troponin   . Respiratory distress 09/09/2015  . Acute on chronic respiratory failure with hypoxia and hypercapnia (HCC) 09/09/2015  . Sepsis (HCC) 09/09/2015  . Leukocytosis   . Elevated troponin I measurement   . CAD (coronary artery disease), native coronary artery 01/17/2013  . Carotid stenosis 05/09/2012  . Dynamic left ventricular outflow obstruction 04/19/2012  . Multiple lung nodules 04/19/2012  . Hyperlipidemia 04/16/2012  . Hypoxemia 04/16/2012  . Respiratory distress syndrome of newborn 04/16/2012  . Acute respiratory failure (HCC) 07/09/2011  . HTN (hypertension) 07/08/2011  . Carotid artery disease (HCC) 03/03/2011  . Occlusion and stenosis of carotid artery without mention of cerebral infarction 02/14/2011  . COPD (chronic obstructive pulmonary disease) (HCC) 06/15/2010  . DYSPNEA 06/15/2010  . Osteoporosis 01/29/2010  . PVD 09/29/2008  . Aortic aneurysm (HCC) 03/10/2008  . Phlebitis and thrombophlebitis of superficial veins of upper extremities 10/17/2007  . 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 Iron deficiency anemia 09/08/2007  . Tachycardia 09/08/2007  . Embolism and thrombosis of other specified artery(444.89) 09/08/2007  . Anxiety 01/09/2007  . CANDIDIASIS, VAGINAL 12/12/2006  . ACTINOMYCOSIS, CERVICOFACIAL 12/01/2006  . EMPHYSEMA NEC 12/01/2006    Palliative Care Plan    Recommendations/Plan:  Out of facility DNR is on the  chart  Please d/c with orders for morphine concentrate 2.5-5.0 mg q 3 hours PRN dyspnea  Will  increase senna to two tabs BID (particularly while on morphine)  Please write "Palliative to follow at SNF" in discharge summary.  PMT will follow peripherally - please call the office if we are needed [phone]  Goals of Care and Additional Recommendations:  Limitations on Scope of Treatment: Full Scope Treatment  Code Status:  Limited code  Prognosis:   Unable to determine   Discharge Planning:  Skilled Nursing Facility for rehab with Palliative care service follow-up  Care plan was discussed with patient and husband  Thank you for allowing the Palliative Medicine Team to assist in the care of this patient.  Total time spent:  25 min.     Greater than 50%  of this time was spent counseling and coordinating care related to the above assessment and plan.  Norvel RichardsMarianne Brandy Zuba, PA-C Palliative Medicine  Please contact Palliative MedicineTeam phone at [phone] for questions and concerns between 7 am - 7 pm.   Please see AMION for individual provider pager numbers.

## 2017-03-13 ENCOUNTER — Inpatient Hospital Stay (HOSPITAL_COMMUNITY): Payer: PPO

## 2017-03-13 DIAGNOSIS — T148XXA Other injury of unspecified body region, initial encounter: Secondary | ICD-10-CM

## 2017-03-13 LAB — COMPREHENSIVE METABOLIC PANEL
ALK PHOS: 33 U/L — AB (ref 38–126)
ALT: 24 U/L (ref 14–54)
ANION GAP: 8 (ref 5–15)
AST: 20 U/L (ref 15–41)
Albumin: 3.4 g/dL — ABNORMAL LOW (ref 3.5–5.0)
BILIRUBIN TOTAL: 0.6 mg/dL (ref 0.3–1.2)
BUN: 46 mg/dL — AB (ref 6–20)
CALCIUM: 9 mg/dL (ref 8.9–10.3)
CO2: 36 mmol/L — ABNORMAL HIGH (ref 22–32)
Chloride: 96 mmol/L — ABNORMAL LOW (ref 101–111)
Creatinine, Ser: 1.14 mg/dL — ABNORMAL HIGH (ref 0.44–1.00)
GFR calc Af Amer: 52 mL/min — ABNORMAL LOW (ref >60)
GFR, EST NON AFRICAN AMERICAN: 45 mL/min — AB (ref >60)
Glucose, Bld: 147 mg/dL — ABNORMAL HIGH (ref 65–99)
POTASSIUM: 4.2 mmol/L (ref 3.5–5.1)
Sodium: 140 mmol/L (ref 135–145)
TOTAL PROTEIN: 5.8 g/dL — AB (ref 6.5–8.1)

## 2017-03-13 LAB — CBC WITH DIFFERENTIAL/PLATELET
Basophils Absolute: 0 10*3/uL (ref 0.0–0.1)
Basophils Relative: 0 %
Eosinophils Absolute: 0 10*3/uL (ref 0.0–0.7)
Eosinophils Relative: 0 %
HEMATOCRIT: 34.8 % — AB (ref 36.0–46.0)
HEMOGLOBIN: 11.2 g/dL — AB (ref 12.0–15.0)
LYMPHS ABS: 0.8 10*3/uL (ref 0.7–4.0)
LYMPHS PCT: 7 %
MCH: 30.4 pg (ref 26.0–34.0)
MCHC: 32.2 g/dL (ref 30.0–36.0)
MCV: 94.6 fL (ref 78.0–100.0)
MONO ABS: 0.8 10*3/uL (ref 0.1–1.0)
MONOS PCT: 7 %
NEUTROS ABS: 10.4 10*3/uL — AB (ref 1.7–7.7)
NEUTROS PCT: 86 %
Platelets: 368 10*3/uL (ref 150–400)
RBC: 3.68 MIL/uL — ABNORMAL LOW (ref 3.87–5.11)
RDW: 12.8 % (ref 11.5–15.5)
WBC: 12 10*3/uL — ABNORMAL HIGH (ref 4.0–10.5)

## 2017-03-13 LAB — PHOSPHORUS: Phosphorus: 4.2 mg/dL (ref 2.5–4.6)

## 2017-03-13 LAB — GLUCOSE, CAPILLARY
GLUCOSE-CAPILLARY: 115 mg/dL — AB (ref 65–99)
GLUCOSE-CAPILLARY: 163 mg/dL — AB (ref 65–99)
GLUCOSE-CAPILLARY: 107 mg/dL — AB (ref 65–99)
Glucose-Capillary: 105 mg/dL — ABNORMAL HIGH (ref 65–99)
Glucose-Capillary: 126 mg/dL — ABNORMAL HIGH (ref 65–99)

## 2017-03-13 LAB — MAGNESIUM: MAGNESIUM: 2.7 mg/dL — AB (ref 1.7–2.4)

## 2017-03-13 MED ORDER — METHYLPREDNISOLONE SODIUM SUCC 40 MG IJ SOLR
40.0000 mg | Freq: Two times a day (BID) | INTRAMUSCULAR | Status: DC
  Administered 2017-03-14: 40 mg via INTRAVENOUS
  Filled 2017-03-13: qty 1

## 2017-03-13 NOTE — Progress Notes (Signed)
PROGRESS NOTE    Sheri SleeperHazel H Mcdonald  OZD:[id number]:[id number] DOB: [birth year] DOA: 03/07/2017 PCP: Alysia PennaHolwerda, Scott, MD  Brief Narrative:  [name] is a pleasant 82 y.o. female with medical history significant for hypoxic respiratory failure on home oxygen related to COPD, diastolic dysfunction, hypertension, CAD, anemia, anxiety presents to the emergency department with the chief complaint of worsening shortness of breath. Initial evaluation reveals acute on chronic respiratory failure likely related to COPD exacerbation. She stated that over the last week she's experience worsening shortness of breath. She is also had intermittent productive cough and mild increase in lower extremity edema. She denies chest pain palpitations headache dizziness syncope or near-syncope. She denies any abdominal pain nausea vomiting diarrhea constipation melena or bright red blood per rectum. She saw her neurologist 3 weeks ago she was placed on prednisone which she's completed. She also reports she saw a cardiologist around the same time increase her Lasix from 20-40 mg. She was admitted for Acute on Chronic Hypoxic Respiratory Failure from COPD Exacerbation. Palliative Care Medicine and Pulmonary have been consulted for further evaluation and management. Patient slowly improving. PT re-evaluated and recommending SNF.  Assessment & Plan:   Principal Problem:   Acute on chronic respiratory failure (HCC) Active Problems:   HTN (hypertension)   CAD (coronary artery disease), native coronary artery   HLD (hyperlipidemia)   Hypertrophic cardiomyopathy (HCC)   Anxiety   Iron deficiency anemia   Tachycardia   Diastolic dysfunction   COPD with acute exacerbation (HCC)   Hyperglycemia   Goals of care, counseling/discussion   Palliative care by specialist   Palliative care encounter   SOB (shortness of breath)  1. Acute on Chronic respiratory failure requiring NIPPV likely related to COPD with acute exacerbation. [facility]  [facility]  -Patient is on home oxygen at 2-3 L 24/ 7.  -Initial Chest x-ray reveals hyperinflation with emphysematous changes and scarring in the right lung. No infiltrate.  -EKG was tachycardic at 101 with atrial premature complex right atrial enlargement.  -Time of admission she was on BiPAP with moderate increased work of breathing and oxygen saturation level 88% on 3 L nasal cannula; C/w BiPAP qHS and Prn -Goal is to Maintain O2 Saturations 90-95% -Admitted to Step Down -Scheduled Ipratropium 0.5 mg q6h with Levalbuterol 0.63 q6h now D/C'd by Pulmonary. C/w Albuterol 2.5 mg Neb q2hprn wheezing -C/w Dulera and Spiriva -IV Methylprednisolone 40 mg IV q6h by Pulmonary and changed to 40 mg q8h yesterday AM; Will change to 40 mg IV q12h Started PPI given Long term Steroid use -Levaquin 500 mg po changed to IV Doxycyxline; Patient is Day 5 on Doxycyline and will D/C in AM -Monitor respiratory effort and oxygen saturation level and put back on BiPAP as indicated -Added Flutter Valve and increased Guaifenesin.  -Checked ECHOcardiogram and showed Systolic function was normal. The estimated ejection fraction was in the range of 55% to 60%. Wall motion was normal; there were no regional wall motion abnormalities. Doppler parameters are consistent with abnormal left ventricular relaxation (grade 1 diastolic dysfunction). -Pulmonary followed and appreciate evaluation and recommendations  -PT/OT Evaluated and Recommend SNF  2. COPD with Acute Exacerbation.  -Chart review indicates patient has had worsening shortness of breath over the last couple of weeks.  -Saw her pulmonology M.D. 3 weeks ago. Office note indicates she was given a course of prednisone, antibiotics were deferred at that time.  -Office note also indicates considering changing to nebulized LAMA, LABA and  ICS. In addition office note indicates patient may benefit from palliative care assessment -Nebulizers as noted  above -Continue Dulera substitution; Spiriva restarted by Pulmonary' -C/w Morphine Concentrate 2.6-5 mg SL q2hprn for SOB -C/w Magnesium Gluconate 500 mg po Daily -Solu-Medrol as above -Checked Respiratory Virus Panel and was Negative and Checked ECHO which showed EF of 55-60% with G1DD -Pulmonary consulted for further evaluation and management -Repeat CXR 8/30 showed COPD  -Repeat CXR on 9/1 showed slight hyperinflation, no acute cardiopulmonary disease and aortic atherosclerosis  -Palliative care consulted for Goals of Care and Psychospiritual Support; C/w Current care but no escalation in Care -PT/OT to Evaluate and Treat; Initially recommending Home Health PT but patient feels weaker and will get Re-evaluation   3. Tachycardia/CAD.  -Likely related to nebs. Mild.; Nebs now D/C'd -Initial troponin negative.  -Patient denies chest pain.  -Review indicates recent Myoview low risk -Monitor on telemetry -Continue home meds and Home Nebivolol   4. Hyperglycemia.  -Likely steroid-induced. CBG's ranging from 115-163 -Hemoglobin A1c was 5.6 -C/w Sensitive Novolog Sliding scale insulin AC  5. Hypertension.  -Home medications include Bystolic, Lasix, Cozaar -Continue Home Meds; Lasix Dose 40 mg po Daily instead of BID -Continue to Monitor  6. Diastolic dysfunction/Hypertrophic Cardiomyopathy.  -Appears stable at baseline  -Chart review indicates office visit with cardiology 2 weeks ago. Echo in March 2017 showed vigorous LVEF with an EF of 65% mild grade 1 diastolic dysfunction. -Repeat ECHOCARDIOGRAM as below -Daily weights -Monitor intake and output; Patient is -2.830 Liters -Held Lasix initially but now restarted  -Monitor  7. Acute Kidney Injury.  -Creatinine 1.26 on Admission. Likely related decreased oral intake in the setting of Lasix and Cozaar. -IVF D/C'd and Lasix restarted; Changed po Lasix 40 mg BID to 40 mg Daily because of worsening Cr yesterday and Cr improved   -BUN/Cr went from 31/0.94 -> 34/1.03 -> 40/1.12 -> 41/0.97 -> 43/0.94 -> 46/1.14 -Repeat CMP in AM  8. Iron Deficiency Anemia.  -Hemoglobin 11.4 on admission. Hb/Hct Dropped to 9.7/30.4. ?Dilutional Drop but now has Improved back to 11.2/34.8 -Continue home meds  9. Anxiety. -Patient appears slightly anxious. -Continue home meds of Clonazepam 0.25 mg po BID  10. Leukocytosis -Mild as WBC went from 9.6 -> 12.0 -Likely from IV Steroid Demargination; Steroids being weaned -Continue to Monitor for S/Sx of Infection  11. Constipation, improved -Had not had a BM since Tuesday -C/w Miralax 17 grams po BID and Senna-Docusate 2 Tab po BID  12. BUN/Cr Dissociation -Steroid mediated as BUN 43 and Cr 0.95 -Continue to Monitor   13. Bilateral Bruising in AC's  -Vascular U/S Bilaterally showed No sonographic findings of mass lesion or cystic lesion in the left or right antecubital region -Continue Warm Compresses   DVT prophylaxis: Enoxaparin 30 mg sq q24 Code Status: FULL CODE Family Communication: Discussed with Husband at bedside Disposition Plan: SNF in AM if Has bed Placement   Consultants:   Palliative Care Medicine  Pulmonary    Procedures:  ECHOCARDIOGRAM  - Left ventricle: The cavity size was normal. There was mild focal   basal hypertrophy of the septum. Systolic function was normal.   The estimated ejection fraction was in the range of 55% to 60%.   Wall motion was normal; there were no regional wall motion   abnormalities. Doppler parameters are consistent with abnormal   left ventricular relaxation (grade 1 diastolic dysfunction).   Doppler parameters are consistent with indeterminate ventricular   filling pressure. - Aortic valve:  Transvalvular velocity was within the normal range.   There was no stenosis. There was no regurgitation. - Mitral valve: Moderately calcified annulus. Transvalvular   velocity was within the normal range. There was no evidence  for   stenosis. There was mild regurgitation. - Right ventricle: The cavity size was normal. Wall thickness was   normal. Systolic function was normal. - Atrial septum: No defect or patent foramen ovale was identified. - Tricuspid valve: There was no regurgitation.   Antimicrobials: Anti-infectives    Start     Dose/Rate Route Frequency Ordered Stop   03/10/17 2200  doxycycline (VIBRA-TABS) tablet 100 mg     100 mg Oral Every 12 hours 03/10/17 0940     03/10/17 1000  doxycycline (VIBRAMYCIN) 100 mg in dextrose 5 % 250 mL IVPB     100 mg 125 mL/hr over 120 Minutes Intravenous Every 12 hours 03/10/17 0940 03/10/17 1156   03/09/17 1000  doxycycline (VIBRAMYCIN) 100 mg in dextrose 5 % 250 mL IVPB  Status:  Discontinued     100 mg 125 mL/hr over 120 Minutes Intravenous Every 12 hours 03/09/17 0922 03/10/17 0940   03/07/17 0800  levofloxacin (LEVAQUIN) tablet 500 mg  Status:  Discontinued     500 mg Oral Every 48 hours 03/07/17 0707 03/09/17 4098     Subjective: Seen and examined and stated she was feeling good this AM. States po morphine really helps. No CP. Denied any other complaints or concerns.   Objective: Vitals:   03/13/17 0500 03/13/17 0719 03/13/17 1429 03/13/17 1630  BP:   (!) 104/56 114/60  Pulse:  78  63  Resp:  20    Temp:      TempSrc:      SpO2:  99%  97%  Weight: 57.5 kg (126 lb 12.2 oz)     Height:        Intake/Output Summary (Last 24 hours) at 03/13/17 1929 Last data filed at 03/13/17 0007  Gross per 24 hour  Intake              240 ml  Output                0 ml  Net              240 ml   Filed Weights   03/11/17 0430 03/12/17 0421 03/13/17 0500  Weight: 56.9 kg (125 lb 7.1 oz) 57 kg (125 lb 11.2 oz) 57.5 kg (126 lb 12.2 oz)   Examination: Physical Exam:  Constitutional: Pleasant thin Caucasian female who appears to be feeling better Eyes: Sclerae anicteric; Conjunctivae non-injected ENMT: Grossly normal hearing. External ears and nose appear  normal Neck: Supple with no JVD Respiratory: Diminished breath sounds with no wheezing; Patient had slightly labored breathing. Wearing Supplemental O2 via [place] Cardiovascular: RRR; S1, S2; Trace LE edema Abdomen: Soft, NT, ND. Bowel sounds present GU: Deferred Musculoskeletal: No contractures; No cyanosis Skin: Had bilateral bruising in Upper Extremities. Both Antecubital [place] had brusising and Left worse than right Neurologic: CN 2-12 grossly intact. No appreciable focal deficits Psychiatric: Normal mood and affect. Intact judgement and insight  Data Reviewed: I have personally reviewed following labs and imaging studies  CBC:  Recent Labs Lab 03/09/17 0315 03/10/17 0247 03/11/17 0533 03/12/17 0254 03/13/17 0233  WBC 14.8* 12.9* 9.8 9.6 12.0*  NEUTROABS 13.4* 11.6* 8.4* 8.0* 10.4*  HGB 9.7* 10.2* 10.6* 10.5* 11.2*  HCT 30.4* 32.0* 33.0* 32.3* 34.8*  MCV 94.1 94.7 93.0  93.4 94.6  PLT 346 340 341 344 368   Basic Metabolic Panel:  Recent Labs Lab 03/09/17 0315 03/10/17 0247 03/11/17 0533 03/12/17 0254 03/13/17 0233  NA 140 140 140 140 140  K 4.2 3.9 4.1 4.2 4.2  CL 100* 100* 100* 99* 96*  CO2 32 32 30 33* 36*  GLUCOSE 140* 133* 136* 122* 147*  BUN 34* 40* 41* 43* 46*  CREATININE 1.03* 1.12* 0.97 0.94 1.14*  CALCIUM 8.3* 8.5* 8.7* 8.8* 9.0  MG 2.4 2.3 2.5* 2.4 2.7*  PHOS 4.1 3.9 3.7 3.4 4.2   GFR: Estimated Creatinine Clearance: 32.7 mL/min (A) (by C-G formula based on SCr of 1.14 mg/dL (H)). Liver Function Tests:  Recent Labs Lab 03/09/17 0315 03/10/17 0247 03/11/17 0533 03/12/17 0254 03/13/17 0233  AST 25 23 17 20 20   ALT 21 22 21 22 24   ALKPHOS 35* 34* 31* 30* 33*  BILITOT 0.5 0.5 0.3 0.4 0.6  PROT 5.9* 6.0* 5.7* 5.5* 5.8*  ALBUMIN 3.4* 3.3* 3.3* 3.3* 3.4*   No results for input(s): LIPASE, AMYLASE in the last 168 hours. No results for input(s): AMMONIA in the last 168 hours. Coagulation Profile: No results for input(s): INR, PROTIME in the last  168 hours. Cardiac Enzymes:  Recent Labs Lab 03/07/17 0335  TROPONINI <0.03   BNP (last 3 results)  Recent Labs  07/15/16 1542 01/26/17 1137  PROBNP 90 91   HbA1C: No results for input(s): HGBA1C in the last 72 hours. CBG:  Recent Labs Lab 03/12/17 1353 03/12/17 1735 03/12/17 2105 03/13/17 0740 03/13/17 1226  GLUCAP 137* 88 121* 115* 163*   Lipid Profile: No results for input(s): CHOL, HDL, LDLCALC, TRIG, CHOLHDL, LDLDIRECT in the last 72 hours. Thyroid Function Tests: No results for input(s): TSH, T4TOTAL, FREET4, T3FREE, THYROIDAB in the last 72 hours. Anemia Panel: No results for input(s): VITAMINB12, FOLATE, FERRITIN, TIBC, IRON, RETICCTPCT in the last 72 hours. Sepsis Labs: No results for input(s): PROCALCITON, LATICACIDVEN in the last 168 hours.  Recent Results (from the past 240 hour(s))  Respiratory Panel by PCR     Status: None   Collection Time: 03/10/17  3:48 PM  Result Value Ref Range Status   Adenovirus NOT DETECTED NOT DETECTED Final   Coronavirus 229E NOT DETECTED NOT DETECTED Final   Coronavirus HKU1 NOT DETECTED NOT DETECTED Final   Coronavirus NL63 NOT DETECTED NOT DETECTED Final   Coronavirus OC43 NOT DETECTED NOT DETECTED Final   Metapneumovirus NOT DETECTED NOT DETECTED Final   Rhinovirus / Enterovirus NOT DETECTED NOT DETECTED Final   Influenza A NOT DETECTED NOT DETECTED Final   Influenza B NOT DETECTED NOT DETECTED Final   Parainfluenza Virus 1 NOT DETECTED NOT DETECTED Final   Parainfluenza Virus 2 NOT DETECTED NOT DETECTED Final   Parainfluenza Virus 3 NOT DETECTED NOT DETECTED Final   Parainfluenza Virus 4 NOT DETECTED NOT DETECTED Final   Respiratory Syncytial Virus NOT DETECTED NOT DETECTED Final   Bordetella pertussis NOT DETECTED NOT DETECTED Final   Chlamydophila pneumoniae NOT DETECTED NOT DETECTED Final   Mycoplasma pneumoniae NOT DETECTED NOT DETECTED Final    Radiology Studies: [place] Rt Upper Extrem Ltd Soft Tissue Non  Vascular  Result Date: 03/13/2017 CLINICAL DATA:  Antecubital hematoma EXAM: ULTRASOUND right UPPER EXTREMITY LIMITED TECHNIQUE: Ultrasound examination of the upper extremity soft tissues was performed in the area of clinical concern. COMPARISON:  None FINDINGS: In the region of erythema and swelling we demonstrate no mass lesion or cystic lesion. IMPRESSION:  1. No specific antecubital lesion is observed sonographically on the right. Electronically Signed   By: Gaylyn Rong M.D.   On: 03/13/2017 14:15   [place] Lt Upper Extrem Ltd Soft Tissue Non Vascular  Result Date: 03/13/2017 CLINICAL DATA:  Left antecubital soft tissue swelling EXAM: ULTRASOUND Left UPPER EXTREMITY LIMITED TECHNIQUE: Ultrasound examination of the upper extremity soft tissues was performed in the area of clinical concern. COMPARISON:  None FINDINGS: In the left antecubital region we demonstrate no abnormal masslike appearance or cystic lesion. IMPRESSION: 1. No sonographic findings of mass lesion or cystic lesion in the left antecubital region. Electronically Signed   By: Gaylyn Rong M.D.   On: 03/13/2017 14:16   Scheduled Meds: . aspirin  81 mg Oral Daily  . atorvastatin  20 mg Oral Daily  . calcium-vitamin D  1 tablet Oral Q breakfast  . doxycycline  100 mg Oral Q12H  . enoxaparin (LOVENOX) injection  40 mg Subcutaneous Q24H  . furosemide  40 mg Oral Daily  . guaiFENesin  1,200 mg Oral BID  . insulin aspart  0-9 Units Subcutaneous TID WC  . losartan  50 mg Oral Daily  . magnesium gluconate  500 mg Oral Daily  . methylPREDNISolone (SOLU-MEDROL) injection  40 mg Intravenous Q8H  . mometasone-formoterol  2 puff Inhalation BID  . nebivolol  5 mg Oral Daily  . pantoprazole  40 mg Oral Daily  . polyethylene glycol  17 g Oral BID  . senna-docusate  2 tablet Oral BID  . tiotropium  18 mcg Inhalation Daily   Continuous Infusions:   LOS: 6 days   Merlene Laughter, DO [facility] Pager [phone]  If  7PM-7AM, please contact night-coverage [email or web address] Password [email or web address] 03/13/2017, 7:29 PM

## 2017-03-13 NOTE — Progress Notes (Signed)
Occupational Therapy Treatment Patient Details Name: [name] MRN: [id number] DOB: June 04, 1935 Today's Date: 03/13/2017    History of present illness 82 y.o. female admitted with complaints of increasing shortness of breath Initial evaluation reveals acute on chronic respiratory failure likely related to COPD exacerbation. Prior medical history significant for hypoxic respiratory failure on home oxygen related to COPD, diastolic dysfunction, hypertension, CAD, anemia, anxiety    OT comments  Pt continues to demonstrate decreased activity tolerance and anxiety, but was able to complete 2 grooming activities at sink, toileting and LB dressing with min guard assist. Updated d/c plan to SNF as pt requires morphine to tolerate activity. Will continue to follow.  Follow Up Recommendations  SNF    Equipment Recommendations  None recommended by OT    Recommendations for Other Services      Precautions / Restrictions Precautions Precautions: Fall Restrictions Weight Bearing Restrictions: No       Mobility Bed Mobility               General bed mobility comments: in chair  Transfers Overall transfer level: Needs assistance Equipment used: Rolling walker (2 wheeled) Transfers: Sit to/from Stand Sit to Stand: Min guard Stand pivot transfers: Min guard       General transfer comment: min guard for safety    Balance Overall balance assessment: Needs assistance Sitting-balance support: Feet unsupported;No upper extremity supported Sitting balance-Leahy Scale: Good     Standing balance support: No upper extremity supported Standing balance-Leahy Scale: Fair Standing balance comment: can stand at sink without UE support                           ADL either performed or assessed with clinical judgement   ADL Overall ADL's : Needs assistance/impaired     Grooming: Wash/dry hands;Brushing hair;Standing;Min guard               Lower Body Dressing:  Sit to/from stand;Min guard Lower Body Dressing Details (indicate cue type and reason): able to cross foot over opposite knee Toilet Transfer: Ambulation;BSC;RW;Min guard   Toileting- Clothing Manipulation and Hygiene: Sit to/from stand;Min guard       Functional mobility during ADLs: Min guard;Rolling walker General ADL Comments: Pt is faithful to use her flutter valve and incentive spirometer. She is aware of energy conservation strategies during ADL and used pursed lip breathing techniques consistently.     Vision       Perception     Praxis      Cognition Arousal/Alertness: Awake/alert Behavior During Therapy: WFL for tasks assessed/performed Overall Cognitive Status: Within Functional Limits for tasks assessed                                          Exercises     Shoulder Instructions       General Comments      Pertinent Vitals/ Pain       Pain Assessment: No/denies pain  Home Living                                          Prior Functioning/Environment              Frequency  Min 2X/week  Progress Toward Goals  OT Goals(current goals can now be found in the care plan section)  Progress towards OT goals: Progressing toward goals  Acute Rehab OT Goals Patient Stated Goal: to get back to her normal  OT Goal Formulation: With patient Time For Goal Achievement: 03/22/17 Potential to Achieve Goals: Good  Plan Discharge plan needs to be updated    Co-evaluation                 AM-PAC PT "6 Clicks" Daily Activity     Outcome Measure   Help from another person eating meals?: None Help from another person taking care of personal grooming?: A Little Help from another person toileting, which includes using toliet, bedpan, or urinal?: A Little Help from another person bathing (including washing, rinsing, drying)?: A Lot Help from another person to put on and taking off regular upper body clothing?:  None Help from another person to put on and taking off regular lower body clothing?: A Little 6 Click Score: 19    End of Session Equipment Utilized During Treatment: Rolling walker;Oxygen  OT Visit Diagnosis: Unsteadiness on feet (R26.81)   Activity Tolerance Patient limited by fatigue   Patient Left in chair;with call bell/phone within reach;with family/visitor present   Nurse Communication          Time: [id number] OT Time Calculation (min): 21 min  Charges: OT General Charges $OT Visit: 1 Visit OT Treatments $Self Care/Home Management : 8-22 mins  03/13/2017 [name], OTR/L Pager: [phone]   [name], [name] 03/13/2017, 1:33 PM

## 2017-03-13 NOTE — Progress Notes (Addendum)
Physical Therapy Treatment Patient Details Name: Sheri SleeperHazel H Mcdonald MRN: [id number] DOB: January 22, 1935 Today's Date: 03/13/2017    History of Present Illness 82 y.o. female admitted with complaints of increasing shortness of breath Initial evaluation reveals acute on chronic respiratory failure likely related to COPD exacerbation. Prior medical history significant for hypoxic respiratory failure on home oxygen related to COPD, diastolic dysfunction, hypertension, CAD, anemia, anxiety     PT Comments    Patient is making progress toward mobility goals. Pt continues to demonstrate decreased activity tolerance and anxiety with mobility. Pt currently receiving liquid morphine to decrease dyspnea. Pt will benefit from further skille PT services in post acute setting to maximize independence and safety with mobility.   Follow Up Recommendations  Supervision for mobility/OOB;SNF     Equipment Recommendations  None recommended by PT    Recommendations for Other Services       Precautions / Restrictions Precautions Precautions: Fall Restrictions Weight Bearing Restrictions: No    Mobility  Bed Mobility               General bed mobility comments: sitting EOB upon arrival  Transfers Overall transfer level: Needs assistance Equipment used: Rolling walker (2 wheeled) Transfers: Sit to/from [employer] CorporationStand;Stand Pivot Transfers Sit to Stand: Min guard Stand pivot transfers: Min guard       General transfer comment: min guard for safety  Ambulation/Gait Ambulation/Gait assistance: Min assist Ambulation Distance (Feet): 26 Feet Assistive device: Rolling walker (2 wheeled) Gait Pattern/deviations: Step-through pattern;Decreased step length - right;Decreased step length - left;Trunk flexed;Decreased stride length Gait velocity: slowed   General Gait Details: cues for posture and proximity of RW; 2 brief standing rest breaks due to DOE; VSS   Stairs            Wheelchair Mobility     Modified Rankin (Stroke Patients Only)       Balance Overall balance assessment: Needs assistance Sitting-balance support: Feet unsupported;No upper extremity supported Sitting balance-Leahy Scale: Good     Standing balance support: No upper extremity supported Standing balance-Leahy Scale: Fair                              Cognition Arousal/Alertness: Awake/alert Behavior During Therapy: WFL for tasks assessed/performed Overall Cognitive Status: Within Functional Limits for tasks assessed                                        Exercises      General Comments        Pertinent Vitals/Pain Pain Assessment: No/denies pain    Home Living                      Prior Function            PT Goals (current goals can now be found in the care plan section) Acute Rehab PT Goals PT Goal Formulation: With patient Time For Goal Achievement: 03/15/17 Potential to Achieve Goals: Good Progress towards PT goals: Progressing toward goals    Frequency    Min 3X/week      PT Plan Discharge plan needs to be updated    Co-evaluation              AM-PAC PT "6 Clicks" Daily Activity  Outcome Measure  Difficulty turning over in bed (including adjusting bedclothes, sheets and  blankets)?: A Lot Difficulty moving from lying on back to sitting on the side of the bed? : A Lot Difficulty sitting down on and standing up from a chair with arms (e.g., wheelchair, bedside commode, etc,.)?: A Little Help needed moving to and from a bed to chair (including a wheelchair)?: None Help needed walking in hospital room?: A Little Help needed climbing 3-5 steps with a railing? : A Lot 6 Click Score: 16    End of Session Equipment Utilized During Treatment: Gait belt;Oxygen Activity Tolerance: Patient tolerated treatment well Patient left: in chair;with call bell/phone within reach;with family/visitor present Nurse Communication: Mobility  status PT Visit Diagnosis: Other abnormalities of gait and mobility (R26.89);Muscle weakness (generalized) (M62.81);Difficulty in walking, not elsewhere classified (R26.2)     Time: 6213-0865 PT Time Calculation (min) (ACUTE ONLY): 34 min  Charges:  $Gait Training: 8-22 mins $Therapeutic Activity: 8-22 mins                    G Codes:       Erline Levine, PTA Pager: [phone]     Carolynne Edouard 03/13/2017, 1:18 PM

## 2017-03-14 DIAGNOSIS — R0902 Hypoxemia: Secondary | ICD-10-CM

## 2017-03-14 DIAGNOSIS — J9612 Chronic respiratory failure with hypercapnia: Secondary | ICD-10-CM

## 2017-03-14 LAB — CBC WITH DIFFERENTIAL/PLATELET
BASOS ABS: 0 10*3/uL (ref 0.0–0.1)
Basophils Relative: 0 %
Eosinophils Absolute: 0 10*3/uL (ref 0.0–0.7)
Eosinophils Relative: 0 %
HEMATOCRIT: 35.2 % — AB (ref 36.0–46.0)
HEMOGLOBIN: 11.3 g/dL — AB (ref 12.0–15.0)
LYMPHS PCT: 12 %
Lymphs Abs: 1.3 10*3/uL (ref 0.7–4.0)
MCH: 30 pg (ref 26.0–34.0)
MCHC: 32.1 g/dL (ref 30.0–36.0)
MCV: 93.4 fL (ref 78.0–100.0)
Monocytes Absolute: 1.1 10*3/uL — ABNORMAL HIGH (ref 0.1–1.0)
Monocytes Relative: 11 %
NEUTROS ABS: 7.8 10*3/uL — AB (ref 1.7–7.7)
Neutrophils Relative %: 77 %
Platelets: 319 10*3/uL (ref 150–400)
RBC: 3.77 MIL/uL — AB (ref 3.87–5.11)
RDW: 12.7 % (ref 11.5–15.5)
WBC: 10.2 10*3/uL (ref 4.0–10.5)

## 2017-03-14 LAB — COMPREHENSIVE METABOLIC PANEL
ALK PHOS: 30 U/L — AB (ref 38–126)
ALT: 20 U/L (ref 14–54)
AST: 20 U/L (ref 15–41)
Albumin: 3.2 g/dL — ABNORMAL LOW (ref 3.5–5.0)
Anion gap: 8 (ref 5–15)
BUN: 53 mg/dL — AB (ref 6–20)
CHLORIDE: 96 mmol/L — AB (ref 101–111)
CO2: 32 mmol/L (ref 22–32)
Calcium: 8.8 mg/dL — ABNORMAL LOW (ref 8.9–10.3)
Creatinine, Ser: 1.19 mg/dL — ABNORMAL HIGH (ref 0.44–1.00)
GFR calc Af Amer: 49 mL/min — ABNORMAL LOW (ref >60)
GFR, EST NON AFRICAN AMERICAN: 42 mL/min — AB (ref >60)
Glucose, Bld: 99 mg/dL (ref 65–99)
Potassium: 4.5 mmol/L (ref 3.5–5.1)
Sodium: 136 mmol/L (ref 135–145)
TOTAL PROTEIN: 5.6 g/dL — AB (ref 6.5–8.1)
Total Bilirubin: 0.6 mg/dL (ref 0.3–1.2)

## 2017-03-14 LAB — PHOSPHORUS: Phosphorus: 4.6 mg/dL (ref 2.5–4.6)

## 2017-03-14 LAB — GLUCOSE, CAPILLARY
Glucose-Capillary: 90 mg/dL (ref 65–99)
Glucose-Capillary: 139 mg/dL — ABNORMAL HIGH (ref 65–99)

## 2017-03-14 LAB — MAGNESIUM: MAGNESIUM: 2.6 mg/dL — AB (ref 1.7–2.4)

## 2017-03-14 MED ORDER — POLYETHYLENE GLYCOL 3350 17 G PO PACK: 17.0000 g | PACK | Freq: Two times a day (BID) | ORAL | 0 refills | Status: DC

## 2017-03-14 MED ORDER — TRAZODONE HCL 50 MG PO TABS: 25.0000 mg | ORAL_TABLET | Freq: Every evening | ORAL | 0 refills | Status: AC | PRN

## 2017-03-14 MED ORDER — GUAIFENESIN ER 600 MG PO TB12: 1200.0000 mg | ORAL_TABLET | Freq: Two times a day (BID) | ORAL | 0 refills | Status: AC

## 2017-03-14 MED ORDER — SENNOSIDES-DOCUSATE SODIUM 8.6-50 MG PO TABS: 2.0000 | ORAL_TABLET | Freq: Two times a day (BID) | ORAL | 0 refills | Status: DC

## 2017-03-14 MED ORDER — PREDNISONE 10 MG (21) PO TBPK: ORAL_TABLET | ORAL | 0 refills | Status: DC

## 2017-03-14 MED ORDER — MAGNESIUM GLUCONATE 500 MG PO TABS: 500.0000 mg | ORAL_TABLET | Freq: Every day | ORAL | 0 refills | Status: DC

## 2017-03-14 MED ORDER — ONDANSETRON HCL 4 MG PO TABS: 4.0000 mg | ORAL_TABLET | Freq: Four times a day (QID) | ORAL | 0 refills | Status: AC | PRN

## 2017-03-14 MED ORDER — FUROSEMIDE 40 MG PO TABS: 40.0000 mg | ORAL_TABLET | Freq: Every day | ORAL | 0 refills | Status: DC

## 2017-03-14 MED ORDER — MORPHINE SULFATE (CONCENTRATE) 10 MG /0.5 ML PO SOLN: 2.5000 mg | ORAL | 0 refills | Status: DC | PRN

## 2017-03-14 MED ORDER — PANTOPRAZOLE SODIUM 40 MG PO TBEC: 40.0000 mg | DELAYED_RELEASE_TABLET | Freq: Every day | ORAL | 0 refills | Status: AC

## 2017-03-14 NOTE — Progress Notes (Signed)
Assumed care from off going RN; patient alert & oriented; patient has some shortness of breath but no signs of acute distress; patient denies pain; patient given Morphine SL through out shift for shortness of breath; patient up in chair majority of shift; patient discharge home; refused home heath; IV taken out

## 2017-03-14 NOTE — Progress Notes (Signed)
Clinical Social Worker met patient at bedside and patient informed CSW that she had changed her mind and will go home with spouse with home health to follow. RNCM is following patient and has set up home health needs. CSW signing off as patient no longer has social work needs.  Rhea Pink, MSW,  [place]

## 2017-03-14 NOTE — Care Management Note (Addendum)
Case Management Note Donn PieriniKristi Tyray Proch RN, BSN Unit 4E-Case Manager [phone]  Patient Details  Name: [name] MRN: [id number] Date of Birth: 09/22/1934  Subjective/Objective:  Pt admitted with COPD                Action/Plan: PTA pt lived at home with spouse- has home 702- PCP-  [place] - CM to follow for d/c needs- referral for COPD GOLD pt does not have greater than 3 admissions in last 6 mo.   Expected Discharge Date:  03/14/17               Expected Discharge Plan:  Home w Home Health Services  In-House Referral:  Clinical Social Work, Hosp[facility] / Palliative Care, [facility]  Discharge planning Services  CM Consult  Post Acute Care Choice:  Durable Medical Equipment, Home Health Choice offered to:   patient  DME Arranged:   NA DME Agency:    NA  HH Arranged:   RN/PT/OT/aide- pt refused services HH Agency:   NA  Status of Service:  Completed, signed off  If discussed at Long Length of Stay Meetings, dates discussed:    Discharge Disposition: home/self care   Additional Comments:  03/14/17- 1600- Bailynn Dyk RN, CM- pt for d/c home today- has declined SNF and wants to return home- spoke with pt at bedside regarding HH services- pt politely declines any HH services at this time- states she just does not want to pay the copay cost. HH orders placed for HHRN/PT/OT/aide- pt states that she does not want any of these services- family at the beside and supports pt's decision. Pt has home 02 and portable concentrator in the car- no other DME needs - Palliative care referral made last week to [facility]- pt is active with [facility].  No further CM needs for discharge-   03/09/17- 1430- Donn PieriniKristi Coalton Arch RN, CM- [facility]- spoke with pt at bedside- pt [facility]- [facility]- pt reports that [facility]- but [facility] over the [facility] pt  is considering Home hospice- has decided on DNR for home- Gold DNR form on shadow chart for discharge. Pt has DME at home that includes- home 02 (baseline 3L), BSC, power w/c, 3 wheeled walker (tripod), scooter, ramps- she is thinking about hospital bed as she has to use step stool to get in her current bed at home. CM will follow for d/c needs- referral for PC- [facility]- msg left on referral line. Also [facility] [place] [facility] who [facility] also aware of [facility].   Darrold SpanWebster, Ziana Heyliger Hall, RN 03/14/2017, 4:03 PM

## 2017-03-14 NOTE — Progress Notes (Signed)
PULMONARY / CRITICAL CARE MEDICINE   Name: [name] MRN: [id number] DOB: 1934-08-05    ADMISSION DATE:  03/07/2017 CONSULTATION DATE:  03/10/2017  REFERRING MD:  Dr. Tiffany Kocher  CHIEF COMPLAINT:  Short or breath  HISTORY OF PRESENT ILLNESS:   82 yo female brought to ER with dyspnea.  This was associated with cough, and leg swelling.  She needed Bipap in ER.  She was felt to have COPD exacerbation.  She is normally followed in pulmonary office by Dr. Delton Coombes for GOLD 4D COPD with emphysema and chronic respiratory failure.  I saw her on 02/08/17 for COPD exacerbation.  She felt better when she was on prednisone.  She is having cough with yellow sputum.  She was having wheeze, but feels this is better.  Still tight in her chest.  Feels that Bipap helps.  Subjective:  Pt. States she feels better. States she feels she is almost back to baseline. Currently on 3L [place]. Feels the morphine is helping. Does not feel she is ready for Palliation or Hospice.Dyspnea with exertion this am when getting OOB to chair.     INTAKE / OUTPUT: I/O last 3 completed shifts: In: 240 [P.O.:240] Out: 200 [Urine:200]  PHYSICAL EXAMINATION: Gen:      OOB in chair , Mild dyspnea, on 3 L [place] HEENT:  Normocephalic, atraumatic Neck:     No masses; no thyromegaly, No LAD Lungs:    Mild exp wheeze; mild dyspnea CV:         S1, S2, No RMG, RRR Abd:      + bowel sounds; soft, non-tender; no palpable masses, no distension Ext:    No edema; brisk refill, warm to touch Skin:      Intact, warm and dry, no rash, lesions Neuro: alert and oriented x 3, MAE x 4 Psych: normal mood and affect, slightly anxious  LABS:  BMET  Recent Labs Lab 03/12/17 0254 03/13/17 0233 03/14/17 0453  NA 140 140 136  K 4.2 4.2 4.5  CL 99* 96* 96*  CO2 33* 36* 32  BUN 43* 46* 53*  CREATININE 0.94 1.14* 1.19*  GLUCOSE 122* 147* 99    Electrolytes  Recent Labs Lab 03/12/17 0254 03/13/17 0233 03/14/17 0453  CALCIUM 8.8* 9.0 8.8*   MG 2.4 2.7* 2.6*  PHOS 3.4 4.2 4.6    CBC  Recent Labs Lab 03/12/17 0254 03/13/17 0233 03/14/17 0453  WBC 9.6 12.0* 10.2  HGB 10.5* 11.2* 11.3*  HCT 32.3* 34.8* 35.2*  PLT 344 368 319    Coag's No results for input(s): APTT, INR in the last 168 hours.  Sepsis Markers No results for input(s): LATICACIDVEN, PROCALCITON, O2SATVEN in the last 168 hours.  ABG  Recent Labs Lab 03/07/17 1642  PHART 7.400  PCO2ART 47.6  PO2ART 112.0*    Liver Enzymes  Recent Labs Lab 03/12/17 0254 03/13/17 0233 03/14/17 0453  AST 20 20 20   ALT 22 24 20   ALKPHOS 30* 33* 30*  BILITOT 0.4 0.6 0.6  ALBUMIN 3.3* 3.4* 3.2*    Cardiac Enzymes No results for input(s): TROPONINI, PROBNP in the last 168 hours.  Glucose  Recent Labs Lab 03/13/17 0740 03/13/17 1226 03/13/17 1629 03/13/17 1716 03/13/17 2059 03/14/17 0610  GLUCAP 115* 163* 107* 105* 126* 90    Imaging [place] Rt Upper Extrem Ltd Soft Tissue Non Vascular  Result Date: 03/13/2017 CLINICAL DATA:  Antecubital hematoma EXAM: ULTRASOUND right UPPER EXTREMITY LIMITED TECHNIQUE: Ultrasound examination of the upper extremity soft tissues  was performed in the area of clinical concern. COMPARISON:  None FINDINGS: In the region of erythema and swelling we demonstrate no mass lesion or cystic lesion. IMPRESSION: 1. No specific antecubital lesion is observed sonographically on the right. Electronically Signed   By: Gaylyn RongWalter  Liebkemann M.D.   On: 03/13/2017 14:15   [place] Lt Upper Extrem Ltd Soft Tissue Non Vascular  Result Date: 03/13/2017 CLINICAL DATA:  Left antecubital soft tissue swelling EXAM: ULTRASOUND Left UPPER EXTREMITY LIMITED TECHNIQUE: Ultrasound examination of the upper extremity soft tissues was performed in the area of clinical concern. COMPARISON:  None FINDINGS: In the left antecubital region we demonstrate no abnormal masslike appearance or cystic lesion. IMPRESSION: 1. No sonographic findings of mass lesion or cystic  lesion in the left antecubital region. Electronically Signed   By: Gaylyn RongWalter  Liebkemann M.D.   On: 03/13/2017 14:16     STUDIES:  Echo 8/30 >> EF 55 to 60%, grade 1 DD, mild MR CXR 9/1 > Hyperinflation. No acute changes. I have reviewed the images personally  CULTURES: Respiratory viral panel >> Neg  ANTIBIOTICS: Levaquin 8/30 >> 8/30 Doxycycline 8/30 >>   SIGNIFICANT EVENTS: 8/28 Admit 8/29 Palliative care consult >> partial code  ASSESSMENT / PLAN: COPD exacerbation. Acute on chronic hypoxic, hypercapnic respiratory failure. Hx of GOLD D 4 COPD with emphysema.  Feels inhalers work better than nebulizer tx- Continue dulera and spiriva Continue solumedrol to 40 mg q6h. Can change to 40 mg q 8 tomorrow. She will need a slow taper to po prednisone upon discharge .  Consider low dose prednisone as maintenance per Dr. Delton CoombesByrum PRN albuterol Prn CXR Bipap as needed and during the night May need to consider nocturnal bipap as out patient. Started on PRN morphine per palliative care. She feels this is helping. Titrate Oxygen to keep SpO2 90 to 95% Continue doxy day 6  As patient is approaching her baseline, PCCM will sign off. Pt. Has an appointment with Dr. Delton CoombesByrum her primary pulmonologist scheduled for  9/11.   Please re-consult if we can be of any further assistance.   Bevelyn NgoSarah F. Groce, AGACNP-BC [facility] Pager [phone] 03/14/2017, 8:42 AM  Attending Note:  82 year old female with PMH of COPD presenting to the hospital with COPD exacerbation.  On exam, improving end exp wheezing noted.  I reviewed CXR myself, hyperinflation noted.  Discussed with PCCM-NP.  COPD exacerbation:  - Discharge on steroids  - F/U as outpatient, will likely need chronic steroids at this point  - Steroid taper as above  - Doxy as above  - Bronchodilator regiment as above - dulera and spiriva.  Hypoxemia:  - Titrate O2 for sat of 88-92%  Chronic respiratory failure:  -  BiPAP at night and when asleep as outpatient  Palliative encounter:  - Needs morphine for palliation  - Will likely need to be addressed farther as outpatient  PCCM will sign off, please call back if needed.  Patient seen and examined, agree with above note.  I dictated the care and orders written for this patient under my direction.  Alyson ReedyYacoub, Wesam G, MD [phone]

## 2017-03-14 NOTE — Care Management Important Message (Signed)
Important Message  Patient Details  Name: [name] MRN: [id number] Date of Birth: 1935-03-16   Medicare Important Message Given:  Yes    Stormee Duda Abena 03/14/2017, 11:06 AM

## 2017-03-14 NOTE — Progress Notes (Signed)
PT Cancellation Note  Patient Details Name: [name] MRN: [id number] DOB: 03-21-1935   Cancelled Treatment:    Reason Eval/Treat Not Completed: Patient declined, no reason specified Pt declined and is getting ready for d/c.    Derek MoundKellyn R Cayenne Breault Hildegarde Dunaway, PTA Pager: [phone]   03/14/2017, 4:02 PM

## 2017-03-15 ENCOUNTER — Other Ambulatory Visit: Payer: Self-pay

## 2017-03-15 DIAGNOSIS — J441 Chronic obstructive pulmonary disease with (acute) exacerbation: Secondary | ICD-10-CM

## 2017-03-15 NOTE — Discharge Summary (Signed)
Physician Discharge Summary  Sheri SleeperHazel H Mcdonald ZOX:[id number] DOB: [birth year] DOA: 03/07/2017  PCP: Alysia PennaHolwerda, Scott, MD  Admit date: 03/07/2017 Discharge date: 03/15/2017  Admitted From: Home Disposition:  Home with Home Health as patient refused SNF  Recommendations for Outpatient Follow-up:  1. Follow up with PCP in 1-2 weeks 2. Follow up with Pulmonology Dr. Delton CoombesByrum at next scheduled appointment 3. Follow up with Dr. Tobias AlexanderKatarina Nelson in Cardiology as an outpatient 4. Palliative Care Medicine to follow up as an outpatient  5. Please obtain CMP/CBC, Mag, Phos in one week 6. Please follow up on the following pending results:  Home Health: Yes; Patient initially wanted to go to SNF but then refused  Equipment/Devices: None Recommended  Discharge Condition: Stable  CODE STATUS: Partial; DNR at D/C Diet recommendation: Heart Healthy Diet  Brief/Interim Summary: Ernestyne H Tuckeris a pleasant 82 y.o.femalewith medical history significant for hypoxic respiratory failure on home oxygen related to COPD, diastolic dysfunction, hypertension, CAD, anemia, anxiety who presented to the emergency department with the chief complaint of worsening shortness of breath. Initial evaluation reveals acute on chronic respiratory failure likely related to COPD exacerbation. She stated that over the last week she's experience worsening shortness of breath. She is also had intermittent productive cough and mild increase in lower extremity edema. She denies chest pain palpitations headache dizziness syncope or near-syncope. She denies any abdominal pain nausea vomiting diarrhea constipation melena or bright red blood per rectum. She saw her neurologist 3 weeks ago she was placed on prednisone which she's completed. She also reports she saw a cardiologist around the same time increase her Lasix from 20-40 mg. She was admitted for Acute on Chronic Hypoxic Respiratory Failure from COPD Exacerbation. Palliative Care Medicine and  Pulmonary were consulted for further evaluation and management. Patient slowly improved. PT re-evaluated and recommending SNF but patient refused. Patient was deemed medically stable to D/C home and follow up with PCP, Cardiology, and Pulmonary as an outpatient. Palliative Care is to follow patient as an outpatient as well.   Discharge Diagnoses:  Principal Problem:   Acute on chronic respiratory failure (HCC) Active Problems:   HTN (hypertension)   CAD (coronary artery disease), native coronary artery   HLD (hyperlipidemia)   Hypertrophic cardiomyopathy (HCC)   Anxiety   Iron deficiency anemia   Tachycardia   Diastolic dysfunction   COPD with acute exacerbation (HCC)   Hyperglycemia   Goals of care, counseling/discussion   Palliative care by specialist   Palliative care encounter   SOB (shortness of breath)  1. Acute on Chronic respiratory failure requiring NIPPV likely related to COPD with acute exacerbation. [facility]  -Patient is on home oxygen at 2-3 L 24/7.  -Initial Chest x-ray reveals hyperinflation with emphysematous changes and scarring in the right lung. No infiltrate.  -Admission EKG was tachycardic at 101 with atrial premature complex right atrial enlargement.  -Time of admission she was on BiPAP with moderate increased work of breathing and oxygen saturation level 88% on 3 L nasal cannula; C/w BiPAP qHS and Prn -Goal is to Maintain O2 Saturations 90-95% -Admitted to Step Down -Scheduled Ipratropium 0.5 mg q6h with Levalbuterol 0.63 q6h now D/C'd by Pulmonary. C/w Albuterol 2.5 mg Neb q2hprn wheezing and Home Inhalers -C/w Dulera and Spiriva -IV Methylprednisolone 40 mg q12h changed to Sterapred Taper. Started PPI given Long term Steroid use -S/p 5 Days of Doxycycline -Monitor respiratory effort and oxygen saturation level and put back on BiPAP as indicated -  Added Flutter Valve and increased Guaifenesin.  -Checked ECHOcardiogram and showed Systolic  function was normal. The estimated ejection fraction was in the range of 55% to 60%. Wall motion was normal; there were no regional wall motion abnormalities. Doppler parameters are consistent with abnormal left ventricular relaxation (grade 1 diastolic dysfunction). -Pulmonary followed and appreciated evaluation and recommendations  -PT/OT Evaluated and Recommend SNF but patient to go home with Home Health as she refused SNF -Follow up with Pulmonary Dr. Delton Coombes on 03/21/17 and will need BiPAP at night and when asleep as an outpatient. Dr. Delton Coombes to arrange for Outpatient BiPAP  2. COPD with Acute Exacerbation.  -Chart review indicates patient has had worsening shortness of breath over the last couple of weeks.  -Saw her pulmonology M.D. 3 weeks ago. Office note indicates she was given a course of prednisone, antibiotics were deferred at that time.  -Office note also indicates considering changing to nebulized LAMA, LABA and ICS.In addition office note indicates patient may benefit from palliative care assessment -Nebulizers as noted above -Continue Dulera substitution; Spiriva restarted by Pulmonary -C/w Morphine Concentrate 2.6-5 mg SL q2hprn for SOB given at D/C -C/w Magnesium po 500 mg Daily -Solu-Medrol changed to po Prednisone Taper as above -Checked Respiratory Virus Panel and was Negative and Checked ECHO which showed EF of 55-60% with G1DD -Pulmonary consulted for further evaluation and management -Repeat CXR 8/30 showed COPD  -Repeat CXR on 9/1 showed slight hyperinflation, no acute cardiopulmonary disease and aortic atherosclerosis  -Palliative care consulted for Goals of Care and Psychospiritual Support; C/w Current care but no escalation in Care and patient is DNR at D/C [facility] for BiPAP but no Intubation) -PT/OT Initially recommending Home Health PT but patient feels weaker and re-evaluation recommended SNF but patient refused and will be D/C'd Home.   3. Tachycardia/CAD.  -Likely  related to nebs. Mild.; Nebs now D/C'd -Initial troponin negative.  -Patient denies chest pain.  -Review indicates recent Myoview low risk -Monitored on telemetry -Continue home meds and Home Nebivolol   4. Hyperglycemia.  -Likely steroid-induced. CBG's ranging from 90-139 -Hemoglobin A1c was 5.6 -C/w Sensitive Novolog Sliding scale insulin AC  5. Hypertension.  -Home medications include Bystolic, Lasix, Cozaar -Continue Home Meds; Lasix Dose 40 mg po Daily instead of BID -Continue to Monitor -Follow up with PCP as an outpatient   6. Diastolic dysfunction/Hypertrophic Cardiomyopathy.  -Appears stable at baseline  -Chart review indicates office visit with cardiology 2 weeks ago. Echo in March 2017 showed vigorous LVEF with an EF of 65% mild grade 1 diastolic dysfunction. -Repeat ECHOCARDIOGRAM as below -Daily weights -Monitor intake and output; Patient was -2.830 Liters -Held Lasix initially but now restarted and is taking 40 mg daily -Follow up with PCP and Cardiology Dr. Delton See as an outpatient   7. Acute Kidney Injury.  -Creatinine 1.26 on Admission. Likely related decreased oral intake in the setting of Lasix and Cozaar. -IVF D/C'd and Lasix restarted; Changed po Lasix 40 mg BID to 40 mg Daily because of worsening Cr yesterday and Cr improved  -BUN/Cr went from 31/0.94 -> 34/1.03 -> 40/1.12 -> 41/0.97 -> 43/0.94 -> 46/1.14 -> 53/1.19 -Repeat CMP ias an outpatient   8. Iron Deficiency Anemia.  -Hemoglobin 11.4 on admission. Hb/Hct Dropped to 9.7/30.4. ?Dilutional Drop but now has Improved back to 11.3/35.2 -Continue Home Meds as an outpatient   9. Anxiety. -Patient appears slightly anxious. -Continue home meds of Clonazepam 0.25 mg po BID  10. Leukocytosis -Mild as WBC went from  9.6 -> 12.0 and now improved to 10.2 -Likely from IV Steroid Demargination; Steroids being weaned -Continue to Monitor for S/Sx of Infection  11. Constipation, improved -Had not had a  BM since Tuesday -C/w Miralax 17 grams po BID and Senna-Docusate 2 Tab po BID  12. BUN/Cr Dissociation -Steroid mediated as BUN 53 and Cr 1.19 -Continue to Monitor   13. Bilateral Bruising in AC's  -Vascular U/S Bilaterally showed No sonographic findings of mass lesion or cystic lesion in the left or right antecubital region -Continue Warm Compresses  -Follow up as an outpatient; Platelet Count Fine   Discharge Instructions  Discharge Instructions    Call MD for:  difficulty breathing, headache or visual disturbances    Complete by:  As directed    Call MD for:  extreme fatigue    Complete by:  As directed    Call MD for:  hives    Complete by:  As directed    Call MD for:  persistant dizziness or light-headedness    Complete by:  As directed    Call MD for:  persistant nausea and vomiting    Complete by:  As directed    Call MD for:  redness, tenderness, or signs of infection (pain, swelling, redness, odor or green/yellow discharge around incision site)    Complete by:  As directed    Call MD for:  severe uncontrolled pain    Complete by:  As directed    Call MD for:  temperature >100.4    Complete by:  As directed    Diet - low sodium heart healthy    Complete by:  As directed    Discharge instructions    Complete by:  As directed    Follow up with PCP, Pulmonology, and Cardiology as an outpatient. Follow up and have labs drawn within 1 week. Take all medications as prescribed. If symptoms change or worsen please return to the ED for evaluation.   Increase activity slowly    Complete by:  As directed      Allergies as of 03/14/2017      Reactions   Levaquin [levofloxacin In D5w] Other (See Comments)   Can't sleep    Penicillins Hives   Has taken it since.   Pneumococcal Vaccine Swelling   Severe localized swelling in arm   Pneumovax [pneumococcal Polysaccharide Vaccine] Hives      Medication List    TAKE these medications   albuterol 108 (90 Base) MCG/ACT  inhaler Commonly known as:  PROVENTIL HFA;VENTOLIN HFA Inhale 2 puffs into the lungs every 6 (six) hours as needed. For shortness of breath   aspirin 81 MG tablet Take 81 mg by mouth daily.   aspirin-acetaminophen-caffeine 250-250-65 MG tablet Commonly known as:  EXCEDRIN MIGRAINE Take 1 tablet by mouth every 6 (six) hours as needed for headache.   atorvastatin 20 MG tablet Commonly known as:  LIPITOR Take 1 tablet (20 mg total) by mouth daily.   budesonide-formoterol 160-4.5 MCG/ACT inhaler Commonly known as:  SYMBICORT Inhale 2 puffs into the lungs 2 (two) times daily.   BYSTOLIC 5 MG tablet Generic drug:  nebivolol TAKE 1 TABLET (5 MG TOTAL) BY MOUTH DAILY.   CALCIUM 600 + D PO Take 1 tablet by mouth daily.   clonazePAM 0.5 MG tablet Commonly known as:  KLONOPIN Take 0.5 tablets (0.25 mg total) by mouth 2 (two) times daily.   furosemide 40 MG tablet Commonly known as:  LASIX Take 1 tablet (40  mg total) by mouth daily. What changed:  when to take this   guaiFENesin 600 MG 12 hr tablet Commonly known as:  MUCINEX Take 2 tablets (1,200 mg total) by mouth 2 (two) times daily.   losartan 50 MG tablet Commonly known as:  COZAAR Take 1 tablet (50 mg total) by mouth daily.   Magnesium 500 MG Caps Take 1 capsule by mouth daily.   morphine CONCENTRATE 10 mg / 0.5 ml concentrated solution Place 0.13-0.25 mLs (2.6-5 mg total) under the tongue every 2 (two) hours as needed for shortness of breath.   multivitamin tablet Take 1 tablet by mouth daily.   ondansetron 4 MG tablet Commonly known as:  ZOFRAN Take 1 tablet (4 mg total) by mouth every 6 (six) hours as needed for nausea.   pantoprazole 40 MG tablet Commonly known as:  PROTONIX Take 1 tablet (40 mg total) by mouth daily.   polyethylene glycol packet Commonly known as:  MIRALAX / GLYCOLAX Take 17 g by mouth 2 (two) times daily.   predniSONE 10 MG (21) Tbpk tablet Commonly known as:  STERAPRED UNI-PAK 21  TAB Take 6 pills Day 1, 5 Pills Day 2, 4 Pills Day 3, 3 Pills Day 4, 2 Pills Day 5, 1 Pill Day 6, and Stop on Day 7   senna-docusate 8.6-50 MG tablet Commonly known as:  Senokot-S Take 2 tablets by mouth 2 (two) times daily.   SPIRIVA RESPIMAT 2.5 MCG/ACT Aers Generic drug:  Tiotropium Bromide Monohydrate INHALE 2 PUFFS INTO THE LUNGS DAILY.   traZODone 50 MG tablet Commonly known as:  DESYREL Take 0.5 tablets (25 mg total) by mouth at bedtime as needed for sleep.   UNABLE TO FIND Inhale 1.5-3 L into the lungs continuous.            Discharge Care Instructions        Start     Ordered   03/15/17 0000  furosemide (LASIX) 40 MG tablet  Daily     03/14/17 1452   03/15/17 0000  pantoprazole (PROTONIX) 40 MG tablet  Daily     03/14/17 1452   03/14/17 0000  guaiFENesin (MUCINEX) 600 MG 12 hr tablet  2 times daily     03/14/17 1452   03/14/17 0000  polyethylene glycol (MIRALAX / GLYCOLAX) packet  2 times daily     03/14/17 1452   03/14/17 0000  senna-docusate (SENOKOT-S) 8.6-50 MG tablet  2 times daily     03/14/17 1452   03/14/17 0000  Morphine Sulfate (MORPHINE CONCENTRATE) 10 mg / 0.5 ml concentrated solution  Every 2 hours PRN     03/14/17 1452   03/14/17 0000  ondansetron (ZOFRAN) 4 MG tablet  Every 6 hours PRN     03/14/17 1452   03/14/17 0000  traZODone (DESYREL) 50 MG tablet  At bedtime PRN     03/14/17 1452   03/14/17 0000  predniSONE (STERAPRED UNI-PAK 21 TAB) 10 MG (21) TBPK tablet     03/14/17 1541   03/14/17 0000  Increase activity slowly     03/14/17 1550   03/14/17 0000  Diet - low sodium heart healthy     03/14/17 1550   03/14/17 0000  Discharge instructions    Comments:  Follow up with PCP, Pulmonology, and Cardiology as an outpatient. Follow up and have labs drawn within 1 week. Take all medications as prescribed. If symptoms change or worsen please return to the ED for evaluation.   03/14/17 1550  03/14/17 0000  Call MD for:  temperature >100.4      03/14/17 1550   03/14/17 0000  Call MD for:  persistant nausea and vomiting     03/14/17 1550   03/14/17 0000  Call MD for:  severe uncontrolled pain     03/14/17 1550   03/14/17 0000  Call MD for:  redness, tenderness, or signs of infection (pain, swelling, redness, odor or green/yellow discharge around incision site)     03/14/17 1550   03/14/17 0000  Call MD for:  difficulty breathing, headache or visual disturbances     03/14/17 1550   03/14/17 0000  Call MD for:  hives     03/14/17 1550   03/14/17 0000  Call MD for:  persistant dizziness or light-headedness     03/14/17 1550   03/14/17 0000  Call MD for:  extreme fatigue     03/14/17 1550      Allergies  Allergen Reactions  . Levaquin [Levofloxacin In D5w] Other (See Comments)    Can't sleep   . Penicillins Hives    Has taken it since.  . Pneumococcal Vaccine Swelling    Severe localized swelling in arm  . Pneumovax [Pneumococcal Polysaccharide Vaccine] Hives   Consultations:  Pulmonology  Palliative Care Medicine  Procedures/Studies: Dg Chest Port 1 View  Result Date: 03/11/2017 CLINICAL DATA:  Shortness of breath and COPD. EXAM: PORTABLE CHEST 1 VIEW COMPARISON:  One-view chest x-ray 03/09/2017. FINDINGS: Heart size is normal. Atherosclerotic changes are noted at the aortic arch. Lungs are hyperinflated. There is no edema or effusion. No focal airspace disease is present. The visualized soft tissues and bony thorax are unremarkable. IMPRESSION: 1. Stable hyperinflation. 2. No acute cardiopulmonary disease. 3. Aortic atherosclerosis. Electronically Signed   By: Marin Roberts M.D.   On: 03/11/2017 09:45   Dg Chest Port 1 View  Result Date: 03/09/2017 CLINICAL DATA:  Shortness of Breath EXAM: PORTABLE CHEST 1 VIEW COMPARISON:  03/07/2017 FINDINGS: There is hyperinflation of the lungs compatible with COPD. Heart and mediastinal contours are within normal limits. Scarring in the right upper lobe. Otherwise, no focal  opacities or effusions. No acute bony abnormality. IMPRESSION: COPD.  No active disease. Electronically Signed   By: Charlett Nose M.D.   On: 03/09/2017 10:30   Dg Chest Portable 1 View  Result Date: 03/07/2017 CLINICAL DATA:  Shortness of breath EXAM: PORTABLE CHEST 1 VIEW COMPARISON:  02/08/2017 FINDINGS: Hyperinflation. Right lower probable nipple shadow. No acute consolidation or effusion. Stable cardiomediastinal silhouette with atherosclerosis. Emphysematous changes. Scarring in the right upper lobe. No pneumothorax. IMPRESSION: Hyperinflation with emphysematous changes and scarring in the right lung a PACs. No acute infiltrate or edema Electronically Signed   By: Jasmine Pang M.D.   On: 03/07/2017 04:20   [place] Rt Upper Extrem Ltd Soft Tissue Non Vascular  Result Date: 03/13/2017 CLINICAL DATA:  Antecubital hematoma EXAM: ULTRASOUND right UPPER EXTREMITY LIMITED TECHNIQUE: Ultrasound examination of the upper extremity soft tissues was performed in the area of clinical concern. COMPARISON:  None FINDINGS: In the region of erythema and swelling we demonstrate no mass lesion or cystic lesion. IMPRESSION: 1. No specific antecubital lesion is observed sonographically on the right. Electronically Signed   By: Gaylyn Rong M.D.   On: 03/13/2017 14:15   [place] Lt Upper Extrem Ltd Soft Tissue Non Vascular  Result Date: 03/13/2017 CLINICAL DATA:  Left antecubital soft tissue swelling EXAM: ULTRASOUND Left UPPER EXTREMITY LIMITED TECHNIQUE: Ultrasound examination of the  upper extremity soft tissues was performed in the area of clinical concern. COMPARISON:  None FINDINGS: In the left antecubital region we demonstrate no abnormal masslike appearance or cystic lesion. IMPRESSION: 1. No sonographic findings of mass lesion or cystic lesion in the left antecubital region. Electronically Signed   By: Gaylyn Rong M.D.   On: 03/13/2017 14:16    ECHOCARDIOGRAM Study Conclusions  - Left ventricle: The  cavity size was normal. There was mild focal   basal hypertrophy of the septum. Systolic function was normal.   The estimated ejection fraction was in the range of 55% to 60%.   Wall motion was normal; there were no regional wall motion   abnormalities. Doppler parameters are consistent with abnormal   left ventricular relaxation (grade 1 diastolic dysfunction).   Doppler parameters are consistent with indeterminate ventricular   filling pressure. - Aortic valve: Transvalvular velocity was within the normal range.   There was no stenosis. There was no regurgitation. - Mitral valve: Moderately calcified annulus. Transvalvular   velocity was within the normal range. There was no evidence for   stenosis. There was mild regurgitation. - Right ventricle: The cavity size was normal. Wall thickness was   normal. Systolic function was normal. - Atrial septum: No defect or patent foramen ovale was identified. - Tricuspid valve: There was no regurgitation.  Subjective: Seen and examined at bedside and was still feeling SOB but felt better and close to baseline. No Nausea or vomiting. Wanting to go home rather than SNF.   Discharge Exam: Vitals:   03/14/17 0842 03/14/17 1210  BP: 130/66   Pulse:  83  Resp:    Temp:    SpO2:  97%   Vitals:   03/14/17 0043 03/14/17 0443 03/14/17 0842 03/14/17 1210  BP: 109/72 119/64 130/66   Pulse: 70 69  83  Resp: 16 18    Temp: 98.7 F (37.1 C) 98.5 F (36.9 C)    TempSrc: Oral Oral    SpO2: 100% 100%  97%  Weight:  58.2 kg (128 lb 4.9 oz)    Height:       General: Pt is alert, awake, not in acute distress Cardiovascular: RRR, S1/S2 +, no rubs, no gallops Respiratory: Diminished bilaterally, no wheezing, no rhonchi Abdominal: Soft, NT, ND, bowel sounds + Extremities: Trace edema, no cyanosis; Has bilateral UE Ecchymosis   The results of significant diagnostics from this hospitalization (including imaging, microbiology, ancillary and laboratory)  are listed below for reference.    Microbiology: Recent Results (from the past 240 hour(s))  Respiratory Panel by PCR     Status: None   Collection Time: 03/10/17  3:48 PM  Result Value Ref Range Status   Adenovirus NOT DETECTED NOT DETECTED Final   Coronavirus 229E NOT DETECTED NOT DETECTED Final   Coronavirus HKU1 NOT DETECTED NOT DETECTED Final   Coronavirus NL63 NOT DETECTED NOT DETECTED Final   Coronavirus OC43 NOT DETECTED NOT DETECTED Final   Metapneumovirus NOT DETECTED NOT DETECTED Final   Rhinovirus / Enterovirus NOT DETECTED NOT DETECTED Final   Influenza A NOT DETECTED NOT DETECTED Final   Influenza B NOT DETECTED NOT DETECTED Final   Parainfluenza Virus 1 NOT DETECTED NOT DETECTED Final   Parainfluenza Virus 2 NOT DETECTED NOT DETECTED Final   Parainfluenza Virus 3 NOT DETECTED NOT DETECTED Final   Parainfluenza Virus 4 NOT DETECTED NOT DETECTED Final   Respiratory Syncytial Virus NOT DETECTED NOT DETECTED Final   Bordetella pertussis NOT DETECTED  NOT DETECTED Final   Chlamydophila pneumoniae NOT DETECTED NOT DETECTED Final   Mycoplasma pneumoniae NOT DETECTED NOT DETECTED Final    Labs: BNP (last 3 results)  Recent Labs  09/19/16 1148 03/07/17 0335  BNP 38.9 61.5   Basic Metabolic Panel:  Recent Labs Lab 03/10/17 0247 03/11/17 0533 03/12/17 0254 03/13/17 0233 03/14/17 0453  NA 140 140 140 140 136  K 3.9 4.1 4.2 4.2 4.5  CL 100* 100* 99* 96* 96*  CO2 32 30 33* 36* 32  GLUCOSE 133* 136* 122* 147* 99  BUN 40* 41* 43* 46* 53*  CREATININE 1.12* 0.97 0.94 1.14* 1.19*  CALCIUM 8.5* 8.7* 8.8* 9.0 8.8*  MG 2.3 2.5* 2.4 2.7* 2.6*  PHOS 3.9 3.7 3.4 4.2 4.6   Liver Function Tests:  Recent Labs Lab 03/10/17 0247 03/11/17 0533 03/12/17 0254 03/13/17 0233 03/14/17 0453  AST 23 17 20 20 20   ALT 22 21 22 24 20   ALKPHOS 34* 31* 30* 33* 30*  BILITOT 0.5 0.3 0.4 0.6 0.6  PROT 6.0* 5.7* 5.5* 5.8* 5.6*  ALBUMIN 3.3* 3.3* 3.3* 3.4* 3.2*   No results for  input(s): LIPASE, AMYLASE in the last 168 hours. No results for input(s): AMMONIA in the last 168 hours. CBC:  Recent Labs Lab 03/10/17 0247 03/11/17 0533 03/12/17 0254 03/13/17 0233 03/14/17 0453  WBC 12.9* 9.8 9.6 12.0* 10.2  NEUTROABS 11.6* 8.4* 8.0* 10.4* 7.8*  HGB 10.2* 10.6* 10.5* 11.2* 11.3*  HCT 32.0* 33.0* 32.3* 34.8* 35.2*  MCV 94.7 93.0 93.4 94.6 93.4  PLT 340 341 344 368 319   Cardiac Enzymes: No results for input(s): CKTOTAL, CKMB, CKMBINDEX, TROPONINI in the last 168 hours. BNP: Invalid input(s): POCBNP CBG:  Recent Labs Lab 03/13/17 1629 03/13/17 1716 03/13/17 2059 03/14/17 0610 03/14/17 1202  GLUCAP 107* 105* 126* 90 139*   D-Dimer No results for input(s): DDIMER in the last 72 hours. Hgb A1c No results for input(s): HGBA1C in the last 72 hours. Lipid Profile No results for input(s): CHOL, HDL, LDLCALC, TRIG, CHOLHDL, LDLDIRECT in the last 72 hours. Thyroid function studies No results for input(s): TSH, T4TOTAL, T3FREE, THYROIDAB in the last 72 hours.  Invalid input(s): FREET3 Anemia work up No results for input(s): VITAMINB12, FOLATE, FERRITIN, TIBC, IRON, RETICCTPCT in the last 72 hours. Urinalysis    Component Value Date/Time   COLORURINE YELLOW 10/16/2009 2023   APPEARANCEUR CLEAR 10/16/2009 2023   LABSPEC 1.018 10/16/2009 2023   PHURINE 6.0 10/16/2009 2023   GLUCOSEU NEGATIVE 10/16/2009 2023   HGBUR NEGATIVE 10/16/2009 2023   BILIRUBINUR NEGATIVE 10/16/2009 2023   KETONESUR 15 (A) 10/16/2009 2023   PROTEINUR 30 (A) 10/16/2009 2023   UROBILINOGEN 0.2 10/16/2009 2023   NITRITE NEGATIVE 10/16/2009 2023   LEUKOCYTESUR NEGATIVE 10/16/2009 2023   Sepsis Labs Invalid input(s): PROCALCITONIN,  WBC,  LACTICIDVEN Microbiology Recent Results (from the past 240 hour(s))  Respiratory Panel by PCR     Status: None   Collection Time: 03/10/17  3:48 PM  Result Value Ref Range Status   Adenovirus NOT DETECTED NOT DETECTED Final   Coronavirus  229E NOT DETECTED NOT DETECTED Final   Coronavirus HKU1 NOT DETECTED NOT DETECTED Final   Coronavirus NL63 NOT DETECTED NOT DETECTED Final   Coronavirus OC43 NOT DETECTED NOT DETECTED Final   Metapneumovirus NOT DETECTED NOT DETECTED Final   Rhinovirus / Enterovirus NOT DETECTED NOT DETECTED Final   Influenza A NOT DETECTED NOT DETECTED Final   Influenza B NOT DETECTED NOT DETECTED  Final   Parainfluenza Virus 1 NOT DETECTED NOT DETECTED Final   Parainfluenza Virus 2 NOT DETECTED NOT DETECTED Final   Parainfluenza Virus 3 NOT DETECTED NOT DETECTED Final   Parainfluenza Virus 4 NOT DETECTED NOT DETECTED Final   Respiratory Syncytial Virus NOT DETECTED NOT DETECTED Final   Bordetella pertussis NOT DETECTED NOT DETECTED Final   Chlamydophila pneumoniae NOT DETECTED NOT DETECTED Final   Mycoplasma pneumoniae NOT DETECTED NOT DETECTED Final   Time coordinating discharge: 35 minutes  SIGNED:  Merlene Laughter, DO [facility] 03/15/2017, 10:22 AM Pager [phone]  If 7PM-7AM, please contact night-coverage [email or web address] Password [email or web address]

## 2017-03-16 ENCOUNTER — Other Ambulatory Visit: Payer: Self-pay | Admitting: *Deleted

## 2017-03-17 NOTE — Patient Outreach (Addendum)
[facility](THN) Care Management  03/17/2017  [name] [birth year] [id number]  Transition of Care Referral  Referral Date:03/16/17 Referral Source: Inpatient Referral [name]([place]) Date of Discharge: 03/15/17 [facility] Discharge Diagnosis: Acute on Chronic Respiratory Failure Insurance: [employer] Medicare  Outreach attempt # 1, spoke with patient. HIPAA verified with patient. Patient lives with her husband. She is independent/assist with ADLs. She is dependent upon transportation to her medical appointments. She uses a wheelchair and a rolling walker to assist with mobility. She is dependent on Oxygen at 4 Liters.   Conditions: Past Medical Hx: CHF, COPD, HTN, HLD, Iron Deficiency, HLD, Anxiety, Hypertrophic Cardiomyopathy Patient reported, she has Stage 4 COPD and dependent upon oxygen at 4 liters She refused [facility]. She stated, "she didn't feel it was appropriate for her at this time". She acknowledged having a history of CHF. She has requested and is appreciative of having a [facility] to assist with her medical conditions.   Appointments: Patient has an upcoming pulmonary appointment on 03/21/17.   Plan: RN CM will send referral to [facility] Community [facility] for further in home eval/assessment of care needs and management of chronic conditions. RN CM advised patient to contact RN CM for any needs or concerns.  Sheri ClevelandJuanita Evonte Prestage, RN, BSN, MHA/MSL, [facility] Telephonic Care Manager Coordinator [facility] Direct Phone: [phone] Toll Free: [phone] Fax: [phone]

## 2017-03-20 ENCOUNTER — Other Ambulatory Visit: Payer: Self-pay | Admitting: *Deleted

## 2017-03-20 ENCOUNTER — Other Ambulatory Visit: Payer: Self-pay

## 2017-03-20 NOTE — Patient Outreach (Signed)
[facility](THN) Care Management  03/20/2017  Sheri SleeperHazel H Mcdonald 05-Oct-1934 [id number]   Subjective: "I feel a little better"  Objective: none  Assessment: 82 year old female with recent admission 8/28-9/5 for COPD exacerbation. History of emphysema, COPD, heart failure, CAD, HTN, hyperlipidemia, oxygen at 2liters/nasal cannula. Client denies home health services, per chart home health services refused. Per chart referral made to [facility] program.  RNCM received referral for transition of care. RNCM spoke with both client and her husband per client's request.   Re: COPD-Mr. [name] reports that yesterday, client was more short of breath. They treated her with her medications and morphine and is better today. Client has an appointment with her pulmonologist tomorrow. Mr. [name] reports his is knowledgeable of the COPD zone tool. RNCM will continue to reinforce.  History of heart failure. Mr. [name] states that he noticed client's ankles feet with more swelling. He reports client was on More lasix while in the hospital and her home medication dose is less. Patient last saw cardiologist in August and per chart is not due to be seen until January. Client reports she weighs daily and has not noticed an increase in weights greater than 2-3 pounds or 5 pounds in a week, but client did not weigh today. Mr. [name] reports her weights range between 122-123. RNCM encouraged client to call cardiologist to notify of recent admission for COPD and discuss increase in Edema of lower extremities.  RNCM discussed upcoming appoints and was informed by primary care office that since client was seeing pulmonologist as soon as they were that she did not need to be seen by primary care at this time. Client's husband to call cardiologist office to discuss plan of care.  RNCM provided contact number and encouraged client to call as needed. RNCM also reiterated 24 hour nurse advice line as a resource to  call as needed.  Plan: home visit next week.  Sheri SheriffJuana Marene Gilliam, RN, MSN, [facility],CCM [facility] Community Care Coordinator Cell: [phone]

## 2017-03-20 NOTE — Patient Outreach (Signed)
[facility](THN) Care Management  03/20/2017  Terald SleeperHazel H Serviss 05/09/1935 [id number]   S: Incoming call from patient and spouse. Patient husband asked about a CPAP for the patient. He stated, patient did well in the hospital when she used the CPAP. Patient and spouse wanted to know if we could assist the patient with getting a CPAP machine. RN CM Dorann Lodge(Adine Heimann) informed patient and spouse to contact the Pulmonologist. Patient stated, she has an appointment scheduled with her pulmonary doctor on 03/21/17. Informed patient to address the CPAP during the doctor's visit. Both the patient and husband agreed. Encouraged patient and spouse to call RN CM Dorann Lodge(Gwen Sarvis) if her needs were not addressed during the MD visit.   Plan:  RN CM will notify [facility] Community RN assigned to patient case aware of patient's concerns.   Wynelle ClevelandJuanita Donivan Thammavong, RN, BSN, MHA/MSL, [facility] Telephonic Care Manager Coordinator [facility] Direct Phone: [phone] Toll Free: [phone] Fax: [phone]

## 2017-03-21 ENCOUNTER — Ambulatory Visit (INDEPENDENT_AMBULATORY_CARE_PROVIDER_SITE_OTHER): Payer: PPO | Admitting: Emergency Medicine

## 2017-03-21 ENCOUNTER — Encounter: Payer: Self-pay | Admitting: Emergency Medicine

## 2017-03-21 ENCOUNTER — Telehealth: Payer: Self-pay | Admitting: Emergency Medicine

## 2017-03-21 ENCOUNTER — Other Ambulatory Visit (INDEPENDENT_AMBULATORY_CARE_PROVIDER_SITE_OTHER): Payer: PPO

## 2017-03-21 VITALS — BP 98/68 | HR 97 | Ht 61.0 in | Wt 121.0 lb

## 2017-03-21 DIAGNOSIS — I5043 Acute on chronic combined systolic (congestive) and diastolic (congestive) heart failure: Secondary | ICD-10-CM | POA: Diagnosis not present

## 2017-03-21 DIAGNOSIS — J449 Chronic obstructive pulmonary disease, unspecified: Secondary | ICD-10-CM

## 2017-03-21 DIAGNOSIS — J9600 Acute respiratory failure, unspecified whether with hypoxia or hypercapnia: Secondary | ICD-10-CM

## 2017-03-21 DIAGNOSIS — I110 Hypertensive heart disease with heart failure: Secondary | ICD-10-CM

## 2017-03-21 DIAGNOSIS — J9612 Chronic respiratory failure with hypercapnia: Secondary | ICD-10-CM

## 2017-03-21 DIAGNOSIS — Z23 Encounter for immunization: Secondary | ICD-10-CM

## 2017-03-21 LAB — CBC
HEMATOCRIT: 35.2 % — AB (ref 36.0–46.0)
Hemoglobin: 11.9 g/dL — ABNORMAL LOW (ref 12.0–15.0)
MCHC: 33.8 g/dL (ref 30.0–36.0)
MCV: 92.7 fl (ref 78.0–100.0)
Platelets: 295 10*3/uL (ref 150.0–400.0)
RBC: 3.8 Mil/uL — ABNORMAL LOW (ref 3.87–5.11)
RDW: 12.9 % (ref 11.5–15.5)
WBC: 11.6 10*3/uL — AB (ref 4.0–10.5)

## 2017-03-21 LAB — BRAIN NATRIURETIC PEPTIDE: PRO B NATRI PEPTIDE: 94 pg/mL (ref 0.0–100.0)

## 2017-03-21 MED ORDER — IPRATROPIUM BROMIDE 0.02 % IN SOLN: 0.5000 mg | Freq: Four times a day (QID) | RESPIRATORY_TRACT | 12 refills | Status: DC

## 2017-03-21 MED ORDER — PREDNISONE 10 MG PO TABS: 20.0000 mg | ORAL_TABLET | Freq: Every day | ORAL | 0 refills | Status: DC

## 2017-03-21 NOTE — Patient Instructions (Signed)
Please increase your lasix back to 40mg  twice a day for now.  Lab work today Temporarily stop Spiriva Continue your Symbicort twice a day as you have been taking it Start atrovent nebulizer 4x a day We will restart prednisone 20mg  daily until next visit. We will discuss the dose at that time.  [facility] every night Get your home health assessment as planned next week. I would like for this to include identification of your equipment needs, physical therapy at home.  Continue your oxygen at 2-3 L/min at all times.  Follow with APP here in 2-3 weeks.  Follow with Dr Delton CoombesByrum next available to assess your status.

## 2017-03-21 NOTE — Progress Notes (Signed)
Subjective:     Patient ID: [name], female   DOB: 02/12/1935, 82 y.o.   MRN: [id number]  HPI 82 yo woman, former smoker (50 pk-yrs), hx COPD, HTN and diastolic CHF, PVD, Pulmonary nodular disease that was benign by serial CT scans of the chest, last done 12/06/16. She was admitted between 8/28 - 03/14/17 with an acute exacerbation and acute on chronic hypoxemic respiratory failure. She was discharged on pred taper to 0, finished yesterday. She reports that she currently comfortable at rest, but has absolutely no exercise tolerance, even with standing. She is having trouble with ADL's, showers. She refused nebs in the past and on her recent hospitalization, had more anxiety. She required BiPAP some overnight, benefited some. She unfortunately doesn't feel much better than she did either before or during the hospitalization.   No flowsheet data found.     Objective:   Physical Exam Vitals:   03/21/17 1202  BP: 98/68  Pulse: 97  SpO2: 95%  Weight: 121 lb (54.9 kg)  Height:  (1.549 m)   Gen: Pleasant, elderly woman, in no distress,  normal affect  ENT: No lesions,  mouth clear,  oropharynx clear, no postnasal drip  Neck: No JVD, no TMG, no carotid bruits  Lungs: No use of accessory muscles, distant, diminished BS in bases, no wheeze  Cardiovascular: RRR, heart sounds normal, no murmur or gallops, no peripheral edema  Musculoskeletal: No deformities, no cyanosis or clubbing  Neuro: alert, non focal  Skin: Warm, no lesions or rashes    CT scan chest , personally reviewed by me 11/26/14 --  COMPARISON: CT 05/08/2014 04/18/2012  FINDINGS: Mediastinum/Nodes: No axillary supraclavicular lymphadenopathy. No mediastinal hilar lymphadenopathy no pericardial fluid. Esophagus is normal.  Lungs/Pleura: The new sub solid nodule described on comparison CT is no longer measurable.  Additional solid nodules are not changed. For example 7 mm nodule in the right upper lobe on  image 27, series 3 is unchanged. Adjacent 6 mm nodule is also unchanged (image 26).  More superiorly in the right upper lobe band of linear nodular thickening measures 5 mm compared to 7 mm (image 11, series 3. This right upper lobe linear nodularity first appear CT of 05/02/2013 and has lengthed and thinned.  There is additional scattered sub 5 mm nodules, stable. 3 mm left upper lobe nodule image 17, unchanged  Upper abdomen: Limited view of the liver, kidneys, pancreas are unremarkable. Normal adrenal glands.  Musculoskeletal: No aggressive osseous lesion.  IMPRESSION: 1. Resolution of right upper lobe sub solid nodule. 2. Stable bilateral noncalcified nodules for greater than 2 years. 3. Band of linear nodular thickening at the right lung apex first presented on CT of 05/02/2013. This nodular thickening appears to continue to decrease in volume. Consider one additional follow-up for this right upper lobe bandlike nodular thickening in 6-12 months       Assessment:     COPD (chronic obstructive pulmonary disease) (HCC) Severe chronic disease. Clearly progressive functional decline. She unfortunately doesn't feel much better now that she did either before or during her recent hospitalization. I am concerned that she is approaching palliation and I discussed this with her today. She is already using morphine for symptom management. There may be some interventions that we can undertake that would help with her pulmonary function: We will try to changed Atrovent from Spiriva for better delivery (she does not tolerate nebulized albuterol due to agitation), we will try to obtain BiPAP daily at bedtime given  her documented chronic hypercapnia. We will start scheduled prednisone to see if she benefits. Start with 20 mg and taper to lowest effective dose. She has a home health assessment pending. I suspect she'll also need up out of care assessment. She would qualify although she is  somewhat resistant at this time. I will continue to discuss this with her depending on what the home health assessment says. I do believe she needs home physical therapy. This would possibly even be a bridge to pulmonary rehabilitation.  She is to follow-up here in 2-3 weeks, either with me or with a APP  Hypertensive heart disease with CHF (congestive heart failure) (HCC) Her furosemide was decreased to 40 mg daily during her recent hospitalization. She does have ankle edema today. I will check her BMP and increase her Lasix back to 40 mg twice a day. She likely needs a repeat BMP when she comes back to our office. Depending on the results we will determine her Lasix dosing  Greater than 30 minutes of 45 minutes visit spent in counseling, especially regarding the progression of her disease, the options for therapy as well as home assistance, symptom management through Palliative Care.   Levy Pupaobert Vic Esco, MD, PhD 03/21/2017, 12:57 PM [facility] [phone] or if no answer [phone]

## 2017-03-21 NOTE — Assessment & Plan Note (Signed)
Her furosemide was decreased to 40 mg daily during her recent hospitalization. She does have ankle edema today. I will check her BMP and increase her Lasix back to 40 mg twice a day. She likely needs a repeat BMP when she comes back to our office. Depending on the results we will determine her Lasix dosing

## 2017-03-21 NOTE — Telephone Encounter (Signed)
[facility] and would go ahead and process that, but they could not process the order for a bipap machine. Patient would be considered to be a new start for bipap therapy.   Per Barbara CowerJason, patient will need a sleep study and a documented failure of a CPAP machine in order to qualify for the Bipap machine.   RB, do you want her to go through the new start steps? Please advise. Thanks!

## 2017-03-21 NOTE — Assessment & Plan Note (Addendum)
Severe chronic disease. Clearly progressive functional decline. She unfortunately doesn't feel much better now that she did either before or during her recent hospitalization. I am concerned that she is approaching palliation and I discussed this with her today. She is already using morphine for symptom management. There may be some interventions that we can undertake that would help with her pulmonary function: We will try to changed Atrovent from Spiriva for better delivery (she does not tolerate nebulized albuterol due to agitation), we will try to obtain BiPAP daily at bedtime given her documented chronic hypercapnia. We will start scheduled prednisone to see if she benefits. Start with 20 mg and taper to lowest effective dose. She has a home health assessment pending. I suspect she'll also need up out of care assessment. She would qualify although she is somewhat resistant at this time. I will continue to discuss this with her depending on what the home health assessment says. I do believe she needs home physical therapy. This would possibly even be a bridge to pulmonary rehabilitation.  She is to follow-up here in 2-3 weeks, either with me or with a APP

## 2017-03-21 NOTE — Telephone Encounter (Signed)
She should qualify based on her blood gas results and a dx of hypercapneic resp failure. Ask them what we need to do to make this happen

## 2017-03-21 NOTE — Addendum Note (Signed)
Addended by: Cornell BarmanWHITTAKER, KELLI M on: 03/21/2017 01:19 PM   Modules accepted: Orders

## 2017-03-22 ENCOUNTER — Other Ambulatory Visit: Payer: Self-pay

## 2017-03-22 ENCOUNTER — Telehealth: Payer: Self-pay | Admitting: Emergency Medicine

## 2017-03-22 DIAGNOSIS — J449 Chronic obstructive pulmonary disease, unspecified: Secondary | ICD-10-CM

## 2017-03-22 NOTE — Patient Outreach (Signed)
[facility](THN) Care Management  03/22/2017  Terald SleeperHazel H Sykora 1934/12/29 [id number]   Care Coordination: RNCM called Pulmonology office, Dr. Delton CoombesByrum. Left message regarding patient/family request for home health nursing. Mr. Pricilla Holmucker request [facility].  Care Coordination: RNCM called [facility]-spoke with Drenda FreezeFran who reports she has not received referral regarding client. Referral made at this time.  Plan: continue to follow. Home visit scheduled for next week.  Kathyrn SheriffJuana Carrissa Taitano, RN, MSN, [facility]mont[facility],CCM [facility] Community Care Coordinator Cell: [phone]

## 2017-03-22 NOTE — Telephone Encounter (Signed)
[facility]

## 2017-03-22 NOTE — Telephone Encounter (Signed)
OK to order iPAP 14/ ePAP 8, heated humidity, mask best fit

## 2017-03-22 NOTE — Patient Outreach (Signed)
[facility](THN) Care Management  03/22/2017  Terald SleeperHazel H Jeanmarie 1935/02/09 [id number]  Subjective: per Mr. Pricilla Holmucker, "it just takes so much out of her just to go to the doctor.Marland Kitchen.Marland Kitchen.She is doing just a little bit better today  Objective: none-telephonic  Assessment: 82 year old female with recent admission 8/28-9/5 for COPD exacerbation. History of emphysema, COPD, heart failure, CAD, HTN, hyperlipidemia, oxygen at 2liters/nasal cannula. Per chart, client declined home health services while in the hospital. Per chart referral made to [facility] program.  RNCM returned call to Mr. Durfee(caregiver)-two patient identifiers confirmed. Mr. Pricilla Holmucker reports client was seen at pulmonologist office on yesterday. Mr. Pricilla Holmucker inquired about a home health assessment for home health services. RNCM discussed the difference between home health and case management program.  Mr. Pricilla Holmucker states client is easily fatigued with increased shortness of breath. He discussed her medications changes. Per chart: nursing, physical therapy, occupational therapy and bath aide was recommended, but client declined these services at discharge. RNCM discussed role of each discipline and Mr. Pricilla Holmucker states his co-pays are high for each discipline. He adds that at this time he thinks client is too weak to benefit from physical therapy. He states she is exercising her arms a little bit and she has pedal machine that she can put in front of her chair for exercise. Currently, he is receptive to home health nursing coming in at this time. He states [facility] had [facility] in the past and would like to use this agency.  Per Mr. Pricilla Holmucker, client is taking her medications, using incentive spirometry, the flutter valve, taking her breathing treatments and using her inhalers as prescribed.  RNCM discussed care connections referral (referenced while in the hospital). Mr. Pricilla Holmucker states he has not heard from [facility].  Plan: RNCM will call Dr. Delton CoombesByrum to request order for home health nursing, RNCM will follow up with care connections program referral. Home visit to assess additional case management needs next week.  Kathyrn SheriffJuana Leandria Thier, RN, MSN, [facility] Center[facility],CCM [facility] Community Care Coordinator Cell: [phone]

## 2017-03-22 NOTE — Telephone Encounter (Signed)
Spoke with [name], he stated that he would be able to work with the blood gas results and Dx. However, he still needs settings on the bipap machine.    RB, please advise on the settings so that we can order the machine in the proper order format. Thanks!

## 2017-03-23 ENCOUNTER — Telehealth: Payer: Self-pay | Admitting: Emergency Medicine

## 2017-03-23 DIAGNOSIS — J449 Chronic obstructive pulmonary disease, unspecified: Secondary | ICD-10-CM

## 2017-03-23 DIAGNOSIS — J9600 Acute respiratory failure, unspecified whether with hypoxia or hypercapnia: Secondary | ICD-10-CM

## 2017-03-23 MED ORDER — IPRATROPIUM BROMIDE 0.02 % IN SOLN
0.5000 mg | Freq: Four times a day (QID) | RESPIRATORY_TRACT | 12 refills | Status: DC
Start: 2017-03-23 — End: 2017-03-29

## 2017-03-23 NOTE — Telephone Encounter (Signed)
Donn PieriniJuana returned call, [phone].

## 2017-03-23 NOTE — Telephone Encounter (Signed)
Called and spoke with [place] from [facility] and she stated that the pts husband called her today to see when she was going to come out and see the pt.   Donn PieriniJuana stated that her appt with the pt is not until 9-18.    She feels that the pt would benefit from a visiting RN and for assessment of pt and to see what other services the pt might need/qualify for.  RB please advise if you are ok with this.  Not sure that we would be able to order this from [facility]--but maybe [facility] could do this.

## 2017-03-23 NOTE — Telephone Encounter (Signed)
Attempted to call Donn PieriniJuana, no answer. Advised to call back to discuss.

## 2017-03-23 NOTE — Telephone Encounter (Signed)
Spoke with [name] and and he states the rx for atrovent did not get to the pharmacy. I resent Rx and nothing further is needed.

## 2017-03-24 ENCOUNTER — Ambulatory Visit: Payer: Self-pay

## 2017-03-27 ENCOUNTER — Telehealth: Payer: Self-pay | Admitting: Cardiology

## 2017-03-27 ENCOUNTER — Ambulatory Visit (INDEPENDENT_AMBULATORY_CARE_PROVIDER_SITE_OTHER): Payer: PPO | Admitting: Cardiology

## 2017-03-27 ENCOUNTER — Other Ambulatory Visit: Payer: Self-pay

## 2017-03-27 ENCOUNTER — Encounter: Payer: Self-pay | Admitting: Cardiology

## 2017-03-27 VITALS — BP 92/58 | HR 83 | Ht 61.0 in | Wt 120.8 lb

## 2017-03-27 DIAGNOSIS — I2510 Atherosclerotic heart disease of native coronary artery without angina pectoris: Secondary | ICD-10-CM

## 2017-03-27 DIAGNOSIS — I5032 Chronic diastolic (congestive) heart failure: Secondary | ICD-10-CM | POA: Diagnosis not present

## 2017-03-27 DIAGNOSIS — J449 Chronic obstructive pulmonary disease, unspecified: Secondary | ICD-10-CM

## 2017-03-27 DIAGNOSIS — R600 Localized edema: Secondary | ICD-10-CM | POA: Diagnosis not present

## 2017-03-27 DIAGNOSIS — I959 Hypotension, unspecified: Secondary | ICD-10-CM | POA: Diagnosis not present

## 2017-03-27 NOTE — Telephone Encounter (Signed)
Mr.Hailu is calling because [name] feet is swollen and she is on Prednisone and has been for a few days , but it has not been doing anything for her feet and her Blood pressure is below 90. Please call .Marland Kitchen Thanks

## 2017-03-27 NOTE — Telephone Encounter (Signed)
Order placed for ONO on 2 liters oxygen.   LMOM at pt's home that someone will be calling regarding the ONO set up. Nothing further needed

## 2017-03-27 NOTE — Telephone Encounter (Signed)
Spoke with Boneta Lucks at [facility] and she states that in order for pt to change over to BIPAP with a COPD DX they must have 3 things: 1) ABG with qualifying results - THEY HAVE THIS 2) ONO on current liter flow and must desat for an accumalative of 5 minutes for less than 88% - THEY STILL NEEDS THIS 3) A addenement to an office note from Dr Delton Coombes that states that OSA and CPAP have been ruled out. - THEY STILL NEED THIS  Please advise if ok to order ONO and addend last office note.

## 2017-03-27 NOTE — Telephone Encounter (Signed)
I definitely believe that she would benefit from a home health assessment and probably RN visits. OK with me to order through whichever DME can accommodate her

## 2017-03-27 NOTE — Patient Outreach (Signed)
[facility] Care Management  03/27/2017  [name] 06/07/1935 [id number]  Subjective: [name] reports client with low blood pressure this weekend. He states client's lasix was increased by pulmonologist to attempt to help get swelling in her feet down. Mr. Ballowe states the "swelling in her feet has not come down any since she come home from the hospial.  Objective: none  Assessment: 82 year old female with recent admission 8/28-9/5 for COPD exacerbation. History of emphysema, COPD, heart failure, CAD, HTN, hyperlipidemia, oxygen at 2liters/nasal cannula  RNCM returned call to Mr. Helgeson who states he called because clients blood pressure has been running low.   Mr. Lana states, he spoke with DrLindaann Slough office (cardiology) this morning. Client has an appointment at 3:30 today.   Mr. Feagan states that [facility] was told from [facility] that client would need a sleep study to qualify for insurance to cover BiPap. He reports he is not able to pay private pay for Bipap machine.   Per Mr. Corliss, Blood pressure today: 7am-91/59 pulse 78; at 08:30am 87/60 pulse 70. Medications taken today: Symbicort at 3:15 am, morphine 0.14 ml every two hours since 3:30 am and lasix at 6am; prednisone at 6:30 am, Bystolic at 6am nebulizer at 7am.  Mr. Faso informed that RNCM spoke with Dr. Kavin Leech office and requested a referral for home health nurse, and client should be receiveing a phone call regarding start of care.   Plan: home visit scheduled for tomorrow, follow up regaridng Bipap needs.  Kathyrn Sheriff, RN, MSN, [facility] Community Care Coordinator Cell: [phone]

## 2017-03-27 NOTE — Patient Instructions (Addendum)
Medication Instructions: Your physician has recommended you make the following change in your medication:  - 1) ON [facility] 03/29/17 ONLY Take 2 tablets (80 mg) of Furosemide (lasix) in the AM and 1 tablet (40 mg) in the PM  - 2) STOP LOSARTAN  Labwork: None Ordered  Procedures/Testing: None Ordered  Follow-Up: Your physician recommends that you schedule a follow-up appointment in: October with Nada Boozer, NP  Your physician recommends that you schedule a follow-up appointment first available with Dr. Delton See  Any Additional Special Instructions Will Be Listed Below (If Applicable).  -- Call Triage on Thursday 03/30/17 to report if your swelling has gotten any better -- Please try to reduce or limit your sodium (salt) intake -- When you are sitting in your chair at home please try to elevate your feet as much as possible -- Purchase Support Stockings for your legs to help reduce lower extremity swelling  If you need a refill on your cardiac medications before your next appointment, please call your pharmacy. \

## 2017-03-27 NOTE — Telephone Encounter (Signed)
Spoke with [place] at [facility].  Advised that DR Delton Coombes if ok with Nursing home health assessment and visits.  Order placed.  Donn Pierini verbalized understanding.  She also states that pt was called by [facility] and told that she would need another sleep study in order to get changed over to BIPAP.  I attempted to call Barbara Cower at [facility] but was unable to talk with him.  Will call back.

## 2017-03-27 NOTE — Progress Notes (Signed)
Cardiology Office Note   Date:  03/27/2017   ID:  [name], DOB 16-Aug-1934, MRN [id number]  PCP:  Velna Hatchet, MD  Cardiologist:  Dr. Meda Coffee    Chief Complaint  Patient presents with  . Hospitalization Follow-up      History of Present Illness: [name] is a 82 y.o. female who presents for post hospitalization.     She has a history of HTN, COPD, ?hypertrophic cardiomyopathy 2013, mild carotid artery disease, coronary calcification on CT 2013 (neg dobutamine nuc 2014, neg Lexiscan nuc 02/2014), chronic diastolic CHF, hyperlipidemia who presents for f/u of shortness of breath. She saw Dr. Verl Blalock 8/12 after undergoing a "Life Screening Survey" which reportedly demonstrated mild carotid artery stenosis bilaterally (no percentage given) and mildly reduced ABIs bilaterally (0.9). She has since gone on to have normal ABIs in 2015. She's had a question of infrarenal abdominal aorta dilatation on CT in the past but subsequent ultrasounds have been negative for aneurysm. She's not had any recent carotid duplex. Her last echo 09/2015 showed normal EF, mild focal basal hypertrophy of the septum, EF 65-70%, grade 1 DD (however, note: prior echo 2013 showed EF 70-75% with dynamic obstruction and mid-cavity obliteration). She is also followed by pulmonology. In 2017 she was hospitalized for a/c COPD exacerbation with PNA, hypoxia and hypercarbia. She was also treated for potential HF as well  Was discharged 03/15/17 after admit for increased SOB with COPD exacerbation.  .  Also with tachycardia.  Home oxygen 24/7.  She has been seen back by Dr. Lamonte Sakai and with lower ext edema he increased her lasix to 40 mg BID her Cr then was stable.     Today her BP is in the 04H systolic and was 82 the other day.   She has no lightheadedness or dizziness.  SOB is without change.  No chest pain and HR today is in 80s improved from the hospital. She does have lower ext edema of feet and ankles.  This does not  resolve at night.  She still adds some salt to her food and her feet are minimally elevated when she is sitting up during the day.  We discussed this and she will decrease salt and elevate legs as well.  Also to wear support stockings during the day.     She is now on morphine to help with her breathing, in hospital she was made a partial code and palliative care is assisting her as well.  She does feel better now than she did in the hospital.  Her appetite is slowly improving as well.  Her wt has decreased .  Reviewed labs from pulmonary office see below.  Echo in hospital with EF 55-60%, no RWMA, G1DD.     Past Medical History:  Diagnosis Date  . Actinomycosis, cervicofacial   . CAD (coronary artery disease)    a. coronary calcification on prior Chest CT;  b. Dob MV 1/14:  EF 85%, no ischemia  . COPD (chronic obstructive pulmonary disease) ([place])   . Emphysema   . HTN (hypertension)   . Hx of echocardiogram    Echocardiogram 04/16/12: Mild focal basal septal hypertrophy, EF 70-75% with dynamic obstruction with mid cavity obliteration, grade 1 diastolic dysfunction, MAC, atrial septal thickening with findings consistent with lipomatous hypertrophy  . Hyperlipidemia   . PVD (peripheral vascular disease) ([place])     Past Surgical History:  Procedure Laterality Date  . APPENDECTOMY    . BONE GRAFT HIP  ILIAC CREST    . TONSILLECTOMY       Current Outpatient Prescriptions  Medication Sig Dispense Refill  . albuterol (PROVENTIL HFA;VENTOLIN HFA) 108 (90 BASE) MCG/ACT inhaler Inhale 2 puffs into the lungs every 6 (six) hours as needed. For shortness of breath    . aspirin 81 MG tablet Take 81 mg by mouth daily.      Marland Kitchen aspirin-acetaminophen-caffeine (EXCEDRIN MIGRAINE) 250-250-65 MG tablet Take 1 tablet by mouth every 6 (six) hours as needed for headache.    Marland Kitchen atorvastatin (LIPITOR) 20 MG tablet Take 1 tablet (20 mg total) by mouth daily. 90 tablet 3  . budesonide-formoterol (SYMBICORT) 160-4.5  MCG/ACT inhaler Inhale 2 puffs into the lungs 2 (two) times daily. 2 Inhaler 0  . BYSTOLIC 5 MG tablet TAKE 1 TABLET (5 MG TOTAL) BY MOUTH DAILY. 90 tablet 1  . Calcium Carb-Cholecalciferol (CALCIUM 600 + D PO) Take 1 tablet by mouth daily.    . clonazePAM (KLONOPIN) 0.5 MG tablet Take 0.5 tablets (0.25 mg total) by mouth 2 (two) times daily. 60 tablet 0  . furosemide (LASIX) 40 MG tablet Take 1 tablet (40 mg total) by mouth daily. 30 tablet 0  . guaiFENesin (MUCINEX) 600 MG 12 hr tablet Take 2 tablets (1,200 mg total) by mouth 2 (two) times daily. 30 tablet 0  . ipratropium (ATROVENT) 0.02 % nebulizer solution Take 2.5 mLs (0.5 mg total) by nebulization 4 (four) times daily. DX: J44.9 75 mL 12  . Magnesium 500 MG CAPS Take 1 capsule by mouth daily.    . Morphine Sulfate (MORPHINE CONCENTRATE) 10 mg / 0.5 ml concentrated solution Place 0.13-0.25 mLs (2.6-5 mg total) under the tongue every 2 (two) hours as needed for shortness of breath. 240 mL 0  . Multiple Vitamin (MULTIVITAMIN) tablet Take 1 tablet by mouth daily.      . ondansetron (ZOFRAN) 4 MG tablet Take 1 tablet (4 mg total) by mouth every 6 (six) hours as needed for nausea. 20 tablet 0  . pantoprazole (PROTONIX) 40 MG tablet Take 1 tablet (40 mg total) by mouth daily. 30 tablet 0  . polyethylene glycol (MIRALAX / GLYCOLAX) packet Take 17 g by mouth 2 (two) times daily. 14 each 0  . predniSONE (DELTASONE) 10 MG tablet Take 2 tablets (20 mg total) by mouth daily. 30 tablet 0  . predniSONE (STERAPRED UNI-PAK 21 TAB) 10 MG (21) TBPK tablet Take 6 pills Day 1, 5 Pills Day 2, 4 Pills Day 3, 3 Pills Day 4, 2 Pills Day 5, 1 Pill Day 6, and Stop on Day 7 21 tablet 0  . senna-docusate (SENOKOT-S) 8.6-50 MG tablet Take 2 tablets by mouth 2 (two) times daily. 30 tablet 0  . SPIRIVA RESPIMAT 2.5 MCG/ACT AERS INHALE 2 PUFFS INTO THE LUNGS DAILY. 12 g 3  . traZODone (DESYREL) 50 MG tablet Take 0.5 tablets (25 mg total) by mouth at bedtime as needed for  sleep. 30 tablet 0  . UNABLE TO FIND Inhale 1.5-3 L into the lungs continuous.      No current facility-administered medications for this visit.     Allergies:   Levaquin [levofloxacin in d5w]; Penicillins; Pneumococcal vaccine; and Pneumovax [pneumococcal polysaccharide vaccine]    Social History:  The patient  reports that she quit smoking about 11 years ago. Her smoking use included Cigarettes. She has a 49.00 pack-year smoking history. She has never used smokeless tobacco. She reports that she does not drink alcohol or use drugs.  Family History:  The patient's family history includes Coronary artery disease in her brother and unknown relative; Emphysema in her father, sister, sister, and sister; Heart disease in her father and mother; Stomach cancer in her father and sister.    ROS:  General:no colds or fevers, + weight loss with decrease of appetite though slowly improving Skin:no rashes or ulcers HEENT:no blurred vision, no congestion CV:see HPI PUL:see HPI GI:no diarrhea constipation or melena, no indigestion GU:no hematuria, no dysuria MS:no joint pain, no claudication Neuro:no syncope, no lightheadedness Endo:no diabetes, no thyroid disease  Wt Readings from Last 3 Encounters:  03/27/17 120 lb 12.8 oz (54.8 kg)  03/21/17 121 lb (54.9 kg)  03/14/17 128 lb 4.9 oz (58.2 kg)     PHYSICAL EXAM: VS:  BP (!) 92/58   Pulse 83   Ht _0  (1.549 m)   Wt 120 lb 12.8 oz (54.8 kg)   LMP  (LMP Unknown)   BMI 22.82 kg/m  , BMI Body mass index is 22.82 kg/m. General:Pleasant affect, NAD Skin:Warm and dry, brisk capillary refill HEENT:normocephalic, sclera clear, mucus membranes moist Neck:supple, no JVD, no bruits  Heart:S1S2 RRR without murmur, gallup, rub or click Lungs:clear to diminished without rales, rhonchi, or wheezes, oxygen is in place JOA:CZYS, non tender, + BS, do not palpate liver spleen or masses Ext:no lower ext edema, 2+ pedal pulses, 2+ radial  pulses Neuro:alert and oriented, MAE, follows commands, + facial symmetry    EKG:  EKG is NOT ordered today.    Recent Labs: 03/07/2017: B Natriuretic Peptide 61.5 03/14/2017: ALT 20; BUN 53; Creatinine, Ser 1.19; Magnesium 2.6; Potassium 4.5; Sodium 136 03/21/2017: Hemoglobin 11.9; Platelets 295.0; Pro B Natriuretic peptide (BNP) 94.0    Lipid Panel    Component Value Date/Time   CHOL 213 12/16/2008   TRIG 166 12/16/2008   HDL 62 12/16/2008   LDLCALC 116 12/16/2008       Other studies Reviewed: Additional studies/ records that were reviewed today include:  ECHO 03/09/17. Study Conclusions  - Left ventricle: The cavity size was normal. There was mild focal   basal hypertrophy of the septum. Systolic function was normal.   The estimated ejection fraction was in the range of 55% to 60%.   Wall motion was normal; there were no regional wall motion   abnormalities. Doppler parameters are consistent with abnormal   left ventricular relaxation (grade 1 diastolic dysfunction).   Doppler parameters are consistent with indeterminate ventricular   filling pressure. - Aortic valve: Transvalvular velocity was within the normal range.   There was no stenosis. There was no regurgitation. - Mitral valve: Moderately calcified annulus. Transvalvular   velocity was within the normal range. There was no evidence for   stenosis. There was mild regurgitation. - Right ventricle: The cavity size was normal. Wall thickness was   normal. Systolic function was normal. - Atrial septum: No defect or patent foramen ovale was identified. - Tricuspid valve: There was no regurgitation.  ASSESSMENT AND PLAN:  1.  Hypotension will stop losartan, her EF is normal, if BP remains low would decrease bystolic but would prefer not to stop with tachycardia in hospital.  She is to see pulmonary next week.  2.  Lower ext edema.  Decrease salt, elevate legs and wear support stockings. On Wed this week will have  her take 80 of lasix in am and her usual 40 in pm.  She will call Thursday and let us know if that helped.  Serum  Cr is 1.19 on last check.  I will see her back in early 67 for follow up and Dr. Meda Coffee to see in Dec.       3.  Diastolic HF stable except for lower ext edema.  4.  CAD by coronary ca+ seen on CT, myoview done low risk. No chest pain.  5.  COPD followed by Dr. Lamonte Sakai      Current medicines are reviewed with the patient today.  The patient Has no concerns regarding medicines.  The following changes have been made:  See above Labs/ tests ordered today include:see above  Disposition:   FU:  see above  Signed, Cecilie Kicks, NP  03/27/2017 5:09 PM    [address] [place], [place], [place] Phone: [phone]; Fax: [phone]

## 2017-03-27 NOTE — Telephone Encounter (Signed)
Thanks for clarifying - go ahead and order ONO on 2L/min. Once I have this info I will addend her progress note with all the info they need.

## 2017-03-27 NOTE — Telephone Encounter (Signed)
Patient is due for a hospital follow-up. Patient complaining of SOB and low BP at 87/60 with some dizziness at times. Patient denies any chest pain. Made an appointment today with Nada Boozer NP.

## 2017-03-27 NOTE — Patient Outreach (Signed)
[facility]) Care Management  03/27/2017  [name] Feb 01, 1935 [id number]   Care Coordination: RNCM received call from Dr. Kavin Leech nurse, Angelique Blonder.  RNCM discussed home health nurse request; also informed that client does not have BiPAP and Mr. Tobey concern about affordability. Dr. Kavin Leech nurse to follow up.  Plan: continue to follow. Home visit scheduled for tomorrow.  Kathyrn Sheriff, RN, MSN, [facility] Community Care Coordinator Cell: [phone]

## 2017-03-28 ENCOUNTER — Telehealth: Payer: Self-pay | Admitting: Emergency Medicine

## 2017-03-28 ENCOUNTER — Other Ambulatory Visit: Payer: Self-pay

## 2017-03-28 NOTE — Telephone Encounter (Signed)
I tried to call Burna Mortimer but I had to leave a message with someone to call back. I wanted to clarify what she needed.

## 2017-03-28 NOTE — Patient Outreach (Addendum)
[facility]) Care Management   03/28/2017  [name] Jun 13, 1935 [id number]  [name] is an 82 y.o. female  Subjective: client reports she is having a good day. "I am in the green zone".  Objective:  BP 110/70   Pulse 81   Resp (!) 24   Ht 1.554 m (5' 1.2")   Wt 120 lb 12.8 oz (54.8 kg)   LMP  (LMP Unknown)   SpO2 94%   BMI 22.68 kg/m   Review of Systems  Respiratory: Negative for cough and wheezing.        Lungs sound decreased, no wheezing noted. Client noted to perform pursed lip breathing at times  Cardiovascular:       Heart rate regular.    Physical Exam  Encounter Medications:   Outpatient Encounter Prescriptions as of 03/28/2017  Medication Sig Note  . albuterol (PROVENTIL HFA;VENTOLIN HFA) 108 (90 BASE) MCG/ACT inhaler Inhale 2 puffs into the lungs every 6 (six) hours as needed. For shortness of breath   . aspirin 81 MG tablet Take 81 mg by mouth daily.     Marland Kitchen aspirin-acetaminophen-caffeine (EXCEDRIN MIGRAINE) 250-250-65 MG tablet Take 1 tablet by mouth every 6 (six) hours as needed for headache.   Marland Kitchen atorvastatin (LIPITOR) 20 MG tablet Take 1 tablet (20 mg total) by mouth daily.   . budesonide-formoterol (SYMBICORT) 160-4.5 MCG/ACT inhaler Inhale 2 puffs into the lungs 2 (two) times daily.   Marland Kitchen BYSTOLIC 5 MG tablet TAKE 1 TABLET (5 MG TOTAL) BY MOUTH DAILY.   . furosemide (LASIX) 40 MG tablet Take 1 tablet (40 mg total) by mouth daily. 03/28/2017: Taking 40 mg twice a day.  . ipratropium (ATROVENT) 0.02 % nebulizer solution Take 2.5 mLs (0.5 mg total) by nebulization 4 (four) times daily. DX: J44.9   . Morphine Sulfate (MORPHINE CONCENTRATE) 10 mg / 0.5 ml concentrated solution Place 0.13-0.25 mLs (2.6-5 mg total) under the tongue every 2 (two) hours as needed for shortness of breath.   . pantoprazole (PROTONIX) 40 MG tablet Take 1 tablet (40 mg total) by mouth daily.   . predniSONE (DELTASONE) 10 MG tablet Take 2 tablets (20 mg total) by  mouth daily.   . traZODone (DESYREL) 50 MG tablet Take 0.5 tablets (25 mg total) by mouth at bedtime as needed for sleep.   Marland Kitchen UNABLE TO FIND Inhale 1.5-3 L into the lungs continuous.    . Calcium Carb-Cholecalciferol (CALCIUM 600 + D PO) Take 1 tablet by mouth daily.   . clonazePAM (KLONOPIN) 0.5 MG tablet Take 0.5 tablets (0.25 mg total) by mouth 2 (two) times daily. (Patient not taking: Reported on 03/28/2017)   . guaiFENesin (MUCINEX) 600 MG 12 hr tablet Take 2 tablets (1,200 mg total) by mouth 2 (two) times daily. (Patient not taking: Reported on 03/28/2017)   . Magnesium 500 MG CAPS Take 1 capsule by mouth daily.   . Multiple Vitamin (MULTIVITAMIN) tablet Take 1 tablet by mouth daily.     . ondansetron (ZOFRAN) 4 MG tablet Take 1 tablet (4 mg total) by mouth every 6 (six) hours as needed for nausea. (Patient not taking: Reported on 03/28/2017)   . polyethylene glycol (MIRALAX / GLYCOLAX) packet Take 17 g by mouth 2 (two) times daily. (Patient not taking: Reported on 03/28/2017)   . predniSONE (STERAPRED UNI-PAK 21 TAB) 10 MG (21) TBPK tablet Take 6 pills Day 1, 5 Pills Day 2, 4 Pills Day 3, 3 Pills Day 4, 2 Pills Day  5, 1 Pill Day 6, and Stop on Day 7 (Patient not taking: Reported on 03/28/2017)   . senna-docusate (SENOKOT-S) 8.6-50 MG tablet Take 2 tablets by mouth 2 (two) times daily. (Patient not taking: Reported on 03/28/2017)   . SPIRIVA RESPIMAT 2.5 MCG/ACT AERS INHALE 2 PUFFS INTO THE LUNGS DAILY. (Patient not taking: Reported on 03/28/2017)    No facility-administered encounter medications on file as of 03/28/2017.     Functional Status:   In your present state of health, do you have any difficulty performing the following activities: 03/28/2017 03/07/2017  Hearing? N N  Vision? Y N  Difficulty concentrating or making decisions? Y N  Walking or climbing stairs? Y Y  Dressing or bathing? Y N  Comment "it tires me out". -  Doing errands, shopping? [name]  Preparing Food and eating ? N -   Using the Toilet? N -  In the past six months, have you accidently leaked urine? Y -  Comment "when coughing -  Do you have problems with loss of bowel control? N -  Managing your Medications? N -  Managing your Finances? N -  Housekeeping or managing your Housekeeping? N -  Some recent data might be hidden    Fall/Depression Screening:    Fall Risk  03/28/2017 02/01/2017 01/02/2017  Falls in the past year? No No No  Risk for fall due to : - - -  Risk for fall due to: Comment - - -   PHQ 2/9 Scores 03/16/2017 03/16/2017 02/27/2017 02/01/2017 01/02/2017 12/02/2016 09/20/2016  PHQ - 2 Score 0 1 0 0 0 0 0  PHQ- 9 Score - - - - - - -    Assessment:  82 year old female with recent admission 8/28-9/5 for COPD exacerbation. History of emphysema, COPD, heart failure, CAD, HTN, hyperlipidemia, oxygen at 2liters/nasal cannula  Initial home visit. Client was sitting in chair. She reports she is having a good day.   Re: lower extremity edema: per patient and her husband. Lower extremity edema decreased. TED hose on. Client reports edema in lower extremeties has decreased since putting on TED hose yesterday. She is also elevating legs.  Medications reviewed. Client reports no questions or concerns.  Re: COPD- O2 on at 2l/[place]. Client sitting up in chair, SOB noted with minimal exertion. Client reports she is taking medications as ordered. She states the initiation of morphine has helped with her breathing.  RNCM encouraged Mr. Klas to particpate in some outside activities to decrease the possibility of caregiver burnout. He states their children will stay with client when needed.  Client/family to call RNCM as needed. RNCM also reinforced 24 hour nurse advice line.  Plan: transition of care call next week. [facility] CM Care Plan Problem One     Most Recent Value  Care Plan Problem One  at risk for readmission -recent admission for COPD  Role Documenting the Problem One  Care Management Coordinator  Care  Plan for Problem One  Active  [facility] Long Term Goal   client will not be readmitted within the next 31-45 days.  [facility] Long Term Goal Start Date  03/20/17  Interventions for Problem One Long Term Goal  reiterated COPD zone tool, reinforced benefits of home health agency starting care, encouraged continued follow up with providers as scheduled. provided positive feedback to both client and Mr. Artiga regarding management of client's illness.  [facility] CM Short Term Goal #1   client will attend follow up provider visits as  scheduled within the next 30 days.  [facility] CM Short Term Goal #1 Start Date  03/20/17  Interventions for Short Term Goal #1  discussed recent appointments, encoraged continued follow up as scheduled and as needed.   [facility] CM Short Term Goal #2   client will verbalize contact with care connections program in the next 30 days.  [facility] CM Short Term Goal #2 Start Date  03/22/17  Interventions for Short Term Goal #2  RNCM discussed care connection program and benefits     Kathyrn Sheriff, RN, MSN, [facility] Community Care Coordinator Cell: [phone]

## 2017-03-29 ENCOUNTER — Telehealth: Payer: Self-pay | Admitting: Emergency Medicine

## 2017-03-29 ENCOUNTER — Other Ambulatory Visit: Payer: Self-pay | Admitting: *Deleted

## 2017-03-29 DIAGNOSIS — I5041 Acute combined systolic (congestive) and diastolic (congestive) heart failure: Secondary | ICD-10-CM | POA: Diagnosis not present

## 2017-03-29 DIAGNOSIS — Z87891 Personal history of nicotine dependence: Secondary | ICD-10-CM | POA: Diagnosis not present

## 2017-03-29 DIAGNOSIS — Z9981 Dependence on supplemental oxygen: Secondary | ICD-10-CM | POA: Diagnosis not present

## 2017-03-29 DIAGNOSIS — J9600 Acute respiratory failure, unspecified whether with hypoxia or hypercapnia: Secondary | ICD-10-CM | POA: Diagnosis not present

## 2017-03-29 DIAGNOSIS — Z79891 Long term (current) use of opiate analgesic: Secondary | ICD-10-CM | POA: Diagnosis not present

## 2017-03-29 DIAGNOSIS — I110 Hypertensive heart disease with heart failure: Secondary | ICD-10-CM | POA: Diagnosis not present

## 2017-03-29 DIAGNOSIS — I2510 Atherosclerotic heart disease of native coronary artery without angina pectoris: Secondary | ICD-10-CM | POA: Diagnosis not present

## 2017-03-29 DIAGNOSIS — Z993 Dependence on wheelchair: Secondary | ICD-10-CM | POA: Diagnosis not present

## 2017-03-29 DIAGNOSIS — F419 Anxiety disorder, unspecified: Secondary | ICD-10-CM | POA: Diagnosis not present

## 2017-03-29 DIAGNOSIS — J449 Chronic obstructive pulmonary disease, unspecified: Secondary | ICD-10-CM

## 2017-03-29 DIAGNOSIS — Z7952 Long term (current) use of systemic steroids: Secondary | ICD-10-CM | POA: Diagnosis not present

## 2017-03-29 DIAGNOSIS — I739 Peripheral vascular disease, unspecified: Secondary | ICD-10-CM | POA: Diagnosis not present

## 2017-03-29 MED ORDER — IPRATROPIUM BROMIDE 0.02 % IN SOLN: 0.5000 mg | Freq: Four times a day (QID) | RESPIRATORY_TRACT | 12 refills | Status: DC

## 2017-03-29 NOTE — Telephone Encounter (Signed)
Called and spoke with Burna Mortimer and she stated that what is needed is to have the start date as of today.  VO given for this and nothing further is needed.

## 2017-03-29 NOTE — Telephone Encounter (Signed)
Will send to RB as a FYI.

## 2017-03-30 ENCOUNTER — Telehealth: Payer: Self-pay | Admitting: Cardiology

## 2017-03-30 NOTE — Telephone Encounter (Signed)
Returned call to pt, per Nada Boozer, NP, to let her know that the bp and edema are much better. If she notices wt gain of 3 lbs in a day or 5 lbs in a week, to take the Lasix 80 in morning and 40 in the afternoon.  Otherwise, take Lasix 40 bid.  Pt verbalized understanding.

## 2017-03-30 NOTE — Telephone Encounter (Signed)
New message    Pt verbalized that she is calling to tell RN that her swelling and weight has went down  Big improvement for BP  131/76 HR 77, please call her

## 2017-03-30 NOTE — Telephone Encounter (Signed)
Much better, BP and edema improved.  Continue lasix 40 mg BID for now and if edema returns take 80 mg in AM of Lasix and 40 mg in PM every Wed or if wt up 3 lbs in a day or 5lbs in a week.  Thanks.

## 2017-04-04 ENCOUNTER — Other Ambulatory Visit (INDEPENDENT_AMBULATORY_CARE_PROVIDER_SITE_OTHER): Payer: PPO

## 2017-04-04 ENCOUNTER — Encounter: Payer: Self-pay | Admitting: Adult Health

## 2017-04-04 ENCOUNTER — Ambulatory Visit (INDEPENDENT_AMBULATORY_CARE_PROVIDER_SITE_OTHER): Payer: PPO | Admitting: Adult Health

## 2017-04-04 DIAGNOSIS — B370 Candidal stomatitis: Secondary | ICD-10-CM | POA: Diagnosis not present

## 2017-04-04 DIAGNOSIS — J9612 Chronic respiratory failure with hypercapnia: Secondary | ICD-10-CM

## 2017-04-04 DIAGNOSIS — J9611 Chronic respiratory failure with hypoxia: Secondary | ICD-10-CM

## 2017-04-04 DIAGNOSIS — J449 Chronic obstructive pulmonary disease, unspecified: Secondary | ICD-10-CM

## 2017-04-04 DIAGNOSIS — J9600 Acute respiratory failure, unspecified whether with hypoxia or hypercapnia: Secondary | ICD-10-CM | POA: Diagnosis not present

## 2017-04-04 DIAGNOSIS — I5033 Acute on chronic diastolic (congestive) heart failure: Secondary | ICD-10-CM | POA: Diagnosis not present

## 2017-04-04 LAB — BASIC METABOLIC PANEL
BUN: 29 mg/dL — ABNORMAL HIGH (ref 6–23)
CO2: 36 mEq/L — ABNORMAL HIGH (ref 19–32)
Calcium: 9.5 mg/dL (ref 8.4–10.5)
Chloride: 89 mEq/L — ABNORMAL LOW (ref 96–112)
Creatinine, Ser: 1.06 mg/dL (ref 0.40–1.20)
GFR: 53.03 mL/min — AB (ref >60.00)
GLUCOSE: 160 mg/dL — AB (ref 70–99)
POTASSIUM: 3.7 meq/L (ref 3.5–5.1)
SODIUM: 135 meq/L (ref 135–145)

## 2017-04-04 MED ORDER — CLOTRIMAZOLE 10 MG MT TROC: 10.0000 mg | Freq: Every day | OROMUCOSAL | 0 refills | Status: AC

## 2017-04-04 MED ORDER — PREDNISONE 10 MG PO TABS: 10.0000 mg | ORAL_TABLET | Freq: Every day | ORAL | 0 refills | Status: AC

## 2017-04-04 MED ORDER — IPRATROPIUM BROMIDE 0.02 % IN SOLN: 0.5000 mg | Freq: Four times a day (QID) | RESPIRATORY_TRACT | 12 refills | Status: AC

## 2017-04-04 NOTE — Assessment & Plan Note (Signed)
Inhaler oral care  mycelex x 1 week

## 2017-04-04 NOTE — Progress Notes (Signed)
_0  ID: [name], female    DOB: [birth year], 82 y.o.   MRN: [id number]  Chief Complaint  Patient presents with  . Follow-up    COPD     Referring provider: Velna Hatchet, MD  HPI: 82 yo female former smoker followed for COPD , nodular disease on CT chest and Diastolic CHF , Hypoxic and Hypercarbic RF   TEST  PFT 2012> FEV1 36%, ratio 32, FVC 70%, DLCO 50%. ABG February 2017 PCO2 78 CT chest 12/06/2016. Stable right upper lobe scarring, stable 6 mm nodular density right upper lobe 2 years. Nearly resolved 3 cm opacity in left lung base with minimal residual scarring noted 2-D echo 03/09/2017 EF 16-07%, grade 1 diastolic dysfunction.   04/04/2017 Follow up : COPD , Hypercarbic RF  Patient returns for a two-week follow-up. Patient was recently seen in the office for a post hospital follow-up for recent COPD exacerbation with acute on chronic hypercarbic and hypoxic respiratory failure. With diastolic dysfunction. Patient was having slow improvement, last visit. She was restarted on prednisone at 20 mg daily. Lasix was increased back to 40 mg twice daily, her baseline dose. Order for nocturnal BiPAP for chronic hypercarbia was ordered. This has not been approved by insurance despite having well-documented hypercarbia. They requiring a overnight oximetry test which is being done today.  Patient was changed from Spiriva to Atrovent nebulizers 4 times daily. Patient is on morphine 2-5 mg every 2 hours as needed. She says this does help her relax with her breathing. Since last visit. Patient is starting to feel some better, feels that she is getting back to her baseline. She continues to remain very weak. She gets short of breath with minimal activity. She says that she woke up this morning with increased shortness of breath but after taking morphine and Klonopin breathing has improved. She denies any hemoptysis, fever, abdominal pain, increased lower extremity edema. Ankle edema is  down and weight is down 3 pounds.. Remains on O2 at 2l/m . Does complain of some tongue and mouth soreness .       Allergies  Allergen Reactions  . Levaquin [Levofloxacin In D5w] Other (See Comments)    Can't sleep   . Penicillins Hives    Has taken it since.  . Pneumococcal Vaccine Swelling    Severe localized swelling in arm  . Pneumovax [Pneumococcal Polysaccharide Vaccine] Hives    Immunization History  Administered Date(s) Administered  . H1N1 06/09/2008  . Influenza Split 05/04/2012, 03/25/2013  . Influenza Whole 04/11/2011  . Influenza, High Dose Seasonal PF 03/21/2017  . Influenza,inj,Quad PF,6+ Mos 04/15/2014, 04/09/2015, 04/06/2016  . Influenza,trivalent, recombinat, inj, PF 04/02/2013  . Influenza-Unspecified 04/09/2014  . Pneumococcal Polysaccharide-23 05/03/2007, 07/11/2009  . Tdap 11/19/2012    Past Medical History:  Diagnosis Date  . Actinomycosis, cervicofacial   . CAD (coronary artery disease)    a. coronary calcification on prior Chest CT;  b. Dob MV 1/14:  EF 85%, no ischemia  . COPD (chronic obstructive pulmonary disease) ([place])   . Emphysema   . HTN (hypertension)   . Hx of echocardiogram    Echocardiogram 04/16/12: Mild focal basal septal hypertrophy, EF 70-75% with dynamic obstruction with mid cavity obliteration, grade 1 diastolic dysfunction, MAC, atrial septal thickening with findings consistent with lipomatous hypertrophy  . Hyperlipidemia   . PVD (peripheral vascular disease) (H[facility])     Tobacco History: History  Smoking Status  . Former Smoker  . Packs/day: 1.00  . Years: 49.00  .  Types: Cigarettes  . Quit date: 07/11/2005  Smokeless Tobacco  . Never Used    Comment: smoked for 48 years 1 ppd   Counseling given: Not Answered   Outpatient Encounter Prescriptions as of 04/04/2017  Medication Sig  . albuterol (PROVENTIL HFA;VENTOLIN HFA) 108 (90 BASE) MCG/ACT inhaler Inhale 2 puffs into the lungs every 6 (six) hours as needed. For  shortness of breath  . aspirin 81 MG tablet Take 81 mg by mouth daily.    Marland Kitchen aspirin-acetaminophen-caffeine (EXCEDRIN MIGRAINE) 250-250-65 MG tablet Take 1 tablet by mouth every 6 (six) hours as needed for headache.  Marland Kitchen atorvastatin (LIPITOR) 20 MG tablet Take 1 tablet (20 mg total) by mouth daily.  . budesonide-formoterol (SYMBICORT) 160-4.5 MCG/ACT inhaler Inhale 2 puffs into the lungs 2 (two) times daily.  Marland Kitchen BYSTOLIC 5 MG tablet TAKE 1 TABLET (5 MG TOTAL) BY MOUTH DAILY.  . Calcium Carb-Cholecalciferol (CALCIUM 600 + D PO) Take 1 tablet by mouth daily.  . clonazePAM (KLONOPIN) 0.5 MG tablet Take 0.5 tablets (0.25 mg total) by mouth 2 (two) times daily.  . furosemide (LASIX) 40 MG tablet Take 1 tablet (40 mg total) by mouth daily.  Marland Kitchen guaiFENesin (MUCINEX) 600 MG 12 hr tablet Take 2 tablets (1,200 mg total) by mouth 2 (two) times daily.  Marland Kitchen ipratropium (ATROVENT) 0.02 % nebulizer solution Take 2.5 mLs (0.5 mg total) by nebulization 4 (four) times daily. DX: J44.9  . Magnesium 500 MG CAPS Take 1 capsule by mouth daily.  . Morphine Sulfate (MORPHINE CONCENTRATE) 10 mg / 0.5 ml concentrated solution Place 0.13-0.25 mLs (2.6-5 mg total) under the tongue every 2 (two) hours as needed for shortness of breath.  . Multiple Vitamin (MULTIVITAMIN) tablet Take 1 tablet by mouth daily.    . ondansetron (ZOFRAN) 4 MG tablet Take 1 tablet (4 mg total) by mouth every 6 (six) hours as needed for nausea.  . pantoprazole (PROTONIX) 40 MG tablet Take 1 tablet (40 mg total) by mouth daily.  . polyethylene glycol (MIRALAX / GLYCOLAX) packet Take 17 g by mouth 2 (two) times daily.  . predniSONE (DELTASONE) 10 MG tablet Take 1 tablet (10 mg total) by mouth daily.  Marland Kitchen senna-docusate (SENOKOT-S) 8.6-50 MG tablet Take 2 tablets by mouth 2 (two) times daily.  Marland Kitchen SPIRIVA RESPIMAT 2.5 MCG/ACT AERS INHALE 2 PUFFS INTO THE LUNGS DAILY.  . traZODone (DESYREL) 50 MG tablet Take 0.5 tablets (25 mg total) by mouth at bedtime as needed  for sleep.  Marland Kitchen UNABLE TO FIND Inhale 1.5-3 L into the lungs continuous.   . [DISCONTINUED] ipratropium (ATROVENT) 0.02 % nebulizer solution Take 2.5 mLs (0.5 mg total) by nebulization 4 (four) times daily. DX: J44.9  . [DISCONTINUED] predniSONE (DELTASONE) 10 MG tablet Take 2 tablets (20 mg total) by mouth daily.  . [DISCONTINUED] predniSONE (STERAPRED UNI-PAK 21 TAB) 10 MG (21) TBPK tablet Take 6 pills Day 1, 5 Pills Day 2, 4 Pills Day 3, 3 Pills Day 4, 2 Pills Day 5, 1 Pill Day 6, and Stop on Day 7  . clotrimazole (MYCELEX) 10 MG troche Take 1 tablet (10 mg total) by mouth 5 (five) times daily.   No facility-administered encounter medications on file as of 04/04/2017.      Review of Systems  Constitutional:   No  weight loss, night sweats,  Fevers, chills,  +fatigue, or  lassitude.  HEENT:   No headaches,  Difficulty swallowing,  Tooth/dental problems, or  Sore throat,  No sneezing, itching, ear ache, nasal congestion, post nasal drip,   CV:  No chest pain,  Orthopnea, PND,   anasarca, dizziness, palpitations, syncope.   GI  No heartburn, indigestion, abdominal pain, nausea, vomiting, diarrhea, change in bowel habits, loss of appetite, bloody stools.   Resp:   No chest wall deformity  Skin: no rash or lesions.  GU: no dysuria, change in color of urine, no urgency or frequency.  No flank pain, no hematuria   MS:  No joint pain or swelling.  No decreased range of motion.  No back pain.    Physical Exam  BP (!) 96/54 (BP Location: Left Arm, Cuff Size: Normal)   Pulse 86   Ht _0  (1.626 m)   Wt 115 lb 6.4 oz (52.3 kg)   LMP  (LMP Unknown)   SpO2 95%   BMI 19.81 kg/m   GEN: A/Ox3; pleasant , NAD, frail on O2    HEENT:  [place]/AT,  EACs-clear, TMs-wnl, NOSE-clear, THROAT-clear, no lesions, no postnasal drip or exudate noted.  Tongue with mild redness , mild white patches   NECK:  Supple w/ fair ROM; no JVD; normal carotid impulses w/o bruits; no thyromegaly or  nodules palpated; no lymphadenopathy.    RESP  Decreased Clear  P & A; w/o, wheezes/ rales/ or rhonchi. no accessory muscle use, no dullness to percussion  CARD:  RRR, no m/r/g, tr  peripheral edema, pulses intact, no cyanosis or clubbing.  GI:   Soft & nt; nml bowel sounds; no organomegaly or masses detected.   Musco: Warm bil, no deformities or joint swelling noted.   Neuro: alert, no focal deficits noted.    Skin: Warm, no lesions or rashes    Lab Results:  Imaging: Dg Chest Port 1 View  Result Date: 03/11/2017 CLINICAL DATA:  Shortness of breath and COPD. EXAM: PORTABLE CHEST 1 VIEW COMPARISON:  One-view chest x-ray 03/09/2017. FINDINGS: Heart size is normal. Atherosclerotic changes are noted at the aortic arch. Lungs are hyperinflated. There is no edema or effusion. No focal airspace disease is present. The visualized soft tissues and bony thorax are unremarkable. IMPRESSION: 1. Stable hyperinflation. 2. No acute cardiopulmonary disease. 3. Aortic atherosclerosis. Electronically Signed   By: San Morelle M.D.   On: 03/11/2017 09:45   Dg Chest Port 1 View  Result Date: 03/09/2017 CLINICAL DATA:  Shortness of Breath EXAM: PORTABLE CHEST 1 VIEW COMPARISON:  03/07/2017 FINDINGS: There is hyperinflation of the lungs compatible with COPD. Heart and mediastinal contours are within normal limits. Scarring in the right upper lobe. Otherwise, no focal opacities or effusions. No acute bony abnormality. IMPRESSION: COPD.  No active disease. Electronically Signed   By: Rolm Baptise M.D.   On: 03/09/2017 10:30   Dg Chest Portable 1 View  Result Date: 03/07/2017 CLINICAL DATA:  Shortness of breath EXAM: PORTABLE CHEST 1 VIEW COMPARISON:  02/08/2017 FINDINGS: Hyperinflation. Right lower probable nipple shadow. No acute consolidation or effusion. Stable cardiomediastinal silhouette with atherosclerosis. Emphysematous changes. Scarring in the right upper lobe. No pneumothorax. IMPRESSION:  Hyperinflation with emphysematous changes and scarring in the right lung a PACs. No acute infiltrate or edema Electronically Signed   By: Donavan Foil M.D.   On: 03/07/2017 04:20   [place] Rt Upper Extrem Ltd Soft Tissue Non Vascular  Result Date: 03/13/2017 CLINICAL DATA:  Antecubital hematoma EXAM: ULTRASOUND right UPPER EXTREMITY LIMITED TECHNIQUE: Ultrasound examination of the upper extremity soft tissues was performed in the area of clinical concern.  COMPARISON:  None FINDINGS: In the region of erythema and swelling we demonstrate no mass lesion or cystic lesion. IMPRESSION: 1. No specific antecubital lesion is observed sonographically on the right. Electronically Signed   By: Van Clines M.D.   On: 03/13/2017 14:15   [place] Jack[place] Soft Tissue Non Vascular  Result Date: 03/13/2017 CLINICAL DATA:  Left antecubital soft tissue swelling EXAM: ULTRASOUND Left UPPER EXTREMITY LIMITED TECHNIQUE: Ultrasound examination of the upper extremity soft tissues was performed in the area of clinical concern. COMPARISON:  None FINDINGS: In the left antecubital region we demonstrate no abnormal masslike appearance or cystic lesion. IMPRESSION: 1. No sonographic findings of mass lesion or cystic lesion in the left antecubital region. Electronically Signed   By: Van Clines M.D.   On: 03/13/2017 14:16     Assessment & Plan:   COPD (chronic obstructive pulmonary disease) (HCC) Recent flare -now resolving   Plan  Patient Instructions  Continue on Atrovent Neb Three times a day to Four times a day   Continue on Symbicort 2 puffs Twice daily  , rinse after use.  Mycelex troches five times a day for 1 week  Decrease Prednisone 8m daily .  Continue on Oxygen 2./m  Will check BIPAP order status.  ONO is pending .  Labs today .  Follow up with Dr. BLamonte Sakai In 2 weeks as planned and As needed   Please contact office for sooner follow up if symptoms do not improve or worsen or seek emergency  care      Chronic respiratory failure with hypoxia and hypercapnia ([place] Cont on O2 .  BIPAP for chronic hypercarbia . ONO is pending for insurance requirements.   Plan  Patient Instructions  Continue on Atrovent Neb Three times a day to Four times a day   Continue on Symbicort 2 puffs Twice daily  , rinse after use.  Mycelex troches five times a day for 1 week  Decrease Prednisone 155mdaily .  Continue on Oxygen 2./m  Will check BIPAP order status.  ONO is pending .  Labs today .  Follow up with Dr. ByLamonte SakaiIn 2 weeks as planned and As needed   Please contact office for sooner follow up if symptoms do not improve or worsen or seek emergency care      Acute on chronic diastolic CHF (congestive heart failure), NYHA class 3 (HCC) Recent flare now improved with lasix at maintenance dose.  Check bmet .     Oral candidiasis Inhaler oral care  mycelex x 1 week      TaRexene EdisonNP 04/04/2017

## 2017-04-04 NOTE — Assessment & Plan Note (Signed)
Cont on O2 .  BIPAP for chronic hypercarbia . ONO is pending for insurance requirements.   Plan  Patient Instructions  Continue on Atrovent Neb Three times a day to Four times a day   Continue on Symbicort 2 puffs Twice daily  , rinse after use.  Mycelex troches five times a day for 1 week  Decrease Prednisone  daily .  Continue on Oxygen 2./m  Will check BIPAP order status.  ONO is pending .  Labs today .  Follow up with Dr. Delton Coombes  In 2 weeks as planned and As needed   Please contact office for sooner follow up if symptoms do not improve or worsen or seek emergency care

## 2017-04-04 NOTE — Assessment & Plan Note (Signed)
Recent flare now improved with lasix at maintenance dose.  Check bmet .

## 2017-04-04 NOTE — Patient Instructions (Signed)
Continue on Atrovent Neb Three times a day to Four times a day   Continue on Symbicort 2 puffs Twice daily  , rinse after use.  Mycelex troches five times a day for 1 week  Decrease Prednisone  daily .  Continue on Oxygen 2./m  Will check BIPAP order status.  ONO is pending .  Labs today .  Follow up with Dr. Delton Coombes  In 2 weeks as planned and As needed   Please contact office for sooner follow up if symptoms do not improve or worsen or seek emergency care

## 2017-04-04 NOTE — Assessment & Plan Note (Addendum)
Recent flare -now resolving   Plan  Patient Instructions  Continue on Atrovent Neb Three times a day to Four times a day   Continue on Symbicort 2 puffs Twice daily  , rinse after use.  Mycelex troches five times a day for 1 week  Decrease Prednisone  daily .  Continue on Oxygen 2./m  Will check BIPAP order status.  ONO is pending .  Labs today .  Follow up with Dr. Delton Coombes  In 2 weeks as planned and As needed   Please contact office for sooner follow up if symptoms do not improve or worsen or seek emergency care

## 2017-04-05 ENCOUNTER — Other Ambulatory Visit: Payer: Self-pay

## 2017-04-05 ENCOUNTER — Telehealth: Payer: Self-pay | Admitting: Cardiology

## 2017-04-05 NOTE — Telephone Encounter (Signed)
New Message    Spoke with [name] today and he is requesting a 2nd opinion from the [facility] about [name] Heart failure.

## 2017-04-05 NOTE — Telephone Encounter (Signed)
Patient wants a referral to HF clinic. Will forward to Dr. Delton See.

## 2017-04-05 NOTE — Patient Outreach (Signed)
[facility]) Care Management  04/05/2017  [name] Sep 01, 1934 [id number]   Subjective: Client reports feeling more shortness of breath today. Client reports yesterday was a better day.  Objective: none  Assessment: RNCM called for transition of care. Client reports feeling more short of breath today. She states she is taking medications as ordered. Home health nurse is scheduled to see client around 2 pm today. Client reports attending visit to the pulmonary clinic yesterday. [name] reports that she is scheduled for testing for BiPap machine, but they did not bring the equipment out. [name] expressed he knew someone that was treated at the heart failure clinic and is doing better now and would like a referral for an assessment/evaluation/second opinion about her heart failure with the heart failure clinic.   RNCM called [facility]-spoke with Judeth Cornfield who reports that they will be calling client today to send testing equipment out.   RNCM called cardiology clinic regarding client request for heart failure clinic referral for second opinion.    Plan: update client and follow up next week.  Kathyrn Sheriff, RN, MSN, [facility] Community Care Coordinator Cell: [phone]

## 2017-04-06 NOTE — Telephone Encounter (Signed)
Will send our [facility] schedulers a message to arrange a new pt appt in HF with Dr. Shirlee Latch or Dr. Gala Romney.  [facility] to follow-up with the pt to arrange this appt.  Will send a FYI message to [place] at [facility].

## 2017-04-06 NOTE — Telephone Encounter (Signed)
Please refer to Dr Gala Romney or Shirlee Latch, thank you

## 2017-04-10 NOTE — Telephone Encounter (Signed)
Pt scheduled to see Dr. Shirlee Latch in CHF Clinic on 10/18.  Pt made aware of appt date and time by [facility] scheduling.

## 2017-04-11 ENCOUNTER — Encounter (HOSPITAL_COMMUNITY): Payer: Self-pay | Admitting: Emergency Medicine

## 2017-04-11 ENCOUNTER — Emergency Department (HOSPITAL_COMMUNITY): Payer: PPO

## 2017-04-11 ENCOUNTER — Inpatient Hospital Stay (HOSPITAL_COMMUNITY)
Admission: EM | Admit: 2017-04-11 | Discharge: 2017-04-15 | DRG: 190 | Disposition: A | Discharge status: Hospice | Payer: PPO | Attending: Internal Medicine | Admitting: Internal Medicine

## 2017-04-11 DIAGNOSIS — I5043 Acute on chronic combined systolic (congestive) and diastolic (congestive) heart failure: Secondary | ICD-10-CM | POA: Diagnosis not present

## 2017-04-11 DIAGNOSIS — R0682 Tachypnea, not elsewhere classified: Secondary | ICD-10-CM | POA: Diagnosis not present

## 2017-04-11 DIAGNOSIS — R0603 Acute respiratory distress: Secondary | ICD-10-CM | POA: Diagnosis present

## 2017-04-11 DIAGNOSIS — D509 Iron deficiency anemia, unspecified: Secondary | ICD-10-CM | POA: Diagnosis present

## 2017-04-11 DIAGNOSIS — F40240 Claustrophobia: Secondary | ICD-10-CM | POA: Diagnosis present

## 2017-04-11 DIAGNOSIS — R000 Tachycardia, unspecified: Secondary | ICD-10-CM | POA: Diagnosis not present

## 2017-04-11 DIAGNOSIS — R739 Hyperglycemia, unspecified: Secondary | ICD-10-CM | POA: Diagnosis present

## 2017-04-11 DIAGNOSIS — J9622 Acute and chronic respiratory failure with hypercapnia: Secondary | ICD-10-CM | POA: Diagnosis not present

## 2017-04-11 DIAGNOSIS — J438 Other emphysema: Secondary | ICD-10-CM | POA: Diagnosis not present

## 2017-04-11 DIAGNOSIS — J80 Acute respiratory distress syndrome: Secondary | ICD-10-CM | POA: Diagnosis not present

## 2017-04-11 DIAGNOSIS — N183 Chronic kidney disease, stage 3 (moderate): Secondary | ICD-10-CM | POA: Diagnosis not present

## 2017-04-11 DIAGNOSIS — J9620 Acute and chronic respiratory failure, unspecified whether with hypoxia or hypercapnia: Secondary | ICD-10-CM | POA: Diagnosis not present

## 2017-04-11 DIAGNOSIS — J9621 Acute and chronic respiratory failure with hypoxia: Secondary | ICD-10-CM | POA: Diagnosis not present

## 2017-04-11 DIAGNOSIS — F411 Generalized anxiety disorder: Secondary | ICD-10-CM | POA: Diagnosis not present

## 2017-04-11 DIAGNOSIS — Z7189 Other specified counseling: Secondary | ICD-10-CM

## 2017-04-11 DIAGNOSIS — Z9981 Dependence on supplemental oxygen: Secondary | ICD-10-CM

## 2017-04-11 DIAGNOSIS — Z7952 Long term (current) use of systemic steroids: Secondary | ICD-10-CM

## 2017-04-11 DIAGNOSIS — D631 Anemia in chronic kidney disease: Secondary | ICD-10-CM

## 2017-04-11 DIAGNOSIS — I5033 Acute on chronic diastolic (congestive) heart failure: Secondary | ICD-10-CM | POA: Diagnosis present

## 2017-04-11 DIAGNOSIS — I110 Hypertensive heart disease with heart failure: Secondary | ICD-10-CM | POA: Diagnosis not present

## 2017-04-11 DIAGNOSIS — R0602 Shortness of breath: Secondary | ICD-10-CM | POA: Diagnosis present

## 2017-04-11 DIAGNOSIS — K5900 Constipation, unspecified: Secondary | ICD-10-CM | POA: Diagnosis not present

## 2017-04-11 DIAGNOSIS — R0609 Other forms of dyspnea: Secondary | ICD-10-CM | POA: Diagnosis not present

## 2017-04-11 DIAGNOSIS — I739 Peripheral vascular disease, unspecified: Secondary | ICD-10-CM | POA: Diagnosis present

## 2017-04-11 DIAGNOSIS — N189 Chronic kidney disease, unspecified: Secondary | ICD-10-CM

## 2017-04-11 DIAGNOSIS — J449 Chronic obstructive pulmonary disease, unspecified: Secondary | ICD-10-CM | POA: Diagnosis not present

## 2017-04-11 DIAGNOSIS — Z87891 Personal history of nicotine dependence: Secondary | ICD-10-CM

## 2017-04-11 DIAGNOSIS — L8990 Pressure ulcer of unspecified site, unspecified stage: Secondary | ICD-10-CM | POA: Diagnosis present

## 2017-04-11 DIAGNOSIS — Z7951 Long term (current) use of inhaled steroids: Secondary | ICD-10-CM

## 2017-04-11 DIAGNOSIS — I10 Essential (primary) hypertension: Secondary | ICD-10-CM | POA: Diagnosis present

## 2017-04-11 DIAGNOSIS — D638 Anemia in other chronic diseases classified elsewhere: Secondary | ICD-10-CM | POA: Diagnosis not present

## 2017-04-11 DIAGNOSIS — R0902 Hypoxemia: Secondary | ICD-10-CM | POA: Diagnosis present

## 2017-04-11 DIAGNOSIS — I2510 Atherosclerotic heart disease of native coronary artery without angina pectoris: Secondary | ICD-10-CM | POA: Diagnosis not present

## 2017-04-11 DIAGNOSIS — L89621 Pressure ulcer of left heel, stage 1: Secondary | ICD-10-CM | POA: Diagnosis not present

## 2017-04-11 DIAGNOSIS — I5032 Chronic diastolic (congestive) heart failure: Secondary | ICD-10-CM | POA: Diagnosis present

## 2017-04-11 DIAGNOSIS — I130 Hypertensive heart and chronic kidney disease with heart failure and stage 1 through stage 4 chronic kidney disease, or unspecified chronic kidney disease: Secondary | ICD-10-CM | POA: Diagnosis present

## 2017-04-11 DIAGNOSIS — Z66 Do not resuscitate: Secondary | ICD-10-CM | POA: Diagnosis present

## 2017-04-11 DIAGNOSIS — E785 Hyperlipidemia, unspecified: Secondary | ICD-10-CM | POA: Diagnosis not present

## 2017-04-11 DIAGNOSIS — G4733 Obstructive sleep apnea (adult) (pediatric): Secondary | ICD-10-CM | POA: Diagnosis present

## 2017-04-11 DIAGNOSIS — K219 Gastro-esophageal reflux disease without esophagitis: Secondary | ICD-10-CM | POA: Diagnosis not present

## 2017-04-11 DIAGNOSIS — I422 Other hypertrophic cardiomyopathy: Secondary | ICD-10-CM | POA: Diagnosis present

## 2017-04-11 DIAGNOSIS — T380X5A Adverse effect of glucocorticoids and synthetic analogues, initial encounter: Secondary | ICD-10-CM | POA: Diagnosis present

## 2017-04-11 DIAGNOSIS — Z881 Allergy status to other antibiotic agents status: Secondary | ICD-10-CM

## 2017-04-11 DIAGNOSIS — Z887 Allergy status to serum and vaccine status: Secondary | ICD-10-CM

## 2017-04-11 DIAGNOSIS — Z880 Allergy status to penicillin: Secondary | ICD-10-CM

## 2017-04-11 DIAGNOSIS — Z515 Encounter for palliative care: Secondary | ICD-10-CM | POA: Diagnosis not present

## 2017-04-11 DIAGNOSIS — J441 Chronic obstructive pulmonary disease with (acute) exacerbation: Secondary | ICD-10-CM | POA: Diagnosis not present

## 2017-04-11 DIAGNOSIS — R0989 Other specified symptoms and signs involving the circulatory and respiratory systems: Secondary | ICD-10-CM | POA: Diagnosis not present

## 2017-04-11 DIAGNOSIS — F419 Anxiety disorder, unspecified: Secondary | ICD-10-CM | POA: Diagnosis present

## 2017-04-11 DIAGNOSIS — Z79899 Other long term (current) drug therapy: Secondary | ICD-10-CM

## 2017-04-11 DIAGNOSIS — I779 Disorder of arteries and arterioles, unspecified: Secondary | ICD-10-CM | POA: Diagnosis present

## 2017-04-11 DIAGNOSIS — Z7982 Long term (current) use of aspirin: Secondary | ICD-10-CM

## 2017-04-11 LAB — URINALYSIS, ROUTINE W REFLEX MICROSCOPIC
BILIRUBIN URINE: NEGATIVE
Glucose, UA: NEGATIVE mg/dL
Hgb urine dipstick: NEGATIVE
Ketones, ur: NEGATIVE mg/dL
Leukocytes, UA: NEGATIVE
NITRITE: NEGATIVE
PH: 6 (ref 5.0–8.0)
Protein, ur: NEGATIVE mg/dL
SPECIFIC GRAVITY, URINE: 1.008 (ref 1.005–1.030)

## 2017-04-11 LAB — COMPREHENSIVE METABOLIC PANEL
ALT: 22 U/L (ref 14–54)
AST: 26 U/L (ref 15–41)
Albumin: 3.4 g/dL — ABNORMAL LOW (ref 3.5–5.0)
Alkaline Phosphatase: 59 U/L (ref 38–126)
Anion gap: 17 — ABNORMAL HIGH (ref 5–15)
BUN: 14 mg/dL (ref 6–20)
CHLORIDE: 87 mmol/L — AB (ref 101–111)
CO2: 30 mmol/L (ref 22–32)
CREATININE: 1.29 mg/dL — AB (ref 0.44–1.00)
Calcium: 9.4 mg/dL (ref 8.9–10.3)
GFR calc Af Amer: 44 mL/min — ABNORMAL LOW (ref >60)
GFR calc non Af Amer: 38 mL/min — ABNORMAL LOW (ref >60)
Glucose, Bld: 219 mg/dL — ABNORMAL HIGH (ref 65–99)
Potassium: 3.2 mmol/L — ABNORMAL LOW (ref 3.5–5.1)
Sodium: 134 mmol/L — ABNORMAL LOW (ref 135–145)
Total Bilirubin: 0.7 mg/dL (ref 0.3–1.2)
Total Protein: 7 g/dL (ref 6.5–8.1)

## 2017-04-11 LAB — CBC WITH DIFFERENTIAL/PLATELET
Basophils Absolute: 0 10*3/uL (ref 0.0–0.1)
Basophils Relative: 0 %
EOS ABS: 0.1 10*3/uL (ref 0.0–0.7)
EOS PCT: 0 %
HCT: 38 % (ref 36.0–46.0)
Hemoglobin: 12.3 g/dL (ref 12.0–15.0)
LYMPHS ABS: 3.2 10*3/uL (ref 0.7–4.0)
Lymphocytes Relative: 20 %
MCH: 30.1 pg (ref 26.0–34.0)
MCHC: 32.4 g/dL (ref 30.0–36.0)
MCV: 93.1 fL (ref 78.0–100.0)
MONOS PCT: 9 %
Monocytes Absolute: 1.4 10*3/uL — ABNORMAL HIGH (ref 0.1–1.0)
Neutro Abs: 11.7 10*3/uL — ABNORMAL HIGH (ref 1.7–7.7)
Neutrophils Relative %: 71 %
PLATELETS: 390 10*3/uL (ref 150–400)
RBC: 4.08 MIL/uL (ref 3.87–5.11)
RDW: 13.2 % (ref 11.5–15.5)
WBC: 16.4 10*3/uL — AB (ref 4.0–10.5)

## 2017-04-11 LAB — BRAIN NATRIURETIC PEPTIDE: B Natriuretic Peptide: 164.4 pg/mL — ABNORMAL HIGH (ref 0.0–100.0)

## 2017-04-11 LAB — TROPONIN I: TROPONIN I: 0.03 ng/mL — AB (ref <0.03)

## 2017-04-11 LAB — MRSA PCR SCREENING: MRSA BY PCR: NEGATIVE

## 2017-04-11 MED ORDER — ACETAMINOPHEN 325 MG PO TABS: 650.0000 mg | ORAL_TABLET | Freq: Four times a day (QID) | ORAL | Status: DC | PRN

## 2017-04-11 MED ORDER — DEXTROSE 5 % IV SOLN
500.0000 mg | Freq: Once | INTRAVENOUS | Status: AC
  Administered 2017-04-11: 500 mg via INTRAVENOUS
  Filled 2017-04-11: qty 500

## 2017-04-11 MED ORDER — TRAZODONE HCL 50 MG PO TABS
25.0000 mg | ORAL_TABLET | Freq: Every evening | ORAL | Status: DC | PRN
  Administered 2017-04-11: 25 mg via ORAL
  Filled 2017-04-11: qty 1

## 2017-04-11 MED ORDER — ALBUTEROL (5 MG/ML) CONTINUOUS INHALATION SOLN
10.0000 mg/h | INHALATION_SOLUTION | RESPIRATORY_TRACT | Status: DC
  Administered 2017-04-11: 10 mg/h via RESPIRATORY_TRACT
  Filled 2017-04-11 (×2): qty 20

## 2017-04-11 MED ORDER — ASPIRIN EC 81 MG PO TBEC
81.0000 mg | DELAYED_RELEASE_TABLET | Freq: Every day | ORAL | Status: DC
  Administered 2017-04-12 – 2017-04-14 (×3): 81 mg via ORAL
  Filled 2017-04-11 (×3): qty 1

## 2017-04-11 MED ORDER — LEVOFLOXACIN IN D5W 500 MG/100ML IV SOLN
500.0000 mg | INTRAVENOUS | Status: DC
  Filled 2017-04-11: qty 100

## 2017-04-11 MED ORDER — TIOTROPIUM BROMIDE MONOHYDRATE 18 MCG IN CAPS
18.0000 ug | ORAL_CAPSULE | Freq: Every day | RESPIRATORY_TRACT | Status: DC
  Administered 2017-04-12: 18 ug via RESPIRATORY_TRACT
  Filled 2017-04-11: qty 5

## 2017-04-11 MED ORDER — ASPIRIN-ACETAMINOPHEN-CAFFEINE 250-250-65 MG PO TABS
1.0000 | ORAL_TABLET | Freq: Four times a day (QID) | ORAL | Status: DC | PRN
  Filled 2017-04-11: qty 1

## 2017-04-11 MED ORDER — METHYLPREDNISOLONE SODIUM SUCC 125 MG IJ SOLR
60.0000 mg | Freq: Three times a day (TID) | INTRAMUSCULAR | Status: DC
  Administered 2017-04-11 – 2017-04-12 (×2): 60 mg via INTRAVENOUS
  Filled 2017-04-11 (×2): qty 2

## 2017-04-11 MED ORDER — ONDANSETRON HCL 4 MG PO TABS: 4.0000 mg | ORAL_TABLET | Freq: Four times a day (QID) | ORAL | Status: DC | PRN

## 2017-04-11 MED ORDER — BISACODYL 10 MG RE SUPP: 10.0000 mg | Freq: Every day | RECTAL | Status: DC | PRN

## 2017-04-11 MED ORDER — ENOXAPARIN SODIUM 40 MG/0.4ML ~~LOC~~ SOLN: 40.0000 mg | SUBCUTANEOUS | Status: DC

## 2017-04-11 MED ORDER — ONDANSETRON HCL 4 MG/2ML IJ SOLN: 4.0000 mg | Freq: Four times a day (QID) | INTRAMUSCULAR | Status: DC | PRN

## 2017-04-11 MED ORDER — ACETAMINOPHEN 650 MG RE SUPP: 650.0000 mg | Freq: Four times a day (QID) | RECTAL | Status: DC | PRN

## 2017-04-11 MED ORDER — IPRATROPIUM-ALBUTEROL 0.5-2.5 (3) MG/3ML IN SOLN
3.0000 mL | RESPIRATORY_TRACT | Status: DC
  Filled 2017-04-11: qty 3

## 2017-04-11 MED ORDER — FUROSEMIDE 20 MG PO TABS: 40.0000 mg | ORAL_TABLET | Freq: Every day | ORAL | Status: DC

## 2017-04-11 MED ORDER — SODIUM CHLORIDE 0.9 % IV SOLN
INTRAVENOUS | Status: DC
  Administered 2017-04-11: 10:00:00 via INTRAVENOUS

## 2017-04-11 MED ORDER — GUAIFENESIN ER 600 MG PO TB12
1200.0000 mg | ORAL_TABLET | Freq: Two times a day (BID) | ORAL | Status: DC
  Administered 2017-04-11 – 2017-04-15 (×8): 1200 mg via ORAL
  Filled 2017-04-11 (×8): qty 2

## 2017-04-11 MED ORDER — MORPHINE SULFATE (CONCENTRATE) 10 MG/0.5ML PO SOLN
2.5000 mg | ORAL | Status: DC | PRN
  Administered 2017-04-11 – 2017-04-12 (×5): 2.6 mg via SUBLINGUAL
  Filled 2017-04-11 (×6): qty 0.5

## 2017-04-11 MED ORDER — ALBUTEROL SULFATE (2.5 MG/3ML) 0.083% IN NEBU
5.0000 mg | INHALATION_SOLUTION | Freq: Once | RESPIRATORY_TRACT | Status: AC
  Administered 2017-04-11: 5 mg via RESPIRATORY_TRACT
  Filled 2017-04-11: qty 6

## 2017-04-11 MED ORDER — CLONAZEPAM 0.5 MG PO TABS
0.2500 mg | ORAL_TABLET | Freq: Two times a day (BID) | ORAL | Status: DC
  Administered 2017-04-11 – 2017-04-12 (×3): 0.25 mg via ORAL
  Filled 2017-04-11 (×3): qty 1

## 2017-04-11 MED ORDER — SENNOSIDES-DOCUSATE SODIUM 8.6-50 MG PO TABS: 1.0000 | ORAL_TABLET | Freq: Every evening | ORAL | Status: DC | PRN

## 2017-04-11 MED ORDER — FUROSEMIDE 40 MG PO TABS
40.0000 mg | ORAL_TABLET | Freq: Every day | ORAL | Status: DC
  Administered 2017-04-12 – 2017-04-13 (×2): 40 mg via ORAL
  Filled 2017-04-11 (×2): qty 1

## 2017-04-11 MED ORDER — NEBIVOLOL HCL 5 MG PO TABS
5.0000 mg | ORAL_TABLET | Freq: Every day | ORAL | Status: DC
  Administered 2017-04-12 – 2017-04-15 (×4): 5 mg via ORAL
  Filled 2017-04-11 (×4): qty 1

## 2017-04-11 MED ORDER — INSULIN ASPART 100 UNIT/ML ~~LOC~~ SOLN
0.0000 [IU] | Freq: Three times a day (TID) | SUBCUTANEOUS | Status: DC
  Administered 2017-04-12: 2 [IU] via SUBCUTANEOUS
  Administered 2017-04-12 – 2017-04-13 (×2): 1 [IU] via SUBCUTANEOUS
  Administered 2017-04-13: 2 [IU] via SUBCUTANEOUS
  Administered 2017-04-14 – 2017-04-15 (×3): 1 [IU] via SUBCUTANEOUS

## 2017-04-11 MED ORDER — ALBUTEROL SULFATE (2.5 MG/3ML) 0.083% IN NEBU
2.5000 mg | INHALATION_SOLUTION | RESPIRATORY_TRACT | Status: DC | PRN
  Filled 2017-04-11: qty 3

## 2017-04-11 MED ORDER — HYDROCODONE-ACETAMINOPHEN 5-325 MG PO TABS
1.0000 | ORAL_TABLET | ORAL | Status: DC | PRN
  Administered 2017-04-11: 1 via ORAL
  Filled 2017-04-11: qty 1

## 2017-04-11 MED ORDER — PANTOPRAZOLE SODIUM 40 MG PO TBEC
40.0000 mg | DELAYED_RELEASE_TABLET | Freq: Every day | ORAL | Status: DC
  Administered 2017-04-12 – 2017-04-15 (×4): 40 mg via ORAL
  Filled 2017-04-11 (×4): qty 1

## 2017-04-11 MED ORDER — ENOXAPARIN SODIUM 30 MG/0.3ML ~~LOC~~ SOLN
30.0000 mg | SUBCUTANEOUS | Status: DC
  Administered 2017-04-11 – 2017-04-13 (×3): 30 mg via SUBCUTANEOUS
  Filled 2017-04-11 (×3): qty 0.3

## 2017-04-11 MED ORDER — ATORVASTATIN CALCIUM 20 MG PO TABS
20.0000 mg | ORAL_TABLET | Freq: Every day | ORAL | Status: DC
  Administered 2017-04-11 – 2017-04-14 (×4): 20 mg via ORAL
  Filled 2017-04-11 (×4): qty 1

## 2017-04-11 MED ORDER — ALBUTEROL SULFATE HFA 108 (90 BASE) MCG/ACT IN AERS
2.0000 | INHALATION_SPRAY | Freq: Four times a day (QID) | RESPIRATORY_TRACT | Status: DC | PRN
  Administered 2017-04-12 (×2): 2 via RESPIRATORY_TRACT
  Filled 2017-04-11: qty 6.7

## 2017-04-11 NOTE — Progress Notes (Addendum)
Pharmacy Antibiotic Note  [name] is a 82 y.o. female admitted on 04/11/2017 with COPD exacerbation.  Pharmacy has been consulted for levaquin dosing. Pt is afebrile and WBC is elevated at 16.4. SCr is 1.29. Of note, levaquin listed under allergies because she has difficulty sleeping. This is an intolerance will need to monitor patients response.   Plan: Levaquin  IV Q48H - will start tomorrow since pt received a dose of azithromycin today F/u renal fxn, C&S, clinical status and LOT  Height:  (154.9 cm) Weight: 115 lb 4.8 oz (52.3 kg) IBW/kg (Calculated) : 47.8  Temp (24hrs), Avg:97.2 F (36.2 C), Min:97.2 F (36.2 C), Max:97.2 F (36.2 C)   Recent Labs Lab 04/04/17 1444 04/11/17 0845  WBC  --  16.4*  CREATININE 1.06 1.29*    Estimated Creatinine Clearance: 26.7 mL/min (A) (by C-G formula based on SCr of 1.29 mg/dL (H)).    Allergies  Allergen Reactions  . Levaquin [Levofloxacin In D5w] Other (See Comments)    Can't sleep   . Penicillins Hives    Has taken it since.  . Pneumococcal Vaccine Swelling    Severe localized swelling in arm  . Pneumovax [Pneumococcal Polysaccharide Vaccine] Hives    Antimicrobials this admission: Levaquin 10/3>> Azithro x 1 10/2  Dose adjustments this admission: N/A  Microbiology results: Pending  Thank you for allowing pharmacy to be a part of this patient's care.  Matilde Markie, Drake Leach 04/11/2017 10:59 AM

## 2017-04-11 NOTE — Progress Notes (Signed)
RN called d/t pt wants bipap off.  Upon entering room, pt had already removed bipap, and demanding her oxygen- bipap/vent alarming.  I spoke w/ pt and family member that pt needs to wait on staff to remove bipap to ensure pt safety and for oxygen placement.  Pt and family aware to not remove oxygen or bipap without RN/RT present.    Currently pt on Corn because she is refusing bipap, pt appears SOB w/ increased WOB noted.  Sat 95% on 5 lpm [place].

## 2017-04-11 NOTE — ED Notes (Signed)
Water given to pt per Kendal Hymen, Charity fundraiser.

## 2017-04-11 NOTE — Progress Notes (Signed)
Pt requesting to come off bipap, per MD okay to trial off bipap.  Pt moved to another room in ED, sat 95-98% on 5 lpm [place]. Pt started on 10 mg albuterol CAT. RN aware.

## 2017-04-11 NOTE — ED Notes (Signed)
Attempted to call report to 2C x 1. 

## 2017-04-11 NOTE — Progress Notes (Signed)
Patient daughter refused CBG monitoring at this time because she is sleeping and does not want to be woken up. Will continue to monitor and try to get CBG at later time. Lavell Anchors, RN

## 2017-04-11 NOTE — ED Notes (Signed)
Pt taking own Cymbacort -- spoke with dr Margot Ables -- will place order

## 2017-04-11 NOTE — H&P (Signed)
History and Physical    [name] YTK:[id number] DOB: 09/04/1934 DOA: 04/11/2017   PCP: Velna Hatchet, MD   Patient coming from:  Home    Chief Complaint: Shortness of breath  HPI: [name] is a 82 y.o. female with extensive medical history listed below, including diastolic CHF, hypertension, CAD, anemia, anxiety, severe COPD-emphysema, with prior hospitalizations for exacerbation, last from 8/28   through 03/14/2017, discharged on prednisone taper to 0, presenting to the [facility], as the patient was dyspneic, with pursed lip breathing, unable to speak due to shortness of breath. She was initially intolerant to BiPAP, so she had to be placed on Ventimask in route, and received 1 dose of albuterol. On arrival, the patient was given Solu-Medrol IV 125 mg, and continued albuterol was initiated, with some relief. She had a recent change in medications, including Atrovent nebulizer 3 times a day 24 times a day, and Prednisone had been decreased to 10 mg a day, from 20.  SHe also has been using Morphine sublingual prn for SOB . She is on oxygen at home 2 L continuous.Denies rhinorrhea or hemoptysis. Denies fevers, chills, night sweats or mucositis.Denies any chest pain, chest wall pain or palpitations.Denies any sick contacts Denies any abdominal pain. Has decreased appetite due to current symptoms. Denies nausea or vomiting. Denies dizziness or vertigo. Denies lower extremity swelling. No confusion was reported. Denies any vision changes, double vision or headaches.     ED Course:  BP 133/81   Pulse (!) 111   Temp (!) 97.2 F (36.2 C) (Oral)   Resp (!) 26   Ht _0  (1.549 m)   Wt 52.3 kg (115 lb 4.8 oz)   LMP  (LMP Unknown)   SpO2 95%   BMI 21.79 kg/m    As mentioned above, the patient received Solu-Medrol, continued albuterol nebulizer, and also received 1 dose of Zithromax She was placed on BiPAP ABGs showed normal pH 7.4, PCO2 47.6, P0 to 112 bicarbonate 31. White count  16.4 BNP 164 EKG tachycardia Troponin less than 0.03 Chest x-ray  Extensive upper lobe bullous disease, stable. No edema or consolidation. Small pleural effusions bilaterally Last 2-D echo 03/09/2017 EF 55-73%, grade 1 diastolic dysfunction.   Review of Systems:  As per HPI otherwise all other systems reviewed and are negative  Past Medical History:  Diagnosis Date  . Actinomycosis, cervicofacial   . CAD (coronary artery disease)    a. coronary calcification on prior Chest CT;  b. Dob MV 1/14:  EF 85%, no ischemia  . COPD (chronic obstructive pulmonary disease) ([place])   . Emphysema   . HTN (hypertension)   . Hx of echocardiogram    Echocardiogram 04/16/12: Mild focal basal septal hypertrophy, EF 70-75% with dynamic obstruction with mid cavity obliteration, grade 1 diastolic dysfunction, MAC, atrial septal thickening with findings consistent with lipomatous hypertrophy  . Hyperlipidemia   . PVD (peripheral vascular disease) ([place])     Past Surgical History:  Procedure Laterality Date  . APPENDECTOMY    . BONE GRAFT HIP ILIAC CREST    . TONSILLECTOMY      Social History Social History   Social History  . Marital status: Married    Spouse name: N/A  . Number of children: 4  . Years of education: N/A   Occupational History  . retired Retired    Air cabin crew   Social History Main Topics  . Smoking status: Former Smoker    Packs/day: 1.00  Years: 49.00    Types: Cigarettes    Quit date: 07/11/2005  . Smokeless tobacco: Never Used     Comment: smoked for 48 years 1 ppd  . Alcohol use No  . Drug use: No  . Sexual activity: Not on file   Other Topics Concern  . Not on file   Social History Narrative  . No narrative on file     Allergies  Allergen Reactions  . Levaquin [Levofloxacin In D5w] Other (See Comments)    Can't sleep   . Penicillins Hives    Has taken it since.  . Pneumococcal Vaccine Swelling    Severe localized swelling in arm  . Pneumovax  [Pneumococcal Polysaccharide Vaccine] Hives    Family History  Problem Relation Age of Onset  . Heart disease Mother   . Emphysema Father   . Heart disease Father   . Stomach cancer Father   . Coronary artery disease Unknown        family hx of female 1st. degree relative <50/family hx of 1st. degree relative <60  . Emphysema Sister        SMOKER  . Stomach cancer Sister   . Emphysema Sister        SMOKER  . Coronary artery disease Brother        SMOKER  . Emphysema Sister       Prior to Admission medications   Medication Sig Start Date End Date Taking? Authorizing Provider  albuterol (PROVENTIL HFA;VENTOLIN HFA) 108 (90 BASE) MCG/ACT inhaler Inhale 2 puffs into the lungs every 6 (six) hours as needed. For shortness of breath    [provider]  aspirin 81 MG tablet Take 81 mg by mouth daily.      [provider]  aspirin-acetaminophen-caffeine (EXCEDRIN MIGRAINE) [phone] MG tablet Take 1 tablet by mouth every 6 (six) hours as needed for headache.    [provider]  atorvastatin (LIPITOR) 20 MG tablet Take 1 tablet (20 mg total) by mouth daily. 04/29/16   Dorothy Spark, MD  budesonide-formoterol [facility]) 160-4.5 MCG/ACT inhaler Inhale 2 puffs into the lungs 2 (two) times daily. 12/12/16   Byrum, Rose Fillers, MD  BYSTOLIC 5 MG tablet TAKE 1 TABLET (5 MG TOTAL) BY MOUTH DAILY. 12/12/16   Imogene Burn, PA-C  Calcium Carb-Cholecalciferol (CALCIUM 600 + D PO) Take 1 tablet by mouth daily.    [provider]  clonazePAM (KLONOPIN) 0.5 MG tablet Take 0.5 tablets (0.25 mg total) by mouth 2 (two) times daily. 01/05/16   Collene Gobble, MD  clotrimazole (MYCELEX) 10 MG troche Take 1 tablet (10 mg total) by mouth 5 (five) times daily. 04/04/17   Parrett, Fonnie Mu, NP  furosemide (LASIX) 40 MG tablet Take 1 tablet (40 mg total) by mouth daily. 03/15/17   Sheikh, Omair Latif, DO  guaiFENesin (MUCINEX) 600 MG 12 hr tablet Take 2 tablets (1,200 mg total) by  mouth 2 (two) times daily. 03/14/17   Raiford Noble Latif, DO  ipratropium (ATROVENT) 0.02 % nebulizer solution Take 2.5 mLs (0.5 mg total) by nebulization 4 (four) times daily. DX: J44.9 04/04/17   Parrett, Fonnie Mu, NP  Magnesium 500 MG CAPS Take 1 capsule by mouth daily.    [provider]  Morphine Sulfate (MORPHINE CONCENTRATE) 10 mg / 0.5 ml concentrated solution Place 0.13-0.25 mLs (2.6-5 mg total) under the tongue every 2 (two) hours as needed for shortness of breath. 03/14/17   Kerney Elbe, DO  Multiple Vitamin (MULTIVITAMIN) tablet Take 1 tablet by mouth daily.      [provider]  ondansetron (ZOFRAN) 4 MG tablet Take 1 tablet (4 mg total) by mouth every 6 (six) hours as needed for nausea. 03/14/17   Sheikh, Omair Latif, DO  pantoprazole (PROTONIX) 40 MG tablet Take 1 tablet (40 mg total) by mouth daily. 03/15/17   Sheikh, Georgina Quint Latif, DO  polyethylene glycol [facility] / GLYCOLAX) packet Take 17 g by mouth 2 (two) times daily. 03/14/17   Raiford Noble Latif, DO  predniSONE (DELTASONE) 10 MG tablet Take 1 tablet (10 mg total) by mouth daily. 04/04/17   Parrett, Fonnie Mu, NP  senna-docusate (SENOKOT-S) 8.6-50 MG tablet Take 2 tablets by mouth 2 (two) times daily. 03/14/17   Sheikh, Omair Latif, DO  SPIRIVA RESPIMAT 2.5 MCG/ACT AERS INHALE 2 PUFFS INTO THE LUNGS DAILY. 10/21/16   Collene Gobble, MD  traZODone (DESYREL) 50 MG tablet Take 0.5 tablets (25 mg total) by mouth at bedtime as needed for sleep. 03/14/17   Sheikh, Omair Latif, DO  UNABLE TO FIND Inhale 1.5-3 L into the lungs continuous.     [provider]    Physical Exam:  Vitals:   04/11/17 0909 04/11/17 0915 04/11/17 0930 04/11/17 1000  BP:  124/73 121/77 133/81  Pulse:  (!) 105 (!) 111   Resp:  (!) 21 18 (!) 26  Temp:      TempSrc:      SpO2: 100% 91% 95%   Weight:      Height:       Constitutional: ill appearing, moderate distress due to shortness of breath Eyes: PERRL, lids and conjunctivae  normal ENMT: Mucous membranes are moist, without exudate or lesions  Neck: normal, supple, no masses, no thyromegaly Respiratory:  Minimal wheezing on R lung, trace crackles at bases. Respiratory effort noted, no use of accessory muscle. Cardiovascular:tachy rate and rhythm,  murmur, rubs or gallops. No extremity edema. 2+ pedal pulses. No carotid bruits.  Abdomen: Soft, non tender, No hepatosplenomegaly. Bowel sounds positive.  Musculoskeletal: no clubbing / cyanosis. Moves all extremities Skin: no jaundice, No lesions.  Neurologic: Sensation intact  Strength equal in all extremities Psychiatric:   Alert and oriented x 3. Anxious mood.     Labs on Admission: I have personally reviewed following labs and imaging studies  CBC:  Recent Labs Lab 04/11/17 0845  WBC 16.4*  NEUTROABS 11.7*  HGB 12.3  HCT 38.0  MCV 93.1  PLT 009    Basic Metabolic Panel:  Recent Labs Lab 04/04/17 1444 04/11/17 0845  NA 135 134*  K 3.7 3.2*  CL 89* 87*  CO2 36* 30  GLUCOSE 160* 219*  BUN 29* 14  CREATININE 1.06 1.29*  CALCIUM 9.5 9.4    GFR: Estimated Creatinine Clearance: 26.7 mL/min (A) (by C-G formula based on SCr of 1.29 mg/dL (H)).  Liver Function Tests:  Recent Labs Lab 04/11/17 0845  AST 26  ALT 22  ALKPHOS 59  BILITOT 0.7  PROT 7.0  ALBUMIN 3.4*   No results for input(s): LIPASE, AMYLASE in the last 168 hours. No results for input(s): AMMONIA in the last 168 hours.  Coagulation Profile: No results for input(s): INR, PROTIME in the last 168 hours.  Cardiac Enzymes:  Recent Labs Lab 04/11/17 0845  TROPONINI 0.03*    BNP (last 3 results)  Recent Labs  07/15/16 1542 01/26/17 1137 03/21/17 1314  PROBNP 90 91 94.0    HbA1C: No results  for input(s): HGBA1C in the last 72 hours.  CBG: No results for input(s): GLUCAP in the last 168 hours.  Lipid Profile: No results for input(s): CHOL, HDL, LDLCALC, TRIG, CHOLHDL, LDLDIRECT in the last 72  hours.  Thyroid Function Tests: No results for input(s): TSH, T4TOTAL, FREET4, T3FREE, THYROIDAB in the last 72 hours.  Anemia Panel: No results for input(s): VITAMINB12, FOLATE, FERRITIN, TIBC, IRON, RETICCTPCT in the last 72 hours.  Urine analysis:    Component Value Date/Time   COLORURINE YELLOW 10/16/2009 2023   APPEARANCEUR CLEAR 10/16/2009 2023   LABSPEC 1.018 10/16/2009 2023   PHURINE 6.0 10/16/2009 2023   GLUCOSEU NEGATIVE 10/16/2009 2023   HGBUR NEGATIVE 10/16/2009 2023   BILIRUBINUR NEGATIVE 10/16/2009 2023   KETONESUR 15 (A) 10/16/2009 2023   PROTEINUR 30 (A) 10/16/2009 2023   UROBILINOGEN 0.2 10/16/2009 2023   NITRITE NEGATIVE 10/16/2009 2023   LEUKOCYTESUR NEGATIVE 10/16/2009 2023    Sepsis Labs: _0 (procalcitonin:4,lacticidven:4) )No results found for this or any previous visit (from the past 240 hour(s)).   Radiological Exams on Admission: Dg Chest Port 1 View  Result Date: 04/11/2017 CLINICAL DATA:  Respiratory distress EXAM: PORTABLE CHEST 1 VIEW COMPARISON:  March 11, 2017 FINDINGS: There is extensive upper lobe bullous disease. Lungs are somewhat hyperexpanded. There are small pleural effusions bilaterally. There is no edema or consolidation. The heart size is normal. Pulmonary vascularity reflects the underlying bullous disease and is stable. There is aortic atherosclerosis. No evident bone lesions. IMPRESSION: Extensive upper lobe bullous disease, stable. No edema or consolidation. Small pleural effusions bilaterally. Cardiac silhouette within normal limits. There is aortic atherosclerosis. Aortic Atherosclerosis (ICD10-I70.0). Electronically Signed   By: Lowella Grip III M.D.   On: 04/11/2017 09:07    EKG: Independently reviewed.  Assessment/Plan Active Problems:   Respiratory distress   Acute on chronic respiratory failure with hypoxia and hypercapnia (HCC)   EMPHYSEMA NEC   COPD (chronic obstructive pulmonary disease) (HCC)   Carotid  artery disease (HCC)   HTN (hypertension)   Hyperlipidemia   Hypoxemia   Anxiety state   Hypertrophic cardiomyopathy (HCC)   Iron deficiency anemia   Shortness of breath    Acute on chronic respiratory failure with hypoxia secondary to acute on chronic COPD. Admitted for the  for treatment of the same. Patient is on oxygen at home between 2-3 L all day. Had seen her pulmonologist 1 week ago, at which time prednisone had been reduced from 20 mg daily to 10 mg daily. She is afebrile. Chest x-ray does not show pneumonia.  Initial ABG shows normal pH 7.4, PCO2 47.6, P0 to 112 bicarbonate 31.White count 16.4 Stepdown inpatient  Levaquin Duonebs q4 hrs  Albuterol every 2 hours prn Solu-Medrol 60 mg IV every 8 h Continue morphine concentrate 2.5-5 mg sublingual every 2 hours when necessary for shortness of breath  Mucinex prn O2 CBC in a.m. Blood and sputum cultures Respiratory therapy consult Monitored respiratory effort and oxygen saturation L, and placed back on BiPAP if indicated   History of CAD, tachycardia, likely related to above symptoms. Initial troponin negative. She denies any chest pain. EKG sinus tachycardia without ACS. TN negative Monitored telemetry Continue home meds  Hyperglycemia, likely steroid-induced. Serum glucose of 200 on admission. No history of diabetes. Last hemoglobin A1c was 5.6. Sliding scale insulin   Hypertension BP 120/80   Pulse 107   Controlled Continue home anti-hypertensive medications   Hyperlipidemia Continue home statins   Chronic diastolic heart failure. HCM,  Echo in March 2017 showed vigorous LVEF with an EF of 65% mild grade 1 diastolic dysfunction. Appears compensated. CXR with stable cardiac siluette  BNP 164   Careful use of IVF  Daily weights, strict I/O Lasix bid, patient took her dose today, will resume tomorrow CXR in am   Chronic kidney disease stage    baseline creatinine 1   Current Cr 1.29  Lab Results  Component  Value Date   CREATININE 1.29 (H) 04/11/2017   CREATININE 1.06 04/04/2017   CREATININE 1.19 (H) 03/14/2017  Gentle IV fluids CMET in am  Hold Lasix dose till  AM   Anemia of chronic disease Hemoglobin on admission 12.3, stable Repeat CBC in am  No transfusion is indicated at this time  Anxiety Continue home Klonopin, Desyrel qhs prn  GERD, no acute symptoms Continue PPI   DVT prophylaxis:  Lovenox Code Status:    Limited. DNI  Family Communication:  Discussed with patient Disposition Plan: Expect patient to be discharged to home after condition improves Consults called:    None Admission status: SDU   [facility], PA-C [facility]   04/11/2017, 10:13 AM

## 2017-04-11 NOTE — ED Notes (Signed)
Patient demanding Bipap to be removed. RT notified. Patient began pulling Bipap off face prior to RT arrival.

## 2017-04-11 NOTE — ED Triage Notes (Signed)
Pt from home via EMS. Complaints of SOB since yesterday with increased work of breathing this morning. Pt used home rescue albuterol x2 and atrovent nebulizer x1. EMS gave 125 solu-medrol, duoneb with CPAP but did not tolerate CPAP. 20 G in LAC. On arrival, pt tripoding, pursed lip breathing, accessory muscle use, and on 6L [place].

## 2017-04-11 NOTE — ED Notes (Signed)
Pt unable to tolerate being off bipap-- asking to go back on-- resp notified.

## 2017-04-11 NOTE — ED Notes (Signed)
Placed purewick on pt. Pt understood and tolerated well.

## 2017-04-11 NOTE — ED Provider Notes (Signed)
[facility] DEPT Provider Note   CSN: [id number] Arrival date & time: 04/11/17  8250     History   Chief Complaint Chief Complaint  Patient presents with  . Respiratory Distress    HPI LAVENE PENAGOS is a 82 y.o. female.  HPI Patient presents in respiratory distress. She arrives viaEMS. Patient is dyspneic, tripoding, with pursed lip breathing, able to speak only in very brief bits of conversation, nodding,. EMS reports the patient called 911 due to dyspnea. Patient was intolerant of BiPAP, Ventimask in route, did receive steroids, but had persistent dyspnea, respiratory distress en route. The patient herself cannot answer additional questions, secondary to acute his condition, level V caveat.  Past Medical History:  Diagnosis Date  . Actinomycosis, cervicofacial   . CAD (coronary artery disease)    a. coronary calcification on prior Chest CT;  b. Dob MV 1/14:  EF 85%, no ischemia  . COPD (chronic obstructive pulmonary disease) ([place])   . Emphysema   . HTN (hypertension)   . Hx of echocardiogram    Echocardiogram 04/16/12: Mild focal basal septal hypertrophy, EF 70-75% with dynamic obstruction with mid cavity obliteration, grade 1 diastolic dysfunction, MAC, atrial septal thickening with findings consistent with lipomatous hypertrophy  . Hyperlipidemia   . PVD (peripheral vascular disease) [facility])     Patient Active Problem List   Diagnosis Date Noted  . Oral candidiasis 04/04/2017  . SOB (shortness of breath)   . Palliative care encounter   . Goals of care, counseling/discussion   . Palliative care by specialist   . Chronic respiratory failure with hypoxia and hypercapnia ([place]) 03/07/2017  . Hyperglycemia 03/07/2017  . Shortness of breath 07/29/2016  . Acute diastolic heart failure ([place]) 01/27/2016  . Acute on chronic diastolic CHF (congestive heart failure), NYHA class 3 ([place]) 01/27/2016  . DOE (dyspnea on exertion) 01/27/2016  . Hypertensive heart disease with  CHF (congestive heart failure) ([place]) 01/27/2016  . Diastolic dysfunction 03/70/4888  . CAD in native artery   . HLD (hyperlipidemia)   . Anxiety state   . Hypertrophic cardiomyopathy ([employer])   . Malnutrition of moderate degree 09/14/2015  . Elevated troponin   . Respiratory distress 09/09/2015  . Acute on chronic respiratory failure with hypoxia and hypercapnia ([place]) 09/09/2015  . Sepsis ([place]) 09/09/2015  . Leukocytosis   . Elevated troponin I measurement   . CAD (coronary artery disease), native coronary artery 01/17/2013  . Carotid stenosis 05/09/2012  . Dynamic left ventricular outflow obstruction 04/19/2012  . Multiple lung nodules 04/19/2012  . Hyperlipidemia 04/16/2012  . Hypoxemia 04/16/2012  . HTN (hypertension) 07/08/2011  . Carotid artery disease ([place]) 03/03/2011  . Occlusion and stenosis of carotid artery without mention of cerebral infarction 02/14/2011  . COPD (chronic obstructive pulmonary disease) ([place]) 06/15/2010  . DYSPNEA 06/15/2010  . Osteoporosis 01/29/2010  . PVD 09/29/2008  . Aortic aneurysm ([place]) 03/10/2008  . Phlebitis and thrombophlebitis of superficial veins of upper extremities 10/17/2007  . Embolism and thrombosis of artery ([place]) 09/08/2007  . Iron deficiency anemia 09/08/2007  . Tachycardia 09/08/2007  . Embolism and thrombosis of other specified artery(444.89) 09/08/2007  . Anxiety 01/09/2007  . CANDIDIASIS, VAGINAL 12/12/2006  . ACTINOMYCOSIS, CERVICOFACIAL 12/01/2006  . EMPHYSEMA NEC 12/01/2006    Past Surgical History:  Procedure Laterality Date  . APPENDECTOMY    . BONE GRAFT HIP ILIAC CREST    . TONSILLECTOMY      OB History    No data available  Home Medications    Prior to Admission medications   Medication Sig Start Date End Date Taking? Authorizing Provider  albuterol (PROVENTIL HFA;VENTOLIN HFA) 108 (90 BASE) MCG/ACT inhaler Inhale 2 puffs into the lungs every 6 (six) hours as needed. For shortness of breath     [provider]  aspirin 81 MG tablet Take 81 mg by mouth daily.      [provider]  aspirin-acetaminophen-caffeine (EXCEDRIN MIGRAINE) [id number] MG tablet Take 1 tablet by mouth every 6 (six) hours as needed for headache.    [provider]  atorvastatin (LIPITOR) 20 MG tablet Take 1 tablet (20 mg total) by mouth daily. 04/29/16   Dorothy Spark, MD  budesonide-formoterol [facility]) 160-4.5 MCG/ACT inhaler Inhale 2 puffs into the lungs 2 (two) times daily. 12/12/16   Byrum, Rose Fillers, MD  BYSTOLIC 5 MG tablet TAKE 1 TABLET (5 MG TOTAL) BY MOUTH DAILY. 12/12/16   Imogene Burn, PA-C  Calcium Carb-Cholecalciferol (CALCIUM 600 + D PO) Take 1 tablet by mouth daily.    [provider]  clonazePAM (KLONOPIN) 0.5 MG tablet Take 0.5 tablets (0.25 mg total) by mouth 2 (two) times daily. 01/05/16   Collene Gobble, MD  clotrimazole (MYCELEX) 10 MG troche Take 1 tablet (10 mg total) by mouth 5 (five) times daily. 04/04/17   Parrett, Fonnie Mu, NP  furosemide (LASIX) 40 MG tablet Take 1 tablet (40 mg total) by mouth daily. 03/15/17   Sheikh, Omair Latif, DO  guaiFENesin (MUCINEX) 600 MG 12 hr tablet Take 2 tablets (1,200 mg total) by mouth 2 (two) times daily. 03/14/17   Raiford Noble Latif, DO  ipratropium (ATROVENT) 0.02 % nebulizer solution Take 2.5 mLs (0.5 mg total) by nebulization 4 (four) times daily. DX: J44.9 04/04/17   Parrett, Fonnie Mu, NP  Magnesium 500 MG CAPS Take 1 capsule by mouth daily.    [provider]  Morphine Sulfate (MORPHINE CONCENTRATE) 10 mg / 0.5 ml concentrated solution Place 0.13-0.25 mLs (2.6-5 mg total) under the tongue every 2 (two) hours as needed for shortness of breath. 03/14/17   Raiford Noble Latif, DO  Multiple Vitamin (MULTIVITAMIN) tablet Take 1 tablet by mouth daily.      [provider]  ondansetron (ZOFRAN) 4 MG tablet Take 1 tablet (4 mg total) by mouth every 6 (six) hours as needed for nausea. 03/14/17   Sheikh, Omair  Latif, DO  pantoprazole (PROTONIX) 40 MG tablet Take 1 tablet (40 mg total) by mouth daily. 03/15/17   Sheikh, Georgina Quint Latif, DO  polyethylene glycol [facility] / GLYCOLAX) packet Take 17 g by mouth 2 (two) times daily. 03/14/17   Raiford Noble Latif, DO  predniSONE (DELTASONE) 10 MG tablet Take 1 tablet (10 mg total) by mouth daily. 04/04/17   Parrett, Fonnie Mu, NP  senna-docusate (SENOKOT-S) 8.6-50 MG tablet Take 2 tablets by mouth 2 (two) times daily. 03/14/17   Sheikh, Omair Latif, DO  SPIRIVA RESPIMAT 2.5 MCG/ACT AERS INHALE 2 PUFFS INTO THE LUNGS DAILY. 10/21/16   Collene Gobble, MD  traZODone (DESYREL) 50 MG tablet Take 0.5 tablets (25 mg total) by mouth at bedtime as needed for sleep. 03/14/17   Sheikh, Omair Latif, DO  UNABLE TO FIND Inhale 1.5-3 L into the lungs continuous.     [provider]    Family History Family History  Problem Relation Age of Onset  . Heart disease Mother   . Emphysema Father   . Heart disease Father   .  Stomach cancer Father   . Coronary artery disease Unknown        family hx of female 1st. degree relative <50/family hx of 1st. degree relative <60  . Emphysema Sister        SMOKER  . Stomach cancer Sister   . Emphysema Sister        SMOKER  . Coronary artery disease Brother        SMOKER  . Emphysema Sister     Social History Social History  Substance Use Topics  . Smoking status: Former Smoker    Packs/day: 1.00    Years: 49.00    Types: Cigarettes    Quit date: 07/11/2005  . Smokeless tobacco: Never Used     Comment: smoked for 48 years 1 ppd  . Alcohol use No     Allergies   Levaquin [levofloxacin in d5w]; Penicillins; Pneumococcal vaccine; and Pneumovax [pneumococcal polysaccharide vaccine]   Review of Systems Review of Systems  Unable to perform ROS: Acuity of condition     Physical Exam Updated Vital Signs LMP  (LMP Unknown)   SpO2 92% Comment: on 6L [place]  Physical Exam  Constitutional: She is oriented to person, place, and  time. She appears distressed.  Sickly appearing elderly female in edentulous, arising in respiratory distress, with pursed lip breathing, tachypnea, tripoding.  HENT:  Head: Normocephalic and atraumatic.  Eyes: Conjunctivae and EOM are normal.  Cardiovascular:  Tachycardia, appreciable distal pulses symmetric  Pulmonary/Chest: No stridor.  Minimal air motion on initial exam  Abdominal: She exhibits no distension. There is no tenderness.  Musculoskeletal: She exhibits no edema.  Neurological: She is alert and oriented to person, place, and time. No cranial nerve deficit.  Atrophy, otherwise unremarkable  Skin: Skin is warm. She is diaphoretic.  Psychiatric:  anxious  Nursing note and vitals reviewed.    ED Treatments / Results  Labs (all labs ordered are listed, but only abnormal results are displayed) Labs Reviewed  COMPREHENSIVE METABOLIC PANEL - Abnormal; Notable for the following:       Result Value   Sodium 134 (*)    Potassium 3.2 (*)    Chloride 87 (*)    Glucose, Bld 219 (*)    Creatinine, Ser 1.29 (*)    Albumin 3.4 (*)    GFR calc non Af Amer 38 (*)    GFR calc Af Amer 44 (*)    Anion gap 17 (*)    All other components within normal limits  CBC WITH DIFFERENTIAL/PLATELET - Abnormal; Notable for the following:    WBC 16.4 (*)    Neutro Abs 11.7 (*)    Monocytes Absolute 1.4 (*)    All other components within normal limits  TROPONIN I - Abnormal; Notable for the following:    Troponin I 0.03 (*)    All other components within normal limits  BRAIN NATRIURETIC PEPTIDE - Abnormal; Notable for the following:    B Natriuretic Peptide 164.4 (*)    All other components within normal limits    EKG  EKG Interpretation  Date/Time:  Tuesday April 11 2017 08:43:32 EDT Ventricular Rate:  116 PR Interval:    QRS Duration: 82 QT Interval:  325 QTC Calculation: 452 R Axis:   81 Text Interpretation:  Sinus tachycardia Ventricular premature complex ST-t wave  abnormality Artifact Abnormal ekg Confirmed by Carmin Muskrat [phone]) on 04/11/2017 8:49:45 AM       Radiology Dg Chest Port 1 View  Result Date: 04/11/2017  CLINICAL DATA:  Respiratory distress EXAM: PORTABLE CHEST 1 VIEW COMPARISON:  March 11, 2017 FINDINGS: There is extensive upper lobe bullous disease. Lungs are somewhat hyperexpanded. There are small pleural effusions bilaterally. There is no edema or consolidation. The heart size is normal. Pulmonary vascularity reflects the underlying bullous disease and is stable. There is aortic atherosclerosis. No evident bone lesions. IMPRESSION: Extensive upper lobe bullous disease, stable. No edema or consolidation. Small pleural effusions bilaterally. Cardiac silhouette within normal limits. There is aortic atherosclerosis. Aortic Atherosclerosis (ICD10-I70.0). Electronically Signed   By: Lowella Grip III M.D.   On: 04/11/2017 09:07    Procedures Procedures (including critical care time)  Medications Ordered in ED Medications  albuterol (PROVENTIL) (2.5 MG/3ML) 0.083% nebulizer solution 5 mg (not administered)     Initial Impression / Assessment and Plan / ED Course  I have reviewed the triage vital signs and the nursing notes.  Pertinent labs & imaging results that were available during my care of the patient were reviewed by me and considered in my medical decision making (see chart for details). immediately after the initial evaluation the patient was transferred to our cardiac wandering equipment, continue to receive supplemental oxygen and we initiated a trial of BiPAP.  9:38 AM Patient slightly improved, now starting a continuous neb. Husband arrived, he states that since recent hospitalization last month she has not returned to normal strength. They are attempting to obtain a nocturnal BiPAP system with the assistance of their pulmonology team.   10:44 AM Patient is transitioning from BiPAP He appears substantially  better. However, given concern for COPD exacerbation, persistent need for ongoing treatment, monitoring, the patient requires admission for further evaluation and management.  Final Clinical Impressions(s) / ED Diagnoses  Respiratory distress COPD exacerbation  CRITICAL CARE Performed by: Carmin Muskrat Total critical care time: 45 minutes Critical care time was exclusive of separately billable procedures and treating other patients. Critical care was necessary to treat or prevent imminent or life-threatening deterioration. Critical care was time spent personally by me on the following activities: development of treatment plan with patient and/or surrogate as well as nursing, discussions with consultants, evaluation of patient's response to treatment, examination of patient, obtaining history from patient or surrogate, ordering and performing treatments and interventions, ordering and review of laboratory studies, ordering and review of radiographic studies, pulse oximetry and re-evaluation of patient's condition.    Carmin Muskrat, MD 04/11/17 1045

## 2017-04-11 NOTE — Progress Notes (Signed)
RN called d/t pt c/o SOB, pt w/ increased WOB saying "help".  Placed back on bipap.  RN at bedside and aware.  VSS, sat 100%.

## 2017-04-12 ENCOUNTER — Inpatient Hospital Stay (HOSPITAL_COMMUNITY): Payer: PPO

## 2017-04-12 DIAGNOSIS — I422 Other hypertrophic cardiomyopathy: Secondary | ICD-10-CM

## 2017-04-12 DIAGNOSIS — Z7189 Other specified counseling: Secondary | ICD-10-CM

## 2017-04-12 DIAGNOSIS — J9622 Acute and chronic respiratory failure with hypercapnia: Secondary | ICD-10-CM

## 2017-04-12 DIAGNOSIS — J449 Chronic obstructive pulmonary disease, unspecified: Secondary | ICD-10-CM

## 2017-04-12 DIAGNOSIS — I5032 Chronic diastolic (congestive) heart failure: Secondary | ICD-10-CM

## 2017-04-12 DIAGNOSIS — Z515 Encounter for palliative care: Secondary | ICD-10-CM

## 2017-04-12 LAB — GLUCOSE, CAPILLARY
GLUCOSE-CAPILLARY: 143 mg/dL — AB (ref 65–99)
GLUCOSE-CAPILLARY: 84 mg/dL (ref 65–99)
GLUCOSE-CAPILLARY: 128 mg/dL — AB (ref 65–99)
Glucose-Capillary: 130 mg/dL — ABNORMAL HIGH (ref 65–99)

## 2017-04-12 LAB — BLOOD GAS, ARTERIAL
ACID-BASE EXCESS: 8.1 mmol/L — AB (ref 0.0–2.0)
BICARBONATE: 33.5 mmol/L — AB (ref 20.0–28.0)
DRAWN BY: 51155
O2 CONTENT: 2 L/min
O2 Saturation: 98.7 %
PATIENT TEMPERATURE: 98.6
pCO2 arterial: 60.1 mmHg — ABNORMAL HIGH (ref 32.0–48.0)
pH, Arterial: 7.364 (ref 7.350–7.450)
pO2, Arterial: 131 mmHg — ABNORMAL HIGH (ref 83.0–108.0)

## 2017-04-12 LAB — COMPREHENSIVE METABOLIC PANEL
ALBUMIN: 2.6 g/dL — AB (ref 3.5–5.0)
ALT: 18 U/L (ref 14–54)
AST: 15 U/L (ref 15–41)
Alkaline Phosphatase: 41 U/L (ref 38–126)
Anion gap: 11 (ref 5–15)
BUN: 17 mg/dL (ref 6–20)
CALCIUM: 8.9 mg/dL (ref 8.9–10.3)
CHLORIDE: 90 mmol/L — AB (ref 101–111)
CO2: 34 mmol/L — AB (ref 22–32)
CREATININE: 1.01 mg/dL — AB (ref 0.44–1.00)
GFR calc non Af Amer: 52 mL/min — ABNORMAL LOW (ref >60)
GFR, EST AFRICAN AMERICAN: 60 mL/min — AB (ref >60)
Glucose, Bld: 136 mg/dL — ABNORMAL HIGH (ref 65–99)
Potassium: 4.1 mmol/L (ref 3.5–5.1)
SODIUM: 135 mmol/L (ref 135–145)
TOTAL PROTEIN: 5.2 g/dL — AB (ref 6.5–8.1)
Total Bilirubin: 0.7 mg/dL (ref 0.3–1.2)

## 2017-04-12 LAB — CBC
HCT: 29.1 % — ABNORMAL LOW (ref 36.0–46.0)
Hemoglobin: 9.9 g/dL — ABNORMAL LOW (ref 12.0–15.0)
MCH: 31.5 pg (ref 26.0–34.0)
MCHC: 34 g/dL (ref 30.0–36.0)
MCV: 92.7 fL (ref 78.0–100.0)
PLATELETS: 297 10*3/uL (ref 150–400)
RBC: 3.14 MIL/uL — AB (ref 3.87–5.11)
RDW: 13.3 % (ref 11.5–15.5)
WBC: 9.3 10*3/uL (ref 4.0–10.5)

## 2017-04-12 LAB — PROTIME-INR
INR: 0.98
Prothrombin Time: 12.9 seconds (ref 11.4–15.2)

## 2017-04-12 MED ORDER — MOMETASONE FURO-FORMOTEROL FUM 200-5 MCG/ACT IN AERO
2.0000 | INHALATION_SPRAY | Freq: Two times a day (BID) | RESPIRATORY_TRACT | Status: DC
  Administered 2017-04-13 – 2017-04-15 (×4): 2 via RESPIRATORY_TRACT
  Filled 2017-04-12 (×2): qty 8.8

## 2017-04-12 MED ORDER — MORPHINE SULFATE (CONCENTRATE) 10 MG/0.5ML PO SOLN
2.5000 mg | ORAL | Status: AC
  Administered 2017-04-12: 2.6 mg via SUBLINGUAL
  Filled 2017-04-12: qty 0.5

## 2017-04-12 MED ORDER — ALBUTEROL SULFATE (2.5 MG/3ML) 0.083% IN NEBU: 2.5000 mg | INHALATION_SOLUTION | Freq: Four times a day (QID) | RESPIRATORY_TRACT | Status: DC | PRN

## 2017-04-12 MED ORDER — MORPHINE SULFATE (CONCENTRATE) 10 MG/0.5ML PO SOLN
2.6000 mg | ORAL | Status: DC | PRN
  Administered 2017-04-12 – 2017-04-15 (×20): 2.6 mg via SUBLINGUAL
  Filled 2017-04-12 (×20): qty 0.5

## 2017-04-12 MED ORDER — CLONAZEPAM 0.5 MG PO TABS
0.2500 mg | ORAL_TABLET | Freq: Three times a day (TID) | ORAL | Status: DC
  Administered 2017-04-12 – 2017-04-15 (×10): 0.25 mg via ORAL
  Filled 2017-04-12 (×10): qty 1

## 2017-04-12 MED ORDER — MORPHINE SULFATE ER 15 MG PO TBCR
15.0000 mg | EXTENDED_RELEASE_TABLET | Freq: Two times a day (BID) | ORAL | Status: DC
  Administered 2017-04-12 – 2017-04-14 (×5): 15 mg via ORAL
  Filled 2017-04-12 (×5): qty 1

## 2017-04-12 MED ORDER — PREDNISONE 50 MG PO TABS
50.0000 mg | ORAL_TABLET | Freq: Two times a day (BID) | ORAL | Status: DC
  Administered 2017-04-12 – 2017-04-15 (×6): 50 mg via ORAL
  Filled 2017-04-12 (×6): qty 1

## 2017-04-12 MED ORDER — POLYETHYLENE GLYCOL 3350 17 G PO PACK
17.0000 g | PACK | Freq: Every day | ORAL | Status: DC
  Administered 2017-04-13 – 2017-04-15 (×2): 17 g via ORAL
  Filled 2017-04-12 (×3): qty 1

## 2017-04-12 MED ORDER — MORPHINE SULFATE 10 MG/5ML PO SOLN: 2.5000 mg | ORAL | Status: DC

## 2017-04-12 MED ORDER — SENNOSIDES-DOCUSATE SODIUM 8.6-50 MG PO TABS
2.0000 | ORAL_TABLET | Freq: Two times a day (BID) | ORAL | Status: DC
  Administered 2017-04-12 – 2017-04-15 (×6): 2 via ORAL
  Filled 2017-04-12 (×7): qty 2

## 2017-04-12 MED ORDER — MORPHINE SULFATE (CONCENTRATE) 10 MG/0.5ML PO SOLN: 2.6000 mg | ORAL | Status: DC | PRN

## 2017-04-12 MED ORDER — IPRATROPIUM BROMIDE 0.02 % IN SOLN
0.5000 mg | Freq: Three times a day (TID) | RESPIRATORY_TRACT | Status: DC
  Administered 2017-04-12: 0.5 mg via RESPIRATORY_TRACT
  Filled 2017-04-12 (×2): qty 2.5

## 2017-04-12 MED ORDER — LEVOFLOXACIN IN D5W 750 MG/150ML IV SOLN
750.0000 mg | INTRAVENOUS | Status: DC
  Administered 2017-04-12 – 2017-04-14 (×2): 750 mg via INTRAVENOUS
  Filled 2017-04-12 (×2): qty 150

## 2017-04-12 MED ORDER — MORPHINE SULFATE (CONCENTRATE) 10 MG/0.5ML PO SOLN
1.3000 mg | ORAL | Status: DC | PRN
  Administered 2017-04-12: 2.6 mg via SUBLINGUAL
  Filled 2017-04-12: qty 0.5

## 2017-04-12 NOTE — Progress Notes (Signed)
PT refused to wear Bipap tonight. RT will continue to monitor

## 2017-04-12 NOTE — Progress Notes (Signed)
No charge note:   Palliative consult received.   Left message for patient's spouse to schedule meeting time.  Ocie Bob, AGNP-C Palliative Medicine  Please call Palliative Medicine team phone with any questions [phone]. For individual providers please see AMION.

## 2017-04-12 NOTE — Progress Notes (Signed)
Pt currently has no IV access. IV consult unable to access and suggested Midline. RN will f/u with MD as to practicality of this and if pt will be going home 10/4.

## 2017-04-12 NOTE — Consult Note (Signed)
Consultation Note Date: 04/12/2017   Patient Name: [name]  DOB: [birth year]  MRN: [id number]  Age / Sex: 82 y.o., female  PCP: Velna Hatchet, MD Referring Physician: Allie Bossier, MD  Reason for Consultation: Establishing goals of care  HPI/Patient Profile: 82 y.o. female  with past medical history of severe COPD with hypoxic and hypercarbic respiratory failure, diastolic CHF, anemia, anxiety, CAD, HTN, admitted on 04/11/2017 with severe SOB- workup reveals acute on chronic respiratory failure with hypoxia secondary to COPD. This is her second admission in the last six months. Most recently she was discharged home on 03/15/2017 with plans for outpatient Palliative follow up. This was not arranged.    Clinical Assessment and Goals of Care:  I have reviewed medical records including EPIC notes, labs and imaging, received report from patient's RN, Mliss Sax, assessed the patient and then met at the bedside along with her husband to discuss diagnosis prognosis, GOC, EOL wishes, disposition and options.  I introduced Palliative Medicine as specialized medical care for people living with serious illness. It focuses on providing relief from the symptoms and stress of a serious illness. The goal is to improve quality of life for both the patient and the family.  We discussed a brief life review of the patient. She and her spouse have been married for 61 years. It will be 62 years in November. They have several children, grandchildren and great grandchildren.  As far as functional and nutritional status- patient's status has significantly declined over the last few months. She never returned to her baseline after her most recent discharge home. She has been staying in her bed, getting up only to use the bathroom or go to doctor visits. She is not eating as much as she was. Chart review notes a ten pound weight  loss over the last few months.    We discussed their current illness and what it means in the larger context of their on-going co-morbidities.  Natural disease trajectory and expectations at EOL were discussed. Patient feels her lifetime is limited. Her spouse is reluctant to discuss this. Patient expresses that she has no quality of life and is suffering. We discussed that COPD is a progressive irreversible lung disease with exacerbations that worsen and occur more frequently over time until death.  I attempted to elicit values and goals of care important to the patient.  The patient desires to be at home and comfortable for her end of life. She wants to die in familiar surroundings, with her family close by her.   The difference between aggressive medical intervention and comfort care was considered in light of the patient's goals of care. Her spouse expresses worries re: hastening death by choosing a comfort care path. The difference between hastening death and prolonging suffering was discussed.   Advanced directives, concepts specific to code status, artifical feeding and hydration, and rehospitalization were considered and discussed. Patient is currently limited code status. She does not want bipap currently- but wants to keep it as an option.  Hospice and Palliative Care services outpatient were explained and offered.  Questions and concerns were addressed.  Hard Choices booklet left for review. The family was encouraged to call with questions or concerns.    Primary Decision Maker PATIENT and her spouse- [name]     SUMMARY OF RECOMMENDATIONS -Continue current level of care with addition of more aggressive symptom management -Patient and spouse are considering transition to comfort care and Hospice services at home- they have been referred to [facility] but would likely benefit more from Hospice -Follow-up meeting planned with patient's children present either tomorrow or  Friday per patient and spouse's request -Symptom management-  *Patient was taking 2.61m SL morphine every two hours at home- even at night. Therefore- will start on MS CONTIN 15 mg PO BID for SOB  *Morphine 2.547mSL q1hr prn breakthrough SOB or anxiety  *Increase clonazepam to .2581mID PO  *Constipation- start 2 Senna po bid and miralax 17gm in 4oz water daily -ABG was attempted while in the room- resp tx was unable to obtain- pt became agitated and stated she didn't want "anything right now"- requested resp therapist wait to attempt test. Spouse and patient expressed that they weren't sure they wanted test and weren't sure of why test was needed. Discussed purpose of test and course of treatment based on test (use of bipap)- family stated no ABG for now.   Code Status/Advance Care Planning:  Limited code - bipap ok if patient requests    Symptom Management:   As above  Palliative Prophylaxis:   Delirium Protocol - Frequent assessment for SOB  Prognosis:    < 3 months due to end stage COPD with frequent exacerbations, multiple hospital admissions, disabling SOB at rest non responsive to bronchodialators, PPS less than 50%, unintentional weight loss (now down to 115lbs)  Discharge Planning: To Be Determined anticipate home with hospice  Primary Diagnoses: Present on Admission: . HTN (hypertension) . Hyperlipidemia . COPD (ch[place] obstructive pulmonary disease) ([place] EMPHYSEMA N[place] . Carotid artery disease ([place] Hypoxemia . Acute on chronic respiratory failure with hypoxia and hypercapnia (HCC) . Respiratory distress . Anxiety state . Iron deficiency anemia . Shortness of breath . Acute on chronic respiratory failure with hypoxia ([place] I have reviewed the medical record, interviewed the patient and family, and examined the patient. The following aspects are pertinent.  Past Medical History:  Diagnosis Date  . Actinomycosis, cervicofacial   . CAD (coronary artery  disease)    a. coronary calcification on prior Chest CT;  b. Dob MV 1/14:  EF 85%, no ischemia  . COPD (chronic obstructive pulmonary disease) ([place] . Emphysema   . HTN (hypertension)   . Hx of echocardiogram    Echocardiogram 04/16/12: Mild focal basal septal hypertrophy, EF 70-75% with dynamic obstruction with mid cavity obliteration, grade 1 diastolic dysfunction, MAC, atrial septal thickening with findings consistent with lipomatous hypertrophy  . Hyperlipidemia   . PVD (peripheral vascular disease) ([place]  Social History   Social History  . Marital status: Married    Spouse name: N/A  . Number of children: 4  . Years of education: N/A   Occupational History  . 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 Smoking status: Former Smoker    Packs/day: 1.00    Years: 49.00    Types: Cigarettes    Quit date: 07/11/2005  . Smokeless tobacco: Never Used  Comment: smoked for 48 years 1 ppd  . Alcohol use No  . Drug use: No  . Sexual activity: Not Asked   Other Topics Concern  . None   Social History Narrative  . None   Family History  Problem Relation Age of Onset  . Heart disease Mother   . Emphysema Father   . Heart disease Father   . Stomach cancer Father   . Coronary artery disease Unknown        family hx of female 1st. degree relative <50/family hx of 1st. degree relative <60  . Emphysema Sister        SMOKER  . Stomach cancer Sister   . Emphysema Sister        SMOKER  . Coronary artery disease Brother        SMOKER  . Emphysema Sister    Scheduled Meds: . aspirin EC  81 mg Oral Daily  . atorvastatin  20 mg Oral Daily  . clonazePAM  0.25 mg Oral TID  . enoxaparin (LOVENOX) injection  30 mg Subcutaneous Q24H  . furosemide  40 mg Oral Daily  . guaiFENesin  1,200 mg Oral BID  . insulin aspart  0-9 Units Subcutaneous TID WC  . ipratropium  0.5 mg Nebulization TID  . mometasone-formoterol  2 puff Inhalation BID  . morphine  15 mg  Oral Q12H  . morphine CONCENTRATE  2.6 mg Sublingual NOW  . nebivolol  5 mg Oral Daily  . pantoprazole  40 mg Oral Daily  . polyethylene glycol  17 g Oral Daily  . predniSONE  50 mg Oral BID WC  . senna-docusate  2 tablet Oral BID   Continuous Infusions: . albuterol Stopped (04/11/17 1130)  . levofloxacin (LEVAQUIN) IV Stopped (04/12/17 1330)   PRN Meds:.acetaminophen **OR** acetaminophen, albuterol, aspirin-acetaminophen-caffeine, bisacodyl, morphine CONCENTRATE, ondansetron **OR** ondansetron (ZOFRAN) IV, traZODone Medications Prior to Admission:  Prior to Admission medications   Medication Sig Start Date End Date Taking? Authorizing Provider  aspirin 81 MG tablet Take 81 mg by mouth daily.     Yes [provider]  aspirin-acetaminophen-caffeine (EXCEDRIN MIGRAINE) [phone] MG tablet Take 1 tablet by mouth every 6 (six) hours as needed for headache.   Yes [provider]  atorvastatin (LIPITOR) 20 MG tablet Take 1 tablet (20 mg total) by mouth daily. 04/29/16  Yes Dorothy Spark, MD  budesonide-formoterol [facility]) 160-4.5 MCG/ACT inhaler Inhale 2 puffs into the lungs 2 (two) times daily. 12/12/16  Yes Byrum, Rose Fillers, MD  BYSTOLIC 5 MG tablet TAKE 1 TABLET (5 MG TOTAL) BY MOUTH DAILY. 12/12/16  Yes Imogene Burn, PA-C  Calcium Carb-Cholecalciferol (CALCIUM 600 + D PO) Take 1 tablet by mouth daily.   Yes [provider]  clonazePAM (KLONOPIN) 0.5 MG tablet Take 0.5 tablets (0.25 mg total) by mouth 2 (two) times daily. 01/05/16  Yes Collene Gobble, MD  clotrimazole (MYCELEX) 10 MG troche Take 1 tablet (10 mg total) by mouth 5 (five) times daily. Patient taking differently: Take 10 mg by mouth daily as needed (to prevent thrush).  04/04/17  Yes Parrett, Tammy S, NP  furosemide (LASIX) 40 MG tablet Take 1 tablet (40 mg total) by mouth daily. Patient taking differently: Take 40 mg by mouth 2 (two) times daily.  03/15/17  Yes Sheikh, Omair Latif, DO  guaiFENesin  (MUCINEX) 600 MG 12 hr tablet Take 2 tablets (1,200 mg total) by mouth 2 (two) times daily. 03/14/17  Yes Raiford Noble [place],  DO  ipratropium (ATROVENT) 0.02 % nebulizer solution Take 2.5 mLs (0.5 mg total) by nebulization 4 (four) times daily. DX: J44.9 Patient taking differently: Take 0.5 mg by nebulization 3 (three) times daily. DX: J44.9 04/04/17  Yes Parrett, Tammy S, NP  Morphine Sulfate (MORPHINE CONCENTRATE) 10 mg / 0.5 ml concentrated solution Place 0.13-0.25 mLs (2.6-5 mg total) under the tongue every 2 (two) hours as needed for shortness of breath. 03/14/17  Yes Sheikh, Omair Latif, DO  pantoprazole (PROTONIX) 40 MG tablet Take 1 tablet (40 mg total) by mouth daily. 03/15/17  Yes Sheikh, Omair Latif, DO  predniSONE (DELTASONE) 10 MG tablet Take 1 tablet (10 mg total) by mouth daily. 04/04/17  Yes Parrett, Tammy S, NP  SPIRIVA RESPIMAT 2.5 MCG/ACT AERS INHALE 2 PUFFS INTO THE LUNGS DAILY. 10/21/16  Yes Collene Gobble, MD  traZODone (DESYREL) 50 MG tablet Take 0.5 tablets (25 mg total) by mouth at bedtime as needed for sleep. 03/14/17  Yes Sheikh, Omair Latif, DO  UNABLE TO FIND Inhale 1.5-3 L into the lungs continuous.    Yes [provider]  albuterol (PROVENTIL HFA;VENTOLIN HFA) 108 (90 BASE) MCG/ACT inhaler Inhale 2 puffs into the lungs every 6 (six) hours as needed. For shortness of breath    [provider]  ondansetron (ZOFRAN) 4 MG tablet Take 1 tablet (4 mg total) by mouth every 6 (six) hours as needed for nausea. 03/14/17   Sheikh, Georgina Quint Latif, DO  polyethylene glycol [facility] / GLYCOLAX) packet Take 17 g by mouth 2 (two) times daily. Patient not taking: Reported on 04/11/2017 03/14/17   Raiford Noble Latif, DO  senna-docusate (SENOKOT-S) 8.6-50 MG tablet Take 2 tablets by mouth 2 (two) times daily. Patient not taking: Reported on 04/11/2017 03/14/17   Raiford Noble Latif, DO   Allergies  Allergen Reactions  . Levaquin [Levofloxacin In D5w] Other (See Comments)    Can't sleep     . Penicillins Hives    Has patient had a PCN reaction causing immediate rash, facial/tongue/throat swelling, SOB or lightheadedness with hypotension: Yes Has patient had a PCN reaction causing severe rash involving mucus membranes or skin necrosis: Yes Has patient had a PCN reaction that required hospitalization: No Has patient had a PCN reaction occurring within the last 10 years: Yes If all of the above answers are "NO", then may proceed with Cephalosporin use.   . Pneumovax [Pneumococcal Polysaccharide Vaccine] Hives and Swelling   Review of Systems  Constitutional: Positive for activity change, appetite change and fatigue.  Respiratory: Positive for shortness of breath and wheezing.   Gastrointestinal: Positive for constipation.  Psychiatric/Behavioral: Positive for agitation and sleep disturbance. The patient is nervous/anxious.     Physical Exam  Constitutional: She is oriented to person, place, and time. She appears distressed.  Frail, elderly, ill appearing  HENT:  Head: Normocephalic and atraumatic.  Cardiovascular:  tachycardic  Pulmonary/Chest: She has wheezes.  Abdominal: Soft. She exhibits distension.  Neurological: She is alert and oriented to person, place, and time.  Skin: There is pallor.  Psychiatric: Her behavior is normal. Judgment and thought content normal.  anxious  Nursing note and vitals reviewed.   Vital Signs: BP 100/61 (BP Location: Right Arm)   Pulse 71   Temp 97.8 F (36.6 C) (Oral)   Resp 17   Ht 5' 1"  (1.549 m)   Wt 52.4 kg (115 lb 8 oz)   LMP  (LMP Unknown)   SpO2 100%   BMI 21.82 kg/m  Pain Assessment: No/denies pain       SpO2: SpO2: 100 % O2 Device:SpO2: 100 % O2 Flow Rate: .O2 Flow Rate (L/min): 3.5 L/min  IO: Intake/output summary:  Intake/Output Summary (Last 24 hours) at 04/12/17 1625 Last data filed at 04/12/17 1300  Gross per 24 hour  Intake          3112.92 ml  Output              700 ml  Net          2412.92 ml     LBM: Last BM Date: 04/11/17 Baseline Weight: Weight: 52.3 kg (115 lb 4.8 oz) Most recent weight: Weight: 52.4 kg (115 lb 8 oz)     Palliative Assessment/Data: PPS: 20%     Thank you for this consult. Palliative medicine will continue to follow and assist as needed.   Time In: 1500 Time Out: 1710 Time Total: 130 mins Prolonged services billed: Yes Greater than 50%  of this time was spent counseling and coordinating care related to the above assessment and plan.  Signed by: Mariana Kaufman, AGNP-C Palliative Medicine    Please contact Palliative Medicine Team phone at [phone] for questions and concerns.  For individual provider: See Shea Evans

## 2017-04-12 NOTE — Consult Note (Addendum)
   [facility] CM Inpatient Consult   04/12/2017  [name] 02-26-1935 [id number]    Spoke with inpatient RNCM. Discussed that [facility] Care Management is active and has been following [name]. Please see chart review then encounters for further patient outreach details.  Also discussed [facility] referral was placed recently.  Telephone call to [facility] at [phone] home based outpatient palliative program administered by [facility]. Spoke with Tammy who confirms she had an appointment scheduled with [name] on this Friday 04/14/17 for enrollment with [facility]. Made Tammy with [facility] aware that [name] is inpatient at [facility]. Discussed Palliative Medicine consult pending as well.   [facility] will follow up with [name] for their enrollment post discharge.  Spoke again with inpatient RNCM to make aware of conversation with [facility].   Discussed above with [facility] RNCM as well.    Raiford Noble, MSN-Ed, RN,BSN [facility] Liaison [phone]

## 2017-04-12 NOTE — Care Management Note (Addendum)
Case Management Note  Patient Details  Name: ANNELLA PROWELL MRN: [id number] Date of Birth: [birth year]  Subjective/Objective:      Pt readmit with COPD exacerbation              Action/Plan:  PTA from home with husband.  PCCM working with pt as outpt for BIPAP in the home however insurance approval has been the barrier.  CM consulted by attending for possible trilogy - [facility] requested to review pts case for insurance approval.  CM will continue to follow for discharge needs   Expected Discharge Date:                  Expected Discharge Plan:     In-House Referral:     Discharge planning Services  CM Consult  Post Acute Care Choice:    Choice offered to:     DME Arranged:    DME Agency:     HH Arranged:    HH Agency:     Status of Service:     If discussed at [employer], dates discussed:    Additional Comments: Pt is active with [facility], and has a referral in for  [facility] through [facility] (CC has not yet completed intake/acceptance process).  Trilogy equipment explained to pt, choice given and pt and husband chose [facility].  [facility] accepted referral for Trilogy and will provide equipment prior to discharge.  Attending to communicate with PCCM the move from potential BIPAP to triology.  Pt informed CM that she refused SNF during last admit however is active with [facility] for [facility] - will need [facility] RN resumption orders.  Pt is active with [facility] for home oxygen 3 liters - has portable tank for transport home Cherylann Parr, RN 04/12/2017, 10:25 AM

## 2017-04-12 NOTE — Progress Notes (Signed)
PROGRESS NOTE    [name]  ZHY:[id number] DOB: 06/29/1935 DOA: 04/11/2017 PCP: [name], MD   Brief Narrative:   82 y.o. WF PMHx Anxiety, Chronic Diastolic CHF, HTN, CAD, severe COPD-emphysema (on 2 L O2 at home), with prior hospitalizations for exacerbation, last from 8/28   through 03/14/2017, discharged on prednisone taper to 0,   Presenting to the [facility] EMS, as the patient was dyspneic, with pursed lip breathing, unable to speak due to shortness of breath. She was initially intolerant to BiPAP, so she had to be placed on Ventimask in route, and received 1 dose of albuterol. On arrival, the patient was given Solu-Medrol IV 125 mg, and continued albuterol was initiated, with some relief. She had a recent change in medications, including Atrovent nebulizer 3 times a day 24 times a day, and Prednisone had been decreased to 10 mg a day, from 20.  SHe also has been using Morphine sublingual prn for SOB . She is on oxygen at home 2 L continuous.Denies rhinorrhea or hemoptysis. Denies fevers, chills, night sweats or mucositis.Denies any chest pain, chest wall pain or palpitations. Denies any sick contacts Denies any abdominal pain. Has decreased appetite due to current symptoms. Denies nausea or vomiting. Denies dizziness or vertigo. Denies lower extremity swelling. No confusion was reported. Denies any vision changes, double vision or headaches.     Subjective: 10/3 A/O 4, positive acute on chronic respiratory distress breathing with pursed lips. Negative CP, negative abdominal pain, negative N/V. States Dr. Delton Coombes [facility] M) has been trying to obtain CPAP/BIPAP machine for patient but today he has not been successful. Patient last seen by [facility] M on 9/25. States has not had official sleep study but has had overnight oxygenation study showing she's dropped into the low 80s while on O2 at home. Confirms recently prednisone decreased from 20 mg daily-->10 mg daily     Assessment & Plan:     Active Problems:   EMPHYSEMA NEC   COPD (chronic obstructive pulmonary disease) (HCC)   Carotid artery disease (HCC)   HTN (hypertension)   Hyperlipidemia   Hypoxemia   Respiratory distress   Acute on chronic respiratory failure with hypoxia and hypercapnia (HCC)   Anxiety state   Hypertrophic cardiomyopathy (HCC)   Iron deficiency anemia   Shortness of breath   Acute on chronic respiratory failure with hypoxia (HCC)   Acute on Chronic Respiratory failure with Hypoxia and Hypercarbia -Multifactorial severe COPD, OSA,chronic diastolic CHF -patient's multiple readmissions most likely secondary to not having BiPAP available at home, which is contributing to her worsening respiratory status during the night. -PCXR negative for pneumonia -change Solu-Medrol 60 mg TID--> Prednisone 50 mg BID(would rapidly taper back to 20 mg and hold until seen by [facility] M is outpatient)  -Atrovent neb TID (home dose) -Dulera BID in place of home Symbicort -Titrate O2 to maintain SPO2 89 to 93% -Mucinex BID -continue current antibiotics(not fully convinced infection but given her extremely poor respiratory status will treat as COPD exacerbation and await sputum culture) -Morphine concentrate 2.5-5 mg sublingual q 2hr PRN SOB: Home dose -overnight oxygenation study (ONO) per [facility] M note positive for Hypoxia.Have contacted NCM Lelon Mast will attempt to set patient up with Trilogy machine until [facility] Obtain insurance clearance for BiPAP -BiPAP QHS, napping,PRN    CAD, - tachycardia, most likely secondary to acute on chronic respiratory care hypoxia. -Initial troponins negative: Initial troponin is negative   Hyperglycemia,  -likely steroid-induced -Hemoglobin A1c does not meet  guidelines for prediabetes/diabetes: 8/28 Hemoglobin A1c = 5.6  -Sensitive SSI     Essential Hypertension BP 120/80   Pulse 107   -Controlled -See CHF     Hyperlipidemia -Continue home statins     Chronic diastolic CHF.  HCM,   -baseline weight? -patient is appears fluid overloaded. -Echocardiogram 09/2015 LVEF= 65%.-Grade 1 diastolic dysfunction -strict in and out -Daily weight -Lasix 40 mg daily -Nebivolol 5 mg daily   CKD stage III (baseline Cr 1.29 )     Recent Labs Lab 04/11/17 0845 04/12/17 0221  CREATININE 1.29* 1.01*  -Back to baseline   Anemia of chronic disease - Hemoglobin on admission 12.3, stable   Anxiety -Continue home Klonopin, Desyrel qhs prn   GERD,  -no acute symptoms -Continue PPI   Goals of care -10/3 PALLIATIVE CARE: Patient and Husband like to speak with palliative care concerning short-term vs long-term Goals of care(available support). Hospice    DVT prophylaxis: lovenox Code Status: partial Family Communication: spoke with husband concerning plan of care Disposition Plan: TBD   Consultants:  None    Procedures/Significant Events:  10/2 PCXR: Extensive upper lobe bullous disease stable.-Negative edema/consolidation   I have personally reviewed and interpreted all radiology studies and my findings are as above.  VENTILATOR SETTINGS:    Cultures 10/3 respiratory virus panel pending 10/3 sputum pending 10/3 blood pending    Antimicrobials: Anti-infectives    Start     Stop   04/12/17 1200  levofloxacin (LEVAQUIN) IVPB 500 mg  Status:  Discontinued     04/12/17 0843   04/12/17 1200  levofloxacin (LEVAQUIN) IVPB 750 mg         04/11/17 1000  azithromycin (ZITHROMAX) 500 mg in dextrose 5 % 250 mL IVPB     04/11/17 1134       Devices    LINES / TUBES:      Continuous Infusions: . sodium chloride 125 mL/hr at 04/11/17 1001  . albuterol Stopped (04/11/17 1130)  . levofloxacin (LEVAQUIN) IV       Objective: Vitals:   04/12/17 0009 04/12/17 0348 04/12/17 0500 04/12/17 0716  BP:  (!) 107/55  121/66  Pulse:    78  Resp:    20  Temp: (!) 97.5 F (36.4 C) 97.9 F (36.6 C)  97.8 F (36.6 C)  TempSrc: Oral Oral  Oral  SpO2:     100%  Weight:   115 lb 8 oz (52.4 kg)   Height:        Intake/Output Summary (Last 24 hours) at 04/12/17 0732 Last data filed at 04/12/17 0400  Gross per 24 hour  Intake          2612.92 ml  Output                0 ml  Net          2612.92 ml   Filed Weights   04/11/17 0849 04/12/17 0500  Weight: 115 lb 4.8 oz (52.3 kg) 115 lb 8 oz (52.4 kg)    Examination:  General: A/O 4, positive acute on chronic respiratory distress ENT: Negative Runny nose, negative gingival bleeding, Neck:  Negative scars, masses, torticollis, lymphadenopathy, JVD Lungs: tachypnea, pursed lip breathing,diffuse poor air movement, positive mild expiratory wheezing, positive by basilar crackles Cardiovascular: tachycardic,Regular  rhythm without murmur gallop or rub normal S1 and S2 Abdomen: negative abdominal pain, nondistended, positive soft, bowel sounds, no rebound, no ascites, no appreciable mass Extremities: No significant cyanosis, clubbing, or  edema bilateral lower extremities Skin: Negative rashes, lesions, ulcers Psychiatric:  Negative depression, positive anxiety, negative fatigue, negative mania  Central nervous system:  Cranial nerves II through XII intact, tongue/uvula midline, all extremities muscle strength 5/5, sensation intact throughout, negative dysarthria, negative expressive aphasia, negative receptive aphasia.  .     Data Reviewed: Care during the described time interval was provided by me .  I have reviewed this patient's available data, including medical history, events of note, physical examination, and all test results as part of my evaluation.   CBC:  Recent Labs Lab 04/11/17 0845 04/12/17 0221  WBC 16.4* 9.3  NEUTROABS 11.7*  --   HGB 12.3 9.9*  HCT 38.0 29.1*  MCV 93.1 92.7  PLT 390 297   Basic Metabolic Panel:  Recent Labs Lab 04/11/17 0845 04/12/17 0221  NA 134* 135  K 3.2* 4.1  CL 87* 90*  CO2 30 34*  GLUCOSE 219* 136*  BUN 14 17  CREATININE 1.29* 1.01*   CALCIUM 9.4 8.9   GFR: Estimated Creatinine Clearance: 34.1 mL/min (A) (by C-G formula based on SCr of 1.01 mg/dL (H)). Liver Function Tests:  Recent Labs Lab 04/11/17 0845 04/12/17 0221  AST 26 15  ALT 22 18  ALKPHOS 59 41  BILITOT 0.7 0.7  PROT 7.0 5.2*  ALBUMIN 3.4* 2.6*   No results for input(s): LIPASE, AMYLASE in the last 168 hours. No results for input(s): AMMONIA in the last 168 hours. Coagulation Profile:  Recent Labs Lab 04/12/17 0221  INR 0.98   Cardiac Enzymes:  Recent Labs Lab 04/11/17 0845  TROPONINI 0.03*   BNP (last 3 results)  Recent Labs  07/15/16 1542 01/26/17 1137 03/21/17 1314  PROBNP 90 91 94.0   HbA1C: No results for input(s): HGBA1C in the last 72 hours. CBG:  Recent Labs Lab 04/12/17 0720  GLUCAP 130*   Lipid Profile: No results for input(s): CHOL, HDL, LDLCALC, TRIG, CHOLHDL, LDLDIRECT in the last 72 hours. Thyroid Function Tests: No results for input(s): TSH, T4TOTAL, FREET4, T3FREE, THYROIDAB in the last 72 hours. Anemia Panel: No results for input(s): VITAMINB12, FOLATE, FERRITIN, TIBC, IRON, RETICCTPCT in the last 72 hours. Urine analysis:    Component Value Date/Time   COLORURINE YELLOW 04/11/2017 1622   APPEARANCEUR CLEAR 04/11/2017 1622   LABSPEC 1.008 04/11/2017 1622   PHURINE 6.0 04/11/2017 1622   GLUCOSEU NEGATIVE 04/11/2017 1622   HGBUR NEGATIVE 04/11/2017 1622   BILIRUBINUR NEGATIVE 04/11/2017 1622   KETONESUR NEGATIVE 04/11/2017 1622   PROTEINUR NEGATIVE 04/11/2017 1622   UROBILINOGEN 0.2 10/16/2009 2023   NITRITE NEGATIVE 04/11/2017 1622   LEUKOCYTESUR NEGATIVE 04/11/2017 1622   Sepsis Labs: (procalcitonin:4,lacticidven:4)  ) Recent Results (from the past 240 hour(s))  MRSA PCR Screening     Status: None   Collection Time: 04/11/17  6:54 PM  Result Value Ref Range Status   MRSA by PCR NEGATIVE NEGATIVE Final    Comment:        The GeneXpert MRSA Assay (FDA approved for NASAL  specimens only), is one component of a comprehensive MRSA colonization surveillance program. It is not intended to diagnose MRSA infection nor to guide or monitor treatment for MRSA infections.          Radiology Studies: Dg Chest Port 1 View  Result Date: 04/11/2017 CLINICAL DATA:  Respiratory distress EXAM: PORTABLE CHEST 1 VIEW COMPARISON:  March 11, 2017 FINDINGS: There is extensive upper lobe bullous disease. Lungs are somewhat hyperexpanded. There are small pleural effusions  bilaterally. There is no edema or consolidation. The heart size is normal. Pulmonary vascularity reflects the underlying bullous disease and is stable. There is aortic atherosclerosis. No evident bone lesions. IMPRESSION: Extensive upper lobe bullous disease, stable. No edema or consolidation. Small pleural effusions bilaterally. Cardiac silhouette within normal limits. There is aortic atherosclerosis. Aortic Atherosclerosis (ICD10-I70.0). Electronically Signed   By: Bretta Bang III M.D.   On: 04/11/2017 09:07        Scheduled Meds: . aspirin EC  81 mg Oral Daily  . atorvastatin  20 mg Oral Daily  . clonazePAM  0.25 mg Oral BID  . enoxaparin (LOVENOX) injection  30 mg Subcutaneous Q24H  . furosemide  40 mg Oral Daily  . guaiFENesin  1,200 mg Oral BID  . insulin aspart  0-9 Units Subcutaneous TID WC  . ipratropium-albuterol  3 mL Nebulization Q4H  . methylPREDNISolone (SOLU-MEDROL) injection  60 mg Intravenous Q8H  . nebivolol  5 mg Oral Daily  . pantoprazole  40 mg Oral Daily  . tiotropium  18 mcg Inhalation Daily   Continuous Infusions: . sodium chloride 125 mL/hr at 04/11/17 1001  . albuterol Stopped (04/11/17 1130)  . levofloxacin (LEVAQUIN) IV       LOS: 1 day    Time spent: 40 minutes    Amere Bricco, Roselind Messier, MD [facility] Pager [phone]   If 7PM-7AM, please contact night-coverage [email or web address] Password [email or web address] 04/12/2017, 7:32 AM

## 2017-04-13 DIAGNOSIS — N183 Chronic kidney disease, stage 3 (moderate): Secondary | ICD-10-CM

## 2017-04-13 DIAGNOSIS — R739 Hyperglycemia, unspecified: Secondary | ICD-10-CM

## 2017-04-13 DIAGNOSIS — I10 Essential (primary) hypertension: Secondary | ICD-10-CM

## 2017-04-13 DIAGNOSIS — F419 Anxiety disorder, unspecified: Secondary | ICD-10-CM

## 2017-04-13 DIAGNOSIS — I2510 Atherosclerotic heart disease of native coronary artery without angina pectoris: Secondary | ICD-10-CM

## 2017-04-13 DIAGNOSIS — I5033 Acute on chronic diastolic (congestive) heart failure: Secondary | ICD-10-CM

## 2017-04-13 DIAGNOSIS — D638 Anemia in other chronic diseases classified elsewhere: Secondary | ICD-10-CM

## 2017-04-13 LAB — GLUCOSE, CAPILLARY
GLUCOSE-CAPILLARY: 108 mg/dL — AB (ref 65–99)
Glucose-Capillary: 158 mg/dL — ABNORMAL HIGH (ref 65–99)
Glucose-Capillary: 136 mg/dL — ABNORMAL HIGH (ref 65–99)
Glucose-Capillary: 171 mg/dL — ABNORMAL HIGH (ref 65–99)

## 2017-04-13 LAB — IRON AND TIBC
IRON: 43 ug/dL (ref 28–170)
Saturation Ratios: 14 % (ref 10.4–31.8)
TIBC: 301 ug/dL (ref 250–450)
UIBC: 258 ug/dL

## 2017-04-13 LAB — VITAMIN B12: Vitamin B-12: 489 pg/mL (ref 180–914)

## 2017-04-13 LAB — RESPIRATORY PANEL BY PCR
ADENOVIRUS-RVPPCR: NOT DETECTED
Bordetella pertussis: NOT DETECTED
CHLAMYDOPHILA PNEUMONIAE-RVPPCR: NOT DETECTED
CORONAVIRUS HKU1-RVPPCR: NOT DETECTED
CORONAVIRUS NL63-RVPPCR: NOT DETECTED
Coronavirus 229E: NOT DETECTED
Coronavirus OC43: NOT DETECTED
INFLUENZA A-RVPPCR: NOT DETECTED
Influenza B: NOT DETECTED
MYCOPLASMA PNEUMONIAE-RVPPCR: NOT DETECTED
Metapneumovirus: NOT DETECTED
PARAINFLUENZA VIRUS 4-RVPPCR: NOT DETECTED
Parainfluenza Virus 1: NOT DETECTED
Parainfluenza Virus 2: NOT DETECTED
Parainfluenza Virus 3: NOT DETECTED
Respiratory Syncytial Virus: NOT DETECTED
Rhinovirus / Enterovirus: NOT DETECTED

## 2017-04-13 LAB — BASIC METABOLIC PANEL
ANION GAP: 10 (ref 5–15)
BUN: 22 mg/dL — ABNORMAL HIGH (ref 6–20)
CO2: 34 mmol/L — ABNORMAL HIGH (ref 22–32)
Calcium: 9.1 mg/dL (ref 8.9–10.3)
Chloride: 92 mmol/L — ABNORMAL LOW (ref 101–111)
Creatinine, Ser: 1.1 mg/dL — ABNORMAL HIGH (ref 0.44–1.00)
GFR calc Af Amer: 54 mL/min — ABNORMAL LOW (ref >60)
GFR, EST NON AFRICAN AMERICAN: 46 mL/min — AB (ref >60)
Glucose, Bld: 129 mg/dL — ABNORMAL HIGH (ref 65–99)
POTASSIUM: 3.5 mmol/L (ref 3.5–5.1)
SODIUM: 136 mmol/L (ref 135–145)

## 2017-04-13 LAB — RETICULOCYTES
RBC.: 3.45 MIL/uL — ABNORMAL LOW (ref 3.87–5.11)
RETIC COUNT ABSOLUTE: 48.3 10*3/uL (ref 19.0–186.0)
Retic Ct Pct: 1.4 % (ref 0.4–3.1)

## 2017-04-13 LAB — FERRITIN: Ferritin: 93 ng/mL (ref 11–307)

## 2017-04-13 LAB — FOLATE: FOLATE: 26 ng/mL (ref >5.9)

## 2017-04-13 LAB — MAGNESIUM: MAGNESIUM: 2 mg/dL (ref 1.7–2.4)

## 2017-04-13 MED ORDER — LORAZEPAM 0.5 MG PO TABS
0.5000 mg | ORAL_TABLET | Freq: Four times a day (QID) | ORAL | Status: DC | PRN
  Administered 2017-04-14 – 2017-04-15 (×3): 0.5 mg via ORAL
  Filled 2017-04-13: qty 2
  Filled 2017-04-13 (×2): qty 1

## 2017-04-13 MED ORDER — FUROSEMIDE 40 MG PO TABS
40.0000 mg | ORAL_TABLET | Freq: Three times a day (TID) | ORAL | Status: DC
  Administered 2017-04-13 – 2017-04-15 (×7): 40 mg via ORAL
  Filled 2017-04-13 (×7): qty 1

## 2017-04-13 MED ORDER — IPRATROPIUM BROMIDE 0.02 % IN SOLN
0.5000 mg | Freq: Three times a day (TID) | RESPIRATORY_TRACT | Status: DC
  Administered 2017-04-13 – 2017-04-14 (×4): 0.5 mg via RESPIRATORY_TRACT
  Filled 2017-04-13 (×5): qty 2.5

## 2017-04-13 NOTE — Progress Notes (Signed)
Pt refused to used Bipap. RT will continue to monitor.

## 2017-04-13 NOTE — Progress Notes (Signed)
Dr Clearence Ped is notified of no IV access.

## 2017-04-13 NOTE — Progress Notes (Signed)
Wasted 7.4mg  of sl morphine in med room with [employer]. Pyxis wouldn't allow [place] to put in the proper amount wasted.

## 2017-04-13 NOTE — Progress Notes (Signed)
PROGRESS NOTE    [name]  WUJ:[id number] DOB: 17-Oct-1934 DOA: 04/11/2017 PCP: [name], MD   Brief Narrative:  82 y.o. WF PMHx Anxiety, Chronic Diastolic CHF, HTN, CAD, severe COPD-emphysema (on 2 L O2 at home), with prior hospitalizations for exacerbation, last from 8/28   through 03/14/2017, discharged on prednisone taper to 0,    Presenting to the [facility] EMS, as the patient was dyspneic, with pursed lip breathing, unable to speak due to shortness of breath. She was initially intolerant to BiPAP, so she had to be placed on Ventimask in route, and received 1 dose of albuterol. On arrival, the patient was given Solu-Medrol IV 125 mg, and continued albuterol was initiated, with some relief. She had a recent change in medications, including Atrovent nebulizer 3 times a day 24 times a day, and Prednisone had been decreased to 10 mg a day, from 20.  SHe also has been using Morphine sublingual prn for SOB . She is on oxygen at home 2 L continuous.Denies rhinorrhea or hemoptysis. Denies fevers, chills, night sweats or mucositis.Denies any chest pain, chest wall pain or palpitations. Denies any sick contacts Denies any abdominal pain. Has decreased appetite due to current symptoms. Denies nausea or vomiting. Denies dizziness or vertigo. Denies lower extremity swelling. No confusion was reported. Denies any vision changes, double vision or headaches.     Subjective: 10/4 A/O 4, positive acute on chronic respiratory distress. Breathing with pursed lips. States very uncomfortable. Spoke length concerning use of BiPAP/Trilogy patient does not believe she is able to use these devices secondary to claustrophobia. Negative CP, negative abdominal pain, negative N/V.     Assessment & Plan:   Active Problems:   EMPHYSEMA NEC   COPD (chronic obstructive pulmonary disease) (HCC)   Carotid artery disease (HCC)   HTN (hypertension)   Hyperlipidemia   Hypoxemia   Respiratory distress   Acute on  chronic respiratory failure with hypoxia and hypercapnia (HCC)   Anxiety state   Hypertrophic cardiomyopathy (HCC)   Iron deficiency anemia   Shortness of breath   Acute on chronic respiratory failure with hypoxia (HCC)   Advance care planning   Acute on Chronic Respiratory failure with Hypoxia and Hypercarbia -Multifactorial: Severe COPD, OSA, chronic diastolic CHF.Multifactorial severe COPD, OSA,chronic diastolic CHF -patient's multiple readmissions most likely secondary to not having BiPAP available at home, which is contributing to her worsening respiratory status during the night. -PCXR negative for pneumonia -change Solu-Medrol 60 mg TID--> Prednisone 50 mg BID(would rapidly taper back to 20 mg and hold until seen by [facility] M is outpatient)  -Atrovent neb TID (home dose) -Dulera BID in place of home Symbicort -Titrate O2 to maintain SPO2 89 to 93% -Mucinex BID -continue current antibiotics(not fully convinced infection but given her extremely poor respiratory status will treat as COPD exacerbation and await sputum culture) -Morphine concentrate 2.5-5 mg sublingual q 2hr PRN SOB: Home dose -overnight oxygenation study (ONO) per [facility] M note positive for Hypoxia.Have contacted NCM Lelon Mast will attempt to set patient up with Trilogy machine until [facility] M Obtain insurance clearance for BiPAP -BiPAP QHS, napping,PRN     CAD, - tachycardia, most likely secondary to acute on chronic respiratory care hypoxia. -Initial troponins negative: Initial troponin is negative   Hyperglycemia,  -likely steroid-induced -Hemoglobin A1c does not meet guidelines for prediabetes/diabetes: 8/28 Hemoglobin A1c = 5.6  -Sensitive SSI     Essential Hypertension BP 120/80   Pulse 107   -Controlled -See CHF  Hyperlipidemia -Continue home statins     Chronic diastolic CHF. HCM,   -baseline weight?(per husband prior illness weight~ 124 pounds) -patient does not appear fluid  overloaded. -Echocardiogram 09/2015 LVEF= 65%.-Grade 1 diastolic dysfunction -strict in and outsince admission +2.2 L -Daily weight Filed Weights   04/11/17 0849 04/12/17 0500 04/13/17 0500  Weight: 115 lb 4.8 oz (52.3 kg) 115 lb 8 oz (52.4 kg) 118 lb 14.4 oz (53.9 kg)  -10/4 increase Lasix 40 mg TID : Slightly fluid positive will see if additional diuretic helps with respiratory status. Not hopeful. -Nebivolol 5 mg daily   CKD stage III (baseline Cr 1.29   Recent Labs Lab 04/11/17 0845 04/12/17 0221  CREATININE 1.29* 1.01*  -Back to baseline   Anemia of chronic disease - Hemoglobin on admission 12.3, stable   Anxiety -clonazepam 0.25 mg TID -10/4 start Ativan PRN . Patient like to try BiPAP one more time. (Claustrophobia) -trazodone 25 mg QHS    GERD,  -no acute symptoms -Continue PPI   Goals of care -10/3 PALLIATIVE CARE: Patient and Husband like to speak with palliative care concerning short-term vs long-term Goals of care(available support). Hospice    DVT prophylaxis: Lovenox Code Status: partial Family Communication: spoke with husband at length concerning plan of care. Beginning to lean toward hospice. Disposition Plan: TBD. Husband and children to meet with palliative care again today discuss home hospice vs inpatient hospice   Consultants:  PALLIATIVE CARE   Procedures/Significant Events:  10/2 PCXR: Extensive upper lobe just disease stable.-Negative edema/consolidation 10/3 PCXR: Unchanged from 10/2    I have personally reviewed and interpreted all radiology studies and my findings are as above.  VENTILATOR SETTINGS:    Cultures 10/3 respiratory virus panel pending 10/3 sputum pending 10/3 blood pending   Antimicrobials: Anti-infectives    Start     Dose/Rate Stop   04/12/17 1200  levofloxacin (LEVAQUIN) IVPB 500 mg  Status:  Discontinued     500 mg 100 mL/hr over 60 Minutes 04/12/17 0843   04/12/17 1200  levofloxacin (LEVAQUIN) IVPB 750  mg     750 mg 100 mL/hr over 90 Minutes     04/11/17 1000  azithromycin (ZITHROMAX) 500 mg in dextrose 5 % 250 mL IVPB     500 mg 250 mL/hr over 60 Minutes 04/11/17 1134       Devices    LINES / TUBES:      Continuous Infusions: . albuterol Stopped (04/11/17 1130)  . levofloxacin (LEVAQUIN) IV Stopped (04/12/17 1330)     Objective: Vitals:   04/13/17 0100 04/13/17 0339 04/13/17 0500 04/13/17 0842  BP:  114/81    Pulse: 66 73    Resp: 14 19    Temp:  97.6 F (36.4 C)  98.6 F (37 C)  TempSrc:  Oral  Oral  SpO2: 100% 100%    Weight:   118 lb 14.4 oz (53.9 kg)   Height:        Intake/Output Summary (Last 24 hours) at 04/13/17 0905 Last data filed at 04/12/17 2020  Gross per 24 hour  Intake              730 ml  Output              750 ml  Net              -20 ml   Filed Weights   04/11/17 0849 04/12/17 0500 04/13/17 0500  Weight: 115 lb 4.8 oz (52.3 kg) 115  lb 8 oz (52.4 kg) 118 lb 14.4 oz (53.9 kg)    Examination:  General: A/O 4, positive acute on chronic respiratory distress, uncomfortable ENT: Negative Runny nose, negative gingival bleeding, Neck:  Negative scars, masses, torticollis, lymphadenopathy, JVD Lungs: tachypnea, pursed lip breathing,diffuse poor air movement, positive mild expiratory wheezing,negative crackles. Cardiovascular: tachycardic,Regular  rhythm without murmur gallop or rub normal S1 and S2 Abdomen: negative abdominal pain, nondistended, positive soft, bowel sounds, no rebound, no ascites, no appreciable mass Extremities: No significant cyanosis, clubbing, or edema bilateral lower extremities Skin: Negative rashes, lesions, ulcers Psychiatric:  Negative depression, positive anxiety, negative fatigue, negative mania  Central nervous system:  Cranial nerves II through XII intact, tongue/uvula midline, all extremities muscle strength 5/5, sensation intact throughout, negative dysarthria, negative expressive aphasia, negative receptive  aphasia.  .     Data Reviewed: Care during the described time interval was provided by me .  I have reviewed this patient's available data, including medical history, events of note, physical examination, and all test results as part of my evaluation.   CBC:  Recent Labs Lab 04/11/17 0845 04/12/17 0221  WBC 16.4* 9.3  NEUTROABS 11.7*  --   HGB 12.3 9.9*  HCT 38.0 29.1*  MCV 93.1 92.7  PLT 390 297   Basic Metabolic Panel:  Recent Labs Lab 04/11/17 0845 04/12/17 0221  NA 134* 135  K 3.2* 4.1  CL 87* 90*  CO2 30 34*  GLUCOSE 219* 136*  BUN 14 17  CREATININE 1.29* 1.01*  CALCIUM 9.4 8.9   GFR: Estimated Creatinine Clearance: 34.1 mL/min (A) (by C-G formula based on SCr of 1.01 mg/dL (H)). Liver Function Tests:  Recent Labs Lab 04/11/17 0845 04/12/17 0221  AST 26 15  ALT 22 18  ALKPHOS 59 41  BILITOT 0.7 0.7  PROT 7.0 5.2*  ALBUMIN 3.4* 2.6*   No results for input(s): LIPASE, AMYLASE in the last 168 hours. No results for input(s): AMMONIA in the last 168 hours. Coagulation Profile:  Recent Labs Lab 04/12/17 0221  INR 0.98   Cardiac Enzymes:  Recent Labs Lab 04/11/17 0845  TROPONINI 0.03*   BNP (last 3 results)  Recent Labs  07/15/16 1542 01/26/17 1137 03/21/17 1314  PROBNP 90 91 94.0   HbA1C: No results for input(s): HGBA1C in the last 72 hours. CBG:  Recent Labs Lab 04/12/17 0720 04/12/17 1249 04/12/17 1724 04/12/17 2134 04/13/17 0819  GLUCAP 130* 143* 84 128* 108*   Lipid Profile: No results for input(s): CHOL, HDL, LDLCALC, TRIG, CHOLHDL, LDLDIRECT in the last 72 hours. Thyroid Function Tests: No results for input(s): TSH, T4TOTAL, FREET4, T3FREE, THYROIDAB in the last 72 hours. Anemia Panel: No results for input(s): VITAMINB12, FOLATE, FERRITIN, TIBC, IRON, RETICCTPCT in the last 72 hours. Urine analysis:    Component Value Date/Time   COLORURINE YELLOW 04/11/2017 1622   APPEARANCEUR CLEAR 04/11/2017 1622   LABSPEC  1.008 04/11/2017 1622   PHURINE 6.0 04/11/2017 1622   GLUCOSEU NEGATIVE 04/11/2017 1622   HGBUR NEGATIVE 04/11/2017 1622   BILIRUBINUR NEGATIVE 04/11/2017 1622   KETONESUR NEGATIVE 04/11/2017 1622   PROTEINUR NEGATIVE 04/11/2017 1622   UROBILINOGEN 0.2 10/16/2009 2023   NITRITE NEGATIVE 04/11/2017 1622   LEUKOCYTESUR NEGATIVE 04/11/2017 1622   Sepsis Labs: (procalcitonin:4,lacticidven:4)  ) Recent Results (from the past 240 hour(s))  MRSA PCR Screening     Status: None   Collection Time: 04/11/17  6:54 PM  Result Value Ref Range Status   MRSA by PCR NEGATIVE NEGATIVE  Final    Comment:        The GeneXpert MRSA Assay (FDA approved for NASAL specimens only), is one component of a comprehensive MRSA colonization surveillance program. It is not intended to diagnose MRSA infection nor to guide or monitor treatment for MRSA infections.          Radiology Studies: Dg Chest Port 1 View  Result Date: 04/12/2017 CLINICAL DATA:  COPD exacerbation. EXAM: PORTABLE CHEST 1 VIEW COMPARISON:  04/11/2017 and previous FINDINGS: Heart size is normal. Aortic atherosclerosis again noted. Upper lobe predominant emphysema and pulmonary scarring as seen previously. Question the presence of small subpulmonic effusions, at least on the right. No acute bone finding. IMPRESSION: Chronic lung disease with emphysema and pulmonary scarring. Question small subpulmonic effusions, at least on the right. Electronically Signed   By: Paulina Fusi M.D.   On: 04/12/2017 10:32        Scheduled Meds: . aspirin EC  81 mg Oral Daily  . atorvastatin  20 mg Oral Daily  . clonazePAM  0.25 mg Oral TID  . enoxaparin (LOVENOX) injection  30 mg Subcutaneous Q24H  . furosemide  40 mg Oral Daily  . guaiFENesin  1,200 mg Oral BID  . insulin aspart  0-9 Units Subcutaneous TID WC  . ipratropium  0.5 mg Nebulization TID  . mometasone-formoterol  2 puff Inhalation BID  . morphine  15 mg Oral Q12H  .  nebivolol  5 mg Oral Daily  . pantoprazole  40 mg Oral Daily  . polyethylene glycol  17 g Oral Daily  . predniSONE  50 mg Oral BID WC  . senna-docusate  2 tablet Oral BID   Continuous Infusions: . albuterol Stopped (04/11/17 1130)  . levofloxacin (LEVAQUIN) IV Stopped (04/12/17 1330)     LOS: 2 days    Time spent: 40 minutes    WOODS, Roselind Messier, MD [facility] Pager [phone]   If 7PM-7AM, please contact night-coverage [email or web address] Password [email or web address] 04/13/2017, 9:05 AM

## 2017-04-13 NOTE — Progress Notes (Signed)
Daily Progress Note   Patient Name: [name]       Date: 04/13/2017 DOB: [birth year]  Age: 82 y.o. MRN#: [id number] Attending Physician: Drema Dallas, MD Primary Care Physician: [name], MD Admit Date: 04/11/2017  Reason for Consultation/Follow-up: Establishing goals of care and Non pain symptom management  Subjective: Patient in bed. Much more comfortable than yesterday. Feels increased and long acting morphine is helping her symptoms. She trialed bipap for a few hours today and tolerated it. She wants to continue to use bipap. Reviewed use of prn medications. Encouraged her to use prn Ativan. Discussed nonverbal signs of discomfort with her spouse and encouraged him to advocate and request prn medications for her when needed. Patient feels nebulizer treatments aren't helping- she feels they make her worse. We discussed this could be due to anxiety.  Again discussed difference between continued aggressive medical care, hospitalizations vs comfort care. Patient expresses readiness for comfort care only, however, spouse is reluctant. Discussed her fears (suffering) and hopes (that she's done well for her children). Plan for full family meeting tomorrow re: long term disposition.  ROS  Length of Stay: 2  Current Medications: Scheduled Meds:  . aspirin EC  81 mg Oral Daily  . atorvastatin  20 mg Oral Daily  . clonazePAM  0.25 mg Oral TID  . enoxaparin (LOVENOX) injection  30 mg Subcutaneous Q24H  . furosemide  40 mg Oral TID  . guaiFENesin  1,200 mg Oral BID  . insulin aspart  0-9 Units Subcutaneous TID WC  . ipratropium  0.5 mg Nebulization TID  . mometasone-formoterol  2 puff Inhalation BID  . morphine  15 mg Oral Q12H  . nebivolol  5 mg Oral Daily  . pantoprazole  40 mg  Oral Daily  . polyethylene glycol  17 g Oral Daily  . predniSONE  50 mg Oral BID WC  . senna-docusate  2 tablet Oral BID    Continuous Infusions: . levofloxacin (LEVAQUIN) IV Stopped (04/12/17 1330)    PRN Meds: acetaminophen **OR** acetaminophen, aspirin-acetaminophen-caffeine, bisacodyl, LORazepam, morphine CONCENTRATE, ondansetron **OR** ondansetron (ZOFRAN) IV, traZODone  Physical Exam  Constitutional: She is oriented to person, place, and time.  frail  Cardiovascular: Normal rate and regular rhythm.   Pulmonary/Chest: She has wheezes.  Rate increased, pursed lip breathing at times  Neurological: She is alert and oriented to person, place, and time.  Skin: There is pallor.  Psychiatric: She has a normal mood and affect. Her behavior is normal. Thought content normal.  Nursing note and vitals reviewed.           Vital Signs: BP (!) 148/73 (BP Location: Right Arm)   Pulse 77   Temp 97.7 F (36.5 C) (Oral)   Resp 19   Ht  (1.549 m)   Wt 53.9 kg (118 lb 14.4 oz)   LMP  (LMP Unknown)   SpO2 98%   BMI 22.47 kg/m  SpO2: SpO2: 98 % O2 Device: O2 Device: Nasal Cannula O2 Flow Rate: O2 Flow Rate (L/min): 3 L/min  Intake/output summary:  Intake/Output Summary (Last 24 hours) at 04/13/17 1613 Last data filed at 04/12/17 2020  Gross per 24 hour  Intake              340 ml  Output              750 ml  Net             -410 ml   LBM: Last BM Date: 04/11/17 Baseline Weight: Weight: 52.3 kg (115 lb 4.8 oz) Most recent weight: Weight: 53.9 kg (118 lb 14.4 oz)       Palliative Assessment/Data: PPS: 40%      Patient Active Problem List   Diagnosis Date Noted  . Advance care planning   . Acute on chronic respiratory failure with hypoxia (HCC) 04/11/2017  . Oral candidiasis 04/04/2017  . SOB (shortness of breath)   . Palliative care encounter   . Goals of care, counseling/discussion   . Palliative care by specialist   . Chronic respiratory failure with hypoxia  and hypercapnia (HCC) 03/07/2017  . Hyperglycemia 03/07/2017  . Shortness of breath 07/29/2016  . Acute diastolic heart failure (HCC) 01/27/2016  . Acute on chronic diastolic CHF (congestive heart failure), NYHA class 3 (HCC) 01/27/2016  . DOE (dyspnea on exertion) 01/27/2016  . Hypertensive heart disease with CHF (congestive heart failure) (HCC) 01/27/2016  . Diastolic dysfunction 11/02/2015  . CAD in native artery   . HLD (hyperlipidemia)   . Anxiety state   . Hypertrophic cardiomyopathy (HCC)   . Malnutrition of moderate degree 09/14/2015  . Elevated troponin   . Respiratory distress 09/09/2015  . Acute on chronic respiratory failure with hypoxia and hypercapnia (HCC) 09/09/2015  . Sepsis (HCC) 09/09/2015  . Leukocytosis   . Elevated troponin I measurement   . CAD (coronary artery disease), native coronary artery 01/17/2013  . Carotid stenosis 05/09/2012  . Dynamic left ventricular outflow obstruction 04/19/2012  . Multiple lung nodules 04/19/2012  . Hyperlipidemia 04/16/2012  . Hypoxemia 04/16/2012  . HTN (hypertension) 07/08/2011  . Carotid artery disease (HCC) 03/03/2011  . Occlusion and stenosis of carotid artery without mention of cerebral infarction 02/14/2011  . COPD (chronic obstructive pulmonary disease) (HCC) 06/15/2010  . DYSPNEA 06/15/2010  . Osteoporosis 01/29/2010  . PVD 09/29/2008  . Aortic aneurysm (HCC) 03/10/2008  . Phlebitis and thrombophlebitis of superficial veins of upper extremities 10/17/2007  . 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 Iron deficiency anemia 09/08/2007  . Tachycardia 09/08/2007  . Embolism and thrombosis of other specified artery(444.89) 09/08/2007  . Anxiety 01/09/2007  . CANDIDIASIS, VAGINAL 12/12/2006  . ACTINOMYCOSIS, CERVICOFACIAL 12/01/2006  . EMPHYSEMA NEC 12/01/2006    Palliative Care Assessment & Plan   Patient Profile: 82 y.o. female  with past medical history of severe COPD with hypoxic and hypercarbic  respiratory failure, diastolic CHF, anemia, anxiety, CAD, HTN, admitted on 04/11/2017 with severe SOB- workup reveals acute on chronic respiratory failure with hypoxia secondary to COPD. This is her second admission in the last six months. Most recently she was discharged home on 03/15/2017 with plans for outpatient Palliative follow up. This was not arranged.    Assessment/Recommendations/Plan   Family meeting scheduled for tomorrow at 1500  Patient and family will likely benefit from Hospice services at home  Patient agrees to trial bipap and would like to proceed with attempts to obtain trilogy machine for home use and wearing it for a few hours today did provide her with some comfort  Recommend providing prn Ativan alternating with prn morphine for SOB  Code Status:  Limited code bipap ok  Prognosis:   < 3 months d/t advanced COPD-emphysema   Discharge Planning:  To Be Determined likely home with hospice vs palliative  Care plan was discussed with patient and spouse.   Thank you for allowing the Palliative Medicine Team to assist in the care of this patient.   Time In: 1300 Time Out: 1400 Total Time 60 mins Prolonged Time Billed No      Greater than 50%  of this time was spent counseling and coordinating care related to the above assessment and plan.  Ocie Bob, AGNP-C Palliative Medicine   Please contact Palliative Medicine Team phone at [phone] for questions and concerns.

## 2017-04-13 NOTE — Progress Notes (Signed)
Patient does not have IV access. Called IV team and she is on the list for an IV insertion.

## 2017-04-13 NOTE — Care Management (Signed)
Pt refused BIPAP overnight.  CM met with pt and husband bedside - pt is noticeable distress with purse lip breathing.  Pts husband informed CM that pt is claustrophobic and that is the barrier for anything with a mask.  CM educated pt and husband that the mask is a part of the DME recommended.  CM spoke with both attending and Palliative and relayed the barrier for the pt with the recommended equipment.  CM also informed both that the [facility] program has not yet accepted pt and pt would be better served with home with hospice program (agency not yet determined)  if returning home.

## 2017-04-14 DIAGNOSIS — R0902 Hypoxemia: Secondary | ICD-10-CM

## 2017-04-14 DIAGNOSIS — D509 Iron deficiency anemia, unspecified: Secondary | ICD-10-CM

## 2017-04-14 DIAGNOSIS — J438 Other emphysema: Secondary | ICD-10-CM

## 2017-04-14 DIAGNOSIS — R0989 Other specified symptoms and signs involving the circulatory and respiratory systems: Secondary | ICD-10-CM

## 2017-04-14 DIAGNOSIS — R0602 Shortness of breath: Secondary | ICD-10-CM

## 2017-04-14 DIAGNOSIS — L89621 Pressure ulcer of left heel, stage 1: Secondary | ICD-10-CM

## 2017-04-14 DIAGNOSIS — L8990 Pressure ulcer of unspecified site, unspecified stage: Secondary | ICD-10-CM | POA: Diagnosis present

## 2017-04-14 DIAGNOSIS — R0603 Acute respiratory distress: Secondary | ICD-10-CM

## 2017-04-14 DIAGNOSIS — R0609 Other forms of dyspnea: Secondary | ICD-10-CM

## 2017-04-14 DIAGNOSIS — J441 Chronic obstructive pulmonary disease with (acute) exacerbation: Principal | ICD-10-CM

## 2017-04-14 DIAGNOSIS — J9620 Acute and chronic respiratory failure, unspecified whether with hypoxia or hypercapnia: Secondary | ICD-10-CM

## 2017-04-14 LAB — GLUCOSE, CAPILLARY
GLUCOSE-CAPILLARY: 128 mg/dL — AB (ref 65–99)
GLUCOSE-CAPILLARY: 137 mg/dL — AB (ref 65–99)
Glucose-Capillary: 103 mg/dL — ABNORMAL HIGH (ref 65–99)
Glucose-Capillary: 121 mg/dL — ABNORMAL HIGH (ref 65–99)

## 2017-04-14 MED ORDER — MORPHINE SULFATE ER 15 MG PO TBCR
30.0000 mg | EXTENDED_RELEASE_TABLET | Freq: Two times a day (BID) | ORAL | Status: DC
  Administered 2017-04-14 – 2017-04-15 (×2): 30 mg via ORAL
  Filled 2017-04-14 (×2): qty 2

## 2017-04-14 MED ORDER — ENOXAPARIN SODIUM 40 MG/0.4ML ~~LOC~~ SOLN
40.0000 mg | SUBCUTANEOUS | Status: DC
  Administered 2017-04-14: 40 mg via SUBCUTANEOUS
  Filled 2017-04-14: qty 0.4

## 2017-04-14 MED ORDER — LACTULOSE 10 GM/15ML PO SOLN
10.0000 g | ORAL | Status: AC
  Administered 2017-04-14: 10 g via ORAL
  Filled 2017-04-14: qty 15

## 2017-04-14 MED ORDER — LEVOFLOXACIN 750 MG PO TABS
750.0000 mg | ORAL_TABLET | ORAL | Status: DC
  Administered 2017-04-14: 750 mg via ORAL
  Filled 2017-04-14: qty 1

## 2017-04-14 NOTE — Progress Notes (Signed)
[facility]--RN Visit  Notified by Doristine Church of patient/family request for [facility] services at home after discharge. Chart and patient information under review by [facility] physician. Hospice eligibility pending at this time.  Spoke with patient at bedside to initiate education related to hospice philosophy, services and team approach to care. Patient verbalized understanding of information given. Per discussion, plan is for discharge to home by [facility] possibly tomorrow.  DME needs discussed, patient currently has home O2 with [facility], bedside commode and shower chair. She is requesting a hospital bed and over the bed table. Cole notified with [facility] to arrange delivery to the home. Home address was verified and is correct in the chart. Leonette Most (pt's husband) is the family member to contact to arrange time of delivery.  [facility] is aware of the above. Completed discharge summary will need to be faxed to [phone] when final. Please notify [facility] when patient is ready to leave the unit discharge at [phone]. [facility] information and contact numbers given to patient during this visit. Above information shared with Lelon Mast, RNCM.  Please send signed and completed DNR for home with patient/family. Patient will need prescriptions for discharge comfort medications.  Please call with any hospice related questions or concern.  Thank you for this referral.  Haynes Bast, RN [facility] Liaison [phone]  [facility] Liaisons are on AMION.

## 2017-04-14 NOTE — Care Management (Addendum)
Palliative met with pt and family at bedside - pt has decided to discharge home with hospice.  CM provided agency choice and [facility] was chosen.  CM contacted agency and provided referral to Amy at 1620.  Per Palliative NP; pt will discharge home on 4 L [place] - pt will not need/require BIPAP nor Triology in the home.  Pts family will provide 24 hour supervision and understand that hospice will not provide 24/7 care.  Pt will transport home via ambulance - transportation arrangements will need to be made by unit staff once discharged - CM communicated this with bedside nurse.  Pt request hospital bed and oxygen in the home to be furnished by [facility].  Hospice liaison to follow up directly with pt/family

## 2017-04-14 NOTE — Progress Notes (Signed)
Daily Progress Note   Patient Name: [name]       Date: 04/14/2017 DOB: 1934-11-15  Age: 82 y.o. MRN#: [id number] Attending Physician: Tonye Royalty, MD Primary Care Physician: Velna Hatchet, MD Admit Date: 04/11/2017  Reason for Consultation/Follow-up: Establishing goals of care, Non pain symptom management and Psychosocial/spiritual support  Subjective: Met with patient, spouse and four children. Reviewed patient's status and GOC. All verbalized GOC is to decrease suffering. Family understands patient is at end stage of progressive lung disease with limited life time. Primary goal is for patient to return home with Hospice support, transition care to mostly comfort, stop pursuing bipap as this is not providing patient comfort. Reviewed symptom management. Patient received prn lorazepam with prn morphine while I was at bedside and this eased her breathlessness some. Per nursing nebulizers worsen SOB. She continues to be constipated. She is eating bites, drinking a good amount of fluids.   Review of Systems  Constitutional: Positive for malaise/fatigue.  Respiratory: Positive for shortness of breath and wheezing.   Cardiovascular: Negative for chest pain, palpitations and leg swelling.  Neurological: Positive for weakness.  Psychiatric/Behavioral: Negative for depression. The patient is nervous/anxious.     Length of Stay: 3  Current Medications: Scheduled Meds:  . clonazePAM  0.25 mg Oral TID  . enoxaparin (LOVENOX) injection  40 mg Subcutaneous Q24H  . furosemide  40 mg Oral TID  . guaiFENesin  1,200 mg Oral BID  . insulin aspart  0-9 Units Subcutaneous TID WC  . ipratropium  0.5 mg Nebulization TID  . lactulose  10 g Oral NOW  . levofloxacin  750 mg Oral Q48H  .  mometasone-formoterol  2 puff Inhalation BID  . morphine  15 mg Oral Q12H  . nebivolol  5 mg Oral Daily  . pantoprazole  40 mg Oral Daily  . polyethylene glycol  17 g Oral Daily  . predniSONE  50 mg Oral BID WC  . senna-docusate  2 tablet Oral BID    Continuous Infusions:   PRN Meds: acetaminophen **OR** acetaminophen, aspirin-acetaminophen-caffeine, bisacodyl, LORazepam, morphine CONCENTRATE, ondansetron **OR** ondansetron (ZOFRAN) IV, traZODone  Physical Exam  Constitutional:  frail  Pulmonary/Chest: She has wheezes.  Pursed lip breathing  Abdominal: Soft. Bowel sounds are normal. She exhibits distension. There is no tenderness.  Neurological:  Awake, sleepy  Skin: Skin is warm and dry. There is pallor.  Psychiatric: She has a normal mood and affect. Her behavior is normal. Judgment and thought content normal.  Nursing note and vitals reviewed.           Vital Signs: BP (!) 151/93   Pulse 70   Temp 98 F (36.7 C) (Oral)   Resp 18   Ht _0  (1.549 m)   Wt 59.1 kg (130 lb 3.2 oz)   LMP  (LMP Unknown)   SpO2 97%   BMI 24.60 kg/m  SpO2: SpO2: 97 % O2 Device: O2 Device: Nasal Cannula O2 Flow Rate: O2 Flow Rate (L/min): 2 L/min  Intake/output summary:  Intake/Output Summary (Last 24 hours) at 04/14/17 1609 Last data filed at 04/14/17 0830  Gross per 24 hour  Intake                0 ml  Output              250 ml  Net             -250 ml   LBM: Last BM Date: 04/11/17 Baseline Weight: Weight: 52.3 kg (115 lb 4.8 oz) Most recent weight: Weight: 59.1 kg (130 lb 3.2 oz)       Palliative Assessment/Data: PPS: 20%      Patient Active Problem List   Diagnosis Date Noted  . Pressure injury of skin 04/14/2017  . Advance care planning   . Acute on chronic respiratory failure with hypoxia ([place]) 04/11/2017  . Oral candidiasis 04/04/2017  . SOB (shortness of breath)   . Palliative care encounter   . Goals of care, counseling/discussion   . Palliative care by  specialist   . Chronic respiratory failure with hypoxia and hypercapnia ([place]) 03/07/2017  . Hyperglycemia 03/07/2017  . Shortness of breath 07/29/2016  . Acute diastolic heart failure ([place]) 01/27/2016  . Acute on chronic diastolic CHF (congestive heart failure), NYHA class 3 ([place]) 01/27/2016  . DOE (dyspnea on exertion) 01/27/2016  . Hypertensive heart disease with CHF (congestive heart failure) ([place]) 01/27/2016  . Diastolic dysfunction 81/27/5170  . CAD in native artery   . HLD (hyperlipidemia)   . Anxiety state   . Hypertrophic cardiomyopathy ([place])   . Malnutrition of moderate degree 09/14/2015  . Elevated troponin   . Respiratory distress 09/09/2015  . Acute on chronic respiratory failure with hypoxia and hypercapnia ([place]) 09/09/2015  . Sepsis ([place]) 09/09/2015  . Leukocytosis   . Elevated troponin I measurement   . CAD (coronary artery disease), native coronary artery 01/17/2013  . Carotid stenosis 05/09/2012  . Dynamic left ventricular outflow obstruction 04/19/2012  . Multiple lung nodules 04/19/2012  . Hyperlipidemia 04/16/2012  . Hypoxemia 04/16/2012  . HTN (hypertension) 07/08/2011  . Carotid artery disease ([place]) 03/03/2011  . Occlusion and stenosis of carotid artery without mention of cerebral infarction 02/14/2011  . COPD (chronic obstructive pulmonary disease) ([place]) 06/15/2010  . DYSPNEA 06/15/2010  . Osteoporosis 01/29/2010  . PVD 09/29/2008  . Aortic aneurysm ([place]) 03/10/2008  . Phlebitis and thrombophlebitis of superficial veins of upper extremities 10/17/2007  . Embolism and thrombosis of artery ([place]) 09/08/2007  . Iron deficiency anemia 09/08/2007  . Tachycardia 09/08/2007  . Embolism and thrombosis of other specified artery(444.89) 09/08/2007  . Anxiety 01/09/2007  . CANDIDIASIS, VAGINAL 12/12/2006  . ACTINOMYCOSIS, CERVICOFACIAL 12/01/2006  . EMPHYSEMA NEC 12/01/2006    Palliative Care Assessment & Plan   Patient Profile: 82  y.o.femalewith past  medical history of severe COPD with hypoxic and hypercarbic respiratory failure, diastolic CHF, anemia, anxiety, CAD, HTN,admitted on 10/2/2018with severe SOB- workup reveals acute on chronic respiratory failure with hypoxia secondary to COPD. This is her second admission in the last six months. Most recently she was discharged home on 03/15/2017 with plans for outpatient Palliative follow up. This was not arranged. Palliative medicine consulted for [place].    Assessment/Recommendations/Plan   Discharge home with Hospice and transition care to focus on comfort and support through end of life  Increase MS contin to 30 mg PO BID based on   Lactulose 10 mg once for constipation- continue senna 2 po BID and miralax 17gm in 4oz water once daily  D/C ipitroprium nebulizers as these are not providing comfort and increase patient's anxiety and SOB   Goals of Care and Additional Recommendations:  Limitations on Scope of Treatment: Avoid Hospitalization and Minimize Medications  Code Status:  DNR  Prognosis:   < 4 weeks due to end stage COPD with transition in care to focus on comfort at end of life  Discharge Planning:  Home with Hospice  Care plan was discussed with patient and family.   Thank you for allowing the Palliative Medicine Team to assist in the care of this patient.   Time In: 1500 Time Out: 1635 Total Time 95 minutes Prolonged time billed  yes      Greater than 50%  of this time was spent counseling and coordinating care related to the above assessment and plan.  Mariana Kaufman, AGNP-C Palliative Medicine   Please contact Palliative Medicine Team phone at [phone] for questions and concerns.

## 2017-04-14 NOTE — Progress Notes (Signed)
Pt refused to wear Bipap. RT will continue to monitor. 

## 2017-04-14 NOTE — Progress Notes (Addendum)
Progress Note    [name]  ZOX:[id number] DOB: [birth year]  DOA: 04/11/2017 PCP: [name], MD    Brief Narrative:   Chief complaint: F/U respiratory failure  Medical records reviewed and are as summarized below:  [name] is an 82 y.o. female with past medical history of severe COPD with hypoxic and hypercarbic respiratory failure, diastolic CHF, anemia, anxiety, CAD, HTN, admitted on 04/11/2017 with severe SOB- workup reveals acute on chronic respiratory failure with hypoxia secondary to COPD. This is her second admission in the last six months. Most recently she was discharged home on 03/15/2017 with plans for outpatient Palliative follow up. This was not arranged.    Assessment/Plan:   Principal problem:  Acute on Chronic Respiratory failure with Hypoxia and Hypercarbia Multifactorial: Severe COPD, OSA, chronic diastolic CHF.The patient does not have a BiPAP in her home. No pneumonia on chest x-ray on admission. Remains on prednisone, bronchodilators, Dulera, Mucinex and empiric antibiotics with Levaquin. Continue supplemental oxygen. Continue morphine as needed for air hunger. Case management is attempting to procure Trilogy machine while awaiting insurance clearance for BiPAP. Not sure that the patient will be compliant given that she has intermittently refused to wear BiPAP here. Follow-up blood cultures.  Active problems:  CAD Troponin negative. Continue aspirin.  Hyperglycemia,  Thought to be steroid-induced. Hemoglobin A1c is 5.6%. Continue insulin sensitive SSI.  Essential Hypertension BP 120/80  Pulse107  Controlled.  Hyperlipidemia Continue Lipitor.  Chronic diastolic CHF. HCM,  Echocardiogram 09/2015 LVEF= 65%.-Grade 1 diastolic dysfunction. Lasix increased to 40 mg 3 times a day on 04/13/17 due to being 2.2 L positive. It appears that I/O balance is not being recorded accurately, and patient has had approximately 3 pound weight gain over  the past 24 hours..  CKD stage III (baseline Cr 1.29   Creatinine consistent with usual baseline values.  Anemia of chronic disease Hemoglobin stable. Anemia panel does not show any deficiencies.  Anxiety Continue clonazepam 0.25 mg TID.  GERD Continue PPI.  Goals of care Palliative care meeting scheduled for today.  Stage I pressure injury left heel Reduce pressure to prevent further injury.   Family Communication/Anticipated D/C date and plan/Code Status   DVT prophylaxis: Lovenox ordered. Code Status: Full Code.  Family Communication: Husband at bedside. Disposition Plan: Home with hospice 04/15/17.   Medical Consultants:    Palliative Care   Anti-Infectives:    Azithromycin 04/11/17---> 04/11/17  Levaquin 04/12/17--->  Subjective:   Very dyspneic with pursed lipped breathing. No cough. Poor appetite. No BM in several days.  Objective:    Vitals:   04/14/17 0445 04/14/17 0504 04/14/17 0640 04/14/17 0800  BP:  126/84    Pulse:  70 70   Resp:  (!) 22 (!) 21   Temp:  97.9 F (36.6 C)    TempSrc:  Oral    SpO2:  100% 100% 97%  Weight: 59.1 kg (130 lb 3.2 oz)     Height:        Intake/Output Summary (Last 24 hours) at 04/14/17 0821 Last data filed at 04/14/17 0452  Gross per 24 hour  Intake                0 ml  Output              375 ml  Net             -375 ml   Filed Weights   04/12/17 0500 04/13/17 0500 04/14/17  0445  Weight: 52.4 kg (115 lb 8 oz) 53.9 kg (118 lb 14.4 oz) 59.1 kg (130 lb 3.2 oz)    Exam: General: Frail female with moderate respiratory distress.. Cardiovascular: Heart sounds show a tachycardic rate, regular rhythm. No gallops or rubs. No murmurs. No JVD. Lungs: High-pitched upper airway wheezes in the neck area with significantly prolonged expiratory phase and pursed lipped breathing.. Abdomen: Soft, nontender, nondistended with normal active bowel sounds. No masses. No hepatosplenomegaly. Neurological: Alert and  oriented 3. Moves all extremities 4 with equal strength. Cranial nerves II through XII grossly intact. Skin: Feet cold to touch. No rashes or lesions. Extremities: Cyanotic fingertips and toes. No edema. Pedal pulses 2+. Psychiatric: Mood and affect are normal. Insight and judgment are normal.  Data Reviewed:   I have personally reviewed following labs and imaging studies:  Labs: Labs show the following:   Basic Metabolic Panel:  Recent Labs Lab 04/11/17 0845 04/12/17 0221 04/13/17 1024  NA 134* 135 136  K 3.2* 4.1 3.5  CL 87* 90* 92*  CO2 30 34* 34*  GLUCOSE 219* 136* 129*  BUN 14 17 22*  CREATININE 1.29* 1.01* 1.10*  CALCIUM 9.4 8.9 9.1  MG  --   --  2.0   GFR Estimated Creatinine Clearance: 34.2 mL/min (A) (by C-G formula based on SCr of 1.1 mg/dL (H)). Liver Function Tests:  Recent Labs Lab 04/11/17 0845 04/12/17 0221  AST 26 15  ALT 22 18  ALKPHOS 59 41  BILITOT 0.7 0.7  PROT 7.0 5.2*  ALBUMIN 3.4* 2.6*   Coagulation profile  Recent Labs Lab 04/12/17 0221  INR 0.98    CBC:  Recent Labs Lab 04/11/17 0845 04/12/17 0221  WBC 16.4* 9.3  NEUTROABS 11.7*  --   HGB 12.3 9.9*  HCT 38.0 29.1*  MCV 93.1 92.7  PLT 390 297   Cardiac Enzymes:  Recent Labs Lab 04/11/17 0845  TROPONINI 0.03*   BNP (last 3 results)  Recent Labs  07/15/16 1542 01/26/17 1137 03/21/17 1314  PROBNP 90 91 94.0   CBG:  Recent Labs Lab 04/13/17 0819 04/13/17 1133 04/13/17 1655 04/13/17 2118 04/14/17 0819  GLUCAP 108* 158* 136* 171* 103*    Anemia work up:  Recent Labs  04/13/17 1024  VITAMINB12 489  FOLATE 26.0  FERRITIN 93  TIBC 301  IRON 43  RETICCTPCT 1.4   Sepsis Labs:  Recent Labs Lab 04/11/17 0845 04/12/17 0221  WBC 16.4* 9.3    Microbiology Recent Results (from the past 240 hour(s))  MRSA PCR Screening     Status: None   Collection Time: 04/11/17  6:54 PM  Result Value Ref Range Status   MRSA by PCR NEGATIVE NEGATIVE Final      Comment:        The GeneXpert MRSA Assay (FDA approved for NASAL specimens only), is one component of a comprehensive MRSA colonization surveillance program. It is not intended to diagnose MRSA infection nor to guide or monitor treatment for MRSA infections.   Culture, blood (routine x 2)     Status: None (Preliminary result)   Collection Time: 04/12/17 10:15 AM  Result Value Ref Range Status   Specimen Description BLOOD RIGHT ANTECUBITAL  Final   Special Requests IN PEDIATRIC BOTTLE Blood Culture adequate volume  Final   Culture NO GROWTH 1 DAY  Final   Report Status PENDING  Incomplete  Culture, blood (routine x 2)     Status: None (Preliminary result)   Collection Time:  04/12/17 10:22 AM  Result Value Ref Range Status   Specimen Description BLOOD LEFT HAND  Final   Special Requests IN PEDIATRIC BOTTLE Blood Culture adequate volume  Final   Culture NO GROWTH 1 DAY  Final   Report Status PENDING  Incomplete  Respiratory Panel by PCR     Status: None   Collection Time: 04/13/17  2:04 PM  Result Value Ref Range Status   Adenovirus NOT DETECTED NOT DETECTED Final   Coronavirus 229E NOT DETECTED NOT DETECTED Final   Coronavirus HKU1 NOT DETECTED NOT DETECTED Final   Coronavirus NL63 NOT DETECTED NOT DETECTED Final   Coronavirus OC43 NOT DETECTED NOT DETECTED Final   Metapneumovirus NOT DETECTED NOT DETECTED Final   Rhinovirus / Enterovirus NOT DETECTED NOT DETECTED Final   Influenza A NOT DETECTED NOT DETECTED Final   Influenza B NOT DETECTED NOT DETECTED Final   Parainfluenza Virus 1 NOT DETECTED NOT DETECTED Final   Parainfluenza Virus 2 NOT DETECTED NOT DETECTED Final   Parainfluenza Virus 3 NOT DETECTED NOT DETECTED Final   Parainfluenza Virus 4 NOT DETECTED NOT DETECTED Final   Respiratory Syncytial Virus NOT DETECTED NOT DETECTED Final   Bordetella pertussis NOT DETECTED NOT DETECTED Final   Chlamydophila pneumoniae NOT DETECTED NOT DETECTED Final   Mycoplasma  pneumoniae NOT DETECTED NOT DETECTED Final    Procedures and diagnostic studies:  04/12/17: Dg Chest Port 1 View: My independent review of the image shows: Flattened diaphragms consistent with severe COPD. Tiny pleural effusion on the right. Hyperexpanded lungs.    Medications:   . aspirin EC  81 mg Oral Daily  . atorvastatin  20 mg Oral Daily  . clonazePAM  0.25 mg Oral TID  . enoxaparin (LOVENOX) injection  30 mg Subcutaneous Q24H  . furosemide  40 mg Oral TID  . guaiFENesin  1,200 mg Oral BID  . insulin aspart  0-9 Units Subcutaneous TID WC  . ipratropium  0.5 mg Nebulization TID  . mometasone-formoterol  2 puff Inhalation BID  . morphine  15 mg Oral Q12H  . nebivolol  5 mg Oral Daily  . pantoprazole  40 mg Oral Daily  . polyethylene glycol  17 g Oral Daily  . predniSONE  50 mg Oral BID WC  . senna-docusate  2 tablet Oral BID   Continuous Infusions: . levofloxacin (LEVAQUIN) IV Stopped (04/12/17 1330)     LOS: 3 days   RAMA,CHRISTINA  [facility] Pager [phone]. If unable to reach me by pager, please call my cell phone at [phone].  *Please refer to [email or web address], password [email or web address] to get updated schedule on who will round on this patient, as hospitalists switch teams weekly. If 7PM-7AM, please contact night-coverage at [email or web address], password [email or web address] for any overnight needs.  04/14/2017, 8:21 AM

## 2017-04-15 DIAGNOSIS — I110 Hypertensive heart disease with heart failure: Secondary | ICD-10-CM

## 2017-04-15 DIAGNOSIS — R000 Tachycardia, unspecified: Secondary | ICD-10-CM

## 2017-04-15 DIAGNOSIS — I5043 Acute on chronic combined systolic (congestive) and diastolic (congestive) heart failure: Secondary | ICD-10-CM

## 2017-04-15 LAB — GLUCOSE, CAPILLARY
GLUCOSE-CAPILLARY: 112 mg/dL — AB (ref 65–99)
Glucose-Capillary: 122 mg/dL — ABNORMAL HIGH (ref 65–99)

## 2017-04-15 MED ORDER — CLONAZEPAM 0.5 MG PO TABS: 0.2500 mg | ORAL_TABLET | Freq: Three times a day (TID) | ORAL | 0 refills | Status: AC

## 2017-04-15 MED ORDER — LEVOFLOXACIN 750 MG PO TABS: 750.0000 mg | ORAL_TABLET | ORAL | 0 refills | Status: AC

## 2017-04-15 MED ORDER — FUROSEMIDE 40 MG PO TABS: 40.0000 mg | ORAL_TABLET | Freq: Three times a day (TID) | ORAL | 0 refills | Status: AC

## 2017-04-15 MED ORDER — MORPHINE SULFATE (CONCENTRATE) 10 MG /0.5 ML PO SOLN: 2.5000 mg | Freq: Two times a day (BID) | ORAL | 0 refills | Status: AC | PRN

## 2017-04-15 MED ORDER — POLYETHYLENE GLYCOL 3350 17 G PO PACK: 17.0000 g | PACK | Freq: Every day | ORAL | 0 refills | Status: AC

## 2017-04-15 MED ORDER — SENNOSIDES-DOCUSATE SODIUM 8.6-50 MG PO TABS: 2.0000 | ORAL_TABLET | Freq: Two times a day (BID) | ORAL | 0 refills | Status: AC

## 2017-04-15 MED ORDER — LORAZEPAM 0.5 MG PO TABS: 0.5000 mg | ORAL_TABLET | Freq: Four times a day (QID) | ORAL | 0 refills | Status: AC | PRN

## 2017-04-15 NOTE — Discharge Summary (Signed)
Physician Discharge Summary  [name] ZOX:[id number] DOB: [birth year] DOA: 04/11/2017  PCP: [name], MD  Admit date: 04/11/2017 Discharge date: 04/15/2017  Admitted From: Home Discharge disposition: Home with hospice   Recommendations for Outpatient Follow-Up:   1. Discharging home with hospice care.   Discharge Diagnosis:   Principal Problem:   Acute on chronic respiratory failure with hypoxia and hypercapnia (HCC) Active Problems:   EMPHYSEMA NEC   COPD (chronic obstructive pulmonary disease) (HCC)   Carotid artery disease (HCC)   HTN (hypertension)   Hyperlipidemia   Hypoxemia   CAD (coronary artery disease), native coronary artery   Respiratory distress   Anxiety state   Hypertrophic cardiomyopathy (HCC)   Iron deficiency anemia   Tachycardia   Acute on chronic diastolic CHF (congestive heart failure), NYHA class 3 (HCC)   Hypertensive heart disease with CHF (congestive heart failure) (HCC)   Shortness of breath   Advance care planning   Pressure injury of skin  Discharge Condition: Terminal.  Diet recommendation: Low sodium, heart healthy.    History of Present Illness:   [name] is an 82 y.o. female with past medical history of severe COPD with hypoxic and hypercarbic respiratory failure, diastolic CHF, anemia, anxiety, CAD, HTN,admitted on 10/2/2018with severe SOB- workup reveals acute on chronic respiratory failure with hypoxia secondary to COPD. This is her second admission in the last six months. Most recently she was discharged home on 03/15/2017 with plans for outpatient Palliative follow up. This was not arranged.   Hospital Course by Problem:   Principal problem:  Acute on Chronic Respiratory failure with Hypoxia and Hypercarbia Multifactorial: Severe COPD, OSA, chronic diastolic CHF.The patient does not have a BiPAP in her home. No pneumonia on chest x-ray on admission. Remains on prednisone, bronchodilators, Dulera, Mucinex and  empiric antibiotics with Levaquin. Continue supplemental oxygen. Continue morphine as needed for air hunger. Case management is attempting to procure Trilogy machine while awaiting insurance clearance for BiPAP. Not sure that the patient will be compliant given that she has intermittently refused to wear BiPAP here. Follow-up blood cultures.  Active problems:  CAD Troponin negative. Continue aspirin.  Hyperglycemia,  Thought to be steroid-induced. Hemoglobin A1c is 5.6%. Continue insulin sensitive SSI.  Essential Hypertension BP 120/80  Pulse107  Controlled.  Hyperlipidemia Continue Lipitor.  Chronic diastolic CHF. HCM,  Echocardiogram 09/2015 LVEF= 65%.-Grade 1 diastolic dysfunction. Lasix increased to 40 mg 3 times a day on 04/13/17 due to being 2.2 L positive. It appears that I/O balance is not being recorded accurately, and patient has had approximately 3 pound weight gain over the past 24 hours..  CKD stage III (baseline Cr 1.29  Creatinine consistent with usual baseline values.  Anemia of chronic disease Hemoglobin stable. Anemia panel does not show any deficiencies.  Anxiety Continue clonazepam 0.25 mg TID.  GERD Continue PPI.  Goals of care Palliative care meeting scheduled for today.  Stage I pressure injury left heel Reduce pressure to prevent further injury.    Medical Consultants:    Palliative Care   Discharge Exam:   Vitals:   04/15/17 1533 04/15/17 1537  BP:  (!) 156/84  Pulse:    Resp:    Temp: 97.7 F (36.5 C)   SpO2:     Vitals:   04/15/17 0829 04/15/17 1121 04/15/17 1533 04/15/17 1537  BP: 137/76 132/86  (!) 156/84  Pulse:      Resp: 20 (!) 25    Temp: 97.8 F (36.6 C)  97.6 F (36.4 C) 97.7 F (36.5 C)   TempSrc: Axillary Axillary Oral   SpO2:      Weight:      Height:        General exam: Frail, mild respiratory distress. Respiratory system: Labored breathing with few expiratory wheezes.. Cardiovascular  system: S1 & S2 heard, RRR. No JVD,  rubs, gallops or clicks. No murmurs. Gastrointestinal system: Abdomen is nondistended, soft and nontender. No organomegaly or masses felt. Normal bowel sounds heard. Central nervous system: Alert and oriented. No focal neurological deficits. Extremities: No clubbing, or edema. Fingertips and toes slightly cyanotic.. Skin: No rashes, lesions or ulcers. Psychiatry: Judgement and insight appear normal. Mood & affect appropriate.    The results of significant diagnostics from this hospitalization (including imaging, microbiology, ancillary and laboratory) are listed below for reference.     Procedures and Diagnostic Studies:   Dg Chest Port 1 View  Result Date: 04/12/2017 CLINICAL DATA:  COPD exacerbation. EXAM: PORTABLE CHEST 1 VIEW COMPARISON:  04/11/2017 and previous FINDINGS: Heart size is normal. Aortic atherosclerosis again noted. Upper lobe predominant emphysema and pulmonary scarring as seen previously. Question the presence of small subpulmonic effusions, at least on the right. No acute bone finding. IMPRESSION: Chronic lung disease with emphysema and pulmonary scarring. Question small subpulmonic effusions, at least on the right. Electronically Signed   By: Paulina Fusi M.D.   On: 04/12/2017 10:32   Dg Chest Port 1 View  Result Date: 04/11/2017 CLINICAL DATA:  Respiratory distress EXAM: PORTABLE CHEST 1 VIEW COMPARISON:  March 11, 2017 FINDINGS: There is extensive upper lobe bullous disease. Lungs are somewhat hyperexpanded. There are small pleural effusions bilaterally. There is no edema or consolidation. The heart size is normal. Pulmonary vascularity reflects the underlying bullous disease and is stable. There is aortic atherosclerosis. No evident bone lesions. IMPRESSION: Extensive upper lobe bullous disease, stable. No edema or consolidation. Small pleural effusions bilaterally. Cardiac silhouette within normal limits. There is aortic  atherosclerosis. Aortic Atherosclerosis (ICD10-I70.0). Electronically Signed   By: Bretta Bang III M.D.   On: 04/11/2017 09:07     Labs:   Basic Metabolic Panel:  Recent Labs Lab 04/11/17 0845 04/12/17 0221 04/13/17 1024  NA 134* 135 136  K 3.2* 4.1 3.5  CL 87* 90* 92*  CO2 30 34* 34*  GLUCOSE 219* 136* 129*  BUN 14 17 22*  CREATININE 1.29* 1.01* 1.10*  CALCIUM 9.4 8.9 9.1  MG  --   --  2.0   GFR Estimated Creatinine Clearance: 31.3 mL/min (A) (by C-G formula based on SCr of 1.1 mg/dL (H)). Liver Function Tests:  Recent Labs Lab 04/11/17 0845 04/12/17 0221  AST 26 15  ALT 22 18  ALKPHOS 59 41  BILITOT 0.7 0.7  PROT 7.0 5.2*  ALBUMIN 3.4* 2.6*   Coagulation profile  Recent Labs Lab 04/12/17 0221  INR 0.98    CBC:  Recent Labs Lab 04/11/17 0845 04/12/17 0221  WBC 16.4* 9.3  NEUTROABS 11.7*  --   HGB 12.3 9.9*  HCT 38.0 29.1*  MCV 93.1 92.7  PLT 390 297   Cardiac Enzymes:  Recent Labs Lab 04/11/17 0845  TROPONINI 0.03*   CBG:  Recent Labs Lab 04/14/17 1251 04/14/17 1613 04/14/17 2153 04/15/17 0831 04/15/17 1123  GLUCAP 128* 137* 121* 122* 112*   Anemia work up  Recent Labs  04/13/17 1024  VITAMINB12 489  FOLATE 26.0  FERRITIN 93  TIBC 301  IRON 43  RETICCTPCT 1.4  Microbiology Recent Results (from the past 240 hour(s))  MRSA PCR Screening     Status: None   Collection Time: 04/11/17  6:54 PM  Result Value Ref Range Status   MRSA by PCR NEGATIVE NEGATIVE Final    Comment:        The GeneXpert MRSA Assay (FDA approved for NASAL specimens only), is one component of a comprehensive MRSA colonization surveillance program. It is not intended to diagnose MRSA infection nor to guide or monitor treatment for MRSA infections.   Culture, blood (routine x 2)     Status: None (Preliminary result)   Collection Time: 04/12/17 10:15 AM  Result Value Ref Range Status   Specimen Description BLOOD RIGHT ANTECUBITAL  Final     Special Requests IN PEDIATRIC BOTTLE Blood Culture adequate volume  Final   Culture NO GROWTH 3 DAYS  Final   Report Status PENDING  Incomplete  Culture, blood (routine x 2)     Status: None (Preliminary result)   Collection Time: 04/12/17 10:22 AM  Result Value Ref Range Status   Specimen Description BLOOD LEFT HAND  Final   Special Requests IN PEDIATRIC BOTTLE Blood Culture adequate volume  Final   Culture NO GROWTH 3 DAYS  Final   Report Status PENDING  Incomplete  Respiratory Panel by PCR     Status: None   Collection Time: 04/13/17  2:04 PM  Result Value Ref Range Status   Adenovirus NOT DETECTED NOT DETECTED Final   Coronavirus 229E NOT DETECTED NOT DETECTED Final   Coronavirus HKU1 NOT DETECTED NOT DETECTED Final   Coronavirus NL63 NOT DETECTED NOT DETECTED Final   Coronavirus OC43 NOT DETECTED NOT DETECTED Final   Metapneumovirus NOT DETECTED NOT DETECTED Final   Rhinovirus / Enterovirus NOT DETECTED NOT DETECTED Final   Influenza A NOT DETECTED NOT DETECTED Final   Influenza B NOT DETECTED NOT DETECTED Final   Parainfluenza Virus 1 NOT DETECTED NOT DETECTED Final   Parainfluenza Virus 2 NOT DETECTED NOT DETECTED Final   Parainfluenza Virus 3 NOT DETECTED NOT DETECTED Final   Parainfluenza Virus 4 NOT DETECTED NOT DETECTED Final   Respiratory Syncytial Virus NOT DETECTED NOT DETECTED Final   Bordetella pertussis NOT DETECTED NOT DETECTED Final   Chlamydophila pneumoniae NOT DETECTED NOT DETECTED Final   Mycoplasma pneumoniae NOT DETECTED NOT DETECTED Final     Discharge Instructions:   Discharge Instructions    (HEART FAILURE PATIENTS) Call MD:  Anytime you have any of the following symptoms: 1) 3 pound weight gain in 24 hours or 5 pounds in 1 week 2) shortness of breath, with or without a dry hacking cough 3) swelling in the hands, feet or stomach 4) if you have to sleep on extra pillows at night in order to breathe.    Complete by:  As directed    Call MD for:   difficulty breathing, headache or visual disturbances    Complete by:  As directed    Call MD for:  extreme fatigue    Complete by:  As directed    Call MD for:  persistant nausea and vomiting    Complete by:  As directed    Call MD for:  severe uncontrolled pain    Complete by:  As directed    Diet general    Complete by:  As directed    Increase activity slowly    Complete by:  As directed      Allergies as of 04/15/2017  Reactions   Levaquin [levofloxacin In D5w] Other (See Comments)   Can't sleep    Penicillins Hives   Has patient had a PCN reaction causing immediate rash, facial/tongue/throat swelling, SOB or lightheadedness with hypotension: Yes Has patient had a PCN reaction causing severe rash involving mucus membranes or skin necrosis: Yes Has patient had a PCN reaction that required hospitalization: No Has patient had a PCN reaction occurring within the last 10 years: Yes If all of the above answers are "NO", then may proceed with Cephalosporin use.   Pneumovax [pneumococcal Polysaccharide Vaccine] Hives, Swelling      Medication List    STOP taking these medications   SPIRIVA RESPIMAT 2.5 MCG/ACT Aers Generic drug:  Tiotropium Bromide Monohydrate     TAKE these medications   albuterol 108 (90 Base) MCG/ACT inhaler Commonly known as:  PROVENTIL HFA;VENTOLIN HFA Inhale 2 puffs into the lungs every 6 (six) hours as needed. For shortness of breath   aspirin 81 MG tablet Take 81 mg by mouth daily.   aspirin-acetaminophen-caffeine 250-250-65 MG tablet Commonly known as:  EXCEDRIN MIGRAINE Take 1 tablet by mouth every 6 (six) hours as needed for headache.   atorvastatin 20 MG tablet Commonly known as:  LIPITOR Take 1 tablet (20 mg total) by mouth daily.   budesonide-formoterol 160-4.5 MCG/ACT inhaler Commonly known as:  SYMBICORT Inhale 2 puffs into the lungs 2 (two) times daily.   BYSTOLIC 5 MG tablet Generic drug:  nebivolol TAKE 1 TABLET (5 MG TOTAL)  BY MOUTH DAILY.   CALCIUM 600 + D PO Take 1 tablet by mouth daily.   clonazePAM 0.5 MG tablet Commonly known as:  KLONOPIN Take 0.5 tablets (0.25 mg total) by mouth 3 (three) times daily. What changed:  when to take this   clotrimazole 10 MG troche Commonly known as:  MYCELEX Take 1 tablet (10 mg total) by mouth 5 (five) times daily. What changed:  when to take this  reasons to take this   furosemide 40 MG tablet Commonly known as:  LASIX Take 1 tablet (40 mg total) by mouth 3 (three) times daily. What changed:  when to take this   guaiFENesin 600 MG 12 hr tablet Commonly known as:  MUCINEX Take 2 tablets (1,200 mg total) by mouth 2 (two) times daily.   ipratropium 0.02 % nebulizer solution Commonly known as:  ATROVENT Take 2.5 mLs (0.5 mg total) by nebulization 4 (four) times daily. DX: J44.9 What changed:  when to take this  additional instructions   levofloxacin 750 MG tablet Commonly known as:  LEVAQUIN Take 1 tablet (750 mg total) by mouth every other day.   LORazepam 0.5 MG tablet Commonly known as:  ATIVAN Take 1-2 tablets (0.5-1 mg total) by mouth every 6 (six) hours as needed for anxiety.   morphine CONCENTRATE 10 mg / 0.5 ml concentrated solution Place 0.13-0.25 mLs (2.6-5 mg total) under the tongue every 12 (twelve) hours as needed for shortness of breath. What changed:  when to take this   ondansetron 4 MG tablet Commonly known as:  ZOFRAN Take 1 tablet (4 mg total) by mouth every 6 (six) hours as needed for nausea.   pantoprazole 40 MG tablet Commonly known as:  PROTONIX Take 1 tablet (40 mg total) by mouth daily.   polyethylene glycol packet Commonly known as:  MIRALAX / GLYCOLAX Take 17 g by mouth daily. What changed:  when to take this   predniSONE 10 MG tablet Commonly known as:  DELTASONE  Take 1 tablet (10 mg total) by mouth daily.   senna-docusate 8.6-50 MG tablet Commonly known as:  Senokot-S Take 2 tablets by mouth 2 (two) times  daily.   traZODone 50 MG tablet Commonly known as:  DESYREL Take 0.5 tablets (25 mg total) by mouth at bedtime as needed for sleep.   UNABLE TO FIND Inhale 1.5-3 L into the lungs continuous.      Follow-up Information    [name], MD. Schedule an appointment as soon as possible for a visit.   Specialty:  Internal Medicine Why:  As needed. Contact information: [address] [place] [address] [phone]            Time coordinating discharge: > 35 minutes.  Signed:  Gene Colee  Pager [phone] [facility] 04/15/2017, 5:36 PM

## 2017-04-15 NOTE — Progress Notes (Signed)
[facility] with spouse at bedside to confirm [facility] RN will see patient this evening after patient arrives home. Spouse is leaving shortly to be home for DME delivery. Appreciate update from [facility].   Thank you,  Forrestine Him, LCSW [phone]

## 2017-04-15 NOTE — Progress Notes (Addendum)
[facility] text paged  Dr Reymundo Poll

## 2017-04-15 NOTE — Progress Notes (Signed)
Patient discharging home with hospice.  Discharge instructions given to patient, all questions answered at this time.  Prescriptions given to patients daughter Cindi.  All belongings sent with patient.

## 2017-04-15 NOTE — Progress Notes (Signed)
Spoke w husband at bedside, equipment to be delivered btwn 1-4. Spoke w nurse, updated that medical necessity form on chart, Rxs printed and RN will get MD to sign and will send home w patient. RN states she will call [facility] to schedule transport at 5:00, number on chart. Notified Gerilyn Pilgrim at [phone] that patient will leave hospital around 5:00. Awaiting DC note to fax.

## 2017-04-16 DIAGNOSIS — Z7189 Other specified counseling: Secondary | ICD-10-CM

## 2017-04-17 ENCOUNTER — Other Ambulatory Visit: Payer: Self-pay

## 2017-04-17 ENCOUNTER — Other Ambulatory Visit: Payer: Self-pay | Admitting: *Deleted

## 2017-04-17 LAB — CULTURE, BLOOD (ROUTINE X 2)
CULTURE: NO GROWTH
Culture: NO GROWTH
Special Requests: ADEQUATE
Special Requests: ADEQUATE

## 2017-04-17 NOTE — Patient Outreach (Signed)
[facility]) Care Management  04/17/2017  SHANDIE BERTZ [birth year] [id number]  Subjective: "the hospice nurse has come out. We are trying to keep her dry and comfortable".  Objective: none  Assessment: RNCM spoke with Mr. Falcon who reports client is off her regular medication and on morphine. He states client is not responding at this time and reports a nurse came out during the night to place a catheter. Mr. Issa reports a supportive family and states that family has been with them since her discharge. RNCM allowed Mr. Haynes time to discuss his feelings-supportive comments provided. RNCM encouraged Mr. Lyvers to ask for assistance and allow other to assist him.  RNCM encouraged Mr. Paterson to call RNCM as needed.   Plan: Client's care management needs are being met by another agency. RNCM will close case per policy.  Thea Silversmith, RN, MSN, [place] Coordinator Cell: [phone]

## 2017-04-17 NOTE — Consult Note (Signed)
[facility] Care Management follow up.  Chart reviewed. Noted [name] discharged home over the weekend with hospice services thru [facility].   Notification sent to [facility] RNCM to make aware of above.   Raiford Noble, MSN-Ed, RN,BSN [facility] Liaison [phone]

## 2017-04-21 ENCOUNTER — Ambulatory Visit: Payer: PPO | Admitting: Emergency Medicine

## 2017-04-24 ENCOUNTER — Ambulatory Visit: Payer: PPO | Admitting: Emergency Medicine

## 2017-04-27 ENCOUNTER — Encounter (HOSPITAL_COMMUNITY): Payer: PPO | Admitting: Cardiology

## 2017-05-08 ENCOUNTER — Ambulatory Visit: Payer: PPO | Admitting: Cardiology

## 2017-05-11 DEATH — deceased

## 2017-07-14 ENCOUNTER — Ambulatory Visit: Payer: PPO | Admitting: Cardiology

## 2017-09-24 IMAGING — DX DG CHEST 1V PORT
1 series · 1 of 1 positions shown · non-contrast
Comparison: 02/08/2017

CLINICAL DATA: Shortness of breath

EXAM:
PORTABLE CHEST 1 VIEW

[chest ap]
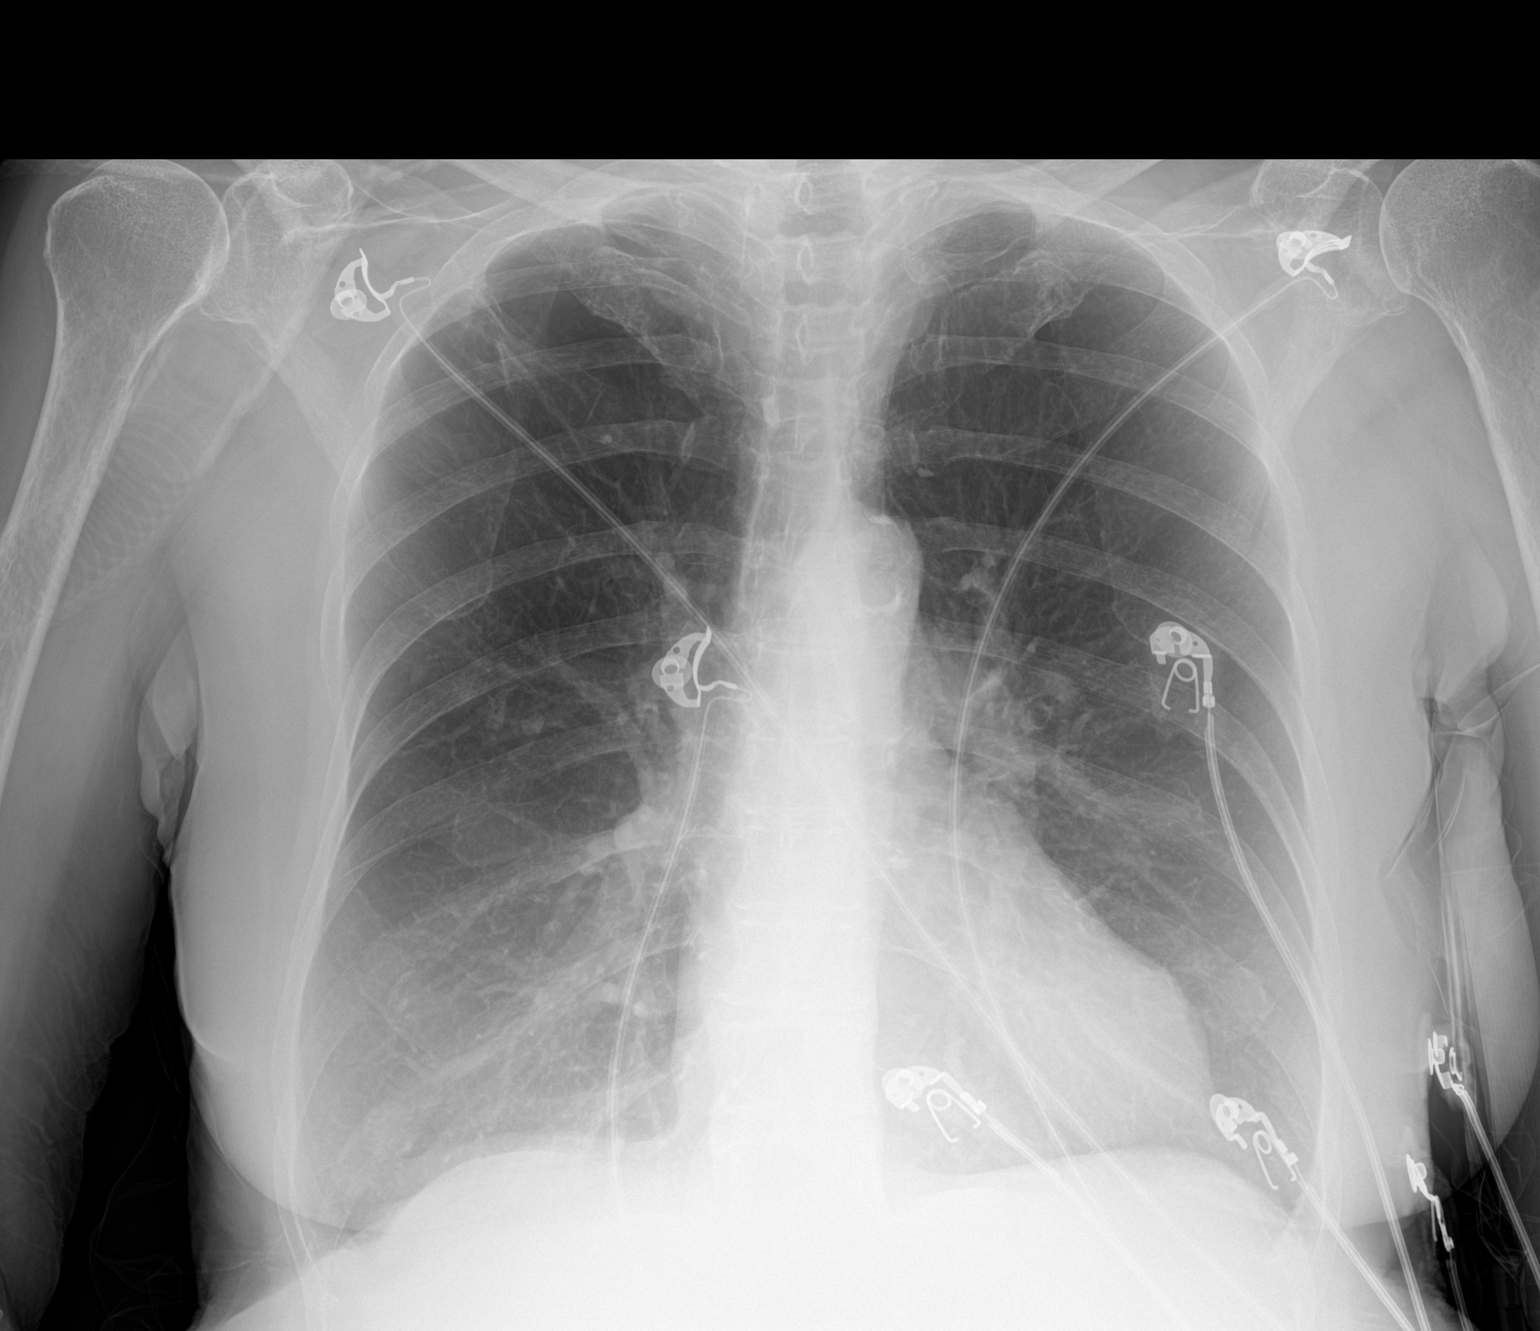

[1 of 1 positions shown; findings below may reference images not displayed]

FINDINGS: Hyperinflation. Right lower probable nipple shadow. No acute
consolidation or effusion. Stable cardiomediastinal silhouette with
atherosclerosis. Emphysematous changes. Scarring in the right upper
lobe. No pneumothorax.
IMPRESSION: Hyperinflation with emphysematous changes and scarring in the right
lung a PACs. No acute infiltrate or edema

## 2017-09-26 IMAGING — DX DG CHEST 1V PORT
1 series · 1 of 1 positions shown · non-contrast
Comparison: 03/07/2017

CLINICAL DATA: Shortness of Breath

EXAM:
PORTABLE CHEST 1 VIEW

[chest ap]
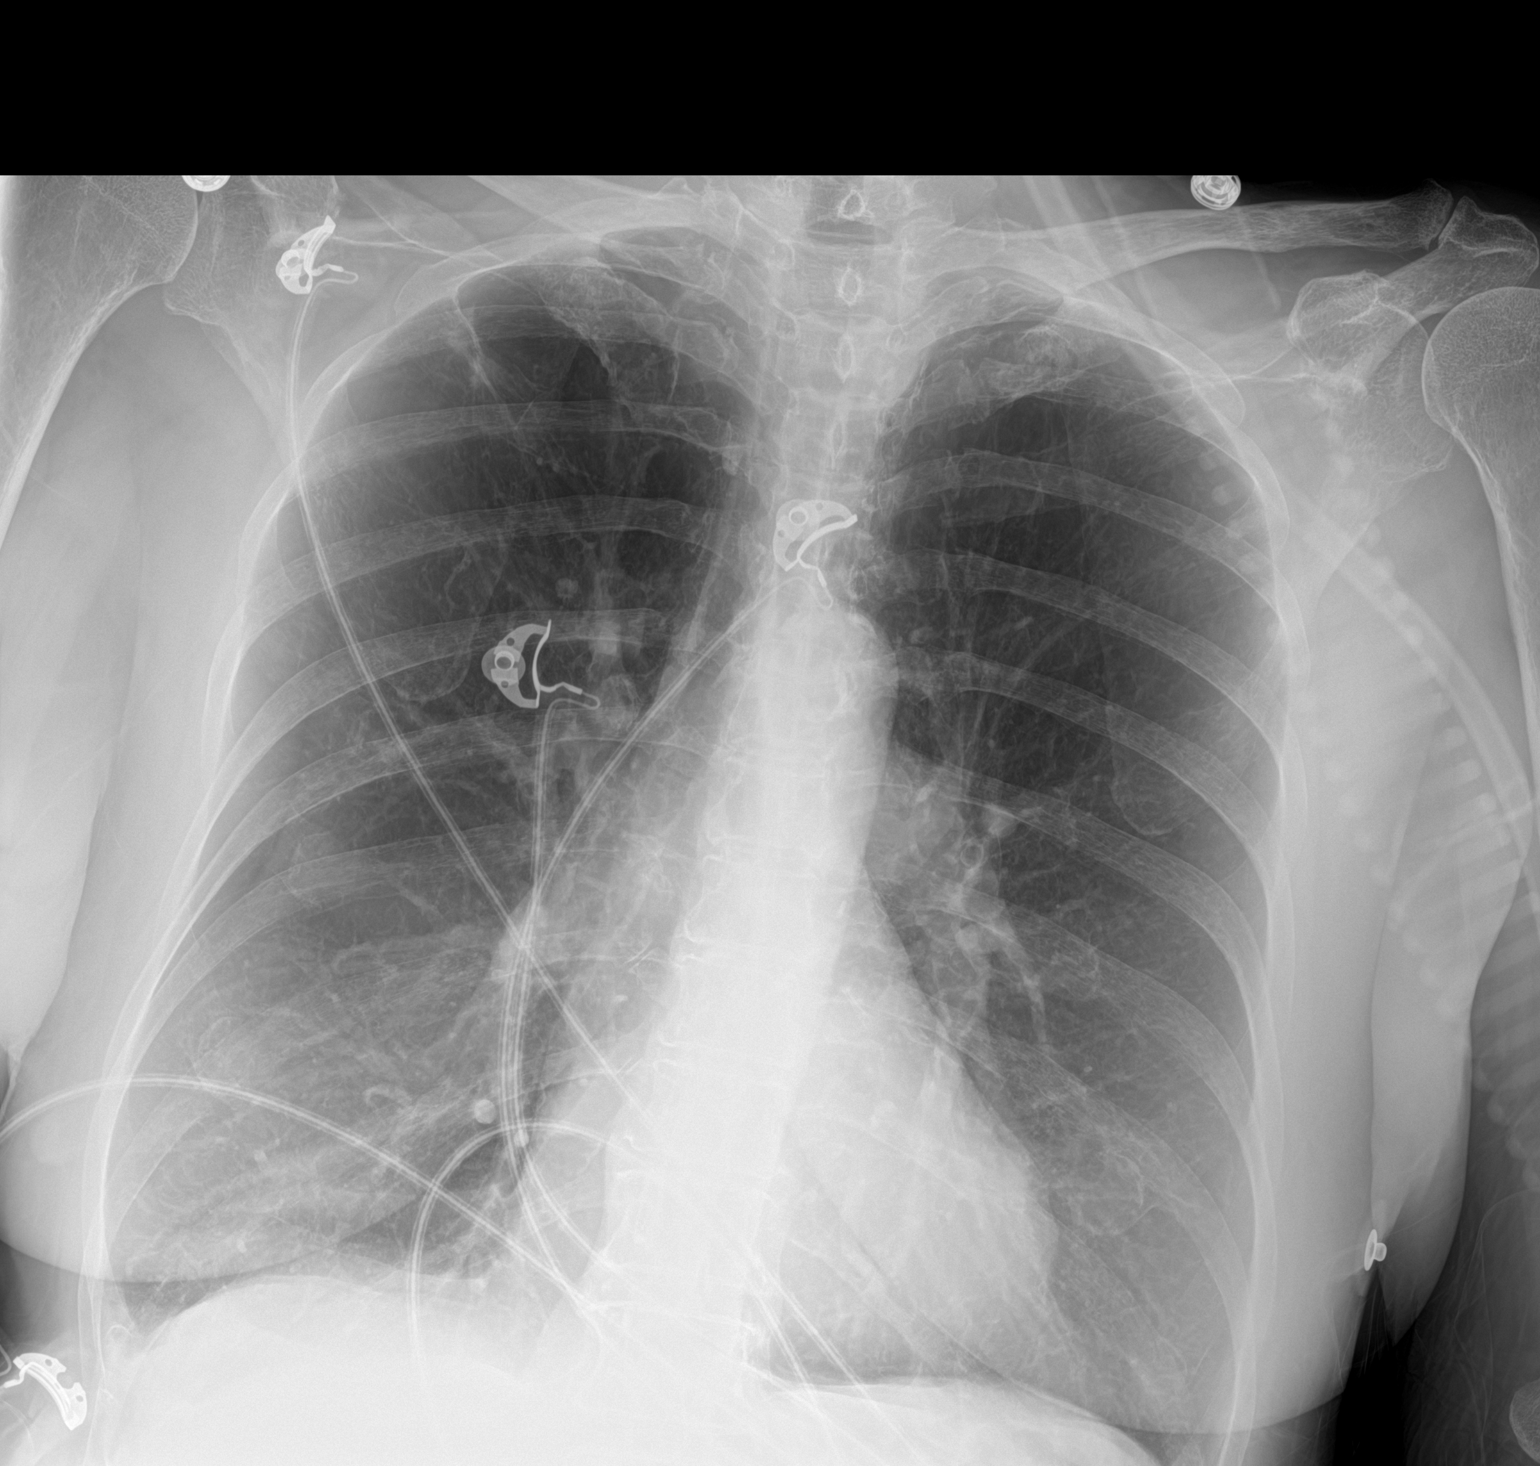

[1 of 1 positions shown; findings below may reference images not displayed]

FINDINGS: There is hyperinflation of the lungs compatible with COPD. Heart and
mediastinal contours are within normal limits. Scarring in the right
upper lobe. Otherwise, no focal opacities or effusions. No acute
bony abnormality.
IMPRESSION: COPD.  No active disease.

## 2019-07-26 IMAGING — US US EXTREM UP *R* LTD
1 series · 10 of 10 positions shown · non-contrast
Comparison: None

CLINICAL DATA: Antecubital hematoma

EXAM:
ULTRASOUND right UPPER EXTREMITY LIMITED
TECHNIQUE: Ultrasound examination of the upper extremity soft tissues was
performed in the area of clinical concern.

[Series 1: us extrem up *right* ltd · 0.05mm/px · 10 acquisitions, 10 frames shown]
[im 1/10]
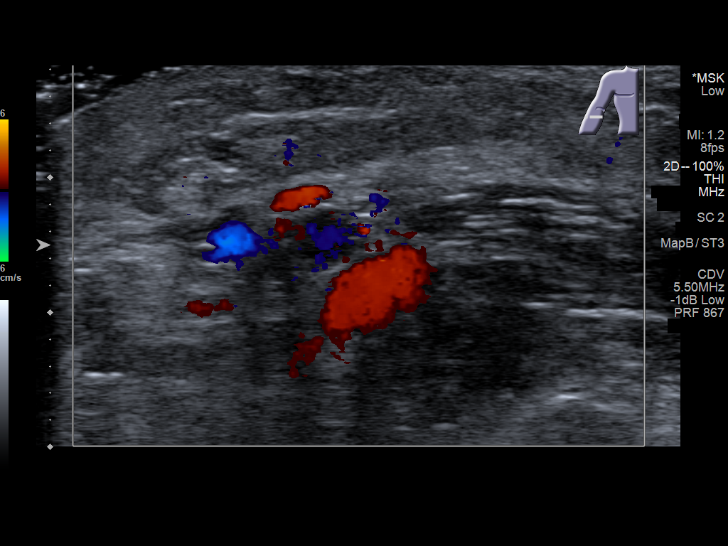
[im 2/10]
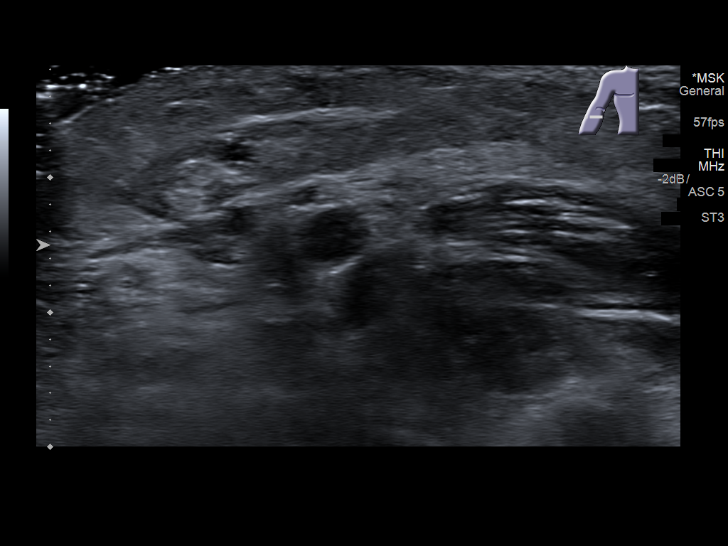
[im 3/10]
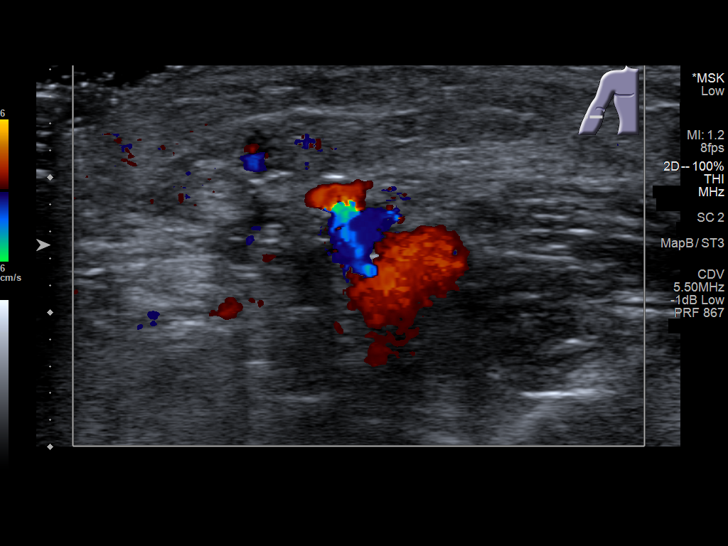
[im 4/10]
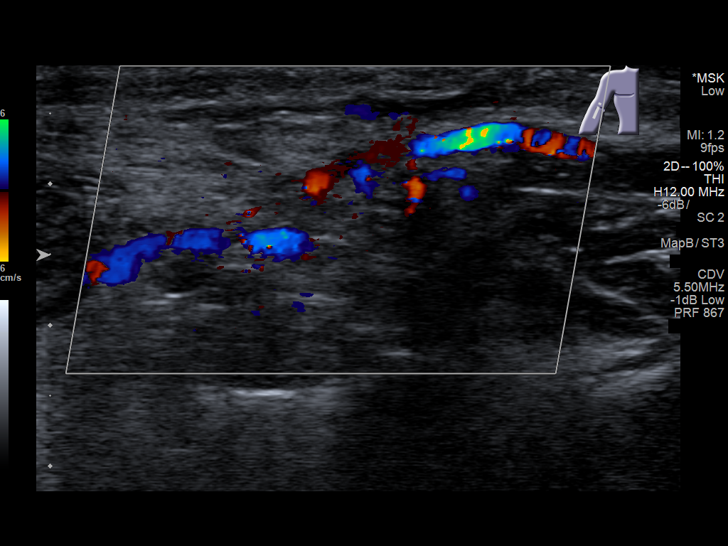
[im 5/10]
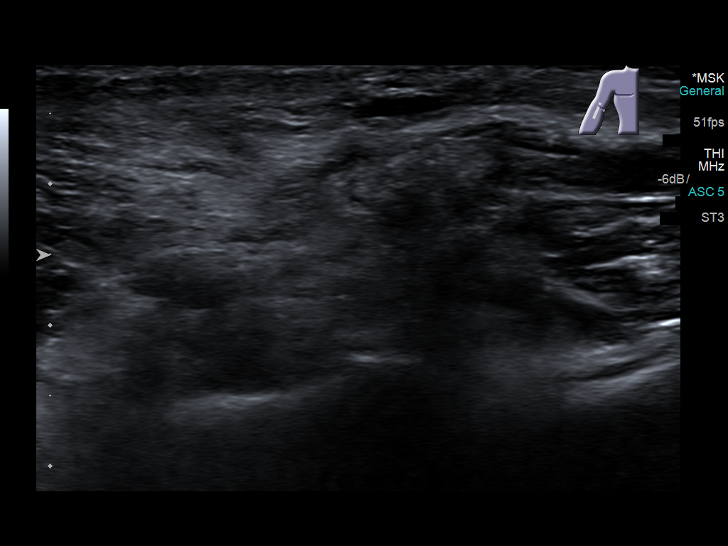
[im 6/10]
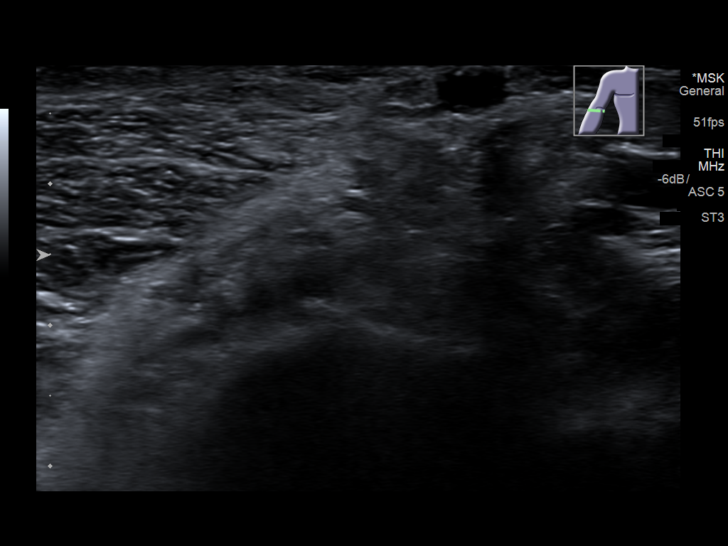
[im 7/10]
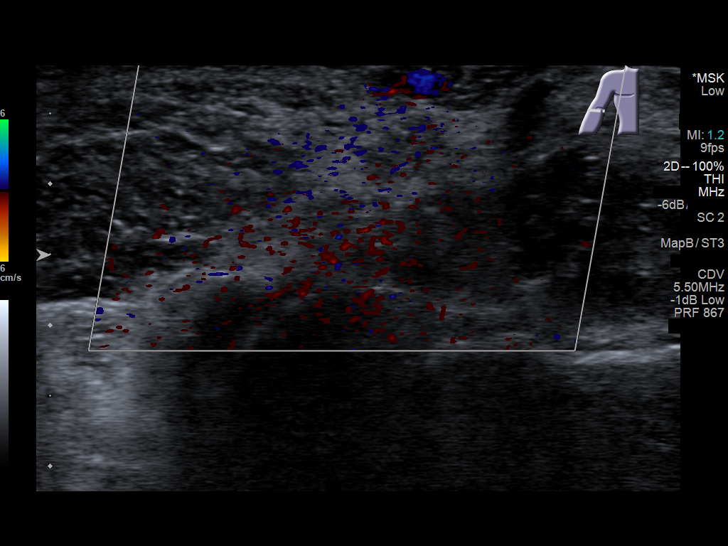
[im 8/10]
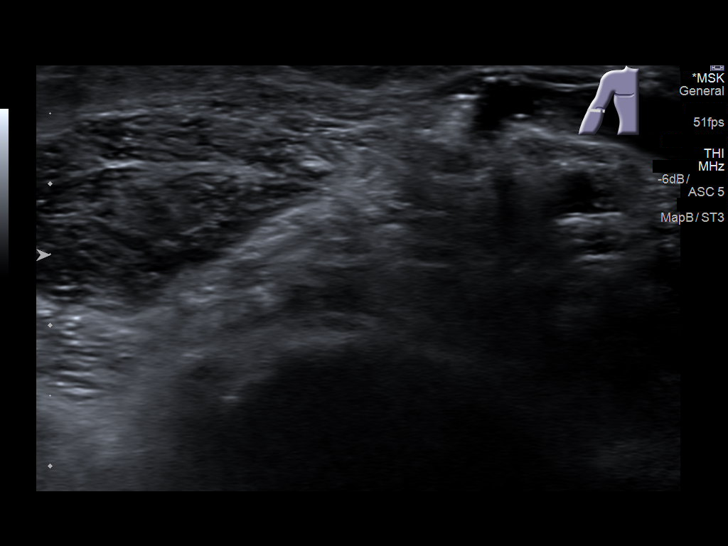
[im 9/10]
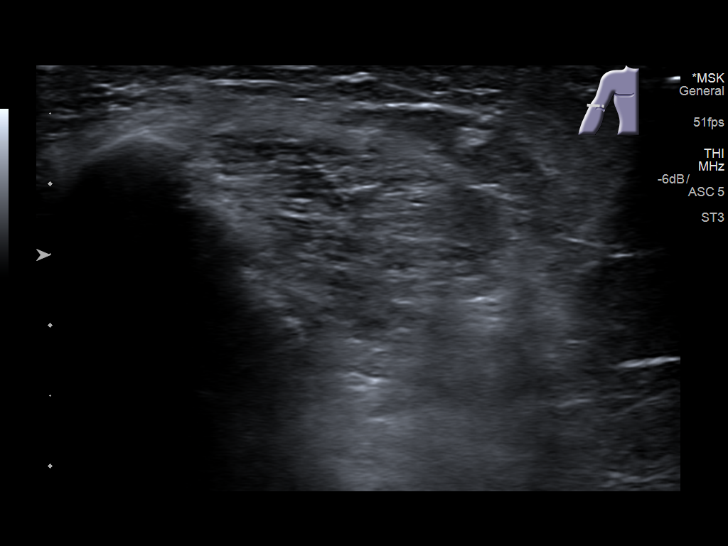
[im 10/10]
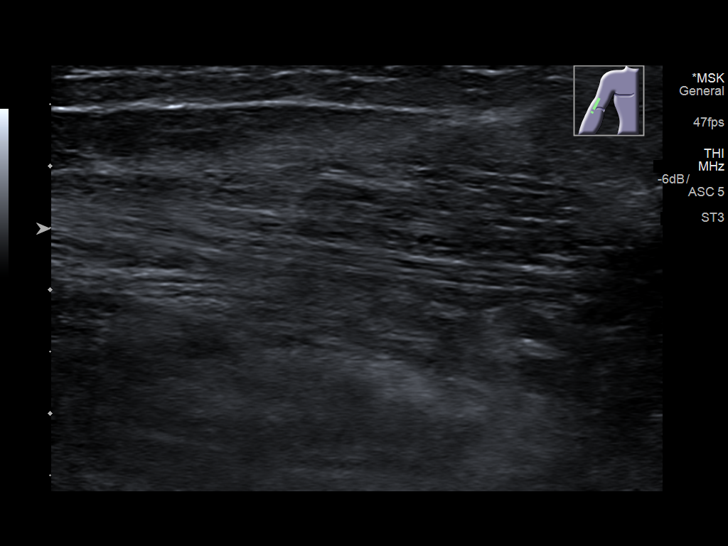

[10 of 10 positions shown; findings below may reference images not displayed]

FINDINGS: In the region of erythema and swelling we demonstrate no mass lesion
or cystic lesion.
IMPRESSION: 1. No specific antecubital lesion is observed sonographically on the
right.

## 2023-10-10 DEATH — deceased
